# Patient Record
Sex: Female | Born: 1950 | Race: Black or African American | Hispanic: No | Smoking: Never smoker
Health system: Southern US, Community
[De-identification: ages and names within clinical notes are randomized; demographics above are authoritative.]

## PROBLEM LIST (undated history)

## (undated) DIAGNOSIS — M1990 Unspecified osteoarthritis, unspecified site: Secondary | ICD-10-CM

## (undated) DIAGNOSIS — I4891 Unspecified atrial fibrillation: Secondary | ICD-10-CM

## (undated) DIAGNOSIS — I509 Heart failure, unspecified: Secondary | ICD-10-CM

## (undated) DIAGNOSIS — E785 Hyperlipidemia, unspecified: Secondary | ICD-10-CM

## (undated) DIAGNOSIS — I10 Essential (primary) hypertension: Secondary | ICD-10-CM

## (undated) HISTORY — DX: Heart failure, unspecified: I50.9

## (undated) SURGERY — Surgical Case
Anesthesia: *Unknown

---

## 2006-08-07 ENCOUNTER — Ambulatory Visit: Payer: Self-pay | Admitting: Family Medicine

## 2007-07-27 ENCOUNTER — Emergency Department: Payer: Self-pay | Admitting: Emergency Medicine

## 2008-06-08 ENCOUNTER — Emergency Department: Payer: Self-pay | Admitting: Emergency Medicine

## 2008-08-18 ENCOUNTER — Emergency Department: Payer: Self-pay | Admitting: Emergency Medicine

## 2008-09-09 ENCOUNTER — Observation Stay: Payer: Self-pay | Admitting: Specialist

## 2008-09-19 ENCOUNTER — Ambulatory Visit: Payer: Self-pay

## 2009-03-11 ENCOUNTER — Emergency Department: Payer: Self-pay | Admitting: Emergency Medicine

## 2009-07-08 ENCOUNTER — Emergency Department: Payer: Self-pay | Admitting: Emergency Medicine

## 2010-02-28 ENCOUNTER — Emergency Department: Payer: Self-pay | Admitting: Emergency Medicine

## 2016-05-01 ENCOUNTER — Inpatient Hospital Stay
Admission: EM | Admit: 2016-05-01 | Discharge: 2016-05-07 | DRG: 280 | Disposition: A | Discharge status: Home / Self Care | Payer: Medicare HMO | Attending: Internal Medicine | Admitting: Internal Medicine

## 2016-05-01 ENCOUNTER — Inpatient Hospital Stay
Admission: EM | Admit: 2016-05-01 | Discharge: 2016-05-01 | Disposition: A | Discharge status: Home / Self Care | Payer: Medicare HMO | Source: Home / Self Care | Attending: Registered Nurse | Admitting: Registered Nurse

## 2016-05-01 ENCOUNTER — Emergency Department: Payer: Medicare HMO

## 2016-05-01 ENCOUNTER — Encounter: Payer: Self-pay | Admitting: Emergency Medicine

## 2016-05-01 ENCOUNTER — Other Ambulatory Visit: Payer: Self-pay

## 2016-05-01 DIAGNOSIS — R0603 Acute respiratory distress: Secondary | ICD-10-CM | POA: Diagnosis present

## 2016-05-01 DIAGNOSIS — Z8249 Family history of ischemic heart disease and other diseases of the circulatory system: Secondary | ICD-10-CM | POA: Diagnosis not present

## 2016-05-01 DIAGNOSIS — I4891 Unspecified atrial fibrillation: Secondary | ICD-10-CM | POA: Diagnosis present

## 2016-05-01 DIAGNOSIS — I110 Hypertensive heart disease with heart failure: Secondary | ICD-10-CM | POA: Diagnosis present

## 2016-05-01 DIAGNOSIS — Z79899 Other long term (current) drug therapy: Secondary | ICD-10-CM | POA: Diagnosis not present

## 2016-05-01 DIAGNOSIS — R000 Tachycardia, unspecified: Secondary | ICD-10-CM | POA: Diagnosis present

## 2016-05-01 DIAGNOSIS — I429 Cardiomyopathy, unspecified: Secondary | ICD-10-CM | POA: Diagnosis present

## 2016-05-01 DIAGNOSIS — Z9114 Patient's other noncompliance with medication regimen: Secondary | ICD-10-CM

## 2016-05-01 DIAGNOSIS — J9601 Acute respiratory failure with hypoxia: Secondary | ICD-10-CM | POA: Diagnosis present

## 2016-05-01 DIAGNOSIS — I447 Left bundle-branch block, unspecified: Secondary | ICD-10-CM | POA: Diagnosis present

## 2016-05-01 DIAGNOSIS — I161 Hypertensive emergency: Secondary | ICD-10-CM | POA: Diagnosis present

## 2016-05-01 DIAGNOSIS — E876 Hypokalemia: Secondary | ICD-10-CM | POA: Diagnosis present

## 2016-05-01 DIAGNOSIS — I214 Non-ST elevation (NSTEMI) myocardial infarction: Principal | ICD-10-CM | POA: Diagnosis present

## 2016-05-01 DIAGNOSIS — E119 Type 2 diabetes mellitus without complications: Secondary | ICD-10-CM | POA: Diagnosis present

## 2016-05-01 DIAGNOSIS — I5021 Acute systolic (congestive) heart failure: Secondary | ICD-10-CM | POA: Diagnosis present

## 2016-05-01 DIAGNOSIS — I2510 Atherosclerotic heart disease of native coronary artery without angina pectoris: Secondary | ICD-10-CM | POA: Diagnosis present

## 2016-05-01 DIAGNOSIS — J101 Influenza due to other identified influenza virus with other respiratory manifestations: Secondary | ICD-10-CM | POA: Diagnosis present

## 2016-05-01 DIAGNOSIS — Z7982 Long term (current) use of aspirin: Secondary | ICD-10-CM

## 2016-05-01 DIAGNOSIS — Z833 Family history of diabetes mellitus: Secondary | ICD-10-CM

## 2016-05-01 DIAGNOSIS — J9801 Acute bronchospasm: Secondary | ICD-10-CM | POA: Diagnosis present

## 2016-05-01 DIAGNOSIS — I509 Heart failure, unspecified: Secondary | ICD-10-CM

## 2016-05-01 HISTORY — DX: Essential (primary) hypertension: I10

## 2016-05-01 HISTORY — DX: Unspecified osteoarthritis, unspecified site: M19.90

## 2016-05-01 LAB — GLUCOSE, CAPILLARY
Glucose-Capillary: 147 mg/dL — ABNORMAL HIGH (ref 65–99)
Glucose-Capillary: 194 mg/dL — ABNORMAL HIGH (ref 65–99)
Glucose-Capillary: 105 mg/dL — ABNORMAL HIGH (ref 65–99)

## 2016-05-01 LAB — BASIC METABOLIC PANEL
Anion gap: 10 (ref 5–15)
BUN: 18 mg/dL (ref 6–20)
CALCIUM: 8.5 mg/dL — AB (ref 8.9–10.3)
CO2: 23 mmol/L (ref 22–32)
CREATININE: 0.94 mg/dL (ref 0.44–1.00)
Chloride: 106 mmol/L (ref 101–111)
GFR calc Af Amer: >60 mL/min (ref >60)
GLUCOSE: 193 mg/dL — AB (ref 65–99)
POTASSIUM: 3.2 mmol/L — AB (ref 3.5–5.1)
SODIUM: 139 mmol/L (ref 135–145)

## 2016-05-01 LAB — URINALYSIS, COMPLETE (UACMP) WITH MICROSCOPIC
BILIRUBIN URINE: NEGATIVE
GLUCOSE, UA: NEGATIVE mg/dL
Hgb urine dipstick: NEGATIVE
KETONES UR: NEGATIVE mg/dL
NITRITE: NEGATIVE
PH: 5 (ref 5.0–8.0)
Protein, ur: NEGATIVE mg/dL
Specific Gravity, Urine: 1.009 (ref 1.005–1.030)

## 2016-05-01 LAB — CBC WITH DIFFERENTIAL/PLATELET
BASOS PCT: 0 %
Basophils Absolute: 0.1 10*3/uL (ref 0–0.1)
EOS ABS: 0 10*3/uL (ref 0–0.7)
EOS PCT: 0 %
HCT: 39.1 % (ref 35.0–47.0)
Hemoglobin: 13.2 g/dL (ref 12.0–16.0)
Lymphocytes Relative: 2 %
Lymphs Abs: 0.3 10*3/uL — ABNORMAL LOW (ref 1.0–3.6)
MCH: 29 pg (ref 26.0–34.0)
MCHC: 33.8 g/dL (ref 32.0–36.0)
MCV: 85.6 fL (ref 80.0–100.0)
MONO ABS: 0.8 10*3/uL (ref 0.2–0.9)
Monocytes Relative: 5 %
Neutro Abs: 15.8 10*3/uL — ABNORMAL HIGH (ref 1.4–6.5)
Neutrophils Relative %: 93 %
PLATELETS: 238 10*3/uL (ref 150–440)
RBC: 4.57 MIL/uL (ref 3.80–5.20)
RDW: 14.5 % (ref 11.5–14.5)
WBC: 16.9 10*3/uL — ABNORMAL HIGH (ref 3.6–11.0)

## 2016-05-01 LAB — PROTIME-INR
INR: 1.13
Prothrombin Time: 14.6 seconds (ref 11.4–15.2)

## 2016-05-01 LAB — ECHOCARDIOGRAM COMPLETE
Height: 66 in
WEIGHTICAEL: 3456 [oz_av]

## 2016-05-01 LAB — HEPARIN LEVEL (UNFRACTIONATED)
HEPARIN UNFRACTIONATED: 0.54 [IU]/mL (ref 0.30–0.70)
Heparin Unfractionated: 0.63 IU/mL (ref 0.30–0.70)

## 2016-05-01 LAB — TROPONIN I
TROPONIN I: 0.19 ng/mL — AB (ref <0.03)
TROPONIN I: 3.51 ng/mL — AB (ref <0.03)
Troponin I: 3.52 ng/mL (ref <0.03)

## 2016-05-01 LAB — BRAIN NATRIURETIC PEPTIDE: B Natriuretic Peptide: 367 pg/mL — ABNORMAL HIGH (ref 0.0–100.0)

## 2016-05-01 LAB — APTT: APTT: 32 s (ref 24–36)

## 2016-05-01 LAB — MAGNESIUM: Magnesium: 1.6 mg/dL — ABNORMAL LOW (ref 1.7–2.4)

## 2016-05-01 MED ORDER — NITROGLYCERIN 2 % TD OINT
TOPICAL_OINTMENT | TRANSDERMAL | Status: AC
  Administered 2016-05-01: 0.5 [in_us] via TOPICAL
  Filled 2016-05-01: qty 1

## 2016-05-01 MED ORDER — NITROGLYCERIN 2 % TD OINT
0.5000 [in_us] | TOPICAL_OINTMENT | Freq: Once | TRANSDERMAL | Status: AC
Start: 2016-05-01 — End: 2016-05-01
  Administered 2016-05-01: 0.5 [in_us] via TOPICAL

## 2016-05-01 MED ORDER — SODIUM CHLORIDE 0.9 % IV SOLN: 250.0000 mL | INTRAVENOUS | Status: DC | PRN

## 2016-05-01 MED ORDER — ACETAMINOPHEN 650 MG RE SUPP: 650.0000 mg | Freq: Four times a day (QID) | RECTAL | Status: DC | PRN

## 2016-05-01 MED ORDER — DILTIAZEM HCL 25 MG/5ML IV SOLN: 10.0000 mg | Freq: Once | INTRAVENOUS | Status: DC

## 2016-05-01 MED ORDER — ONDANSETRON HCL 4 MG/2ML IJ SOLN: 4.0000 mg | Freq: Four times a day (QID) | INTRAMUSCULAR | Status: DC | PRN

## 2016-05-01 MED ORDER — FUROSEMIDE 10 MG/ML IJ SOLN
40.0000 mg | Freq: Once | INTRAMUSCULAR | Status: AC
  Administered 2016-05-01: 40 mg via INTRAVENOUS
  Filled 2016-05-01: qty 4

## 2016-05-01 MED ORDER — POTASSIUM CHLORIDE CRYS ER 20 MEQ PO TBCR
40.0000 meq | EXTENDED_RELEASE_TABLET | Freq: Once | ORAL | Status: AC
  Administered 2016-05-01: 40 meq via ORAL
  Filled 2016-05-01: qty 2

## 2016-05-01 MED ORDER — FUROSEMIDE 10 MG/ML IJ SOLN
40.0000 mg | Freq: Two times a day (BID) | INTRAMUSCULAR | Status: DC
  Administered 2016-05-01 – 2016-05-03 (×3): 40 mg via INTRAVENOUS
  Filled 2016-05-01 (×5): qty 4

## 2016-05-01 MED ORDER — HYDROCODONE-ACETAMINOPHEN 5-325 MG PO TABS: 1.0000 | ORAL_TABLET | ORAL | Status: DC | PRN

## 2016-05-01 MED ORDER — INSULIN ASPART 100 UNIT/ML ~~LOC~~ SOLN
0.0000 [IU] | Freq: Three times a day (TID) | SUBCUTANEOUS | Status: DC
Start: 2016-05-01 — End: 2016-05-07
  Administered 2016-05-01 – 2016-05-03 (×2): 2 [IU] via SUBCUTANEOUS
  Administered 2016-05-03: 3 [IU] via SUBCUTANEOUS
  Administered 2016-05-04 – 2016-05-05 (×3): 2 [IU] via SUBCUTANEOUS
  Administered 2016-05-05: 3 [IU] via SUBCUTANEOUS
  Filled 2016-05-01 (×3): qty 2
  Filled 2016-05-01: qty 3
  Filled 2016-05-01: qty 2
  Filled 2016-05-01 (×2): qty 3

## 2016-05-01 MED ORDER — BISACODYL 5 MG PO TBEC: 5.0000 mg | DELAYED_RELEASE_TABLET | Freq: Every day | ORAL | Status: DC | PRN

## 2016-05-01 MED ORDER — HEPARIN (PORCINE) IN NACL 100-0.45 UNIT/ML-% IJ SOLN
900.0000 [IU]/h | INTRAMUSCULAR | Status: DC
  Administered 2016-05-01 – 2016-05-02 (×2): 1150 [IU]/h via INTRAVENOUS
  Administered 2016-05-05 – 2016-05-06 (×2): 900 [IU]/h via INTRAVENOUS
  Filled 2016-05-01 (×6): qty 250

## 2016-05-01 MED ORDER — METOPROLOL SUCCINATE ER 25 MG PO TB24
25.0000 mg | ORAL_TABLET | Freq: Every day | ORAL | Status: DC
  Administered 2016-05-01 – 2016-05-02 (×2): 25 mg via ORAL
  Filled 2016-05-01 (×2): qty 1

## 2016-05-01 MED ORDER — INSULIN ASPART 100 UNIT/ML ~~LOC~~ SOLN
0.0000 [IU] | Freq: Every day | SUBCUTANEOUS | Status: DC
Start: 2016-05-01 — End: 2016-05-07

## 2016-05-01 MED ORDER — HEPARIN BOLUS VIA INFUSION
4000.0000 [IU] | Freq: Once | INTRAVENOUS | Status: AC
  Administered 2016-05-01: 4000 [IU] via INTRAVENOUS
  Filled 2016-05-01: qty 4000

## 2016-05-01 MED ORDER — SODIUM CHLORIDE 0.9% FLUSH: 3.0000 mL | INTRAVENOUS | Status: DC | PRN

## 2016-05-01 MED ORDER — SODIUM CHLORIDE 0.9% FLUSH
3.0000 mL | Freq: Two times a day (BID) | INTRAVENOUS | Status: DC
  Administered 2016-05-01 – 2016-05-07 (×8): 3 mL via INTRAVENOUS

## 2016-05-01 MED ORDER — ONDANSETRON HCL 4 MG PO TABS: 4.0000 mg | ORAL_TABLET | Freq: Four times a day (QID) | ORAL | Status: DC | PRN

## 2016-05-01 MED ORDER — ALBUTEROL SULFATE (2.5 MG/3ML) 0.083% IN NEBU: 2.5000 mg | INHALATION_SOLUTION | RESPIRATORY_TRACT | Status: DC | PRN

## 2016-05-01 MED ORDER — SODIUM CHLORIDE 0.9% FLUSH
3.0000 mL | Freq: Two times a day (BID) | INTRAVENOUS | Status: DC
  Administered 2016-05-01 – 2016-05-07 (×9): 3 mL via INTRAVENOUS

## 2016-05-01 MED ORDER — GUAIFENESIN-DM 100-10 MG/5ML PO SYRP
5.0000 mL | ORAL_SOLUTION | ORAL | Status: DC | PRN
  Administered 2016-05-01 – 2016-05-05 (×4): 5 mL via ORAL
  Filled 2016-05-01 (×4): qty 5

## 2016-05-01 MED ORDER — SENNOSIDES-DOCUSATE SODIUM 8.6-50 MG PO TABS: 1.0000 | ORAL_TABLET | Freq: Every evening | ORAL | Status: DC | PRN

## 2016-05-01 MED ORDER — KETOROLAC TROMETHAMINE 15 MG/ML IJ SOLN: 15.0000 mg | Freq: Four times a day (QID) | INTRAMUSCULAR | Status: DC | PRN

## 2016-05-01 MED ORDER — LISINOPRIL 10 MG PO TABS
10.0000 mg | ORAL_TABLET | Freq: Every day | ORAL | Status: DC
  Administered 2016-05-01 – 2016-05-06 (×6): 10 mg via ORAL
  Filled 2016-05-01 (×6): qty 1

## 2016-05-01 MED ORDER — HYDRALAZINE HCL 20 MG/ML IJ SOLN: 10.0000 mg | Freq: Four times a day (QID) | INTRAMUSCULAR | Status: DC | PRN

## 2016-05-01 MED ORDER — ACETAMINOPHEN 325 MG PO TABS
650.0000 mg | ORAL_TABLET | Freq: Four times a day (QID) | ORAL | Status: DC | PRN
Start: 2016-05-01 — End: 2016-05-06
  Administered 2016-05-02: 650 mg via ORAL

## 2016-05-01 MED ORDER — HYDRALAZINE HCL 50 MG PO TABS
50.0000 mg | ORAL_TABLET | Freq: Three times a day (TID) | ORAL | Status: DC
  Administered 2016-05-01 – 2016-05-03 (×7): 50 mg via ORAL
  Filled 2016-05-01 (×7): qty 1

## 2016-05-01 NOTE — ED Notes (Signed)
Pt leaving Emergency department at this time. Tech and RN with pt. Family following pt to room. Belongings with family.

## 2016-05-01 NOTE — Progress Notes (Deleted)
ANTICOAGULATION CONSULT NOTE - Initial Consult  Pharmacy Consult for heparin drip  Indication: atrial fibrillation  No Known Allergies  Patient Measurements: Height: 5\' 6"  (167.6 cm) Weight: 216 lb (98 kg) IBW/kg (Calculated) : 59.3  Height: 5 foot 6 inches per RN/patient Heparin Dosing Weight: 81 kg  Vital Signs: Temp: 98.7 F (37.1 C) (03/14 1100) Temp Source: Oral (03/14 1100) BP: 159/81 (03/14 1708) Pulse Rate: 98 (03/14 1708)  Labs:  Recent Labs  05/01/16 0616 05/01/16 0715 05/01/16 0930 05/01/16 1241 05/01/16 1621  HGB  --  13.2  --   --   --   HCT  --  39.1  --   --   --   PLT  --  238  --   --   --   APTT  --   --  32  --   --   LABPROT  --   --  14.6  --   --   INR  --   --  1.13  --   --   HEPARINUNFRC  --   --   --   --  0.54  CREATININE 0.94  --   --   --   --   TROPONINI 0.19*  --   --  3.51*  --     Estimated Creatinine Clearance: 70.5 mL/min (by C-G formula based on SCr of 0.94 mg/dL).   Medical History: Past Medical History:  Diagnosis Date  . Arthritis   . Hypertension    Medications:  Prescriptions Prior to Admission  Medication Sig Dispense Refill Last Dose  . aspirin EC 81 MG tablet Take 81 mg by mouth daily.   04/30/2016 at am  . olmesartan-hydrochlorothiazide (BENICAR HCT) 40-25 MG tablet Take 1 tablet by mouth daily.   Past Month at Unknown time   Scheduled:  . diltiazem  10 mg Intravenous Once  . furosemide  40 mg Intravenous BID  . hydrALAZINE  50 mg Oral Q8H  . insulin aspart  0-5 Units Subcutaneous QHS  . insulin aspart  0-9 Units Subcutaneous TID WC  . lisinopril  10 mg Oral Daily  . metoprolol succinate  25 mg Oral Daily  . sodium chloride flush  3 mL Intravenous Q12H  . sodium chloride flush  3 mL Intravenous Q12H   Infusions:  . heparin 1,150 Units/hr (05/01/16 1027)    Assessment: Pharmacy consulted to dose and monitor heparin drip in this 66 year old female admitted with new onset atrial fibrillation. Obtained  patient's height from RN/patient. Patient was not taking anticoagulation prior to admission.  Goal of Therapy:  Heparin level 0.3-0.7 units/ml Monitor platelets by anticoagulation protocol: Yes   Plan:  Give 4000 units bolus x 1 Start heparin infusion at 1150 units/hr Check anti-Xa level in 6 hours and daily while on heparin Continue to monitor H&H and platelets  3/14 1638 HL therapeutic x 1. Continue current rate. Will recheck HL in 6 hours.  Laural Benes, Pharm.D., BCPS Clinical Pharmacist 05/01/2016,5:09 PM

## 2016-05-01 NOTE — ED Triage Notes (Signed)
Patient comes in from home via [facility] for difficulty breathing. On Arrival EMS got 70% Ra, after 3 albuterol breathing treatments 80's%. Patient was placed on CPAP 95%. Patient also got Solumedrol 125mg  en route also. Patient also hypertensive 219/140's per EMS. Patient has hx of htn. Denies hx of lung problems, CHF.

## 2016-05-01 NOTE — Progress Notes (Signed)
ANTICOAGULATION CONSULT NOTE - Initial Consult  Pharmacy Consult for heparin drip  Indication: atrial fibrillation  No Known Allergies  Patient Measurements: Weight: 216 lb (98 kg)  Height: 5 foot 6 inches per RN/patient Heparin Dosing Weight: 81 kg  Vital Signs: Temp: 98.6 F (37 C) (03/14 0936) Temp Source: Oral (03/14 0936) BP: 155/88 (03/14 0936) Pulse Rate: 93 (03/14 0936)  Labs:  Recent Labs  05/01/16 0616 05/01/16 0715  HGB  --  13.2  HCT  --  39.1  PLT  --  238  CREATININE 0.94  --   TROPONINI 0.19*  --     CrCl cannot be calculated (Unknown ideal weight.).   Medical History: Past Medical History:  Diagnosis Date  . Arthritis   . Hypertension    Medications:  Prescriptions Prior to Admission  Medication Sig Dispense Refill Last Dose  . aspirin EC 81 MG tablet Take 81 mg by mouth daily.   04/30/2016 at am  . olmesartan-hydrochlorothiazide (BENICAR HCT) 40-25 MG tablet Take 1 tablet by mouth daily.   Past Month at Unknown time   Scheduled:  . diltiazem  10 mg Intravenous Once  . furosemide  40 mg Intravenous BID  . heparin  4,000 Units Intravenous Once  . insulin aspart  0-5 Units Subcutaneous QHS  . insulin aspart  0-9 Units Subcutaneous TID WC  . lisinopril  10 mg Oral Daily  . metoprolol succinate  25 mg Oral Daily  . potassium chloride  40 mEq Oral Once  . sodium chloride flush  3 mL Intravenous Q12H  . sodium chloride flush  3 mL Intravenous Q12H   Infusions:  . heparin      Assessment: Pharmacy consulted to dose and monitor heparin drip in this 66 year old female admitted with new onset atrial fibrillation. Obtained patient's height from RN/patient. Patient was not taking anticoagulation prior to admission.  Goal of Therapy:  Heparin level 0.3-0.7 units/ml Monitor platelets by anticoagulation protocol: Yes   Plan:  Give 4000 units bolus x 1 Start heparin infusion at 1150 units/hr Check anti-Xa level in 6 hours and daily while on  heparin Continue to monitor H&H and platelets  Lenis Noon, PharmD Clinical Pharmacist 05/01/2016,9:59 AM

## 2016-05-01 NOTE — Progress Notes (Signed)
Troponin went up to 3.51.  Dr. Humphrey Rolls notified.  No new orders.  Patient on heparin gtt and is chest pain free.

## 2016-05-01 NOTE — H&P (Signed)
[place] [facility] [place] NAME: Mackenzie [name]    MR#:  [id number]  DATE OF BIRTH:  Jul 01, 1950  DATE OF ADMISSION:  05/01/2016  PRIMARY CARE PHYSICIAN: No PCP Per Patient   REQUESTING/REFERRING PHYSICIAN: Gregor Hams, MD  CHIEF COMPLAINT:   Chief Complaint  Patient presents with  . Respiratory Distress   Dyspnea for 2 months, worsening today. HISTORY OF PRESENT ILLNESS:  Mackenzie [name]  is a 66 y.o. female with a known history of Hypertension not on hypertension medication. She said that she has had dyspnea on exertion for the past 2-3 months. Shortness breasts has been worsening today. She also complains of cough, wheezing, orthopnea and nocturnal dyspnea. She denies any chest pain or leg swelling. She was found hypoxia and put on BiPAP in the ED. Chest x-ray show pulmonary edema. She was treated with Lasix one dose in the ED, she is off BiPAP now, on oxygen by nasal cannula.  PAST MEDICAL HISTORY:   Past Medical History:  Diagnosis Date  . Arthritis   . Hypertension     PAST SURGICAL HISTORY:  History reviewed. No pertinent surgical history. no.  SOCIAL HISTORY:   Social History  Substance Use Topics  . Smoking status: Never Smoker  . Smokeless tobacco: Never Used  . Alcohol use No    FAMILY HISTORY:   Family History  Problem Relation Age of Onset  . Diabetes Mother   . Heart disease Mother   . Heart attack Father   . Diabetes Brother   . Breast cancer Maternal Aunt   . Breast cancer Maternal Grandmother     DRUG ALLERGIES:  No Known Allergies  REVIEW OF SYSTEMS:   Review of Systems  Constitutional: Positive for malaise/fatigue. Negative for chills, fever and weight loss.  HENT: Negative for congestion and sore throat.   Eyes: Negative for blurred vision and double vision.  Respiratory: Positive for cough, shortness of breath and wheezing. Negative for hemoptysis, sputum production and stridor.   Cardiovascular:  Positive for palpitations and orthopnea. Negative for chest pain and leg swelling.  Gastrointestinal: Negative for abdominal pain, blood in stool, constipation, diarrhea, melena, nausea and vomiting.  Genitourinary: Negative for dysuria, hematuria and urgency.  Musculoskeletal: Negative for joint pain.  Skin: Negative for itching and rash.  Neurological: Positive for weakness. Negative for dizziness, focal weakness and loss of consciousness.  Psychiatric/Behavioral: Negative for depression. The patient is not nervous/anxious.     MEDICATIONS AT HOME:   Prior to Admission medications   Medication Sig Start Date End Date Taking? Authorizing Provider  aspirin EC 81 MG tablet Take 81 mg by mouth daily.   Yes Historical Provider, MD  olmesartan-hydrochlorothiazide (BENICAR HCT) 40-25 MG tablet Take 1 tablet by mouth daily.   Yes Historical Provider, MD      VITAL SIGNS:  Blood pressure (!) 162/91, pulse 100, resp. rate (!) 29, weight 216 lb (98 kg), SpO2 95 %.  PHYSICAL EXAMINATION:  Physical Exam  Constitutional: She is oriented to person, place, and time and well-developed, well-nourished, and in no distress.  HENT:  Head: Normocephalic.  Mouth/Throat: Oropharynx is clear and moist.  Eyes: Conjunctivae and EOM are normal.  Neck: Neck supple. No JVD present. No tracheal deviation present.  Cardiovascular: Normal rate, regular rhythm and normal heart sounds.  Exam reveals no gallop.   No murmur heard. Pulmonary/Chest: Effort normal. No respiratory distress. She has no wheezes. She has rales.  Abdominal: Soft. Bowel  sounds are normal. She exhibits no distension. There is no tenderness.  Musculoskeletal: Normal range of motion. She exhibits edema.  Neurological: She is alert and oriented to person, place, and time. No cranial nerve deficit.  Skin: No rash noted. No erythema.  Psychiatric: Affect and judgment normal.   LABORATORY PANEL:   CBC  Recent Labs Lab 05/01/16 0715  WBC  16.9*  HGB 13.2  HCT 39.1  PLT 238   ------------------------------------------------------------------------------------------------------------------  Chemistries   Recent Labs Lab 05/01/16 0616  NA 139  K 3.2*  CL 106  CO2 23  GLUCOSE 193*  BUN 18  CREATININE 0.94  CALCIUM 8.5*   ------------------------------------------------------------------------------------------------------------------  Cardiac Enzymes  Recent Labs Lab 05/01/16 0616  TROPONINI 0.19*   ------------------------------------------------------------------------------------------------------------------  RADIOLOGY:  Dg Chest Port 1 View  Result Date: 05/01/2016 CLINICAL DATA:  Difficulty breathing, hypoxic. History of hypertension, CHF. EXAM: PORTABLE CHEST 1 VIEW COMPARISON:  Chest radiograph September 09, 2008 FINDINGS: Cardiac silhouette is mild to moderately enlarged, increased from prior examination. Pulmonary vascular congestion and interstitial prominence. Small RIGHT pleural effusion. No pneumothorax. Degenerative change of the thoracic spine. Marked spurring LEFT humeral head. IMPRESSION: Cardiomegaly and findings of interstitial edema with small pleural effusions. Electronically Signed   By: Elon Alas M.D.   On: 05/01/2016 06:43      IMPRESSION AND PLAN:   Acute respiratory failure with hypoxia due to acute CHF. The patient will be admitted to telemetry floor. Nebulizer when necessary. Start CHF protocol, Lasix 40 mg IV twice a day, echocardiogram, cardiology consult.  A. fib with RVR. New-onset. I will start heparin drip, start Lopressor, Cardizem IV 1 dose now, echocardiogram and cardiology consult.  Accelerated hypertension. Start Lopressor and lisinopril, IV hydralazine when necessary.  Elevated troponin, due to above. Start heparin drip, aspirin and Lipitor.  Hypokalemia. Give potassium supplement and check magnesium level.  Leukocytosis. Unclear etiology. Possible due to  reaction. Check urinalysis, Follow-up CBC.  History of diabetes. Check hemoglobin A1c and lipid panel, and start sliding scale.  All the records are reviewed and case discussed with ED provider. Management plans discussed with the patient, family and they are in agreement.  CODE STATUS: Full code  TOTAL TIME TAKING CARE OF THIS PATIENT: 58 minutes.    Demetrios Loll M.D on 05/01/2016 at 7:59 AM  Between 7am to 6pm - Pager - [phone]  After 6pm go to [email or web address] - [employer] [facility]  Office  [phone]  CC: Primary care physician; No PCP Per Patient   Note: This dictation was prepared with Dragon dictation along with smaller phrase technology. Any transcriptional errors that result from this process are unintentional.

## 2016-05-01 NOTE — Care Management Note (Signed)
Case Management Note  Patient Details  Name: LUCCIANA HEAD MRN: [id number] Date of Birth: [birth year]  Subjective/Objective:          Admitted from home with CHF and new onset of atrial fib.  Current 02 requirements acute. Referral to HF clinic.          Action/Plan:   Expected Discharge Date:                  Expected Discharge Plan:     In-House Referral:     Discharge planning Services     Post Acute Care Choice:    Choice offered to:     DME Arranged:    DME Agency:     HH Arranged:    HH Agency:     Status of Service:     If discussed at [employer] of Stay Meetings, dates discussed:    Additional Comments:  Katrina Stack, RN 05/01/2016, 11:38 AM

## 2016-05-01 NOTE — ED Notes (Signed)
Pt assisted to bedside commode. Family at bedside.

## 2016-05-01 NOTE — Progress Notes (Signed)
Mackenzie [name] is a 66 y.o. female  [id number]  Primary Cardiologist:Dr. Neoma Laming   Reason for Consultation: Elevated troponin  HPI: 66yo black female presented to ER with severe dyspnea. She reported she was having worsening dyspnea on exertion over the past 2-62months. She was placed on CPAP. She has a history of uncontrolled hypertension and was not taking her BP medication. Troponin was elevated to 0.19 in the ED and was admitted for observation and treatment of HTN.    Review of Systems: No chest pain. Shortness of breath his improved significantly. Remains on nasal cannula.   Past Medical History:  Diagnosis Date  . Arthritis   . Hypertension     Medications Prior to Admission  Medication Sig Dispense Refill  . aspirin EC 81 MG tablet Take 81 mg by mouth daily.    Marland Kitchen olmesartan-hydrochlorothiazide (BENICAR HCT) 40-25 MG tablet Take 1 tablet by mouth daily.       Marland Kitchen diltiazem  10 mg Intravenous Once  . furosemide  40 mg Intravenous BID  . insulin aspart  0-5 Units Subcutaneous QHS  . insulin aspart  0-9 Units Subcutaneous TID WC  . lisinopril  10 mg Oral Daily  . metoprolol succinate  25 mg Oral Daily  . potassium chloride  40 mEq Oral Once  . sodium chloride flush  3 mL Intravenous Q12H  . sodium chloride flush  3 mL Intravenous Q12H    Infusions:   No Known Allergies  Social History   Social History  . Marital status: Widowed    Spouse name: N/A  . Number of children: N/A  . Years of education: N/A   Occupational History  . Not on file.   Social History Main Topics  . Smoking status: Never Smoker  . Smokeless tobacco: Never Used  . Alcohol use No  . Drug use: No  . Sexual activity: Not on file   Other Topics Concern  . Not on file   Social History Narrative  . No narrative on file    Family History  Problem Relation Age of Onset  . Diabetes Mother   . Heart disease Mother   . Heart attack Father   . Diabetes Brother   . Breast cancer  Maternal Aunt   . Breast cancer Maternal Grandmother     PHYSICAL EXAM: Vitals:   05/01/16 0845 05/01/16 0936  BP: (!) 182/84 (!) 155/88  Pulse: 100 93  Resp: (!) 29   Temp:  98.6 F (37 C)    No intake or output data in the 24 hours ending 05/01/16 0937  General:  Well appearing. Mild shortness of breath on nasal canual.  HEENT: normal Neck: supple. no JVD. Carotids 2+ bilat; no bruits. No lymphadenopathy or thryomegaly appreciated. Cor: PMI nondisplaced. Regular rate & rhythm. No rubs, gallops or murmurs. Lungs: Congested bilaterally.  Abdomen: soft, nontender, nondistended. No hepatosplenomegaly. No bruits or masses. Good bowel sounds. Extremities: no cyanosis, clubbing, rash, edema Neuro: alert & oriented x 3, cranial nerves grossly intact. moves all 4 extremities w/o difficulty. Affect pleasant.  ECG: NSR 90bpm  Results for orders placed or performed during the hospital encounter of 05/01/16 (from the past 24 hour(s))  Basic metabolic panel     Status: Abnormal   Collection Time: 05/01/16  6:16 AM  Result Value Ref Range   Sodium 139 135 - 145 mmol/L   Potassium 3.2 (L) 3.5 - 5.1 mmol/L   Chloride 106 101 - 111 mmol/L  CO2 23 22 - 32 mmol/L   Glucose, Bld 193 (H) 65 - 99 mg/dL   BUN 18 6 - 20 mg/dL   Creatinine, Ser 0.94 0.44 - 1.00 mg/dL   Calcium 8.5 (L) 8.9 - 10.3 mg/dL   GFR calc non Af Amer >60 >60 mL/min   GFR calc Af Amer >60 >60 mL/min   Anion gap 10 5 - 15  Troponin I     Status: Abnormal   Collection Time: 05/01/16  6:16 AM  Result Value Ref Range   Troponin I 0.19 (HH) <0.03 ng/mL  CBC with Differential     Status: Abnormal   Collection Time: 05/01/16  7:15 AM  Result Value Ref Range   WBC 16.9 (H) 3.6 - 11.0 K/uL   RBC 4.57 3.80 - 5.20 MIL/uL   Hemoglobin 13.2 12.0 - 16.0 g/dL   HCT 39.1 35.0 - 47.0 %   MCV 85.6 80.0 - 100.0 fL   MCH 29.0 26.0 - 34.0 pg   MCHC 33.8 32.0 - 36.0 g/dL   RDW 14.5 11.5 - 14.5 %   Platelets 238 150 - 440 K/uL    Neutrophils Relative % 93 %   Neutro Abs 15.8 (H) 1.4 - 6.5 K/uL   Lymphocytes Relative 2 %   Lymphs Abs 0.3 (L) 1.0 - 3.6 K/uL   Monocytes Relative 5 %   Monocytes Absolute 0.8 0.2 - 0.9 K/uL   Eosinophils Relative 0 %   Eosinophils Absolute 0.0 0 - 0.7 K/uL   Basophils Relative 0 %   Basophils Absolute 0.1 0 - 0.1 K/uL  Brain natriuretic peptide     Status: Abnormal   Collection Time: 05/01/16  7:15 AM  Result Value Ref Range   B Natriuretic Peptide 367.0 (H) 0.0 - 100.0 pg/mL   Dg Chest Port 1 View  Result Date: 05/01/2016 CLINICAL DATA:  Difficulty breathing, hypoxic. History of hypertension, CHF. EXAM: PORTABLE CHEST 1 VIEW COMPARISON:  Chest radiograph September 09, 2008 FINDINGS: Cardiac silhouette is mild to moderately enlarged, increased from prior examination. Pulmonary vascular congestion and interstitial prominence. Small RIGHT pleural effusion. No pneumothorax. Degenerative change of the thoracic spine. Marked spurring LEFT humeral head. IMPRESSION: Cardiomegaly and findings of interstitial edema with small pleural effusions. Electronically Signed   By: Elon Alas M.D.   On: 05/01/2016 06:43     ASSESSMENT AND PLAN: New onset acute congestive heart failure likely due to chronic uncontrolled hypertension. BNP is 367.  Troponin elevation is likely due to demand ischemia but will continue to monitor troponin.  Echo to evaluate heart function.  Lasix as ordered.  Metoprolol as ordered.  Start PO hydralazine as ordered.  May allow patient to eat and drink. Will continue to monitor patients status.    Jake Bathe

## 2016-05-01 NOTE — ED Provider Notes (Signed)
[facility] Emergency Department Provider Note   First MD Initiated Contact with Patient 05/01/16 [phone]     (approximate)  I have reviewed the triage vital signs and the nursing notes.   HISTORY  Chief Complaint Respiratory Distress    HPI Mackenzie [name] is a 67 y.o. female with history of uncontrolled hypertension presents to the emergency department  via EMS with respiratory distress CPAP in place. Per EMS on arrival patient's oxygen saturation was 70s". EMS stated that the patient's initial blood pressure was 219/140. Patient states that difficulty breathing began tonight. Patient denies any chest pain. Patient denies any lower extremity pain or swelling. Patient admits to known history of hypertension and admits to being noncompliant with medication.   Past Medical History:  Diagnosis Date  . Hypertension     There are no active problems to display for this patient.  Past surgical history No pertinent past surgical history  Prior to Admission medications   Not on File    Allergies No known drug allergies No family history on file.  Social History Social History  Substance Use Topics  . Smoking status: Never Smoker  . Smokeless tobacco: Never Used  . Alcohol use No    Review of Systems Constitutional: No fever/chills Eyes: No visual changes. ENT: No sore throat. Cardiovascular: Denies chest pain. Respiratory: Positive for shortness of breath. Gastrointestinal: No abdominal pain.  No nausea, no vomiting.  No diarrhea.  No constipation. Genitourinary: Negative for dysuria. Musculoskeletal: Negative for back pain. Skin: Negative for rash. Neurological: Negative for headaches, focal weakness or numbness.  10-point ROS otherwise negative.  ____________________________________________   PHYSICAL EXAM:  VITAL SIGNS: ED Triage Vitals  Enc Vitals Group     BP --      Pulse Rate 05/01/16 0615 (!) 132     Resp 05/01/16 0626 (!) 39       Temp --      Temp src --      SpO2 05/01/16 0615 98 %     Weight 05/01/16 0616 216 lb (98 kg)     Height --      Head Circumference --      Peak Flow --      Pain Score --      Pain Loc --      Pain Edu? --      Excl. in Onset? --     Constitutional: Alert and oriented. Apparent respiratory distress Eyes: Conjunctivae are normal. PERRL. EOMI. Head: Atraumatic. Mouth/Throat: Mucous membranes are moist.  Oropharynx non-erythematous. Neck: No stridor.   Cardiovascular: Tachycardia, regular rhythm. Good peripheral circulation. Grossly normal heart sounds. Respiratory: Tachypnea, positive accessory rest or muscle use, speaking in 3 word phrases. Bibasilar rales Gastrointestinal: Soft and nontender. No distention.  Musculoskeletal: No lower extremity tenderness nor edema. No gross deformities of extremities. Neurologic:  Normal speech and language. No gross focal neurologic deficits are appreciated.  Skin:  Skin is warm, dry and intact. No rash noted. Psychiatric: Anxious affect..   ____________________________________________   LABS (all labs ordered are listed, but only abnormal results are displayed)  Labs Reviewed  CBC WITH DIFFERENTIAL/PLATELET  BASIC METABOLIC PANEL  TROPONIN I   ____________________________________________  EKG  ED ECG REPORT I, [place] BR[place], the attending physician, personally viewed and interpreted this ECG.   Date: 05/01/2016  EKG Time: 6:26 AM  Rate: 120  Rhythm: Sinus tachycardia with left bundle branch block and LVH Intervals: Normal  ST&T Change: None  ____________________________________________  RADIOLOGY I, Gregor Hams, personally viewed and evaluated these images (plain radiographs) as part of my medical decision making, as well as reviewing the written report by the radiologist.  Dg Chest Port 1 View  Result Date: 05/01/2016 CLINICAL DATA:  Difficulty breathing, hypoxic. History of hypertension, CHF. EXAM: PORTABLE  CHEST 1 VIEW COMPARISON:  Chest radiograph September 09, 2008 FINDINGS: Cardiac silhouette is mild to moderately enlarged, increased from prior examination. Pulmonary vascular congestion and interstitial prominence. Small RIGHT pleural effusion. No pneumothorax. Degenerative change of the thoracic spine. Marked spurring LEFT humeral head. IMPRESSION: Cardiomegaly and findings of interstitial edema with small pleural effusions. Electronically Signed   By: Elon Alas M.D.   On: 05/01/2016 06:43     Procedures   Critical Care performed: CRITICAL CARE Performed by: Gregor Hams   Total critical care time: 40 minutes  Critical care time was exclusive of separately billable procedures and treating other patients.  Critical care was necessary to treat or prevent imminent or life-threatening deterioration.  Critical care was time spent personally by me on the following activities: development of treatment plan with patient and/or surrogate as well as nursing, discussions with consultants, evaluation of patient's response to treatment, examination of patient, obtaining history from patient or surrogate, ordering and performing treatments and interventions, ordering and review of laboratory studies, ordering and review of radiographic studies, pulse oximetry and re-evaluation of patient's condition.  ____________________________________________   INITIAL IMPRESSION / ASSESSMENT AND PLAN / ED COURSE  Pertinent labs & imaging results that were available during my care of the patient were reviewed by me and considered in my medical decision making (see chart for details).  Given history of physical exam BiPAP applied to the patient on arrival to the emergency department, Lasix 40 mg IV given, nitroglycerin ointment half inch applied.  Patient's blood pressure improved at this time systolic blood pressure 867. Respiratory rate, F for markedly improved. As such BiPAP will be removed at this time   Patient discussed with Dr. Marcille Blanco for hospital admission         ____________________________________________  FINAL CLINICAL IMPRESSION(S) / ED DIAGNOSES  Final diagnoses:  Hypertensive emergency  Respiratory distress  Left bundle branch block     MEDICATIONS GIVEN DURING THIS VISIT:  Medications  nitroGLYCERIN (NITROGLYN) 2 % ointment 0.5 inch (0.5 inches Topical Given 05/01/16 0610)  furosemide (LASIX) injection 40 mg (40 mg Intravenous Given 05/01/16 0641)     NEW OUTPATIENT MEDICATIONS STARTED DURING THIS VISIT:  New Prescriptions   No medications on file    Modified Medications   No medications on file    Discontinued Medications   No medications on file     Note:  This document was prepared using Dragon voice recognition software and may include unintentional dictation errors.    Gregor Hams, MD 05/01/16 [phone]

## 2016-05-01 NOTE — Progress Notes (Signed)
*  PRELIMINARY RESULTS* Echocardiogram 2D Echocardiogram has been performed.  Sherrie Sport 05/01/2016, 1:26 PM

## 2016-05-01 NOTE — Progress Notes (Signed)
ANTICOAGULATION CONSULT NOTE - Initial Consult  Pharmacy Consult for heparin drip  Indication: atrial fibrillation  No Known Allergies  Patient Measurements: Height: 5\' 6"  (167.6 cm) Weight: 216 lb (98 kg) IBW/kg (Calculated) : 59.3  Height: 5 foot 6 inches per RN/patient Heparin Dosing Weight: 81 kg  Vital Signs: Temp: 98.7 F (37.1 C) (03/14 1100) Temp Source: Oral (03/14 1100) BP: 159/85 (03/14 1100) Pulse Rate: 116 (03/14 1100)  Labs:  Recent Labs  05/01/16 0616 05/01/16 0715 05/01/16 0930 05/01/16 1241 05/01/16 1621  HGB  --  13.2  --   --   --   HCT  --  39.1  --   --   --   PLT  --  238  --   --   --   APTT  --   --  32  --   --   LABPROT  --   --  14.6  --   --   INR  --   --  1.13  --   --   HEPARINUNFRC  --   --   --   --  0.54  CREATININE 0.94  --   --   --   --   TROPONINI 0.19*  --   --  3.51*  --     Estimated Creatinine Clearance: 70.5 mL/min (by C-G formula based on SCr of 0.94 mg/dL).   Medical History: Past Medical History:  Diagnosis Date  . Arthritis   . Hypertension    Medications:  Prescriptions Prior to Admission  Medication Sig Dispense Refill Last Dose  . aspirin EC 81 MG tablet Take 81 mg by mouth daily.   04/30/2016 at am  . olmesartan-hydrochlorothiazide (BENICAR HCT) 40-25 MG tablet Take 1 tablet by mouth daily.   Past Month at Unknown time   Scheduled:  . diltiazem  10 mg Intravenous Once  . furosemide  40 mg Intravenous BID  . hydrALAZINE  50 mg Oral Q8H  . insulin aspart  0-5 Units Subcutaneous QHS  . insulin aspart  0-9 Units Subcutaneous TID WC  . lisinopril  10 mg Oral Daily  . metoprolol succinate  25 mg Oral Daily  . sodium chloride flush  3 mL Intravenous Q12H  . sodium chloride flush  3 mL Intravenous Q12H   Infusions:  . heparin 1,150 Units/hr (05/01/16 1027)    Assessment: Pharmacy consulted to dose and monitor heparin drip in this 66 year old female admitted with new onset atrial fibrillation. Obtained  patient's height from RN/patient. Patient was not taking anticoagulation prior to admission.  Goal of Therapy:  Heparin level 0.3-0.7 units/ml Monitor platelets by anticoagulation protocol: Yes   Plan:  0314 1621 HL 0.54- First level is therapeutic. Will continue heparin infusion at 1150units/hr and recheck heparin levels in 6 hours. If therapeutic will transition to daily HL and CBC monitoring.   Pernell Dupre, PharmD Clinical Pharmacist 05/01/2016,5:07 PM

## 2016-05-01 NOTE — Progress Notes (Signed)
Patient alert and oriented, vss, no complaints of pain. Heparin gtt at 11.5.  Family at bedside.  Resting comfortably.  Handoff given.

## 2016-05-01 NOTE — Progress Notes (Signed)
ANTICOAGULATION CONSULT NOTE - Initial Consult  Pharmacy Consult for heparin drip  Indication: atrial fibrillation  No Known Allergies  Patient Measurements: Height: 5\' 6"  (167.6 cm) Weight: 216 lb (98 kg) IBW/kg (Calculated) : 59.3  Height: 5 foot 6 inches per RN/patient Heparin Dosing Weight: 81 kg  Vital Signs: Temp: 98.2 F (36.8 C) (03/14 2031) Temp Source: Oral (03/14 2031) BP: 162/90 (03/14 2031) Pulse Rate: 97 (03/14 2031)  Labs:  Recent Labs  05/01/16 0616 05/01/16 0715 05/01/16 0930 05/01/16 1241 05/01/16 1621 05/01/16 2203  HGB  --  13.2  --   --   --   --   HCT  --  39.1  --   --   --   --   PLT  --  238  --   --   --   --   APTT  --   --  32  --   --   --   LABPROT  --   --  14.6  --   --   --   INR  --   --  1.13  --   --   --   HEPARINUNFRC  --   --   --   --  0.54 0.63  CREATININE 0.94  --   --   --   --   --   TROPONINI 0.19*  --   --  3.51* 3.52*  --     Estimated Creatinine Clearance: 70.5 mL/min (by C-G formula based on SCr of 0.94 mg/dL).   Medical History: Past Medical History:  Diagnosis Date  . Arthritis   . Hypertension    Medications:  Prescriptions Prior to Admission  Medication Sig Dispense Refill Last Dose  . aspirin EC 81 MG tablet Take 81 mg by mouth daily.   04/30/2016 at am  . olmesartan-hydrochlorothiazide (BENICAR HCT) 40-25 MG tablet Take 1 tablet by mouth daily.   Past Month at Unknown time   Scheduled:  . diltiazem  10 mg Intravenous Once  . furosemide  40 mg Intravenous BID  . hydrALAZINE  50 mg Oral Q8H  . insulin aspart  0-5 Units Subcutaneous QHS  . insulin aspart  0-9 Units Subcutaneous TID WC  . lisinopril  10 mg Oral Daily  . metoprolol succinate  25 mg Oral Daily  . sodium chloride flush  3 mL Intravenous Q12H  . sodium chloride flush  3 mL Intravenous Q12H   Infusions:  . heparin 1,150 Units/hr (05/01/16 1027)    Assessment: Pharmacy consulted to dose and monitor heparin drip in this 66 year old  female admitted with new onset atrial fibrillation. Obtained patient's height from RN/patient. Patient was not taking anticoagulation prior to admission.  Goal of Therapy:  Heparin level 0.3-0.7 units/ml Monitor platelets by anticoagulation protocol: Yes   Plan:  Continue current rate and recheck HL in 6 hours.   Ulice Dash D, Pharm.D., BCPS Clinical Pharmacist 05/01/2016,10:46 PM

## 2016-05-02 ENCOUNTER — Encounter
Admission: EM | Disposition: A | Discharge status: Home / Self Care | Payer: Self-pay | Source: Home / Self Care | Attending: Internal Medicine

## 2016-05-02 LAB — BASIC METABOLIC PANEL
ANION GAP: 9 (ref 5–15)
Anion gap: 12 (ref 5–15)
BUN: 25 mg/dL — ABNORMAL HIGH (ref 6–20)
BUN: 24 mg/dL — ABNORMAL HIGH (ref 6–20)
CALCIUM: 8.7 mg/dL — AB (ref 8.9–10.3)
CO2: 28 mmol/L (ref 22–32)
CO2: 26 mmol/L (ref 22–32)
CREATININE: 0.76 mg/dL (ref 0.44–1.00)
Calcium: 8.5 mg/dL — ABNORMAL LOW (ref 8.9–10.3)
Chloride: 102 mmol/L (ref 101–111)
Chloride: 101 mmol/L (ref 101–111)
Creatinine, Ser: 0.64 mg/dL (ref 0.44–1.00)
GFR calc non Af Amer: >60 mL/min (ref >60)
Glucose, Bld: 110 mg/dL — ABNORMAL HIGH (ref 65–99)
Glucose, Bld: 102 mg/dL — ABNORMAL HIGH (ref 65–99)
Potassium: 3.7 mmol/L (ref 3.5–5.1)
Potassium: 3.7 mmol/L (ref 3.5–5.1)
SODIUM: 139 mmol/L (ref 135–145)
Sodium: 139 mmol/L (ref 135–145)

## 2016-05-02 LAB — CBC
HCT: 39.6 % (ref 35.0–47.0)
HCT: 43 % (ref 35.0–47.0)
HEMOGLOBIN: 13.4 g/dL (ref 12.0–16.0)
Hemoglobin: 14.2 g/dL (ref 12.0–16.0)
MCH: 29 pg (ref 26.0–34.0)
MCH: 28.9 pg (ref 26.0–34.0)
MCHC: 33.8 g/dL (ref 32.0–36.0)
MCHC: 33.1 g/dL (ref 32.0–36.0)
MCV: 85.7 fL (ref 80.0–100.0)
MCV: 87.2 fL (ref 80.0–100.0)
Platelets: 225 10*3/uL (ref 150–440)
Platelets: 205 10*3/uL (ref 150–440)
RBC: 4.62 MIL/uL (ref 3.80–5.20)
RBC: 4.93 MIL/uL (ref 3.80–5.20)
RDW: 15 % — ABNORMAL HIGH (ref 11.5–14.5)
RDW: 15.3 % — AB (ref 11.5–14.5)
WBC: 10.8 10*3/uL (ref 3.6–11.0)
WBC: 10.1 10*3/uL (ref 3.6–11.0)

## 2016-05-02 LAB — INFLUENZA PANEL BY PCR (TYPE A & B)
INFLAPCR: NEGATIVE
INFLBPCR: POSITIVE — AB

## 2016-05-02 LAB — LIPID PANEL
CHOLESTEROL: 178 mg/dL (ref 0–200)
HDL: 44 mg/dL (ref >40)
LDL Cholesterol: 120 mg/dL — ABNORMAL HIGH (ref 0–99)
Total CHOL/HDL Ratio: 4 RATIO
Triglycerides: 69 mg/dL (ref <150)
VLDL: 14 mg/dL (ref 0–40)

## 2016-05-02 LAB — GLUCOSE, CAPILLARY
GLUCOSE-CAPILLARY: 112 mg/dL — AB (ref 65–99)
GLUCOSE-CAPILLARY: 108 mg/dL — AB (ref 65–99)
Glucose-Capillary: 116 mg/dL — ABNORMAL HIGH (ref 65–99)

## 2016-05-02 LAB — HEMOGLOBIN A1C
Hgb A1c MFr Bld: 5.9 % — ABNORMAL HIGH (ref 4.8–5.6)
Mean Plasma Glucose: 123 mg/dL

## 2016-05-02 LAB — PROTIME-INR
INR: 1.2
PROTHROMBIN TIME: 15.3 s — AB (ref 11.4–15.2)

## 2016-05-02 LAB — HEPARIN LEVEL (UNFRACTIONATED)
HEPARIN UNFRACTIONATED: 0.62 [IU]/mL (ref 0.30–0.70)
Heparin Unfractionated: 0.68 IU/mL (ref 0.30–0.70)

## 2016-05-02 SURGERY — LEFT HEART CATH AND CORONARY ANGIOGRAPHY
Anesthesia: Moderate Sedation | Laterality: Right

## 2016-05-02 MED ORDER — MAGNESIUM SULFATE 2 GM/50ML IV SOLN
2.0000 g | Freq: Once | INTRAVENOUS | Status: AC
  Administered 2016-05-02: 2 g via INTRAVENOUS
  Filled 2016-05-02: qty 50

## 2016-05-02 MED ORDER — SODIUM CHLORIDE 0.9 % WEIGHT BASED INFUSION: 3.0000 mL/kg/h | INTRAVENOUS | Status: DC

## 2016-05-02 MED ORDER — ASPIRIN 81 MG PO CHEW: 81.0000 mg | CHEWABLE_TABLET | ORAL | Status: DC

## 2016-05-02 MED ORDER — ATORVASTATIN CALCIUM 20 MG PO TABS
40.0000 mg | ORAL_TABLET | Freq: Every day | ORAL | Status: DC
  Administered 2016-05-03 – 2016-05-06 (×4): 40 mg via ORAL
  Filled 2016-05-02 (×6): qty 2

## 2016-05-02 MED ORDER — METOPROLOL TARTRATE 25 MG PO TABS
25.0000 mg | ORAL_TABLET | Freq: Two times a day (BID) | ORAL | Status: DC
  Administered 2016-05-02 – 2016-05-06 (×9): 25 mg via ORAL
  Filled 2016-05-02 (×9): qty 1

## 2016-05-02 MED ORDER — SODIUM CHLORIDE 0.9 % IV SOLN: 250.0000 mL | INTRAVENOUS | Status: DC | PRN

## 2016-05-02 MED ORDER — OSELTAMIVIR PHOSPHATE 75 MG PO CAPS
75.0000 mg | ORAL_CAPSULE | Freq: Two times a day (BID) | ORAL | Status: AC
  Administered 2016-05-02 – 2016-05-07 (×10): 75 mg via ORAL
  Filled 2016-05-02 (×11): qty 1

## 2016-05-02 MED ORDER — AZITHROMYCIN 250 MG PO TABS: 250.0000 mg | ORAL_TABLET | Freq: Every day | ORAL | Status: DC

## 2016-05-02 MED ORDER — IPRATROPIUM-ALBUTEROL 0.5-2.5 (3) MG/3ML IN SOLN
3.0000 mL | Freq: Four times a day (QID) | RESPIRATORY_TRACT | Status: DC
  Administered 2016-05-02 – 2016-05-06 (×13): 3 mL via RESPIRATORY_TRACT
  Filled 2016-05-02 (×13): qty 3

## 2016-05-02 MED ORDER — SODIUM CHLORIDE 0.9% FLUSH: 3.0000 mL | INTRAVENOUS | Status: DC | PRN

## 2016-05-02 MED ORDER — ASPIRIN EC 81 MG PO TBEC
81.0000 mg | DELAYED_RELEASE_TABLET | Freq: Every day | ORAL | Status: DC
  Administered 2016-05-03 – 2016-05-07 (×5): 81 mg via ORAL
  Filled 2016-05-02 (×6): qty 1

## 2016-05-02 MED ORDER — SODIUM CHLORIDE 0.9% FLUSH: 3.0000 mL | Freq: Two times a day (BID) | INTRAVENOUS | Status: DC

## 2016-05-02 MED ORDER — SODIUM CHLORIDE 0.9 % WEIGHT BASED INFUSION: 1.0000 mL/kg/h | INTRAVENOUS | Status: DC

## 2016-05-02 MED ORDER — AZITHROMYCIN 250 MG PO TABS
500.0000 mg | ORAL_TABLET | Freq: Every day | ORAL | Status: AC
  Administered 2016-05-02: 500 mg via ORAL
  Filled 2016-05-02: qty 2

## 2016-05-02 MED ORDER — BUDESONIDE 0.5 MG/2ML IN SUSP
0.5000 mg | Freq: Two times a day (BID) | RESPIRATORY_TRACT | Status: DC
  Administered 2016-05-02 – 2016-05-07 (×9): 0.5 mg via RESPIRATORY_TRACT
  Filled 2016-05-02 (×9): qty 2

## 2016-05-02 MED ORDER — CEFTRIAXONE SODIUM-DEXTROSE 1-3.74 GM-% IV SOLR
1.0000 g | INTRAVENOUS | Status: DC
  Administered 2016-05-02: 1 g via INTRAVENOUS
  Filled 2016-05-02: qty 50

## 2016-05-02 NOTE — Discharge Instructions (Signed)
Heart Failure Clinic appointment on May 13, 2016 at 9:20am with Darylene Price, [employer]. Please call [phone] to reschedule.

## 2016-05-02 NOTE — Progress Notes (Signed)
SUBJECTIVE: Patient is short of breath but no chest pain   Vitals:   05/01/16 2031 05/02/16 0221 05/02/16 0452 05/02/16 0646  BP: (!) 162/90 (!) 156/95 136/74   Pulse: 97 (!) 119 (!) 103   Resp: 18 20 20    Temp: 98.2 F (36.8 C) 97.6 F (36.4 C) 97.7 F (36.5 C)   TempSrc: Oral     SpO2: 97% 99% 98%   Weight:    208 lb 1.6 oz (94.4 kg)  Height:        Intake/Output Summary (Last 24 hours) at 05/02/16 0906 Last data filed at 05/02/16 0215  Gross per 24 hour  Intake           421.71 ml  Output                0 ml  Net           421.71 ml    LABS: Basic Metabolic Panel:  Recent Labs  05/01/16 0616 05/01/16 0930 05/02/16 0432  NA 139  --  139  K 3.2*  --  3.7  CL 106  --  102  CO2 23  --  28  GLUCOSE 193*  --  110*  BUN 18  --  25*  CREATININE 0.94  --  0.64  CALCIUM 8.5*  --  8.5*  MG  --  1.6*  --    Liver Function Tests: No results for input(s): AST, ALT, ALKPHOS, BILITOT, PROT, ALBUMIN in the last 72 hours. No results for input(s): LIPASE, AMYLASE in the last 72 hours. CBC:  Recent Labs  05/01/16 0715 05/02/16 0432  WBC 16.9* 10.8  NEUTROABS 15.8*  --   HGB 13.2 13.4  HCT 39.1 39.6  MCV 85.6 85.7  PLT 238 225   Cardiac Enzymes:  Recent Labs  05/01/16 0616 05/01/16 1241 05/01/16 1621  TROPONINI 0.19* 3.51* 3.52*   BNP: Invalid input(s): POCBNP D-Dimer: No results for input(s): DDIMER in the last 72 hours. Hemoglobin A1C:  Recent Labs  05/01/16 0930  HGBA1C 5.9*   Fasting Lipid Panel:  Recent Labs  05/02/16 0432  CHOL 178  HDL 44  LDLCALC 120*  TRIG 69  CHOLHDL 4.0   Thyroid Function Tests: No results for input(s): TSH, T4TOTAL, T3FREE, THYROIDAB in the last 72 hours.  Invalid input(s): FREET3 Anemia Panel: No results for input(s): VITAMINB12, FOLATE, FERRITIN, TIBC, IRON, RETICCTPCT in the last 72 hours.   PHYSICAL EXAM General: Well developed, well nourished, in no acute distress HEENT:  Normocephalic and  atramatic Neck:  No JVD.  Lungs: Clear bilaterally to auscultation and percussion. Heart: HRRR . Normal S1 and S2 without gallops or murmurs.  Abdomen: Bowel sounds are positive, abdomen soft and non-tender  Msk:  Back normal, normal gait. Normal strength and tone for age. Extremities: No clubbing, cyanosis or edema.   Neuro: Alert and oriented X 3. Psych:  Good affect, responds appropriately  TELEMETRY:Sinus rhythm  ASSESSMENT AND PLAN: Non-STEMI with congestive heart failure. Discussed risks and benefits and patient is still thinking about it and wants to talk to her daughter before scheduling cardiac catheterization.  Active Problems:   Acute respiratory failure with hypoxia (HCC)   Acute CHF (congestive heart failure) (HCC)    Mackenzie Hickam A, MD, [facility] 05/02/2016 9:06 AM

## 2016-05-02 NOTE — Care Management (Signed)
CM made attempt to speak with patient today.  She is very dyspneic even with oxygen and very flushed.  Elevated temp resulted in cancellation of cardiac cath.  She does not have a pcp.  Would like to be followed at the [facility] .  Scheduled appointment for March 29 at 2:00pm.  Should arrive by 1:30p- have ID, Insurance and medications.  Faxed demographics and consults [phone]

## 2016-05-02 NOTE — Progress Notes (Signed)
Procedure cancelled secondary to temp of 102.8  Dr. Humphrey Rolls notified and [facility] telemetry notified that the patient procedure  is cancelled  Heparin restarted at previous rate per Dr. Humphrey Rolls....only turned off for about 10 mins.

## 2016-05-02 NOTE — Progress Notes (Signed)
Cath cancelled due to high fever ( 102.8) MD paged to make aware

## 2016-05-02 NOTE — Progress Notes (Addendum)
Patient ID: Mackenzie [name], female   DOB: 10-12-1950, 66 y.o.   MRN: [id number]  [employer] PROGRESS NOTE  COPPER KIRTLEY QTM:[id number] DOB: 04-Oct-1950 DOA: 05/01/2016 PCP: No PCP Per Patient  HPI/Subjective: Patient seen this morning. She was agreeable to do cardiac catheterization. When she was down there she had a fever of 102 and procedure was canceled. He was admitted with an acute myocardial infarction.  Objective: Vitals:   05/02/16 1253 05/02/16 1358  BP: (!) 167/70   Pulse: (!) 112   Resp: 18   Temp: 99.7 F (37.6 C) 99.5 F (37.5 C)    Filed Weights   05/01/16 0616 05/02/16 0646  Weight: 98 kg (216 lb) 94.4 kg (208 lb 1.6 oz)    ROS: Review of Systems  Constitutional: Negative for chills and fever.  Eyes: Negative for blurred vision.  Respiratory: Positive for cough and shortness of breath.   Cardiovascular: Negative for chest pain.  Gastrointestinal: Negative for abdominal pain, constipation, diarrhea, nausea and vomiting.  Genitourinary: Negative for dysuria.  Musculoskeletal: Negative for joint pain.  Neurological: Negative for dizziness and headaches.   Exam: Physical Exam  Constitutional: She is oriented to person, place, and time.  HENT:  Nose: No mucosal edema.  Mouth/Throat: No oropharyngeal exudate or posterior oropharyngeal edema.  Eyes: Conjunctivae, EOM and lids are normal. Pupils are equal, round, and reactive to light.  Neck: No JVD present. Carotid bruit is not present. No edema present. No thyroid mass and no thyromegaly present.  Cardiovascular: S1 normal and S2 normal.  Exam reveals no gallop.   No murmur heard. Pulses:      Dorsalis pedis pulses are 2+ on the right side, and 2+ on the left side.  Respiratory: No respiratory distress. She has decreased breath sounds in the right middle field, the right lower field, the left middle field and the left lower field. She has wheezes in the right middle field, the right lower field, the left  middle field and the left lower field. She has no rhonchi. She has no rales.  GI: Soft. Bowel sounds are normal. There is no tenderness.  Musculoskeletal:       Right ankle: She exhibits swelling.       Left ankle: She exhibits swelling.  Lymphadenopathy:    She has no cervical adenopathy.  Neurological: She is alert and oriented to person, place, and time. No cranial nerve deficit.  Skin: Skin is warm. No rash noted. Nails show no clubbing.  Psychiatric: She has a normal mood and affect.      Data Reviewed: Basic Metabolic Panel:  Recent Labs Lab 05/01/16 0616 05/01/16 0930 05/02/16 0432 05/02/16 1019  NA 139  --  139 139  K 3.2*  --  3.7 3.7  CL 106  --  102 101  CO2 23  --  28 26  GLUCOSE 193*  --  110* 102*  BUN 18  --  25* 24*  CREATININE 0.94  --  0.64 0.76  CALCIUM 8.5*  --  8.5* 8.7*  MG  --  1.6*  --   --    Liver Function Tests: No results for input(s): AST, ALT, ALKPHOS, BILITOT, PROT, ALBUMIN in the last 168 hours. No results for input(s): LIPASE, AMYLASE in the last 168 hours. No results for input(s): AMMONIA in the last 168 hours. CBC:  Recent Labs Lab 05/01/16 0715 05/02/16 0432 05/02/16 1019  WBC 16.9* 10.8 10.1  NEUTROABS 15.8*  --   --  HGB 13.2 13.4 14.2  HCT 39.1 39.6 43.0  MCV 85.6 85.7 87.2  PLT 238 225 205   Cardiac Enzymes:  Recent Labs Lab 05/01/16 0616 05/01/16 1241 05/01/16 1621  TROPONINI 0.19* 3.51* 3.52*   BNP (last 3 results)  Recent Labs  05/01/16 0715  BNP 367.0*    ProBNP (last 3 results) No results for input(s): PROBNP in the last 8760 hours.  CBG:  Recent Labs Lab 05/01/16 1249 05/01/16 1638 05/01/16 2119 05/02/16 0754  GLUCAP 147* 194* 105* 112*    No results found for this or any previous visit (from the past 240 hour(s)).   Studies: Dg Chest Port 1 View  Result Date: 05/01/2016 CLINICAL DATA:  Difficulty breathing, hypoxic. History of hypertension, CHF. EXAM: PORTABLE CHEST 1 VIEW  COMPARISON:  Chest radiograph September 09, 2008 FINDINGS: Cardiac silhouette is mild to moderately enlarged, increased from prior examination. Pulmonary vascular congestion and interstitial prominence. Small RIGHT pleural effusion. No pneumothorax. Degenerative change of the thoracic spine. Marked spurring LEFT humeral head. IMPRESSION: Cardiomegaly and findings of interstitial edema with small pleural effusions. Electronically Signed   By: Elon Alas M.D.   On: 05/01/2016 06:43    Scheduled Meds: . diltiazem  10 mg Intravenous Once  . furosemide  40 mg Intravenous BID  . hydrALAZINE  50 mg Oral Q8H  . insulin aspart  0-5 Units Subcutaneous QHS  . insulin aspart  0-9 Units Subcutaneous TID WC  . lisinopril  10 mg Oral Daily  . metoprolol succinate  25 mg Oral Daily  . oseltamivir  75 mg Oral BID  . sodium chloride flush  3 mL Intravenous Q12H  . sodium chloride flush  3 mL Intravenous Q12H   Continuous Infusions: . heparin 1,150 Units/hr (05/02/16 0524)    Assessment/Plan:  1. Acute respiratory failure with hypoxia. Patient required BiPAP initially on presentation to the ER. Now down to 2 L of oxygen. 2. Acute systolic congestive heart failure. Continue Lasix 40 mg IV twice a day.*Lisinopril and increase metoprolol dose. 3. Acute myocardial infarction. Start aspirin. Heparin drip. Metoprolol. Start Lipitor.  Cardiac catheter was canceled today secondary to fever of 102. 4. Influenza B positive. Start Tamiflu. 5. Atrial fibrillation with rapid ventricular response. Currently sinus tachycardia.  Code Status:     Code Status Orders        Start     Ordered   05/01/16 0932  Full code  Continuous     05/01/16 0931    Code Status History    Date Active Date Inactive Code Status Order ID Comments User Context   This patient has a current code status but no historical code status.      Disposition Plan: To be  determined  Consultants:  Cardiology  Antibiotics:  Tamiflu  Time spent: 35 minutes  [place], [place]

## 2016-05-02 NOTE — Progress Notes (Signed)
ANTICOAGULATION CONSULT NOTE - Initial Consult  Pharmacy Consult for heparin drip  Indication: atrial fibrillation  No Known Allergies  Patient Measurements: Height: 5\' 6"  (167.6 cm) Weight: 216 lb (98 kg) IBW/kg (Calculated) : 59.3  Height: 5 foot 6 inches per RN/patient Heparin Dosing Weight: 81 kg  Vital Signs: Temp: 98.2 F (36.8 C) (03/14 2031) Temp Source: Oral (03/14 2031) BP: 162/90 (03/14 2031) Pulse Rate: 97 (03/14 2031)  Labs:  Recent Labs (last 2 labs)    Recent Labs  05/01/16 0616 05/01/16 0715 05/01/16 0930 05/01/16 1241 05/01/16 1621 05/01/16 2203  HGB  --  13.2  --   --   --   --   HCT  --  39.1  --   --   --   --   PLT  --  238  --   --   --   --   APTT  --   --  32  --   --   --   LABPROT  --   --  14.6  --   --   --   INR  --   --  1.13  --   --   --   HEPARINUNFRC  --   --   --   --  0.54 0.63  CREATININE 0.94  --   --   --   --   --   TROPONINI 0.19*  --   --  3.51* 3.52*  --       Estimated Creatinine Clearance: 70.5 mL/min (by C-G formula based on SCr of 0.94 mg/dL).   Medical History:     Past Medical History:  Diagnosis Date  . Arthritis   . Hypertension    Medications:         Prescriptions Prior to Admission  Medication Sig Dispense Refill Last Dose  . aspirin EC 81 MG tablet Take 81 mg by mouth daily.   04/30/2016 at am  . olmesartan-hydrochlorothiazide (BENICAR HCT) 40-25 MG tablet Take 1 tablet by mouth daily.   Past Month at Unknown time   Scheduled:  . diltiazem  10 mg Intravenous Once  . furosemide  40 mg Intravenous BID  . hydrALAZINE  50 mg Oral Q8H  . insulin aspart  0-5 Units Subcutaneous QHS  . insulin aspart  0-9 Units Subcutaneous TID WC  . lisinopril  10 mg Oral Daily  . metoprolol succinate  25 mg Oral Daily  . sodium chloride flush  3 mL Intravenous Q12H  . sodium chloride flush  3 mL Intravenous Q12H   Infusions:  . heparin 1,150 Units/hr (05/01/16 1027)     Assessment: Pharmacy consulted to dose and monitor heparin drip in this 66 year old female admitted with new onset atrial fibrillation. Obtained patient's height from RN/patient. Patient was not taking anticoagulation prior to admission.  Goal of Therapy:  Heparin level 0.3-0.7 units/ml Monitor platelets by anticoagulation protocol: Yes   Plan:  Continue current rate and recheck HL in 6 hours.   3/15 @ 0430 HL: 0.62 Will continue current rate and recheck HL @ 1030 3/15.  Thank you for this consult.  Tobie Lords, PharmD, BCPS Clinical Pharmacist 05/02/2016

## 2016-05-02 NOTE — Progress Notes (Signed)
Unable to do cath today as had 102 temperature while in holding area, and cancelled it as may have sepsis.

## 2016-05-02 NOTE — Progress Notes (Signed)
ANTICOAGULATION CONSULT NOTE - Initial Consult  Pharmacy Consult for heparin drip  Indication: atrial fibrillation  No Known Allergies  Patient Measurements: Height: 5\' 6"  (167.6 cm) Weight: 216 lb (98 kg) IBW/kg (Calculated) : 59.3  Height: 5 foot 6 inches per RN/patient Heparin Dosing Weight: 81 kg  Vital Signs: Temp: 98.2 F (36.8 C) (03/14 2031) Temp Source: Oral (03/14 2031) BP: 162/90 (03/14 2031) Pulse Rate: 97 (03/14 2031)  Labs:  Recent Labs (last 2 labs)    Recent Labs  05/01/16 0616 05/01/16 0715 05/01/16 0930 05/01/16 1241 05/01/16 1621 05/01/16 2203  HGB  --  13.2  --   --   --   --   HCT  --  39.1  --   --   --   --   PLT  --  238  --   --   --   --   APTT  --   --  32  --   --   --   LABPROT  --   --  14.6  --   --   --   INR  --   --  1.13  --   --   --   HEPARINUNFRC  --   --   --   --  0.54 0.63  CREATININE 0.94  --   --   --   --   --   TROPONINI 0.19*  --   --  3.51* 3.52*  --       Estimated Creatinine Clearance: 70.5 mL/min (by C-G formula based on SCr of 0.94 mg/dL).   Medical History:     Past Medical History:  Diagnosis Date  . Arthritis   . Hypertension    Medications:         Prescriptions Prior to Admission  Medication Sig Dispense Refill Last Dose  . aspirin EC 81 MG tablet Take 81 mg by mouth daily.   04/30/2016 at am  . olmesartan-hydrochlorothiazide (BENICAR HCT) 40-25 MG tablet Take 1 tablet by mouth daily.   Past Month at Unknown time   Scheduled:  . diltiazem  10 mg Intravenous Once  . furosemide  40 mg Intravenous BID  . hydrALAZINE  50 mg Oral Q8H  . insulin aspart  0-5 Units Subcutaneous QHS  . insulin aspart  0-9 Units Subcutaneous TID WC  . lisinopril  10 mg Oral Daily  . metoprolol succinate  25 mg Oral Daily  . sodium chloride flush  3 mL Intravenous Q12H  . sodium chloride flush  3 mL Intravenous Q12H   Infusions:  . heparin 1,150 Units/hr (05/01/16 1027)     Assessment: Pharmacy consulted to dose and monitor heparin drip in this 66 year old female admitted with new onset atrial fibrillation. Obtained patient's height from RN/patient. Patient was not taking anticoagulation prior to admission.  Goal of Therapy:  Heparin level 0.3-0.7 units/ml Monitor platelets by anticoagulation protocol: Yes   Plan:  HL = 0.68 (therapeutic). This is the 3rd therapeutic heparin level. Will continue heparin at current rate of 1150 units/hr and recheck HL and CBC tomorrow morning with AM labs.  Thank you for this consult.  Lenis Noon, PharmD, BCPS Clinical Pharmacist 05/02/2016

## 2016-05-02 NOTE — Progress Notes (Signed)
Pharmacy Antibiotic Note  Mackenzie [name] is a 66 y.o. female admitted on 05/01/2016 with fever. Pharmacy has been consulted for ceftriaxone dosing.  Plan: Ceftriaxone 1 g iv daily  Height: 5\' 6"  (167.6 cm) Weight: 208 lb 1.6 oz (94.4 kg) IBW/kg (Calculated) : 59.3  Temp (24hrs), Avg:99.1 F (37.3 C), Min:97.6 F (36.4 C), Max:102 F (38.9 C)   Recent Labs Lab 05/01/16 0616 05/01/16 0715 05/02/16 0432 05/02/16 1019  WBC  --  16.9* 10.8 10.1  CREATININE 0.94  --  0.64 0.76    Estimated Creatinine Clearance: 81.1 mL/min (by C-G formula based on SCr of 0.76 mg/dL).    No Known Allergies Thank you for allowing pharmacy to be a part of this patient's care.  Darylene Price [facility] 05/02/2016 3:40 PM

## 2016-05-03 LAB — GLUCOSE, CAPILLARY
GLUCOSE-CAPILLARY: 178 mg/dL — AB (ref 65–99)
GLUCOSE-CAPILLARY: 115 mg/dL — AB (ref 65–99)
GLUCOSE-CAPILLARY: 206 mg/dL — AB (ref 65–99)
GLUCOSE-CAPILLARY: 118 mg/dL — AB (ref 65–99)

## 2016-05-03 LAB — CBC
HCT: 36.6 % (ref 35.0–47.0)
HEMOGLOBIN: 12.1 g/dL (ref 12.0–16.0)
MCH: 28.4 pg (ref 26.0–34.0)
MCHC: 33 g/dL (ref 32.0–36.0)
MCV: 86.2 fL (ref 80.0–100.0)
Platelets: 170 10*3/uL (ref 150–440)
RBC: 4.24 MIL/uL (ref 3.80–5.20)
RDW: 15.2 % — AB (ref 11.5–14.5)
WBC: 6.7 10*3/uL (ref 3.6–11.0)

## 2016-05-03 LAB — HEPARIN LEVEL (UNFRACTIONATED)
Heparin Unfractionated: 0.44 IU/mL (ref 0.30–0.70)
Heparin Unfractionated: 0.62 IU/mL (ref 0.30–0.70)

## 2016-05-03 MED ORDER — FUROSEMIDE 40 MG PO TABS
40.0000 mg | ORAL_TABLET | Freq: Every day | ORAL | Status: DC
  Administered 2016-05-04: 40 mg via ORAL
  Filled 2016-05-03: qty 1

## 2016-05-03 MED ORDER — HYDRALAZINE HCL 25 MG PO TABS
25.0000 mg | ORAL_TABLET | Freq: Three times a day (TID) | ORAL | Status: DC
  Administered 2016-05-03 – 2016-05-04 (×3): 25 mg via ORAL
  Filled 2016-05-03 (×3): qty 1

## 2016-05-03 MED ORDER — DILTIAZEM HCL ER COATED BEADS 120 MG PO CP24
120.0000 mg | ORAL_CAPSULE | Freq: Every day | ORAL | Status: DC
  Administered 2016-05-03 – 2016-05-06 (×4): 120 mg via ORAL
  Filled 2016-05-03 (×4): qty 1

## 2016-05-03 MED ORDER — CLOPIDOGREL BISULFATE 75 MG PO TABS
300.0000 mg | ORAL_TABLET | Freq: Once | ORAL | Status: AC
  Administered 2016-05-03: 300 mg via ORAL
  Filled 2016-05-03: qty 4

## 2016-05-03 MED ORDER — METHYLPREDNISOLONE SODIUM SUCC 40 MG IJ SOLR
40.0000 mg | Freq: Every day | INTRAMUSCULAR | Status: DC
  Administered 2016-05-03 – 2016-05-05 (×3): 40 mg via INTRAVENOUS
  Filled 2016-05-03 (×3): qty 1

## 2016-05-03 MED ORDER — CLOPIDOGREL BISULFATE 75 MG PO TABS
75.0000 mg | ORAL_TABLET | Freq: Once | ORAL | Status: AC
  Administered 2016-05-04: 75 mg via ORAL
  Filled 2016-05-03: qty 1

## 2016-05-03 NOTE — Plan of Care (Signed)
Problem: Health Behavior/Discharge Planning: Goal: Ability to manage health-related needs will improve Outcome: Not Progressing Hx of non-compliance.

## 2016-05-03 NOTE — Progress Notes (Signed)
ANTICOAGULATION CONSULT NOTE - Initial Consult  Pharmacy Consult for heparin drip  Indication: atrial fibrillation  No Known Allergies  Patient Measurements: Height: 5\' 6"  (167.6 cm) Weight: 216 lb (98 kg) IBW/kg (Calculated) : 59.3  Height: 5 foot 6 inches per RN/patient Heparin Dosing Weight: 81 kg  Vital Signs: Temp: 98.2 F (36.8 C) (03/14 2031) Temp Source: Oral (03/14 2031) BP: 162/90 (03/14 2031) Pulse Rate: 97 (03/14 2031)  Labs:  Recent Labs (last 2 labs)    Recent Labs  05/01/16 0616 05/01/16 0715 05/01/16 0930 05/01/16 1241 05/01/16 1621 05/01/16 2203  HGB  --  13.2  --   --   --   --   HCT  --  39.1  --   --   --   --   PLT  --  238  --   --   --   --   APTT  --   --  32  --   --   --   LABPROT  --   --  14.6  --   --   --   INR  --   --  1.13  --   --   --   HEPARINUNFRC  --   --   --   --  0.54 0.63  CREATININE 0.94  --   --   --   --   --   TROPONINI 0.19*  --   --  3.51* 3.52*  --       Estimated Creatinine Clearance: 70.5 mL/min (by C-G formula based on SCr of 0.94 mg/dL).   Medical History:     Past Medical History:  Diagnosis Date  . Arthritis   . Hypertension    Medications:         Prescriptions Prior to Admission  Medication Sig Dispense Refill Last Dose  . aspirin EC 81 MG tablet Take 81 mg by mouth daily.   04/30/2016 at am  . olmesartan-hydrochlorothiazide (BENICAR HCT) 40-25 MG tablet Take 1 tablet by mouth daily.   Past Month at Unknown time   Scheduled:  . diltiazem  10 mg Intravenous Once  . furosemide  40 mg Intravenous BID  . hydrALAZINE  50 mg Oral Q8H  . insulin aspart  0-5 Units Subcutaneous QHS  . insulin aspart  0-9 Units Subcutaneous TID WC  . lisinopril  10 mg Oral Daily  . metoprolol succinate  25 mg Oral Daily  . sodium chloride flush  3 mL Intravenous Q12H  . sodium chloride flush  3 mL Intravenous Q12H   Infusions:  . heparin 1,150 Units/hr (05/01/16 1027)     Assessment: Pharmacy consulted to dose and monitor heparin drip in this 66 year old female admitted with new onset atrial fibrillation. Obtained patient's height from RN/patient. Patient was not taking anticoagulation prior to admission.  Goal of Therapy:  Heparin level 0.3-0.7 units/ml Monitor platelets by anticoagulation protocol: Yes   Plan:  HL = 0.62 (therapeutic). Will continue heparin at current rate of 1150 units/hr and recheck HL and CBC tomorrow morning with AM labs.  Thank you for this consult.  Lenis Noon, PharmD, BCPS Clinical Pharmacist 05/03/2016

## 2016-05-03 NOTE — Progress Notes (Addendum)
Patient ID: Mackenzie [name], female   DOB: 1950/12/14, 66 y.o.   MRN: [id number]  [employer] PROGRESS NOTE  Mackenzie [name] GGE:[id number] DOB: December 10, 1950 DOA: 05/01/2016 PCP: No PCP Per Patient  HPI/Subjective: Patient feeling better today. Still with shortness of breath cough and wheeze  Objective: Vitals:   05/02/16 1926 05/03/16 0503  BP: 132/69 139/89  Pulse: (!) 116 91  Resp: 16 16  Temp: 98.3 F (36.8 C) 98.3 F (36.8 C)    Filed Weights   05/01/16 0616 05/02/16 0646 05/03/16 0503  Weight: 98 kg (216 lb) 94.4 kg (208 lb 1.6 oz) 94.1 kg (207 lb 6.4 oz)    ROS: Review of Systems  Constitutional: Negative for chills and fever.  Eyes: Negative for blurred vision.  Respiratory: Positive for cough, shortness of breath and wheezing.   Cardiovascular: Negative for chest pain.  Gastrointestinal: Negative for abdominal pain, constipation, diarrhea, nausea and vomiting.  Genitourinary: Negative for dysuria.  Musculoskeletal: Negative for joint pain.  Neurological: Negative for dizziness and headaches.   Exam: Physical Exam  Constitutional: She is oriented to person, place, and time.  HENT:  Nose: No mucosal edema.  Mouth/Throat: No oropharyngeal exudate or posterior oropharyngeal edema.  Eyes: Conjunctivae, EOM and lids are normal. Pupils are equal, round, and reactive to light.  Neck: No JVD present. Carotid bruit is not present. No edema present. No thyroid mass and no thyromegaly present.  Cardiovascular: S1 normal and S2 normal.  An irregularly irregular rhythm present. Exam reveals no gallop.   No murmur heard. Pulses:      Dorsalis pedis pulses are 2+ on the right side, and 2+ on the left side.  Respiratory: No respiratory distress. She has decreased breath sounds in the right middle field, the right lower field, the left middle field and the left lower field. She has wheezes in the right middle field, the right lower field, the left middle field and the left lower  field. She has no rhonchi. She has no rales.  GI: Soft. Bowel sounds are normal. There is no tenderness.  Musculoskeletal:       Right ankle: She exhibits swelling.       Left ankle: She exhibits swelling.  Lymphadenopathy:    She has no cervical adenopathy.  Neurological: She is alert and oriented to person, place, and time. No cranial nerve deficit.  Skin: Skin is warm. No rash noted. Nails show no clubbing.  Psychiatric: She has a normal mood and affect.      Data Reviewed: Basic Metabolic Panel:  Recent Labs Lab 05/01/16 0616 05/01/16 0930 05/02/16 0432 05/02/16 1019  NA 139  --  139 139  K 3.2*  --  3.7 3.7  CL 106  --  102 101  CO2 23  --  28 26  GLUCOSE 193*  --  110* 102*  BUN 18  --  25* 24*  CREATININE 0.94  --  0.64 0.76  CALCIUM 8.5*  --  8.5* 8.7*  MG  --  1.6*  --   --    CBC:  Recent Labs Lab 05/01/16 0715 05/02/16 0432 05/02/16 1019 05/03/16 0515  WBC 16.9* 10.8 10.1 6.7  NEUTROABS 15.8*  --   --   --   HGB 13.2 13.4 14.2 12.1  HCT 39.1 39.6 43.0 36.6  MCV 85.6 85.7 87.2 86.2  PLT 238 225 205 170   Cardiac Enzymes:  Recent Labs Lab 05/01/16 0616 05/01/16 1241 05/01/16 1621  TROPONINI 0.19*  3.51* 3.52*   BNP (last 3 results)  Recent Labs  05/01/16 0715  BNP 367.0*     CBG:  Recent Labs Lab 05/02/16 0754 05/02/16 1653 05/02/16 2037 05/03/16 0857 05/03/16 1125  GLUCAP 112* 116* 108* 178* 115*     Scheduled Meds: . aspirin EC  81 mg Oral Daily  . atorvastatin  40 mg Oral q1800  . budesonide (PULMICORT) nebulizer solution  0.5 mg Nebulization BID  . [START ON 05/04/2016] clopidogrel  75 mg Oral Once  . [START ON 05/04/2016] furosemide  40 mg Oral Daily  . hydrALAZINE  50 mg Oral Q8H  . insulin aspart  0-5 Units Subcutaneous QHS  . insulin aspart  0-9 Units Subcutaneous TID WC  . ipratropium-albuterol  3 mL Nebulization Q6H  . lisinopril  10 mg Oral Daily  . methylPREDNISolone (SOLU-MEDROL) injection  40 mg Intravenous  Daily  . metoprolol tartrate  25 mg Oral BID  . oseltamivir  75 mg Oral BID  . sodium chloride flush  3 mL Intravenous Q12H  . sodium chloride flush  3 mL Intravenous Q12H   Continuous Infusions: . heparin 1,150 Units/hr (05/02/16 0524)    Assessment/Plan:  1. Acute respiratory failure with hypoxia. Patient required BiPAP initially on presentation to the ER. Now down to 2 L of oxygen. 2. Acute systolic congestive heart failure. Change Lasix to oral. Continue metoprolol. Lisinopril and hydralazine added by cardiology. 3. Acute myocardial infarction. Started aspirin. continueHeparin drip. Metoprolol. Started Lipitor. Cardiac catheterization potentially as outpatient versus Monday depending on clinical course. 4. Influenza B positive. Started Tamiflu. I added Solu-Medrol with wheeze today. 5. Atrial fibrillation with fast heart rate. I'm hesitant on increasing the metoprolol further secondary to the patient's wheeze. Add low-dose Cardizem CD.  Code Status:     Code Status Orders        Start     Ordered   05/01/16 0932  Full code  Continuous     05/01/16 0931    Code Status History    Date Active Date Inactive Code Status Order ID Comments User Context   This patient has a current code status but no historical code status.     Disposition Plan: To be determined depending on clinical course  Consultants:  Cardiology  Antibiotics:  Tamiflu  Time spent: 28 minutes. Case Discussed with cardiology. Case also discussed with sister at the bedside and daughter on the phone  Mackenzie [name]  [employer]

## 2016-05-03 NOTE — Care Management Important Message (Signed)
Important Message  Patient Details  Name: Mackenzie [name] MRN: [id number] Date of Birth: [birth year]   Medicare Important Message Given:  Yes    Katrina Stack, RN 05/03/2016, 2:58 PM

## 2016-05-03 NOTE — Progress Notes (Signed)
SUBJECTIVE: Patient denies any chest pain or shortness of breath   Vitals:   05/02/16 1926 05/02/16 1956 05/03/16 0503 05/03/16 0729  BP: 132/69  139/89   Pulse: (!) 116  91   Resp: 16  16   Temp: 98.3 F (36.8 C)  98.3 F (36.8 C)   TempSrc: Oral  Oral   SpO2: 97% 99% 98% 99%  Weight:   207 lb 6.4 oz (94.1 kg)   Height:        Intake/Output Summary (Last 24 hours) at 05/03/16 0836 Last data filed at 05/03/16 0503  Gross per 24 hour  Intake           298.23 ml  Output             1050 ml  Net          -751.77 ml    LABS: Basic Metabolic Panel:  Recent Labs  05/01/16 0930 05/02/16 0432 05/02/16 1019  NA  --  139 139  K  --  3.7 3.7  CL  --  102 101  CO2  --  28 26  GLUCOSE  --  110* 102*  BUN  --  25* 24*  CREATININE  --  0.64 0.76  CALCIUM  --  8.5* 8.7*  MG 1.6*  --   --    Liver Function Tests: No results for input(s): AST, ALT, ALKPHOS, BILITOT, PROT, ALBUMIN in the last 72 hours. No results for input(s): LIPASE, AMYLASE in the last 72 hours. CBC:  Recent Labs  05/01/16 0715  05/02/16 1019 05/03/16 0515  WBC 16.9*  < > 10.1 6.7  NEUTROABS 15.8*  --   --   --   HGB 13.2  < > 14.2 12.1  HCT 39.1  < > 43.0 36.6  MCV 85.6  < > 87.2 86.2  PLT 238  < > 205 170  < > = values in this interval not displayed. Cardiac Enzymes:  Recent Labs  05/01/16 0616 05/01/16 1241 05/01/16 1621  TROPONINI 0.19* 3.51* 3.52*   BNP: Invalid input(s): POCBNP D-Dimer: No results for input(s): DDIMER in the last 72 hours. Hemoglobin A1C:  Recent Labs  05/01/16 0930  HGBA1C 5.9*   Fasting Lipid Panel:  Recent Labs  05/02/16 0432  CHOL 178  HDL 44  LDLCALC 120*  TRIG 69  CHOLHDL 4.0   Thyroid Function Tests: No results for input(s): TSH, T4TOTAL, T3FREE, THYROIDAB in the last 72 hours.  Invalid input(s): FREET3 Anemia Panel: No results for input(s): VITAMINB12, FOLATE, FERRITIN, TIBC, IRON, RETICCTPCT in the last 72 hours.   PHYSICAL  EXAM General: Well developed, well nourished, in no acute distress HEENT:  Normocephalic and atramatic Neck:  No JVD.  Lungs: Clear bilaterally to auscultation and percussion. Heart: HRRR . Normal S1 and S2 without gallops or murmurs.  Abdomen: Bowel sounds are positive, abdomen soft and non-tender  Msk:  Back normal, normal gait. Normal strength and tone for age. Extremities: No clubbing, cyanosis or edema.   Neuro: Alert and oriented X 3. Psych:  Good affect, responds appropriately  TELEMETRY: Sinus rhythm  ASSESSMENT AND PLAN: Non-STEMI with history of CHF and now has flu. Cardiac catheterization was canceled yesterday as patient had 102.6 temperature. Patient is positive for flu and is afebrile right now but still cannot do cardiac catheterization. We will add Plavix 300 mg 1 and then start 75 mg once a day tomorrow. If it's okay with medical point of view patient can be  discharged with follow-up Monday at 2 PM.  Active Problems:   Acute respiratory failure with hypoxia (HCC)   Acute CHF (congestive heart failure) (HCC)    Neoma Laming A, MD, [facility] 05/03/2016 8:36 AM

## 2016-05-03 NOTE — Care Management (Signed)
Patient remains lethargic on nasal  cannula oxygen.  Spoke with primary nurse about the need to perform home oxygen assessment and discussed that weaning the oxygen would be appreciated but need the actual home oxygen assessment completed also.

## 2016-05-04 LAB — GLUCOSE, CAPILLARY
GLUCOSE-CAPILLARY: 96 mg/dL (ref 65–99)
Glucose-Capillary: 160 mg/dL — ABNORMAL HIGH (ref 65–99)
Glucose-Capillary: 185 mg/dL — ABNORMAL HIGH (ref 65–99)
Glucose-Capillary: 143 mg/dL — ABNORMAL HIGH (ref 65–99)

## 2016-05-04 LAB — HEPARIN LEVEL (UNFRACTIONATED)
Heparin Unfractionated: 0.83 IU/mL — ABNORMAL HIGH (ref 0.30–0.70)
Heparin Unfractionated: 0.76 IU/mL — ABNORMAL HIGH (ref 0.30–0.70)
Heparin Unfractionated: 0.63 IU/mL (ref 0.30–0.70)

## 2016-05-04 LAB — BASIC METABOLIC PANEL
Anion gap: 9 (ref 5–15)
BUN: 34 mg/dL — AB (ref 6–20)
CALCIUM: 8.1 mg/dL — AB (ref 8.9–10.3)
CO2: 30 mmol/L (ref 22–32)
CREATININE: 0.86 mg/dL (ref 0.44–1.00)
Chloride: 99 mmol/L — ABNORMAL LOW (ref 101–111)
GFR calc Af Amer: >60 mL/min (ref >60)
GLUCOSE: 104 mg/dL — AB (ref 65–99)
Potassium: 3.3 mmol/L — ABNORMAL LOW (ref 3.5–5.1)
Sodium: 138 mmol/L (ref 135–145)

## 2016-05-04 LAB — CBC
HCT: 36.8 % (ref 35.0–47.0)
Hemoglobin: 12.3 g/dL (ref 12.0–16.0)
MCH: 28.8 pg (ref 26.0–34.0)
MCHC: 33.4 g/dL (ref 32.0–36.0)
MCV: 86.1 fL (ref 80.0–100.0)
PLATELETS: 165 10*3/uL (ref 150–440)
RBC: 4.27 MIL/uL (ref 3.80–5.20)
RDW: 15.1 % — AB (ref 11.5–14.5)
WBC: 4.2 10*3/uL (ref 3.6–11.0)

## 2016-05-04 MED ORDER — POTASSIUM CHLORIDE CRYS ER 20 MEQ PO TBCR
40.0000 meq | EXTENDED_RELEASE_TABLET | Freq: Once | ORAL | Status: AC
  Administered 2016-05-04: 40 meq via ORAL
  Filled 2016-05-04: qty 2

## 2016-05-04 MED ORDER — HYDRALAZINE HCL 10 MG PO TABS
10.0000 mg | ORAL_TABLET | Freq: Three times a day (TID) | ORAL | Status: DC
  Administered 2016-05-04 – 2016-05-07 (×7): 10 mg via ORAL
  Filled 2016-05-04 (×8): qty 1

## 2016-05-04 MED ORDER — FUROSEMIDE 20 MG PO TABS
20.0000 mg | ORAL_TABLET | Freq: Every day | ORAL | Status: DC
Start: 2016-05-05 — End: 2016-05-07
  Administered 2016-05-05 – 2016-05-07 (×3): 20 mg via ORAL
  Filled 2016-05-04 (×3): qty 1

## 2016-05-04 NOTE — Progress Notes (Signed)
ANTICOAGULATION CONSULT NOTE - Follow Up Consult  Pharmacy Consult for Heparin drip Indication: atrial fibrillation  No Known Allergies  Patient Measurements: Height: 5\' 6"  (167.6 cm) Weight: 206 lb 14.4 oz (93.8 kg) IBW/kg (Calculated) : 59.3 Heparin Dosing Weight: 80 kg  Vital Signs: Temp: 98.5 F (36.9 C) (03/17 1920) Temp Source: Oral (03/17 1920) BP: 148/87 (03/17 1920) Pulse Rate: 75 (03/17 1920)  Labs:  Recent Labs  05/02/16 0432 05/02/16 1019 05/03/16 0515  05/04/16 0446 05/04/16 1111 05/04/16 1855  HGB 13.4 14.2 12.1  --  12.3  --   --   HCT 39.6 43.0 36.6  --  36.8  --   --   PLT 225 205 170  --  165  --   --   LABPROT  --  15.3*  --   --   --   --   --   INR  --  1.20  --   --   --   --   --   HEPARINUNFRC 0.62 0.68 0.44  < > 0.83* 0.76* 0.63  CREATININE 0.64 0.76  --   --  0.86  --   --   < > = values in this interval not displayed.  Estimated Creatinine Clearance: 75.3 mL/min (by C-G formula based on SCr of 0.86 mg/dL).  Assessment: Pharmacy consulted to dose and monitor heparin drip in this 66 year old female admitted with new onset atrial fibrillation. Obtained patient's height from RN/patient. Patient was not taking anticoagulation prior to admission.  Goal of Therapy: Heparin level 0.3-0.7 units/ml Monitor platelets by anticoagulation protocol: Yes  Plan: HL = 0.62(therapeutic). Will continue heparin at current rate of 1150 units/hr and recheck HL and CBC tomorrow morning with AM labs.  3/17 @ 0500 HL 0.83 supratherapeutic, will decrease heparin drip rate to 950 units/hour and will recheck HL @ 1100.  3/17 HL @ 1111= 0.76. Will decrease to drip to 900 units/hr. Will recheck HL in 6 hrs.  3/17 1855 HL therapeutic. Continue current rate. Will recheck HL in 6 hours.  Laural Benes, Pharm.D., BCPS Clinical Pharmacist 05/04/2016,8:10 PM

## 2016-05-04 NOTE — Progress Notes (Signed)
ANTICOAGULATION CONSULT NOTE - Initial Consult  Pharmacy Consult for heparin drip  Indication: atrial fibrillation  No Known Allergies  Patient Measurements: Height: 5\' 6"  (167.6 cm) Weight: 216 lb (98 kg) IBW/kg (Calculated) : 59.3 Height: 5 foot 6 inches per RN/patient Heparin Dosing Weight: 81 kg  Vital Signs: Temp: 98.2 F (36.8 C) (03/14 2031) Temp Source: Oral (03/14 2031) BP: 162/90 (03/14 2031) Pulse Rate: 97 (03/14 2031)  Labs:  Recent Labs (last 2 labs)    Recent Labs  05/01/16 0616 05/01/16 0715 05/01/16 0930 05/01/16 1241 05/01/16 1621 05/01/16 2203  HGB --  13.2 --  --  --  --   HCT --  39.1 --  --  --  --   PLT --  238 --  --  --  --   APTT --  --  32 --  --  --   LABPROT --  --  14.6 --  --  --   INR --  --  1.13 --  --  --   HEPARINUNFRC --  --  --  --  0.54 0.63  CREATININE 0.94 --  --  --  --  --   TROPONINI 0.19* --  --  3.51* 3.52* --       Estimated Creatinine Clearance: 70.5 mL/min (by C-G formula based on SCr of 0.94 mg/dL).   Medical History:     Past Medical History:  Diagnosis Date  . Arthritis   . Hypertension    Medications:         Prescriptions Prior to Admission  Medication Sig Dispense Refill Last Dose  . aspirin EC 81 MG tablet Take 81 mg by mouth daily.   04/30/2016 at am  . olmesartan-hydrochlorothiazide (BENICAR HCT) 40-25 MG tablet Take 1 tablet by mouth daily.   Past Month at Unknown time   Scheduled:  . diltiazem 10 mg Intravenous Once  . furosemide 40 mg Intravenous BID  . hydrALAZINE 50 mg Oral Q8H  . insulin aspart 0-5 Units Subcutaneous QHS  . insulin aspart 0-9 Units Subcutaneous TID WC  . lisinopril 10 mg Oral Daily  . metoprolol succinate 25 mg Oral Daily  . sodium chloride flush 3 mL Intravenous Q12H  . sodium chloride flush 3 mL Intravenous Q12H   Infusions:  . heparin 1,150 Units/hr (05/01/16 1027)     Assessment: Pharmacy consulted to dose and monitor heparin drip in this 66 year old female admitted with new onset atrial fibrillation. Obtained patient's height from RN/patient. Patient was not taking anticoagulation prior to admission.  Goal of Therapy: Heparin level 0.3-0.7 units/ml Monitor platelets by anticoagulation protocol: Yes  Plan: HL = 0.62 (therapeutic). Will continue heparin at current rate of 1150 units/hr and recheck HL and CBC tomorrow morning with AM labs.  3/17 @ 0500 HL 0.83 supratherapeutic, will decrease heparin drip rate to 950 units/hour and will recheck HL @ 1100.  Thank you for this consult.  Tobie Lords, PharmD, BCPS Clinical Pharmacist 05/04/2016

## 2016-05-04 NOTE — Progress Notes (Signed)
SUBJECTIVE: Pt reports feeling well, no chest pain and minimal SOB.    Vitals:   05/03/16 1511 05/03/16 1955 05/04/16 0450 05/04/16 0523  BP:  139/80 124/82   Pulse:  99 81   Resp:  16 16   Temp:  99.3 F (37.4 C) 98.7 F (37.1 C)   TempSrc:  Oral Oral   SpO2: 94% 95% 94% 93%  Weight:   206 lb 14.4 oz (93.8 kg)   Height:        Intake/Output Summary (Last 24 hours) at 05/04/16 0740 Last data filed at 05/04/16 0452  Gross per 24 hour  Intake                0 ml  Output             1900 ml  Net            -1900 ml    LABS: Basic Metabolic Panel:  Recent Labs  05/01/16 0930  05/02/16 1019 05/04/16 0446  NA  --   < > 139 138  K  --   < > 3.7 3.3*  CL  --   < > 101 99*  CO2  --   < > 26 30  GLUCOSE  --   < > 102* 104*  BUN  --   < > 24* 34*  CREATININE  --   < > 0.76 0.86  CALCIUM  --   < > 8.7* 8.1*  MG 1.6*  --   --   --   < > = values in this interval not displayed. Liver Function Tests: No results for input(s): AST, ALT, ALKPHOS, BILITOT, PROT, ALBUMIN in the last 72 hours. No results for input(s): LIPASE, AMYLASE in the last 72 hours. CBC:  Recent Labs  05/03/16 0515 05/04/16 0446  WBC 6.7 4.2  HGB 12.1 12.3  HCT 36.6 36.8  MCV 86.2 86.1  PLT 170 165   Cardiac Enzymes:  Recent Labs  05/01/16 1241 05/01/16 1621  TROPONINI 3.51* 3.52*   BNP: Invalid input(s): POCBNP D-Dimer: No results for input(s): DDIMER in the last 72 hours. Hemoglobin A1C:  Recent Labs  05/01/16 0930  HGBA1C 5.9*   Fasting Lipid Panel:  Recent Labs  05/02/16 0432  CHOL 178  HDL 44  LDLCALC 120*  TRIG 69  CHOLHDL 4.0   Thyroid Function Tests: No results for input(s): TSH, T4TOTAL, T3FREE, THYROIDAB in the last 72 hours.  Invalid input(s): FREET3 Anemia Panel: No results for input(s): VITAMINB12, FOLATE, FERRITIN, TIBC, IRON, RETICCTPCT in the last 72 hours.   PHYSICAL EXAM General: Sitting up in bed, well developed, well nourished, in no acute  distress HEENT:  Normocephalic and atramatic Neck:  No JVD.  Lungs: Significant wheezing bilaterally. Heart: HRRR . Normal S1 and S2 without gallops or murmurs.  Abdomen: Bowel sounds are positive, abdomen soft and non-tender  Extremities: No clubbing, cyanosis or edema.   Neuro: Alert and oriented X 3. Psych:  Good affect, responds appropriately  TELEMETRY: Atrial fibrillation 98bpm, bundle branch block.  ASSESSMENT AND PLAN: Status post acute respiratory failure and acute CHF and acute MI. Unable to perform cardiac cath due to flu diagnosis and 102F fever.  New Afib and unspecified BBB noted on telemetry this morning.  Cardizem started yesterday, rate is borderline. Blood pressure is stable. Continue all medications. Significant wheezing noted although pt reports feeling well on room air. Careful management of metoprolol advised. Active Problems:   Acute respiratory failure  with hypoxia ([place])   Acute CHF (congestive heart failure) ([place])    Jake Bathe, NP-C 05/04/2016 7:40 AM

## 2016-05-04 NOTE — Progress Notes (Signed)
ANTICOAGULATION CONSULT NOTE - Follow Up Consult  Pharmacy Consult for Heparin drip Indication: atrial fibrillation  No Known Allergies  Patient Measurements: Height: 5\' 6"  (167.6 cm) Weight: 206 lb 14.4 oz (93.8 kg) IBW/kg (Calculated) : 59.3 Heparin Dosing Weight: 80 kg  Vital Signs: Temp: 98 F (36.7 C) (03/17 1122) Temp Source: Oral (03/17 1122) BP: 127/63 (03/17 1122) Pulse Rate: 68 (03/17 1122)  Labs:  Recent Labs  05/01/16 1241 05/01/16 1621  05/02/16 0432 05/02/16 1019 05/03/16 0515 05/03/16 1058 05/04/16 0446 05/04/16 1111  HGB  --   --   < > 13.4 14.2 12.1  --  12.3  --   HCT  --   --   < > 39.6 43.0 36.6  --  36.8  --   PLT  --   --   < > 225 205 170  --  165  --   LABPROT  --   --   --   --  15.3*  --   --   --   --   INR  --   --   --   --  1.20  --   --   --   --   HEPARINUNFRC  --  0.54  < > 0.62 0.68 0.44 0.62 0.83* 0.76*  CREATININE  --   --   --  0.64 0.76  --   --  0.86  --   TROPONINI 3.51* 3.52*  --   --   --   --   --   --   --   < > = values in this interval not displayed.  Estimated Creatinine Clearance: 75.3 mL/min (by C-G formula based on SCr of 0.86 mg/dL).  Assessment: Pharmacy consulted to dose and monitor heparin drip in this 66 year old female admitted with new onset atrial fibrillation. Obtained patient's height from RN/patient. Patient was not taking anticoagulation prior to admission.  Goal of Therapy: Heparin level 0.3-0.7 units/ml Monitor platelets by anticoagulation protocol: Yes  Plan: HL = 0.62(therapeutic). Will continue heparin at current rate of 1150 units/hr and recheck HL and CBC tomorrow morning with AM labs.  3/17 @ 0500 HL 0.83 supratherapeutic, will decrease heparin drip rate to 950 units/hour and will recheck HL @ 1100.  3/17 HL @ 1111= 0.76. Will decrease to drip to 900 units/hr. Will recheck HL in 6 hrs.   Arbor Cohen A 05/04/2016,12:14 PM

## 2016-05-04 NOTE — Progress Notes (Signed)
Ambulated on room air. SATS stayed at 92-93%.

## 2016-05-04 NOTE — Progress Notes (Addendum)
Patient ID: Mackenzie [name], female   DOB: [birth year], 66 y.o.   MRN: [id number]  [facility] PROGRESS NOTE  Mackenzie [name] MBP:[id number] DOB: 12/01/1950 DOA: 05/01/2016 PCP: No PCP Per Patient  HPI/Subjective: Patient feeling better today. Patient breathing better. Still hearing some wheeze. Still with some cough. Family wants her to speak with the dietitian because she eats a lot of ham.  Objective: Vitals:   05/04/16 0450 05/04/16 1122  BP: 124/82 127/63  Pulse: 81 68  Resp: 16 18  Temp: 98.7 F (37.1 C) 98 F (36.7 C)    Filed Weights   05/02/16 0646 05/03/16 0503 05/04/16 0450  Weight: 94.4 kg (208 lb 1.6 oz) 94.1 kg (207 lb 6.4 oz) 93.8 kg (206 lb 14.4 oz)    ROS: Review of Systems  Constitutional: Negative for chills and fever.  Eyes: Negative for blurred vision.  Respiratory: Positive for cough, shortness of breath and wheezing.   Cardiovascular: Negative for chest pain.  Gastrointestinal: Negative for abdominal pain, constipation, diarrhea, nausea and vomiting.  Genitourinary: Negative for dysuria.  Musculoskeletal: Negative for joint pain.  Neurological: Negative for dizziness and headaches.   Exam: Physical Exam  Constitutional: She is oriented to person, place, and time.  HENT:  Nose: No mucosal edema.  Mouth/Throat: No oropharyngeal exudate or posterior oropharyngeal edema.  Eyes: Conjunctivae, EOM and lids are normal. Pupils are equal, round, and reactive to light.  Neck: No JVD present. Carotid bruit is not present. No edema present. No thyroid mass and no thyromegaly present.  Cardiovascular: S1 normal and S2 normal.  An irregularly irregular rhythm present. Exam reveals no gallop.   No murmur heard. Pulses:      Dorsalis pedis pulses are 2+ on the right side, and 2+ on the left side.  Respiratory: No respiratory distress. She has no decreased breath sounds. She has wheezes in the right middle field. She has no rhonchi. She has no rales.  GI: Soft.  Bowel sounds are normal. There is no tenderness.  Musculoskeletal:       Right ankle: She exhibits swelling.       Left ankle: She exhibits swelling.  Lymphadenopathy:    She has no cervical adenopathy.  Neurological: She is alert and oriented to person, place, and time. No cranial nerve deficit.  Skin: Skin is warm. No rash noted. Nails show no clubbing.  Psychiatric: She has a normal mood and affect.      Data Reviewed: Basic Metabolic Panel:  Recent Labs Lab 05/01/16 0616 05/01/16 0930 05/02/16 0432 05/02/16 1019 05/04/16 0446  NA 139  --  139 139 138  K 3.2*  --  3.7 3.7 3.3*  CL 106  --  102 101 99*  CO2 23  --  28 26 30   GLUCOSE 193*  --  110* 102* 104*  BUN 18  --  25* 24* 34*  CREATININE 0.94  --  0.64 0.76 0.86  CALCIUM 8.5*  --  8.5* 8.7* 8.1*  MG  --  1.6*  --   --   --    CBC:  Recent Labs Lab 05/01/16 0715 05/02/16 0432 05/02/16 1019 05/03/16 0515 05/04/16 0446  WBC 16.9* 10.8 10.1 6.7 4.2  NEUTROABS 15.8*  --   --   --   --   HGB 13.2 13.4 14.2 12.1 12.3  HCT 39.1 39.6 43.0 36.6 36.8  MCV 85.6 85.7 87.2 86.2 86.1  PLT 238 225 205 170 165   Cardiac Enzymes:  Recent Labs Lab 05/01/16 0616 05/01/16 1241 05/01/16 1621  TROPONINI 0.19* 3.51* 3.52*   BNP (last 3 results)  Recent Labs  05/01/16 0715  BNP 367.0*     CBG:  Recent Labs Lab 05/03/16 1125 05/03/16 1640 05/03/16 2101 05/04/16 0753 05/04/16 1125  GLUCAP 115* 206* 118* 96 160*     Scheduled Meds: . aspirin EC  81 mg Oral Daily  . atorvastatin  40 mg Oral q1800  . budesonide (PULMICORT) nebulizer solution  0.5 mg Nebulization BID  . diltiazem  120 mg Oral Daily  . furosemide  40 mg Oral Daily  . hydrALAZINE  25 mg Oral Q8H  . insulin aspart  0-5 Units Subcutaneous QHS  . insulin aspart  0-9 Units Subcutaneous TID WC  . ipratropium-albuterol  3 mL Nebulization Q6H  . lisinopril  10 mg Oral Daily  . methylPREDNISolone (SOLU-MEDROL) injection  40 mg Intravenous  Daily  . metoprolol tartrate  25 mg Oral BID  . oseltamivir  75 mg Oral BID  . sodium chloride flush  3 mL Intravenous Q12H  . sodium chloride flush  3 mL Intravenous Q12H   Continuous Infusions: . heparin 900 Units/hr (05/04/16 1224)    Assessment/Plan:  1. Acute respiratory failure with hypoxia. Patient required BiPAP initially on presentation to the ER. Now breathing comfortably on room air.  2. Acute systolic congestive heart failure. Changed Lasix to oral. Continue metoprolol, lisinopril and hydralazine. 3. Acute myocardial infarction. Started aspirin and plavix. Continue Heparin drip. Metoprolol and Lipitor. Cardiac catheterization potentially likely on Monday 4. Influenza B positive. Started Tamiflu. I added Solu-Medrol with wheeze today. 5. Atrial fibrillation with rapid ventricular response. I added Cardizem CD yesterday. Continue low-dose metoprolol. I'd rather have her get the cardiac catheterization here on Monday and then potentially start blood thinner after that. 6. Essential hypertension. Blood pressure much improved from admission. Family would like her to speak with the dietitian  Code Status:     Code Status Orders        Start     Ordered   05/01/16 0932  Full code  Continuous     05/01/16 0931    Code Status History    Date Active Date Inactive Code Status Order ID Comments User Context   This patient has a current code status but no historical code status.     Disposition Plan: Likely will need cardiac catheterization here in the hospital on Monday. Depending on results potentially home Monday afternoon versus Tuesday.  Consultants:  Cardiology  Antibiotics:  Tamiflu  Time spent: 26 Minutes. Permission to speak in front of family at the bedside  Mackenzie [name], [employer]

## 2016-05-05 LAB — BASIC METABOLIC PANEL
Anion gap: 5 (ref 5–15)
BUN: 33 mg/dL — AB (ref 6–20)
CHLORIDE: 102 mmol/L (ref 101–111)
CO2: 29 mmol/L (ref 22–32)
CREATININE: 0.82 mg/dL (ref 0.44–1.00)
Calcium: 8 mg/dL — ABNORMAL LOW (ref 8.9–10.3)
GFR calc Af Amer: >60 mL/min (ref >60)
GFR calc non Af Amer: >60 mL/min (ref >60)
GLUCOSE: 133 mg/dL — AB (ref 65–99)
Potassium: 4 mmol/L (ref 3.5–5.1)
Sodium: 136 mmol/L (ref 135–145)

## 2016-05-05 LAB — CBC
HCT: 37.3 % (ref 35.0–47.0)
Hemoglobin: 12.4 g/dL (ref 12.0–16.0)
MCH: 28.7 pg (ref 26.0–34.0)
MCHC: 33.1 g/dL (ref 32.0–36.0)
MCV: 86.6 fL (ref 80.0–100.0)
Platelets: 180 10*3/uL (ref 150–440)
RBC: 4.31 MIL/uL (ref 3.80–5.20)
RDW: 14.9 % — ABNORMAL HIGH (ref 11.5–14.5)
WBC: 4.4 10*3/uL (ref 3.6–11.0)

## 2016-05-05 LAB — GLUCOSE, CAPILLARY
GLUCOSE-CAPILLARY: 161 mg/dL — AB (ref 65–99)
Glucose-Capillary: 95 mg/dL (ref 65–99)
Glucose-Capillary: 202 mg/dL — ABNORMAL HIGH (ref 65–99)
Glucose-Capillary: 158 mg/dL — ABNORMAL HIGH (ref 65–99)

## 2016-05-05 LAB — MAGNESIUM: Magnesium: 2.1 mg/dL (ref 1.7–2.4)

## 2016-05-05 LAB — HEPARIN LEVEL (UNFRACTIONATED): Heparin Unfractionated: 0.49 IU/mL (ref 0.30–0.70)

## 2016-05-05 NOTE — Progress Notes (Signed)
ANTICOAGULATION CONSULT NOTE - Follow Up Consult  Pharmacy Consult for Heparin drip Indication: atrial fibrillation  No Known Allergies  Patient Measurements: Height: 5\' 6"  (167.6 cm) Weight: 206 lb 14.4 oz (93.8 kg) IBW/kg (Calculated) : 59.3 Heparin Dosing Weight: 80 kg  Vital Signs: Temp: 98.5 F (36.9 C) (03/17 1920) Temp Source: Oral (03/17 1920) BP: 148/87 (03/17 1920) Pulse Rate: 75 (03/17 1920)  Labs:  Recent Labs (last 2 labs)    Recent Labs  05/02/16 0432 05/02/16 1019 05/03/16 0515  05/04/16 0446 05/04/16 1111 05/04/16 1855  HGB 13.4 14.2 12.1  --  12.3  --   --   HCT 39.6 43.0 36.6  --  36.8  --   --   PLT 225 205 170  --  165  --   --   LABPROT  --  15.3*  --   --   --   --   --   INR  --  1.20  --   --   --   --   --   HEPARINUNFRC 0.62 0.68 0.44  < > 0.83* 0.76* 0.63  CREATININE 0.64 0.76  --   --  0.86  --   --   < > = values in this interval not displayed.    Estimated Creatinine Clearance: 75.3 mL/min (by C-G formula based on SCr of 0.86 mg/dL).  Assessment: Pharmacy consulted to dose and monitor heparin drip in this 66 year old female admitted with new onset atrial fibrillation. Obtained patient's height from RN/patient. Patient was not taking anticoagulation prior to admission.  Goal of Therapy: Heparin level 0.3-0.7 units/ml Monitor platelets by anticoagulation protocol: Yes  Plan: HL = 0.62(therapeutic). Will continue heparin at current rate of 1150 units/hr and recheck HL and CBC tomorrow morning with AM labs.  3/17 @ 0500 HL 0.83 supratherapeutic, will decrease heparin drip rate to 950 units/hour and will recheck HL @ 1100.  3/17 HL @ 1111= 0.76. Will decrease to drip to 900 units/hr. Will recheck HL in 6 hrs.  3/17 1855 HL therapeutic. Continue current rate. Will recheck HL in 6 hours.  3/18 0050 HL: 0.49 therapeutic. Will continue current rate and check next level 3/19 @ 0500 w/ am labs.  Thank you for this  consult.  Tobie Lords, PharmD, BCPS Clinical Pharmacist 05/05/2016

## 2016-05-05 NOTE — Progress Notes (Addendum)
Patient ID: Mackenzie [name], female   DOB: [birth year], 66 y.o.   MRN: [id number]  [facility] PROGRESS NOTE  Mackenzie [name] DOB: 1950/09/08 DOA: 05/01/2016 PCP: No PCP Per Patient  HPI/Subjective: Patient feeling better today. Patient breathing better. Still hearing some wheeze. Still with some cough. Family wants her to speak with the dietitian because she eats a lot of ham.  Objective: Vitals:   05/05/16 0421 05/05/16 1115  BP: 138/74 127/78  Pulse: 77 71  Resp: 18 (!) 21  Temp: 98.2 F (36.8 C) 98 F (36.7 C)    Filed Weights   05/03/16 0503 05/04/16 0450 05/05/16 0421  Weight: 94.1 kg (207 lb 6.4 oz) 93.8 kg (206 lb 14.4 oz) 93.8 kg (206 lb 12.8 oz)    ROS: Review of Systems  Constitutional: Negative for chills and fever.  Eyes: Negative for blurred vision.  Respiratory: Positive for cough, shortness of breath and wheezing.   Cardiovascular: Negative for chest pain.  Gastrointestinal: Negative for abdominal pain, constipation, diarrhea, nausea and vomiting.  Genitourinary: Negative for dysuria.  Musculoskeletal: Negative for joint pain.  Neurological: Negative for dizziness and headaches.   Exam: Physical Exam  Constitutional: She is oriented to person, place, and time.  HENT:  Nose: No mucosal edema.  Mouth/Throat: No oropharyngeal exudate or posterior oropharyngeal edema.  Eyes: Conjunctivae, EOM and lids are normal. Pupils are equal, round, and reactive to light.  Neck: No JVD present. Carotid bruit is not present. No edema present. No thyroid mass and no thyromegaly present.  Cardiovascular: S1 normal and S2 normal.  An irregularly irregular rhythm present. Exam reveals no gallop.   No murmur heard. Pulses:      Dorsalis pedis pulses are 2+ on the right side, and 2+ on the left side.  Respiratory: No respiratory distress. She has no decreased breath sounds. She has no wheezes. She has no rhonchi. She has no rales.  GI: Soft. Bowel sounds are  normal. There is no tenderness.  Musculoskeletal:       Right ankle: She exhibits swelling.       Left ankle: She exhibits swelling.  Lymphadenopathy:    She has no cervical adenopathy.  Neurological: She is alert and oriented to person, place, and time. No cranial nerve deficit.  Skin: Skin is warm. No rash noted. Nails show no clubbing.  Psychiatric: She has a normal mood and affect.      Data Reviewed: Basic Metabolic Panel:  Recent Labs Lab 05/01/16 0616 05/01/16 0930 05/02/16 0432 05/02/16 1019 05/04/16 0446 05/05/16 0050  NA 139  --  139 139 138 136  K 3.2*  --  3.7 3.7 3.3* 4.0  CL 106  --  102 101 99* 102  CO2 23  --  28 26 30 29   GLUCOSE 193*  --  110* 102* 104* 133*  BUN 18  --  25* 24* 34* 33*  CREATININE 0.94  --  0.64 0.76 0.86 0.82  CALCIUM 8.5*  --  8.5* 8.7* 8.1* 8.0*  MG  --  1.6*  --   --   --  2.1   CBC:  Recent Labs Lab 05/01/16 0715 05/02/16 0432 05/02/16 1019 05/03/16 0515 05/04/16 0446 05/05/16 0050  WBC 16.9* 10.8 10.1 6.7 4.2 4.4  NEUTROABS 15.8*  --   --   --   --   --   HGB 13.2 13.4 14.2 12.1 12.3 12.4  HCT 39.1 39.6 43.0 36.6 36.8 37.3  MCV 85.6  85.7 87.2 86.2 86.1 86.6  PLT 238 225 205 170 165 180   Cardiac Enzymes:  Recent Labs Lab 05/01/16 0616 05/01/16 1241 05/01/16 1621  TROPONINI 0.19* 3.51* 3.52*   BNP (last 3 results)  Recent Labs  05/01/16 0715  BNP 367.0*     CBG:  Recent Labs Lab 05/04/16 1125 05/04/16 1638 05/04/16 2050 05/05/16 0735 05/05/16 1116  GLUCAP 160* 185* 143* 95 161*     Scheduled Meds: . aspirin EC  81 mg Oral Daily  . atorvastatin  40 mg Oral q1800  . budesonide (PULMICORT) nebulizer solution  0.5 mg Nebulization BID  . diltiazem  120 mg Oral Daily  . furosemide  20 mg Oral Daily  . hydrALAZINE  10 mg Oral Q8H  . insulin aspart  0-5 Units Subcutaneous QHS  . insulin aspart  0-9 Units Subcutaneous TID WC  . ipratropium-albuterol  3 mL Nebulization Q6H  . lisinopril  10 mg  Oral Daily  . methylPREDNISolone (SOLU-MEDROL) injection  40 mg Intravenous Daily  . metoprolol tartrate  25 mg Oral BID  . oseltamivir  75 mg Oral BID  . sodium chloride flush  3 mL Intravenous Q12H  . sodium chloride flush  3 mL Intravenous Q12H   Continuous Infusions: . heparin 900 Units/hr (05/05/16 0427)    Assessment/Plan:  1. Acute respiratory failure with hypoxia. Patient required BiPAP initially on presentation to the ER. Now breathing comfortably on room air.  2. Acute systolic congestive heart failure. Changed Lasix to oral. Continue metoprolol, lisinopril and hydralazine. 3. Acute myocardial infarction. Started aspirin, plavix, Heparin drip, Metoprolol and Lipitor. Cardiac catheterization on Monday.  4. Influenza B positive. Started Tamiflu. Will discontinue Solu-Medrol, since the lungs are clear 5. Atrial fibrillation with rapid ventricular response. Patient rate controlled today with metoprolol andCardizem CD. I'd rather have her get the cardiac catheterization here on Monday and then potentially start blood thinner after that. 6. Essential hypertension. Blood pressure much improved from admission.   Code Status:     Code Status Orders        Start     Ordered   05/01/16 0932  Full code  Continuous     05/01/16 0931    Code Status History    Date Active Date Inactive Code Status Order ID Comments User Context   This patient has a current code status but no historical code status.     Disposition Plan: Likely will need cardiac catheterization here in the hospital on Monday. Depending on results potentially home Monday afternoon versus Tuesday.  Consultants:  Cardiology  Antibiotics:  Tamiflu  Time spent: 24 Minutes.   Loletha Grayer  [employer]

## 2016-05-05 NOTE — Progress Notes (Signed)
SUBJECTIVE: Pt reports feeling well. Recovering well from flu.    Vitals:   05/04/16 1122 05/04/16 1920 05/04/16 2000 05/05/16 0421  BP: 127/63 (!) 148/87  138/74  Pulse: 68 75  77  Resp: 18 16  18   Temp: 98 F (36.7 C) 98.5 F (36.9 C)  98.2 F (36.8 C)  TempSrc: Oral Oral  Oral  SpO2: 94% 96% 96% 93%  Weight:    206 lb 12.8 oz (93.8 kg)  Height:        Intake/Output Summary (Last 24 hours) at 05/05/16 0719 Last data filed at 05/05/16 0536  Gross per 24 hour  Intake                0 ml  Output                0 ml  Net                0 ml    LABS: Basic Metabolic Panel:  Recent Labs  05/04/16 0446 05/05/16 0050  NA 138 136  K 3.3* 4.0  CL 99* 102  CO2 30 29  GLUCOSE 104* 133*  BUN 34* 33*  CREATININE 0.86 0.82  CALCIUM 8.1* 8.0*  MG  --  2.1   Liver Function Tests: No results for input(s): AST, ALT, ALKPHOS, BILITOT, PROT, ALBUMIN in the last 72 hours. No results for input(s): LIPASE, AMYLASE in the last 72 hours. CBC:  Recent Labs  05/04/16 0446 05/05/16 0050  WBC 4.2 4.4  HGB 12.3 12.4  HCT 36.8 37.3  MCV 86.1 86.6  PLT 165 180   Cardiac Enzymes: No results for input(s): CKTOTAL, CKMB, CKMBINDEX, TROPONINI in the last 72 hours. BNP: Invalid input(s): POCBNP D-Dimer: No results for input(s): DDIMER in the last 72 hours. Hemoglobin A1C: No results for input(s): HGBA1C in the last 72 hours. Fasting Lipid Panel: No results for input(s): CHOL, HDL, LDLCALC, TRIG, CHOLHDL, LDLDIRECT in the last 72 hours. Thyroid Function Tests: No results for input(s): TSH, T4TOTAL, T3FREE, THYROIDAB in the last 72 hours.  Invalid input(s): FREET3 Anemia Panel: No results for input(s): VITAMINB12, FOLATE, FERRITIN, TIBC, IRON, RETICCTPCT in the last 72 hours.   PHYSICAL EXAM General: Well developed, well nourished, in no acute distress HEENT:  Normocephalic and atramatic Neck:  No JVD.  Lungs: Clear bilaterally to auscultation and percussion. Heart: HRRR .  Normal S1 and S2 without gallops or murmurs.  Abdomen: Bowel sounds are positive, abdomen soft and non-tender  Msk:  Back normal, normal gait. Normal strength and tone for age. Extremities: No clubbing, cyanosis or edema.   Neuro: Alert and oriented X 3. Psych:  Good affect, responds appropriately  TELEMETRY: NSR, 90 bpm BBB  ASSESSMENT AND PLAN: Pt is recovering well from the flu, no fever at this time and mild wheezing but stable on room air. Cardiac cath possibly Monday.  Blood pressure remains well controlled on current medication. Continue metoprolol, aspirin, lasix, lisinopril, and hydralazine.   Active Problems:   Acute respiratory failure with hypoxia (HCC)   Acute CHF (congestive heart failure) ([place])    Mackenzie Bathe, NP-C 05/05/2016 7:19 AM

## 2016-05-05 NOTE — Progress Notes (Signed)
SATURATION QUALIFICATIONS: (This note is used to comply with regulatory documentation for home oxygen)  Patient Saturations on Room Air at Rest = 95%  Patient Saturations on Room Air while Ambulating = 92%  Patient Saturations on  Liters of oxygen while Ambulating = %  Please briefly explain why patient needs home oxygen: does not qualify

## 2016-05-06 ENCOUNTER — Encounter
Admission: EM | Disposition: A | Discharge status: Home / Self Care | Payer: Self-pay | Source: Home / Self Care | Attending: Internal Medicine

## 2016-05-06 HISTORY — PX: LEFT HEART CATH AND CORONARY ANGIOGRAPHY: CATH118249

## 2016-05-06 LAB — BASIC METABOLIC PANEL
ANION GAP: 9 (ref 5–15)
ANION GAP: 8 (ref 5–15)
BUN: 37 mg/dL — ABNORMAL HIGH (ref 6–20)
BUN: 35 mg/dL — ABNORMAL HIGH (ref 6–20)
CALCIUM: 8.3 mg/dL — AB (ref 8.9–10.3)
CALCIUM: 8.4 mg/dL — AB (ref 8.9–10.3)
CO2: 26 mmol/L (ref 22–32)
CO2: 26 mmol/L (ref 22–32)
CREATININE: 0.76 mg/dL (ref 0.44–1.00)
Chloride: 101 mmol/L (ref 101–111)
Chloride: 103 mmol/L (ref 101–111)
Creatinine, Ser: 0.88 mg/dL (ref 0.44–1.00)
GFR calc non Af Amer: >60 mL/min (ref >60)
Glucose, Bld: 115 mg/dL — ABNORMAL HIGH (ref 65–99)
Glucose, Bld: 93 mg/dL (ref 65–99)
Potassium: 4.6 mmol/L (ref 3.5–5.1)
Potassium: 4.2 mmol/L (ref 3.5–5.1)
SODIUM: 137 mmol/L (ref 135–145)
Sodium: 136 mmol/L (ref 135–145)

## 2016-05-06 LAB — PROTIME-INR
INR: 1.07
PROTHROMBIN TIME: 13.9 s (ref 11.4–15.2)

## 2016-05-06 LAB — CBC
HCT: 38.3 % (ref 35.0–47.0)
Hemoglobin: 12.9 g/dL (ref 12.0–16.0)
MCH: 28.8 pg (ref 26.0–34.0)
MCHC: 33.7 g/dL (ref 32.0–36.0)
MCV: 85.6 fL (ref 80.0–100.0)
PLATELETS: 197 10*3/uL (ref 150–440)
RBC: 4.47 MIL/uL (ref 3.80–5.20)
RDW: 15.1 % — AB (ref 11.5–14.5)
WBC: 7.2 10*3/uL (ref 3.6–11.0)

## 2016-05-06 LAB — HEPARIN LEVEL (UNFRACTIONATED): HEPARIN UNFRACTIONATED: 0.41 [IU]/mL (ref 0.30–0.70)

## 2016-05-06 LAB — GLUCOSE, CAPILLARY
GLUCOSE-CAPILLARY: 96 mg/dL (ref 65–99)
GLUCOSE-CAPILLARY: 129 mg/dL — AB (ref 65–99)
Glucose-Capillary: 98 mg/dL (ref 65–99)
Glucose-Capillary: 105 mg/dL — ABNORMAL HIGH (ref 65–99)

## 2016-05-06 SURGERY — LEFT HEART CATH AND CORONARY ANGIOGRAPHY
Anesthesia: Moderate Sedation | Laterality: Right

## 2016-05-06 MED ORDER — ASPIRIN 81 MG PO CHEW: 81.0000 mg | CHEWABLE_TABLET | ORAL | Status: AC

## 2016-05-06 MED ORDER — APIXABAN 5 MG PO TABS
5.0000 mg | ORAL_TABLET | Freq: Two times a day (BID) | ORAL | Status: DC
  Administered 2016-05-06 – 2016-05-07 (×2): 5 mg via ORAL
  Filled 2016-05-06 (×3): qty 1

## 2016-05-06 MED ORDER — FENTANYL CITRATE (PF) 100 MCG/2ML IJ SOLN
INTRAMUSCULAR | Status: AC
  Filled 2016-05-06: qty 2

## 2016-05-06 MED ORDER — LIDOCAINE HCL (PF) 1 % IJ SOLN
INTRAMUSCULAR | Status: DC | PRN
  Administered 2016-05-06: 15 mL via SUBCUTANEOUS

## 2016-05-06 MED ORDER — HEPARIN (PORCINE) IN NACL 2-0.9 UNIT/ML-% IJ SOLN
INTRAMUSCULAR | Status: AC
  Filled 2016-05-06: qty 1000

## 2016-05-06 MED ORDER — ONDANSETRON HCL 4 MG/2ML IJ SOLN: 4.0000 mg | Freq: Four times a day (QID) | INTRAMUSCULAR | Status: DC | PRN

## 2016-05-06 MED ORDER — FENTANYL CITRATE (PF) 100 MCG/2ML IJ SOLN
INTRAMUSCULAR | Status: DC | PRN
  Administered 2016-05-06: 50 ug via INTRAVENOUS

## 2016-05-06 MED ORDER — IPRATROPIUM-ALBUTEROL 0.5-2.5 (3) MG/3ML IN SOLN
3.0000 mL | Freq: Two times a day (BID) | RESPIRATORY_TRACT | Status: DC
  Administered 2016-05-06 – 2016-05-07 (×2): 3 mL via RESPIRATORY_TRACT
  Filled 2016-05-06 (×2): qty 3

## 2016-05-06 MED ORDER — SODIUM CHLORIDE 0.9 % IV SOLN: 250.0000 mL | INTRAVENOUS | Status: DC | PRN

## 2016-05-06 MED ORDER — SODIUM CHLORIDE 0.9% FLUSH
3.0000 mL | Freq: Two times a day (BID) | INTRAVENOUS | Status: DC
  Administered 2016-05-07: 3 mL via INTRAVENOUS

## 2016-05-06 MED ORDER — SODIUM CHLORIDE 0.9 % WEIGHT BASED INFUSION
1.0000 mL/kg/h | INTRAVENOUS | Status: AC
  Administered 2016-05-06: 1 mL/kg/h via INTRAVENOUS

## 2016-05-06 MED ORDER — MIDAZOLAM HCL 2 MG/2ML IJ SOLN
INTRAMUSCULAR | Status: DC | PRN
  Administered 2016-05-06: 1 mg via INTRAVENOUS

## 2016-05-06 MED ORDER — SODIUM CHLORIDE 0.9% FLUSH: 3.0000 mL | INTRAVENOUS | Status: DC | PRN

## 2016-05-06 MED ORDER — LIDOCAINE HCL (PF) 1 % IJ SOLN
INTRAMUSCULAR | Status: AC
  Filled 2016-05-06: qty 30

## 2016-05-06 MED ORDER — SODIUM CHLORIDE 0.9 % WEIGHT BASED INFUSION
3.0000 mL/kg/h | INTRAVENOUS | Status: DC
  Administered 2016-05-06: 3 mL/kg/h via INTRAVENOUS

## 2016-05-06 MED ORDER — ACETAMINOPHEN 325 MG PO TABS: 650.0000 mg | ORAL_TABLET | ORAL | Status: DC | PRN

## 2016-05-06 MED ORDER — IOPAMIDOL (ISOVUE-300) INJECTION 61%
INTRAVENOUS | Status: DC | PRN
  Administered 2016-05-06: 100 mL via INTRA_ARTERIAL

## 2016-05-06 MED ORDER — MIDAZOLAM HCL 2 MG/2ML IJ SOLN
INTRAMUSCULAR | Status: AC
  Filled 2016-05-06: qty 2

## 2016-05-06 MED ORDER — SODIUM CHLORIDE 0.9 % WEIGHT BASED INFUSION: 1.0000 mL/kg/h | INTRAVENOUS | Status: DC

## 2016-05-06 SURGICAL SUPPLY — 9 items
CATH INFINITI 5FR ANG PIGTAIL (CATHETERS) ×3 IMPLANT
CATH INFINITI 5FR JL4 (CATHETERS) ×3 IMPLANT
CATH INFINITI JR4 5F (CATHETERS) ×3 IMPLANT
DEVICE CLOSURE MYNXGRIP 5F (Vascular Products) ×3 IMPLANT
KIT MANI 3VAL PERCEP (MISCELLANEOUS) ×3 IMPLANT
NEEDLE PERC 18GX7CM (NEEDLE) ×3 IMPLANT
PACK CARDIAC CATH (CUSTOM PROCEDURE TRAY) ×3 IMPLANT
SHEATH PINNACLE 5F 10CM (SHEATH) ×3 IMPLANT
WIRE EMERALD 3MM-J .035X150CM (WIRE) ×3 IMPLANT

## 2016-05-06 NOTE — Care Management (Signed)
Informed that patient will be discharged home on Eliquis.  Patient  had informed this CM earlier in her stay that she does have pharmacy coverage with her medicare.  Left Eliquis coupon for 30 day free in patient's room.  Informed during progression that patient is no longer requiring oxygen

## 2016-05-06 NOTE — Progress Notes (Signed)
SUBJECTIVE: Patient denies any chest pain but had atrial fibrillation over the weekend   Vitals:   05/05/16 2047 05/06/16 0223 05/06/16 0440 05/06/16 0734  BP: (!) 158/88  (!) 147/73 (!) 151/94  Pulse: 76  73 77  Resp: 16  18 17   Temp: 97.3 F (36.3 C)  98.3 F (36.8 C) 97.8 F (36.6 C)  TempSrc: Oral  Oral Oral  SpO2: 98% 98% 98% 96%  Weight:   206 lb 12.8 oz (93.8 kg)   Height:        Intake/Output Summary (Last 24 hours) at 05/06/16 0839 Last data filed at 05/06/16 0739  Gross per 24 hour  Intake           745.16 ml  Output              700 ml  Net            45.16 ml    LABS: Basic Metabolic Panel:  Recent Labs  05/05/16 0050 05/06/16 0432  NA 136 136  K 4.0 4.6  CL 102 101  CO2 29 26  GLUCOSE 133* 115*  BUN 33* 37*  CREATININE 0.82 0.88  CALCIUM 8.0* 8.3*  MG 2.1  --    Liver Function Tests: No results for input(s): AST, ALT, ALKPHOS, BILITOT, PROT, ALBUMIN in the last 72 hours. No results for input(s): LIPASE, AMYLASE in the last 72 hours. CBC:  Recent Labs  05/04/16 0446 05/05/16 0050  WBC 4.2 4.4  HGB 12.3 12.4  HCT 36.8 37.3  MCV 86.1 86.6  PLT 165 180   Cardiac Enzymes: No results for input(s): CKTOTAL, CKMB, CKMBINDEX, TROPONINI in the last 72 hours. BNP: Invalid input(s): POCBNP D-Dimer: No results for input(s): DDIMER in the last 72 hours. Hemoglobin A1C: No results for input(s): HGBA1C in the last 72 hours. Fasting Lipid Panel: No results for input(s): CHOL, HDL, LDLCALC, TRIG, CHOLHDL, LDLDIRECT in the last 72 hours. Thyroid Function Tests: No results for input(s): TSH, T4TOTAL, T3FREE, THYROIDAB in the last 72 hours.  Invalid input(s): FREET3 Anemia Panel: No results for input(s): VITAMINB12, FOLATE, FERRITIN, TIBC, IRON, RETICCTPCT in the last 72 hours.   PHYSICAL EXAM General: Well developed, well nourished, in no acute distress HEENT:  Normocephalic and atramatic Neck:  No JVD.  Lungs: Clear bilaterally to auscultation  and percussion. Heart: HRRR . Normal S1 and S2 without gallops or murmurs.  Abdomen: Bowel sounds are positive, abdomen soft and non-tender  Msk:  Back normal, normal gait. Normal strength and tone for age. Extremities: No clubbing, cyanosis or edema.   Neuro: Alert and oriented X 3. Psych:  Good affect, responds appropriately  TELEMETRY:Sinus rhythm  ASSESSMENT AND PLAN: Congestive heart failure/atrial fibrillation and elevated troponin up to 3.75 which led to ruling in for non-STEMI. Patient developed slow and cardiac catheterization was postponed on Thursday and Friday and will do it today. Patient was explained risks and benefits and patient has agreed to the procedure.  Active Problems:   Acute respiratory failure with hypoxia (HCC)   Acute CHF (congestive heart failure) (HCC)    Neoma Laming A, MD, [facility] 05/06/2016 8:39 AM

## 2016-05-06 NOTE — Progress Notes (Signed)
Pt returned from cath lab, dressing to rt groin dry and intact.  Pulses equal bilaterally. No distress on ra.  IVF Infusing well rt hand.

## 2016-05-06 NOTE — Plan of Care (Signed)
Problem: Food- and Nutrition-Related Knowledge Deficit (NB-1.1) Goal: Nutrition education Formal process to instruct or train a patient/client in a skill or to impart knowledge to help patients/clients voluntarily manage or modify food choices and eating behavior to maintain or improve health.  Outcome: Completed/Met Date Met: 05/06/16 Nutrition Education Note  RD consulted for nutrition education regarding new onset CHF.  RD provided "Low Sodium Nutrition Therapy" handout from the Academy of [employer]. Reviewed patient's dietary recall. Provided examples on ways to decrease sodium intake in diet. Discouraged intake of processed foods and use of salt shaker. Encouraged fresh fruits and vegetables as well as whole grain sources of carbohydrates to maximize fiber intake.   RD discussed why it is important for patient to adhere to diet recommendations, and emphasized the role of fluids, foods to avoid, and importance of weighing self daily. Teach back method used.   RD consulted for nutrition education regarding diabetes.   Lab Results  Component Value Date   HGBA1C 5.9 (H) 05/01/2016    RD provided "Carbohydrate Counting for People with Diabetes" handout from the Academy of Nutrition and Dietetics. Discussed different food groups and their effects on blood sugar, emphasizing carbohydrate-containing foods. Provided list of carbohydrates and recommended serving sizes of common foods.  Discussed importance of controlled and consistent carbohydrate intake throughout the day. Provided examples of ways to balance meals/snacks and encouraged intake of high-fiber, whole grain complex carbohydrates. Teach back method used.  Expect fair compliance.  Body mass index is 33.38 kg/m. Pt meets criteria for obese class I based on current BMI.  Current diet order is NPO, patient is consuming approximately 100% of meals at this time. Labs and medications reviewed. No further nutrition  interventions warranted at this time. RD contact information provided. If additional nutrition issues arise, please re-consult RD.   Satira Anis. Napolean Sia, MS, RD LDN Inpatient Clinical Dietitian Pager [phone]

## 2016-05-06 NOTE — Progress Notes (Signed)
ANTICOAGULATION CONSULT NOTE - Follow Up Consult  Pharmacy Consult for Heparin drip Indication: atrial fibrillation  No Known Allergies  Patient Measurements: Height: 5\' 6"  (167.6 cm) Weight: 206 lb 14.4 oz (93.8 kg) IBW/kg (Calculated) : 59.3 Heparin Dosing Weight: 80 kg  Vital Signs: Temp: 98.5 F (36.9 C) (03/17 1920) Temp Source: Oral (03/17 1920) BP: 148/87 (03/17 1920) Pulse Rate: 75 (03/17 1920)  Labs:  Recent Labs (last 2 labs)    Recent Labs  05/02/16 0432 05/02/16 1019 05/03/16 0515  05/04/16 0446 05/04/16 1111 05/04/16 1855  HGB 13.4 14.2 12.1 --  12.3 --  --   HCT 39.6 43.0 36.6 --  36.8 --  --   PLT 225 205 170 --  165 --  --   LABPROT --  15.3* --  --  --  --  --   INR --  1.20 --  --  --  --  --   HEPARINUNFRC 0.62 0.68 0.44 <> 0.83* 0.76* 0.63  CREATININE 0.64 0.76 --  --  0.86 --  --   < > = values in this interval not displayed.    Estimated Creatinine Clearance: 75.3 mL/min (by C-G formula based on SCr of 0.86 mg/dL).  Assessment: Pharmacy consulted to dose and monitor heparin drip in this 66 year old female admitted with new onset atrial fibrillation. Obtained patient's height from RN/patient. Patient was not taking anticoagulation prior to admission.  Goal of Therapy: Heparin level 0.3-0.7 units/ml Monitor platelets by anticoagulation protocol: Yes  Plan: HL = 0.62(therapeutic). Will continue heparin at current rate of 1150 units/hr and recheck HL and CBC tomorrow morning with AM labs.  3/17 @ 0500 HL 0.83 supratherapeutic, will decrease heparin drip rate to 950 units/hour and will recheck HL @ 1100.  3/17 HL @ 1111= 0.76. Will decrease to drip to 900 units/hr. Will recheck HL in 6 hrs.  3/17 1855 HL therapeutic. Continue current rate. Will recheck HL in 6 hours.  3/18 0050 HL: 0.49 therapeutic. Will continue current rate and check next level 3/19 @ 0500 w/ am labs.  3/19 0432 HL: 0.41  therapeutic. Will continue current rate and will recheck next HL w/ am labs.  Thank you for this consult.  Tobie Lords, PharmD, BCPS Clinical Pharmacist 05/06/2016

## 2016-05-06 NOTE — Progress Notes (Signed)
To cath lab via bed.

## 2016-05-06 NOTE — Care Management Important Message (Signed)
Important Message  Patient Details  Name: Mackenzie [name] MRN: [id number] Date of Birth: 1950/08/02   Medicare Important Message Given:  Yes    Katrina Stack, RN 05/06/2016, 8:07 AM

## 2016-05-06 NOTE — Progress Notes (Signed)
Patient ID: Mackenzie [name], female   DOB: Nov 25, 1950, 66 y.o.   MRN: [id number]  [facility] PROGRESS NOTE  Mackenzie [name] [id number]:[id number] DOB: March 17, 1950 DOA: 05/01/2016 PCP: No PCP Per Patient  HPI/Subjective: Patient states she wheezes when she is lying flat.  Breathing better, no complaints of chest pain.  Objective: Vitals:   05/06/16 0440 05/06/16 0734  BP: (!) 147/73 (!) 151/94  Pulse: 73 77  Resp: 18 17  Temp: 98.3 F (36.8 C) 97.8 F (36.6 C)    Filed Weights   05/04/16 0450 05/05/16 0421 05/06/16 0440  Weight: 93.8 kg (206 lb 14.4 oz) 93.8 kg (206 lb 12.8 oz) 93.8 kg (206 lb 12.8 oz)    ROS: Review of Systems  Constitutional: Negative for chills and fever.  Eyes: Negative for blurred vision.  Respiratory: Positive for wheezing. Negative for cough and shortness of breath.   Cardiovascular: Negative for chest pain.  Gastrointestinal: Negative for abdominal pain, constipation, diarrhea, nausea and vomiting.  Genitourinary: Negative for dysuria.  Musculoskeletal: Negative for joint pain.  Neurological: Negative for dizziness and headaches.   Exam: Physical Exam  Constitutional: She is oriented to person, place, and time.  HENT:  Nose: No mucosal edema.  Mouth/Throat: No oropharyngeal exudate or posterior oropharyngeal edema.  Eyes: Conjunctivae, EOM and lids are normal. Pupils are equal, round, and reactive to light.  Neck: No JVD present. Carotid bruit is not present. No edema present. No thyroid mass and no thyromegaly present.  Cardiovascular: S1 normal and S2 normal.  An irregularly irregular rhythm present. Exam reveals no gallop.   No murmur heard. Pulses:      Dorsalis pedis pulses are 2+ on the right side, and 2+ on the left side.  Respiratory: No respiratory distress. She has no decreased breath sounds. She has no wheezes. She has no rhonchi. She has no rales.  GI: Soft. Bowel sounds are normal. There is no tenderness.  Musculoskeletal:       Right  ankle: She exhibits swelling.       Left ankle: She exhibits swelling.  Lymphadenopathy:    She has no cervical adenopathy.  Neurological: She is alert and oriented to person, place, and time. No cranial nerve deficit.  Skin: Skin is warm. No rash noted. Nails show no clubbing.  Psychiatric: She has a normal mood and affect.      Data Reviewed: Basic Metabolic Panel:  Recent Labs Lab 05/01/16 0930 05/02/16 0432 05/02/16 1019 05/04/16 0446 05/05/16 0050 05/06/16 0432  NA  --  139 139 138 136 136  K  --  3.7 3.7 3.3* 4.0 4.6  CL  --  102 101 99* 102 101  CO2  --  28 26 30 29 26   GLUCOSE  --  110* 102* 104* 133* 115*  BUN  --  25* 24* 34* 33* 37*  CREATININE  --  0.64 0.76 0.86 0.82 0.88  CALCIUM  --  8.5* 8.7* 8.1* 8.0* 8.3*  MG 1.6*  --   --   --  2.1  --    CBC:  Recent Labs Lab 05/01/16 0715 05/02/16 0432 05/02/16 1019 05/03/16 0515 05/04/16 0446 05/05/16 0050  WBC 16.9* 10.8 10.1 6.7 4.2 4.4  NEUTROABS 15.8*  --   --   --   --   --   HGB 13.2 13.4 14.2 12.1 12.3 12.4  HCT 39.1 39.6 43.0 36.6 36.8 37.3  MCV 85.6 85.7 87.2 86.2 86.1 86.6  PLT 238 225 205  170 165 180   Cardiac Enzymes:  Recent Labs Lab 05/01/16 0616 05/01/16 1241 05/01/16 1621  TROPONINI 0.19* 3.51* 3.52*   BNP (last 3 results)  Recent Labs  05/01/16 0715  BNP 367.0*     CBG:  Recent Labs Lab 05/04/16 2050 05/05/16 0735 05/05/16 1116 05/05/16 1651 05/05/16 2103  GLUCAP 143* 95 161* 202* 158*     Scheduled Meds: . aspirin EC  81 mg Oral Daily  . atorvastatin  40 mg Oral q1800  . budesonide (PULMICORT) nebulizer solution  0.5 mg Nebulization BID  . diltiazem  120 mg Oral Daily  . furosemide  20 mg Oral Daily  . hydrALAZINE  10 mg Oral Q8H  . insulin aspart  0-5 Units Subcutaneous QHS  . insulin aspart  0-9 Units Subcutaneous TID WC  . ipratropium-albuterol  3 mL Nebulization Q6H  . lisinopril  10 mg Oral Daily  . metoprolol tartrate  25 mg Oral BID  .  oseltamivir  75 mg Oral BID  . sodium chloride flush  3 mL Intravenous Q12H  . sodium chloride flush  3 mL Intravenous Q12H   Continuous Infusions: . heparin 900 Units/hr (05/06/16 0740)    Assessment/Plan:  1. Acute respiratory failure with hypoxia. Patient required BiPAP initially on presentation to the ER. Resolved, now on room air. 2. Acute systolic congestive heart failure. Continue metoprolol, lisinopril, hydralazine and oral lasix. 3. Acute myocardial infarction. Continue aspirin, plavix, Heparin drip, Metoprolol and Lipitor. Paging cardiology to put on cardiac cath schedule for today 4. Influenza B positive. On Tamiflu.  Stopped steroids since the lungs are clear 5. Atrial fibrillation with rapid ventricular response. Patient rate controlled today with metoprolol and Cardizem CD.  6. Essential hypertension. Blood pressure is better.  Code Status:     Code Status Orders        Start     Ordered   05/01/16 0932  Full code  Continuous     05/01/16 0931    Code Status History    Date Active Date Inactive Code Status Order ID Comments User Context   This patient has a current code status but no historical code status.     Disposition Plan: Depending on results of cardiac catheterization  Consultants:  Cardiology  Antibiotics:  Tamiflu  Time spent: 22 Minutes, now  [place], [place]

## 2016-05-06 NOTE — Plan of Care (Signed)
Problem: Safety: Goal: Ability to remain free from injury will improve Outcome: Progressing Fall precautions in place, skid socks  Problem: Pain Managment: Goal: General experience of comfort will improve Outcome: Progressing Prn medications  Problem: Physical Regulation: Goal: Will remain free from infection Outcome: Not Progressing + flu, on Tamiflu  Problem: Tissue Perfusion: Goal: Risk factors for ineffective tissue perfusion will decrease Outcome: Progressing Heparin gtt

## 2016-05-07 ENCOUNTER — Encounter: Payer: Self-pay | Admitting: Cardiovascular Disease

## 2016-05-07 LAB — BASIC METABOLIC PANEL
Anion gap: 6 (ref 5–15)
BUN: 28 mg/dL — ABNORMAL HIGH (ref 6–20)
CALCIUM: 8 mg/dL — AB (ref 8.9–10.3)
CO2: 28 mmol/L (ref 22–32)
CREATININE: 0.73 mg/dL (ref 0.44–1.00)
Chloride: 103 mmol/L (ref 101–111)
GFR calc non Af Amer: >60 mL/min (ref >60)
Glucose, Bld: 90 mg/dL (ref 65–99)
Potassium: 4.2 mmol/L (ref 3.5–5.1)
SODIUM: 137 mmol/L (ref 135–145)

## 2016-05-07 LAB — GLUCOSE, CAPILLARY
Glucose-Capillary: 87 mg/dL (ref 65–99)
Glucose-Capillary: 107 mg/dL — ABNORMAL HIGH (ref 65–99)

## 2016-05-07 MED ORDER — LISINOPRIL 10 MG PO TABS: 10.0000 mg | ORAL_TABLET | Freq: Every day | ORAL | 0 refills | Status: DC

## 2016-05-07 MED ORDER — METOPROLOL TARTRATE 25 MG PO TABS: 25.0000 mg | ORAL_TABLET | Freq: Two times a day (BID) | ORAL | 0 refills | Status: DC

## 2016-05-07 MED ORDER — FUROSEMIDE 20 MG PO TABS: 20.0000 mg | ORAL_TABLET | Freq: Every day | ORAL | 0 refills | Status: DC

## 2016-05-07 MED ORDER — HYDRALAZINE HCL 10 MG PO TABS: 10.0000 mg | ORAL_TABLET | Freq: Two times a day (BID) | ORAL | 0 refills | Status: DC

## 2016-05-07 MED ORDER — SPIRONOLACTONE 25 MG PO TABS: 25.0000 mg | ORAL_TABLET | Freq: Every day | ORAL | 0 refills | Status: DC

## 2016-05-07 MED ORDER — CARVEDILOL 25 MG PO TABS
25.0000 mg | ORAL_TABLET | Freq: Two times a day (BID) | ORAL | Status: DC
  Administered 2016-05-07: 25 mg via ORAL
  Filled 2016-05-07: qty 1

## 2016-05-07 MED ORDER — DILTIAZEM HCL ER COATED BEADS 120 MG PO CP24: 120.0000 mg | ORAL_CAPSULE | Freq: Every day | ORAL | 0 refills | Status: DC

## 2016-05-07 MED ORDER — SACUBITRIL-VALSARTAN 24-26 MG PO TABS
1.0000 | ORAL_TABLET | Freq: Two times a day (BID) | ORAL | Status: DC
  Administered 2016-05-07: 1 via ORAL
  Filled 2016-05-07: qty 1

## 2016-05-07 MED ORDER — ATORVASTATIN CALCIUM 40 MG PO TABS: 40.0000 mg | ORAL_TABLET | Freq: Every day | ORAL | 0 refills | Status: DC

## 2016-05-07 MED ORDER — APIXABAN 5 MG PO TABS: 5.0000 mg | ORAL_TABLET | Freq: Two times a day (BID) | ORAL | 0 refills | Status: DC

## 2016-05-07 MED ORDER — SPIRONOLACTONE 25 MG PO TABS
25.0000 mg | ORAL_TABLET | Freq: Every day | ORAL | Status: DC
  Administered 2016-05-07: 25 mg via ORAL
  Filled 2016-05-07: qty 1

## 2016-05-07 MED ORDER — ALBUTEROL SULFATE HFA 108 (90 BASE) MCG/ACT IN AERS: 2.0000 | INHALATION_SPRAY | Freq: Four times a day (QID) | RESPIRATORY_TRACT | 0 refills | Status: DC | PRN

## 2016-05-07 NOTE — Progress Notes (Signed)
SUBJECTIVE: Patient is feeling much better no chest pain or shortness of breath   Vitals:   05/06/16 1648 05/06/16 1934 05/06/16 1957 05/07/16 0537  BP: (!) 141/90 (!) 141/94  (!) 142/78  Pulse: 74 (!) 31  63  Resp: 18 16  18   Temp: 97.6 F (36.4 C) 98.3 F (36.8 C)  97.8 F (36.6 C)  TempSrc: Oral Oral  Oral  SpO2: 93% 97% 96% 97%  Weight:    206 lb 6.4 oz (93.6 kg)  Height:        Intake/Output Summary (Last 24 hours) at 05/07/16 0846 Last data filed at 05/07/16 0143  Gross per 24 hour  Intake            380.7 ml  Output             1150 ml  Net           -769.3 ml    LABS: Basic Metabolic Panel:  Recent Labs  05/05/16 0050  05/06/16 1017 05/07/16 0545  NA 136  < > 137 137  K 4.0  < > 4.2 4.2  CL 102  < > 103 103  CO2 29  < > 26 28  GLUCOSE 133*  < > 93 90  BUN 33*  < > 35* 28*  CREATININE 0.82  < > 0.76 0.73  CALCIUM 8.0*  < > 8.4* 8.0*  MG 2.1  --   --   --   < > = values in this interval not displayed. Liver Function Tests: No results for input(s): AST, ALT, ALKPHOS, BILITOT, PROT, ALBUMIN in the last 72 hours. No results for input(s): LIPASE, AMYLASE in the last 72 hours. CBC:  Recent Labs  05/05/16 0050 05/06/16 1017  WBC 4.4 7.2  HGB 12.4 12.9  HCT 37.3 38.3  MCV 86.6 85.6  PLT 180 197   Cardiac Enzymes: No results for input(s): CKTOTAL, CKMB, CKMBINDEX, TROPONINI in the last 72 hours. BNP: Invalid input(s): POCBNP D-Dimer: No results for input(s): DDIMER in the last 72 hours. Hemoglobin A1C: No results for input(s): HGBA1C in the last 72 hours. Fasting Lipid Panel: No results for input(s): CHOL, HDL, LDLCALC, TRIG, CHOLHDL, LDLDIRECT in the last 72 hours. Thyroid Function Tests: No results for input(s): TSH, T4TOTAL, T3FREE, THYROIDAB in the last 72 hours.  Invalid input(s): FREET3 Anemia Panel: No results for input(s): VITAMINB12, FOLATE, FERRITIN, TIBC, IRON, RETICCTPCT in the last 72 hours.   PHYSICAL EXAM General: Well  developed, well nourished, in no acute distress HEENT:  Normocephalic and atramatic Neck:  No JVD.  Lungs: Clear bilaterally to auscultation and percussion. Heart: HRRR . Normal S1 and S2 without gallops or murmurs.  Abdomen: Bowel sounds are positive, abdomen soft and non-tender  Msk:  Back normal, normal gait. Normal strength and tone for age. Extremities: No clubbing, cyanosis or edema.   Neuro: Alert and oriented X 3. Psych:  Good affect, responds appropriately  TELEMETRY: Sinus rhythm  ASSESSMENT AND PLAN: Cardiac catheterization revealed no obstructive disease but left ventricle ejection fraction was 40%. We will DC Plavix and continue atelectasis and aspirin and will add enterasto. We will follow-up in the office after discharge today on Thursday at 10 AM.  Active Problems:   Acute respiratory failure with hypoxia (HCC)   Acute CHF (congestive heart failure) ([place])    [name], MD, [facility] 05/07/2016 8:46 AM

## 2016-05-07 NOTE — Plan of Care (Signed)
Problem: Health Behavior/Discharge Planning: Goal: Ability to manage health-related needs will improve Outcome: Completed/Met Date Met: 05/07/16 Discharge education completed

## 2016-05-07 NOTE — Care Management (Signed)
It is documented by cardiology that patient to discharge on Entresto and patient is to go to cardiology office at 10 this morning.  There is no script for Entresto and is not in the list of scripts that was escribed to [facility].  Provided patient with 30 day coupon in the event she does require this medication.

## 2016-05-07 NOTE — Discharge Summary (Signed)
[place] [facility] [place] NAME: Mackenzie [name]    MR#:  [id number]  DATE OF BIRTH:  1951/01/25  DATE OF ADMISSION:  05/01/2016 ADMITTING PHYSICIAN: Demetrios Loll, MD  DATE OF DISCHARGE: 05/07/2016  3:25 PM  PRIMARY CARE PHYSICIAN: [facility]   ADMISSION DIAGNOSIS:  Left bundle branch block [I44.7] Respiratory distress [R06.00] Hypertensive emergency [I16.1] Acute CHF (congestive heart failure) ([place]) [I50.9]  DISCHARGE DIAGNOSIS:  Active Problems:   Acute respiratory failure with hypoxia (HCC)   Acute CHF (congestive heart failure) ([place])   SECONDARY DIAGNOSIS:   Past Medical History:  Diagnosis Date  . Arthritis   . Hypertension     HOSPITAL COURSE:   1.  Acute respiratory failure with hypoxia. The patient initially required BiPAP on presentation to the ER. This has resolved with diuresis and treatment of the flu. This is now resolved and she is breathing comfortably on room air. 2. Influenza B positive with wheeze and bronchospasm. The patient was started on Tamiflu and finish the course here in the hospital. I had to give IV Solu-Medrol and nebulizer treatments. Upon discharge her lungs are clear. 3. Acute systolic congestive heart failure with cardiomyopathy. The patient was started on metoprolol, lisinopril and diuresed with IV Lasix. The patient was switched over to low-dose oral Lasix upon discharge. Aldactone was added low dose. Since the patient received ACE inhibitor here in the hospital, we are unable to give entresto at this time secondary to needing a 3 day washout of ACE inhibitor prior to starting this medication. I would rather have the patient go home on the ACE inhibitor at this point. 4. Acute myocardial infarction. The patient was placed on aspirin and Plavix and heparin drip and a prolonged and Lipitor. Since the patient's cardiac catheterization showed nonobstructive coronary artery disease, she would be  treated medically. We stopped the Plavix and heparin drip. Upon discharge she'll be on aspirin and Eliquis for anticoagulation 5. Essential hypertension. Blood pressure was accelerated hypertension on presentation now better with dietary discussions and blood pressure medications 6. Atrial fibrillation with rapid ventricular response. The patient is on low-dose metoprolol and Cardizem CD was added. Patient is now rate controlled. Can consider stopping the Cardizem CD as outpatient and titrating up the metoprolol as long as her lungs do not have bronchospasm.  I believe a lot of her issues were secondary to the influenza and bronchospasm that she had during the hospital course. Careful with diuresis as outpatient. Recommend checking a BMP as outpatient.  DISCHARGE CONDITIONS:   Satisfactory  CONSULTS OBTAINED:  Treatment Team:  [name], MD  DRUG ALLERGIES:  No Known Allergies  DISCHARGE MEDICATIONS:   Discharge Medication List as of 05/07/2016 12:16 PM    START taking these medications   Details  albuterol (PROVENTIL HFA;VENTOLIN HFA) 108 (90 Base) MCG/ACT inhaler Inhale 2 puffs into the lungs every 6 (six) hours as needed for wheezing or shortness of breath., Starting Tue 05/07/2016, Print    apixaban (ELIQUIS) 5 MG TABS tablet Take 1 tablet (5 mg total) by mouth 2 (two) times daily., Starting Tue 05/07/2016, Print    atorvastatin (LIPITOR) 40 MG tablet Take 1 tablet (40 mg total) by mouth daily at 6 PM., Starting Tue 05/07/2016, Print    diltiazem (CARDIZEM CD) 120 MG 24 hr capsule Take 1 capsule (120 mg total) by mouth daily., Starting Tue 05/07/2016, Print    lisinopril (PRINIVIL,ZESTRIL) 10 MG tablet Take 1 tablet (10  mg total) by mouth daily., Starting Tue 05/07/2016, Print    metoprolol tartrate (LOPRESSOR) 25 MG tablet Take 1 tablet (25 mg total) by mouth 2 (two) times daily., Starting Tue 05/07/2016, Print    spironolactone (ALDACTONE) 25 MG tablet Take 1 tablet (25 mg  total) by mouth daily., Starting Wed 05/08/2016, Print      CONTINUE these medications which have CHANGED   Details  furosemide (LASIX) 20 MG tablet Take 1 tablet (20 mg total) by mouth daily., Starting Tue 05/07/2016, Print      CONTINUE these medications which have NOT CHANGED   Details  aspirin EC 81 MG tablet Take 81 mg by mouth daily., Historical Med      STOP taking these medications     olmesartan-hydrochlorothiazide (BENICAR HCT) 40-25 MG tablet      hydrALAZINE (APRESOLINE) 10 MG tablet          DISCHARGE INSTRUCTIONS:   Follow-up with Dr. Ceasar Mons one week Follow-up PMD in 2 weeks  If you experience worsening of your admission symptoms, develop shortness of breath, life threatening emergency, suicidal or homicidal thoughts you must seek medical attention immediately by calling 911 or calling your MD immediately  if symptoms less severe.  You Must read complete instructions/literature along with all the possible adverse reactions/side effects for all the Medicines you take and that have been prescribed to you. Take any new Medicines after you have completely understood and accept all the possible adverse reactions/side effects.   Please note  You were cared for by a hospitalist during your hospital stay. If you have any questions about your discharge medications or the care you received while you were in the hospital after you are discharged, you can call the unit and asked to speak with the hospitalist on call if the hospitalist that took care of you is not available. Once you are discharged, your primary care physician will handle any further medical issues. Please note that NO REFILLS for any discharge medications will be authorized once you are discharged, as it is imperative that you return to your primary care physician (or establish a relationship with a primary care physician if you do not have one) for your aftercare needs so that they can reassess your need for  medications and monitor your lab values.    Today   CHIEF COMPLAINT:   Chief Complaint  Patient presents with  . Respiratory Distress    HISTORY OF PRESENT ILLNESS:  Mackenzie [name]  is a 66 y.o. female presented with respiratory distress   VITAL SIGNS:  Blood pressure 125/66, pulse 63, temperature 97.9 F (36.6 C), temperature source Oral, resp. rate 18, height 5\' 6"  (1.676 m), weight 93.6 kg (206 lb 6.4 oz), SpO2 90 %.   PHYSICAL EXAMINATION:  GENERAL:  66 y.o.-year-old patient lying in the bed with no acute distress.  EYES: Pupils equal, round, reactive to light and accommodation. No scleral icterus. Extraocular muscles intact.  HEENT: Head atraumatic, normocephalic. Oropharynx and nasopharynx clear.  NECK:  Supple, no jugular venous distention. No thyroid enlargement, no tenderness.  LUNGS: Normal breath sounds bilaterally, no wheezing, rales,rhonchi or crepitation. No use of accessory muscles of respiration.  CARDIOVASCULAR: S1, S2 Irregularly irregular. 2/6 systolic murmurs. No rubs, or gallops.  ABDOMEN: Soft, non-tender, non-distended. Bowel sounds present. No organomegaly or mass.  EXTREMITIES: 2+ edema. No cyanosis, or clubbing.  NEUROLOGIC: Cranial nerves II through XII are intact. Muscle strength 5/5 in all extremities. Sensation intact. Gait not checked.  PSYCHIATRIC: The patient is alert and oriented x 3.  SKIN: No obvious rash, lesion, or ulcer.   DATA REVIEW:   CBC  Recent Labs Lab 05/06/16 1017  WBC 7.2  HGB 12.9  HCT 38.3  PLT 197    Chemistries   Recent Labs Lab 05/05/16 0050  05/07/16 0545  NA 136  < > 137  K 4.0  < > 4.2  CL 102  < > 103  CO2 29  < > 28  GLUCOSE 133*  < > 90  BUN 33*  < > 28*  CREATININE 0.82  < > 0.73  CALCIUM 8.0*  < > 8.0*  MG 2.1  --   --   < > = values in this interval not displayed.  Cardiac Enzymes  Recent Labs Lab 05/01/16 1621  TROPONINI 3.52*   Management plans discussed with the patient, And she is  in agreement. I spoke with the patient's daughter yesterday evening about the plan.  CODE STATUS:     Code Status Orders        Start     Ordered   05/01/16 0932  Full code  Continuous     05/01/16 0931    Code Status History    Date Active Date Inactive Code Status Order ID Comments User Context   This patient has a current code status but no historical code status.      TOTAL TIME TAKING CARE OF THIS PATIENT: 35 minutes.    Loletha Grayer M.D on 05/07/2016 at 4:44 PM  Between 7am to 6pm - Pager - [phone]  After 6pm go to [email or web address] - Proofreader  [facility] Office  [phone]  CC: Primary care physician; [facility] Cardiology Dr. Ceasar Mons

## 2016-05-13 ENCOUNTER — Encounter: Payer: Self-pay | Admitting: Family

## 2016-05-13 ENCOUNTER — Ambulatory Visit: Payer: Medicare HMO | Attending: Family | Admitting: Family

## 2016-05-13 DIAGNOSIS — Z7901 Long term (current) use of anticoagulants: Secondary | ICD-10-CM | POA: Insufficient documentation

## 2016-05-13 DIAGNOSIS — Z79899 Other long term (current) drug therapy: Secondary | ICD-10-CM | POA: Diagnosis not present

## 2016-05-13 DIAGNOSIS — I10 Essential (primary) hypertension: Secondary | ICD-10-CM

## 2016-05-13 DIAGNOSIS — R0683 Snoring: Secondary | ICD-10-CM | POA: Insufficient documentation

## 2016-05-13 DIAGNOSIS — I5022 Chronic systolic (congestive) heart failure: Secondary | ICD-10-CM | POA: Diagnosis not present

## 2016-05-13 DIAGNOSIS — I110 Hypertensive heart disease with heart failure: Secondary | ICD-10-CM | POA: Insufficient documentation

## 2016-05-13 DIAGNOSIS — M1990 Unspecified osteoarthritis, unspecified site: Secondary | ICD-10-CM | POA: Insufficient documentation

## 2016-05-13 DIAGNOSIS — Z7982 Long term (current) use of aspirin: Secondary | ICD-10-CM | POA: Insufficient documentation

## 2016-05-13 MED ORDER — SACUBITRIL-VALSARTAN 24-26 MG PO TABS: 1.0000 | ORAL_TABLET | Freq: Two times a day (BID) | ORAL | 3 refills | Status: DC

## 2016-05-13 NOTE — Progress Notes (Signed)
Patient ID: Mackenzie [name], female    DOB: 08-Feb-1951, 66 y.o.   MRN: [id number]  HPI  Ms Flott is a 66 y/o female with a history of HTN, arthritis, left BBB and chronic heart failure.   Last echo was done 05/01/16 and showed an EF of 35-40% along with mild MR. 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   Admitted 05/01/16 with acute respiratory failure with hypoxia and influenza. Initially needed bipap. Given IV steroids and nebulizer treatments along with IV diuretics. Cardiology consult was obtained. Medications were adjusted. Catheterization showed nonobstructive CAD so medications were to be optimized.   She presents today for her initial visit with fatigue and shortness of breath with moderate exertion. Improves quickly upon rest. Denies any swelling in her legs/abdomen. Family members present say that she does snore at night. Already weighing daily and says that she's lost about 10 pounds since discharge.   Past Medical History:  Diagnosis Date  . Arthritis   . Hypertension    Past Surgical History:  Procedure Laterality Date  . LEFT HEART CATH AND CORONARY ANGIOGRAPHY Right 05/06/2016   Procedure: Left Heart Cath and Coronary Angiography;  Surgeon: [name], MD;  Location: [place] [facility];  Service: Cardiovascular;  Laterality: Right;   Family History  Problem Relation Age of Onset  . Diabetes Mother   . Heart disease Mother   . Heart attack Father   . Diabetes Brother   . Breast cancer Maternal Aunt   . Breast cancer Maternal Grandmother    Social History  Substance Use Topics  . Smoking status: Never Smoker  . Smokeless tobacco: Never Used  . Alcohol use No   No Known Allergies   Prior to Admission medications   Medication Sig Start Date End Date Taking? Authorizing Provider  albuterol (PROVENTIL HFA;VENTOLIN HFA) 108 (90 Base) MCG/ACT inhaler Inhale 2 puffs into the lungs every 6 (six)  hours as needed for wheezing or shortness of breath. 05/07/16  Yes Loletha Grayer, MD  apixaban (ELIQUIS) 5 MG TABS tablet Take 1 tablet (5 mg total) by mouth 2 (two) times daily. 05/07/16  Yes Loletha Grayer, MD  aspirin EC 81 MG tablet Take 81 mg by mouth daily.   Yes Historical Provider, MD  atorvastatin (LIPITOR) 40 MG tablet Take 1 tablet (40 mg total) by mouth daily at 6 PM. 05/07/16  Yes Loletha Grayer, MD  diltiazem (CARDIZEM CD) 120 MG 24 hr capsule Take 1 capsule (120 mg total) by mouth daily. 05/07/16  Yes Loletha Grayer, MD  furosemide (LASIX) 20 MG tablet Take 1 tablet (20 mg total) by mouth daily. 05/07/16  Yes Richard Leslye Peer, MD  lisinopril (PRINIVIL,ZESTRIL) 10 MG tablet Take 1 tablet (10 mg total) by mouth daily. 05/07/16  Yes Loletha Grayer, MD  metoprolol tartrate (LOPRESSOR) 25 MG tablet Take 1 tablet (25 mg total) by mouth 2 (two) times daily. 05/07/16  Yes Loletha Grayer, MD  spironolactone (ALDACTONE) 25 MG tablet Take 1 tablet (25 mg total) by mouth daily. 05/08/16  Yes Loletha Grayer, MD    Review of Systems  Constitutional: Positive for fatigue. Negative for appetite change.  HENT: Negative for congestion, postnasal drip and sore throat.   Eyes: Negative.   Respiratory: Positive for cough (dry) and shortness of breath.   Cardiovascular: Negative for chest pain, palpitations and leg swelling.  Gastrointestinal: Negative for abdominal distention and abdominal pain.  Endocrine: Negative.  Genitourinary: Negative.   Musculoskeletal: Negative for back pain and neck pain.  Skin: Negative.   Allergic/Immunologic: Negative.   Neurological: Positive for light-headedness. Negative for dizziness.  Hematological: Negative for adenopathy. Does not bruise/bleed easily.  Psychiatric/Behavioral: Negative for sleep disturbance (sleeping on 3 pillows).   Vitals:   05/13/16 0946  BP: (!) 161/83  Pulse: 70  Resp: 18  SpO2: 99%  Weight: 198 lb 8 oz (90 kg)  Height: 5\' 6"   (1.676 m)   Wt Readings from Last 3 Encounters:  05/13/16 198 lb 8 oz (90 kg)  05/07/16 206 lb 6.4 oz (93.6 kg)   Lab Results  Component Value Date   CREATININE 0.73 05/07/2016   CREATININE 0.76 05/06/2016   CREATININE 0.88 05/06/2016    Physical Exam  Constitutional: She is oriented to person, place, and time. She appears well-developed and well-nourished.  HENT:  Head: Normocephalic and atraumatic.  Eyes: Conjunctivae are normal. Pupils are equal, round, and reactive to light.  Neck: Normal range of motion. Neck supple. No JVD present.  Cardiovascular: Normal rate and regular rhythm.   Pulmonary/Chest: Effort normal. She has no wheezes. She has no rales.  Abdominal: Soft. She exhibits no distension. There is no tenderness.  Musculoskeletal: She exhibits no edema or tenderness.  Neurological: She is alert and oriented to person, place, and time.  Skin: Skin is warm and dry.  Psychiatric: She has a normal mood and affect. Her behavior is normal. Thought content normal.  Nursing note and vitals reviewed.   Assessment & Plan:  1: Chronic heart failure with reduced ejection fraction- - NYHA class II - euvolemic - already weighing daily. Instructed to call for an overnight weight gain of >2 pounds or a weekly weight gain of >5 pounds - not adding salt but occasionally adds salt to her cooking. Discussed the importance of not using any salt and to use Mrs. Dash for seasoning. Dietary information regarding a 2000mg  sodium diet was given to her along with a low sodium cookbook.  - discussed keeping fluid intake between 40-50 ounces daily -advised to not take anymore lisinopril. Take 1 dose of entresto tomorrow (Tuesday) evening and then begin it twice daily on Wed 05/15/16. Recent potassium and renal function were normal - will check a BMP at next visit - sees cardiologist Humphrey Rolls) tomorrow 05/14/16  2: HTN- - BP 142/80 on recheck - has an appointment with [facility] on 05/16/16  3: Snoring- - will make a referral to [facility] for a sleep study  Medication list was reviewed.  Return in 1 month or sooner for any questions/problems before then.

## 2016-05-13 NOTE — Patient Instructions (Addendum)
Continue weighing daily and call for an overnight weight gain of > 2 pounds or a weekly weight gain of >5 pounds.  Do not take anymore lisinopril. Take one dose of entresto tomorrow (Tuesday) night and on Wed, begin taking it twice daily.

## 2016-05-14 ENCOUNTER — Encounter: Payer: Self-pay | Admitting: Family

## 2016-05-14 DIAGNOSIS — R0683 Snoring: Secondary | ICD-10-CM | POA: Insufficient documentation

## 2016-05-14 DIAGNOSIS — I5022 Chronic systolic (congestive) heart failure: Secondary | ICD-10-CM | POA: Insufficient documentation

## 2016-05-14 DIAGNOSIS — I10 Essential (primary) hypertension: Secondary | ICD-10-CM | POA: Insufficient documentation

## 2016-06-11 ENCOUNTER — Encounter: Payer: Self-pay | Admitting: Family

## 2016-06-11 ENCOUNTER — Other Ambulatory Visit
Admission: RE | Admit: 2016-06-11 | Discharge: 2016-06-11 | Disposition: A | Discharge status: Home / Self Care | Payer: Medicare HMO | Source: Ambulatory Visit | Attending: Family | Admitting: Family

## 2016-06-11 ENCOUNTER — Ambulatory Visit: Payer: Medicare HMO | Attending: Family | Admitting: Family

## 2016-06-11 VITALS — BP 147/60 | HR 69 | Resp 20 | Ht 66.0 in | Wt 198.2 lb

## 2016-06-11 DIAGNOSIS — Z833 Family history of diabetes mellitus: Secondary | ICD-10-CM | POA: Insufficient documentation

## 2016-06-11 DIAGNOSIS — I5022 Chronic systolic (congestive) heart failure: Secondary | ICD-10-CM | POA: Diagnosis present

## 2016-06-11 DIAGNOSIS — Z8249 Family history of ischemic heart disease and other diseases of the circulatory system: Secondary | ICD-10-CM | POA: Insufficient documentation

## 2016-06-11 DIAGNOSIS — I110 Hypertensive heart disease with heart failure: Secondary | ICD-10-CM | POA: Diagnosis not present

## 2016-06-11 DIAGNOSIS — Z7982 Long term (current) use of aspirin: Secondary | ICD-10-CM | POA: Insufficient documentation

## 2016-06-11 DIAGNOSIS — Z79899 Other long term (current) drug therapy: Secondary | ICD-10-CM | POA: Insufficient documentation

## 2016-06-11 DIAGNOSIS — R0683 Snoring: Secondary | ICD-10-CM

## 2016-06-11 DIAGNOSIS — Z7901 Long term (current) use of anticoagulants: Secondary | ICD-10-CM | POA: Insufficient documentation

## 2016-06-11 DIAGNOSIS — M1990 Unspecified osteoarthritis, unspecified site: Secondary | ICD-10-CM | POA: Insufficient documentation

## 2016-06-11 DIAGNOSIS — I10 Essential (primary) hypertension: Secondary | ICD-10-CM | POA: Diagnosis present

## 2016-06-11 LAB — BASIC METABOLIC PANEL
ANION GAP: 10 (ref 5–15)
BUN: 22 mg/dL — AB (ref 6–20)
CALCIUM: 9.4 mg/dL (ref 8.9–10.3)
CO2: 28 mmol/L (ref 22–32)
Chloride: 104 mmol/L (ref 101–111)
Creatinine, Ser: 0.99 mg/dL (ref 0.44–1.00)
GFR calc Af Amer: >60 mL/min (ref >60)
GFR, EST NON AFRICAN AMERICAN: 59 mL/min — AB (ref >60)
Glucose, Bld: 90 mg/dL (ref 65–99)
POTASSIUM: 4.3 mmol/L (ref 3.5–5.1)
SODIUM: 142 mmol/L (ref 135–145)

## 2016-06-11 MED ORDER — APIXABAN 5 MG PO TABS: 5.0000 mg | ORAL_TABLET | Freq: Two times a day (BID) | ORAL | 5 refills | Status: DC

## 2016-06-11 MED ORDER — SPIRONOLACTONE 25 MG PO TABS: 25.0000 mg | ORAL_TABLET | Freq: Every day | ORAL | 5 refills | Status: DC

## 2016-06-11 MED ORDER — CARVEDILOL 12.5 MG PO TABS: 12.5000 mg | ORAL_TABLET | Freq: Two times a day (BID) | ORAL | 5 refills | Status: DC

## 2016-06-11 MED ORDER — ATORVASTATIN CALCIUM 40 MG PO TABS: 40.0000 mg | ORAL_TABLET | Freq: Every day | ORAL | 5 refills | Status: AC

## 2016-06-11 MED ORDER — ALBUTEROL SULFATE HFA 108 (90 BASE) MCG/ACT IN AERS: 2.0000 | INHALATION_SPRAY | Freq: Every day | RESPIRATORY_TRACT | 5 refills | Status: AC | PRN

## 2016-06-11 MED ORDER — FUROSEMIDE 20 MG PO TABS: 20.0000 mg | ORAL_TABLET | Freq: Every day | ORAL | 5 refills | Status: DC

## 2016-06-11 NOTE — Progress Notes (Signed)
Patient ID: Mackenzie [name], female    DOB: 02-21-1950, 66 y.o.   MRN: [id number]  HPI  Mackenzie [name] is a 66 y/o female with a history of HTN, arthritis, left BBB and chronic heart failure.   Last echo was done 05/01/16 and showed an EF of 35-40% along with mild MR. 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   Admitted 05/01/16 with acute respiratory failure with hypoxia and influenza. Initially needed bipap. Given IV steroids and nebulizer treatments along with IV diuretics. Cardiology consult was obtained. Medications were adjusted. Catheterization showed nonobstructive CAD so medications were to be optimized.   She presents today for her follow-up visit with a chief complaint of mild shortness of breath upon moderate exertion such as walking long distances. She describes this as chronic in nature and occurring for the last several months. Is improved quickly upon rest. Has associated symptoms of cough and light-headedness.   Past Medical History:  Diagnosis Date  . Arthritis   . CHF (congestive heart failure) ([place])   . Hypertension    Past Surgical History:  Procedure Laterality Date  . LEFT HEART CATH AND CORONARY ANGIOGRAPHY Right 05/06/2016   Procedure: Left Heart Cath and Coronary Angiography;  Surgeon: [name], MD;  Location: [facility];  Service: Cardiovascular;  Laterality: Right;   Family History  Problem Relation Age of Onset  . Diabetes Mother   . Heart disease Mother   . Heart attack Father   . Diabetes Brother   . Breast cancer Maternal Aunt   . Breast cancer Maternal Grandmother    Social History  Substance Use Topics  . Smoking status: Never Smoker  . Smokeless tobacco: Never Used  . Alcohol use No   No Known Allergies  Prior to Admission medications   Medication Sig Start Date End Date Taking? Authorizing Provider  albuterol (PROVENTIL HFA;VENTOLIN HFA) 108 (90 Base) MCG/ACT  inhaler Inhale 2 puffs into the lungs every 6 (six) hours as needed for wheezing or shortness of breath. 05/07/16  Yes Loletha Grayer, MD  apixaban (ELIQUIS) 5 MG TABS tablet Take 1 tablet (5 mg total) by mouth 2 (two) times daily. 05/07/16  Yes Loletha Grayer, MD  aspirin EC 81 MG tablet Take 81 mg by mouth daily.   Yes Historical Provider, MD  atorvastatin (LIPITOR) 40 MG tablet Take 1 tablet (40 mg total) by mouth daily at 6 PM. 05/07/16  Yes Loletha Grayer, MD  carvedilol (COREG) 12.5 MG tablet Take 12.5 mg by mouth 2 (two) times daily with a meal.   Yes Historical Provider, MD  furosemide (LASIX) 20 MG tablet Take 1 tablet (20 mg total) by mouth daily. 05/07/16  Yes Loletha Grayer, MD  metoprolol tartrate (LOPRESSOR) 25 MG tablet Take 1 tablet (25 mg total) by mouth 2 (two) times daily. Patient taking differently: Take 25 mg by mouth daily.  05/07/16  Yes Richard Leslye Peer, MD  sacubitril-valsartan (ENTRESTO) 24-26 MG Take 1 tablet by mouth 2 (two) times daily. 05/13/16  Yes Alisa Graff, FNP  spironolactone (ALDACTONE) 25 MG tablet Take 1 tablet (25 mg total) by mouth daily. 05/08/16  Yes Loletha Grayer, MD    Review of Systems  Constitutional: Negative for appetite change and fatigue.  HENT: Negative for congestion, postnasal drip and sore throat.   Eyes: Negative.   Respiratory: Positive for cough (dry) and shortness of breath (when walking long distances). Negative for chest tightness.  Cardiovascular: Negative for chest pain, palpitations and leg swelling.  Gastrointestinal: Negative for abdominal distention and abdominal pain.  Endocrine: Negative.   Genitourinary: Negative.   Musculoskeletal: Negative for back pain and neck pain.  Skin: Negative.   Allergic/Immunologic: Negative.   Neurological: Positive for light-headedness. Negative for dizziness.  Hematological: Negative for adenopathy. Does not bruise/bleed easily.  Psychiatric/Behavioral: Negative for dysphoric mood and  sleep disturbance (sleeping on 3 pillows). The patient is not nervous/anxious.    Vitals:   06/11/16 0922  BP: (!) 147/60  Pulse: 69  Resp: 20  SpO2: 99%  Weight: 198 lb 4 oz (89.9 kg)  Height: 5\' 6"  (1.676 m)   Wt Readings from Last 3 Encounters:  06/11/16 198 lb 4 oz (89.9 kg)  05/13/16 198 lb 8 oz (90 kg)  05/07/16 206 lb 6.4 oz (93.6 kg)   Lab Results  Component Value Date   CREATININE 0.73 05/07/2016   CREATININE 0.76 05/06/2016   CREATININE 0.88 05/06/2016    Physical Exam  Constitutional: She is oriented to person, place, and time. She appears well-developed and well-nourished.  HENT:  Head: Normocephalic and atraumatic.  Neck: Normal range of motion. Neck supple. No JVD present.  Cardiovascular: Normal rate and regular rhythm.   Pulmonary/Chest: Effort normal. She has no wheezes. She has no rales.  Abdominal: Soft. She exhibits no distension. There is no tenderness.  Musculoskeletal: She exhibits no edema or tenderness.  Neurological: She is alert and oriented to person, place, and time.  Skin: Skin is warm and dry.  Psychiatric: She has a normal mood and affect. Her behavior is normal. Thought content normal.  Nursing note and vitals reviewed.   Assessment & Plan:  1: Chronic heart failure with reduced ejection fraction- - NYHA class II - euvolemic - continues to weigh daily. Instructed to call for an overnight weight gain of >2 pounds or a weekly weight gain of >5 pounds - not adding salt but occasionally adds salt to her cooking. Discussed the importance of not using any salt and to use Mackenzie [name] for seasoning.  - discussed keeping fluid intake between 40-50 ounces daily - tolerating entresto without known side effect - BMP drawn today - saw cardiologist Mackenzie [name]) 05/14/16 and returns to him 06/20/16 - has been taking both carvedilol and metoprolol. Advised patient to stop the metoprolol and continue carvedilol 12.5mg  twice daily  2: HTN- - BP looks good  today - saw Mackenzie [name] on 05/16/16  3: Snoring- - sleep study scheduled on 06/25/16  Medication bottles were reviewed.   Return in 2 months or sooner for any questions/problems before then.

## 2016-06-11 NOTE — Patient Instructions (Signed)
Continue weighing daily and call for an overnight weight gain of > 2 pounds or a weekly weight gain of >5 pounds.  Stop metoprolol since you are taking carvedilol.

## 2016-08-14 ENCOUNTER — Ambulatory Visit: Payer: Medicare HMO | Admitting: Family

## 2016-08-14 ENCOUNTER — Telehealth: Payer: Self-pay | Admitting: Family

## 2016-08-14 NOTE — Telephone Encounter (Signed)
Patient did not show for her [facility] appointment on 08/14/16. Will attempt to reschedule.

## 2016-08-29 ENCOUNTER — Emergency Department
Admission: EM | Admit: 2016-08-29 | Discharge: 2016-08-29 | Disposition: A | Discharge status: Home / Self Care | Payer: Medicare HMO | Attending: Student in an Organized Health Care Education/Training Program | Admitting: Student in an Organized Health Care Education/Training Program

## 2016-08-29 ENCOUNTER — Emergency Department: Payer: Medicare HMO

## 2016-08-29 ENCOUNTER — Encounter: Payer: Self-pay | Admitting: Emergency Medicine

## 2016-08-29 DIAGNOSIS — Z7982 Long term (current) use of aspirin: Secondary | ICD-10-CM | POA: Insufficient documentation

## 2016-08-29 DIAGNOSIS — M1990 Unspecified osteoarthritis, unspecified site: Secondary | ICD-10-CM | POA: Diagnosis not present

## 2016-08-29 DIAGNOSIS — M25552 Pain in left hip: Secondary | ICD-10-CM

## 2016-08-29 DIAGNOSIS — I509 Heart failure, unspecified: Secondary | ICD-10-CM | POA: Diagnosis not present

## 2016-08-29 DIAGNOSIS — I5022 Chronic systolic (congestive) heart failure: Secondary | ICD-10-CM | POA: Insufficient documentation

## 2016-08-29 DIAGNOSIS — Z7901 Long term (current) use of anticoagulants: Secondary | ICD-10-CM | POA: Insufficient documentation

## 2016-08-29 DIAGNOSIS — M79605 Pain in left leg: Secondary | ICD-10-CM

## 2016-08-29 DIAGNOSIS — I110 Hypertensive heart disease with heart failure: Secondary | ICD-10-CM | POA: Insufficient documentation

## 2016-08-29 DIAGNOSIS — I10 Essential (primary) hypertension: Secondary | ICD-10-CM | POA: Diagnosis not present

## 2016-08-29 DIAGNOSIS — Z7951 Long term (current) use of inhaled steroids: Secondary | ICD-10-CM | POA: Diagnosis not present

## 2016-08-29 LAB — CBC
HCT: 38.8 % (ref 35.0–47.0)
Hemoglobin: 13.3 g/dL (ref 12.0–16.0)
MCH: 29.7 pg (ref 26.0–34.0)
MCHC: 34.4 g/dL (ref 32.0–36.0)
MCV: 86.5 fL (ref 80.0–100.0)
Platelets: 214 10*3/uL (ref 150–440)
RBC: 4.49 MIL/uL (ref 3.80–5.20)
RDW: 15.6 % — AB (ref 11.5–14.5)
WBC: 6.9 10*3/uL (ref 3.6–11.0)

## 2016-08-29 LAB — BASIC METABOLIC PANEL
Anion gap: 9 (ref 5–15)
BUN: 22 mg/dL — AB (ref 6–20)
CALCIUM: 9.2 mg/dL (ref 8.9–10.3)
CO2: 25 mmol/L (ref 22–32)
CREATININE: 0.86 mg/dL (ref 0.44–1.00)
Chloride: 105 mmol/L (ref 101–111)
GFR calc Af Amer: >60 mL/min (ref >60)
Glucose, Bld: 122 mg/dL — ABNORMAL HIGH (ref 65–99)
POTASSIUM: 3.7 mmol/L (ref 3.5–5.1)
SODIUM: 139 mmol/L (ref 135–145)

## 2016-08-29 MED ORDER — DICLOFENAC SODIUM 1 % TD GEL: 2.0000 g | Freq: Three times a day (TID) | TRANSDERMAL | 0 refills | Status: DC | PRN

## 2016-08-29 MED ORDER — GABAPENTIN 100 MG PO CAPS: 100.0000 mg | ORAL_CAPSULE | Freq: Two times a day (BID) | ORAL | 0 refills | Status: DC

## 2016-08-29 NOTE — ED Triage Notes (Addendum)
Pt reports left leg pain that starts at the top of her leg and radiates down on the top of her leg for approximately three weeks. Pt describes the pain as burning. No obvious swelling noted. Pedal pulses very faint to palpation and difficult to locate. Denies injury. Pedal pulses audible with handheld doppler.

## 2016-08-29 NOTE — ED Notes (Signed)
Pt alert and oriented X4, active, cooperative, pt in NAD. RR even and unlabored, color WNL.  Pt informed to return if any life threatening symptoms occur.   

## 2016-08-29 NOTE — ED Provider Notes (Signed)
[facility] Emergency Department Provider Note    First MD Initiated Contact with Patient 08/29/16 1414     (approximate)  I have reviewed the triage vital signs and the nursing notes.   HISTORY  Chief Complaint Leg Pain    HPI Mackenzie [name] is a 66 y.o. female presents to the ER today from home with chief complaint of 4 weeks of cramping and burning sensation over the left anterior aspect of her thigh starting from her left hip. Patient states that she did have a fall 4 months ago. No fevers. No numbness or tingling. Patient able to ambulate. She is on eliquis. Does have a history of heart failure and hypertension. Has not noted any worsening swelling. No rashes. No dysuria. No change in bowel movements.   Past Medical History:  Diagnosis Date  . Arthritis   . CHF (congestive heart failure) ([place])   . Hypertension    Family History  Problem Relation Age of Onset  . Diabetes Mother   . Heart disease Mother   . Heart attack Father   . Diabetes Brother   . Breast cancer Maternal Aunt   . Breast cancer Maternal Grandmother    Past Surgical History:  Procedure Laterality Date  . LEFT HEART CATH AND CORONARY ANGIOGRAPHY Right 05/06/2016   Procedure: Left Heart Cath and Coronary Angiography;  Surgeon: [name], MD;  Location: [facility];  Service: Cardiovascular;  Laterality: Right;   Patient Active Problem List   Diagnosis Date Noted  . Chronic systolic heart failure ([address]) 05/14/2016  . HTN (hypertension) 05/14/2016  . Snoring 05/14/2016      Prior to Admission medications   Medication Sig Start Date End Date Taking? Authorizing Provider  albuterol (PROVENTIL HFA;VENTOLIN HFA) 108 (90 Base) MCG/ACT inhaler Inhale 2 puffs into the lungs daily as needed for wheezing or shortness of breath. 06/11/16   Alisa Graff, FNP  apixaban (ELIQUIS) 5 MG TABS tablet Take 1 tablet (5 mg total) by mouth 2 (two) times daily. 06/11/16   Alisa Graff, FNP  aspirin EC 81 MG tablet Take 81 mg by mouth daily.    [provider]  atorvastatin (LIPITOR) 40 MG tablet Take 1 tablet (40 mg total) by mouth daily at 6 PM. 06/11/16   Alisa Graff, FNP  carvedilol (COREG) 12.5 MG tablet Take 1 tablet (12.5 mg total) by mouth 2 (two) times daily with a meal. 06/11/16   Darylene Price A, FNP  diclofenac sodium (VOLTAREN) 1 % GEL Apply 2 g topically 3 (three) times daily as needed (pain). 08/29/16   Merlyn Lot, MD  furosemide (LASIX) 20 MG tablet Take 1 tablet (20 mg total) by mouth daily. 06/11/16   Alisa Graff, FNP  gabapentin (NEURONTIN) 100 MG capsule Take 1 capsule (100 mg total) by mouth 2 (two) times daily. 08/29/16 09/10/16  Merlyn Lot, MD  sacubitril-valsartan (ENTRESTO) 24-26 MG Take 1 tablet by mouth 2 (two) times daily. 05/13/16   Alisa Graff, FNP  spironolactone (ALDACTONE) 25 MG tablet Take 1 tablet (25 mg total) by mouth daily. 06/11/16   Alisa Graff, FNP    Allergies Patient has no known allergies.    Social History Social History  Substance Use Topics  . Smoking status: Never Smoker  . Smokeless tobacco: Never Used  . Alcohol use No    Review of Systems Patient denies headaches, rhinorrhea, blurry vision, numbness, shortness of breath, chest pain, edema,  cough, abdominal pain, nausea, vomiting, diarrhea, dysuria, fevers, rashes or hallucinations unless otherwise stated above in HPI. ____________________________________________   PHYSICAL EXAM:  VITAL SIGNS: Vitals:   08/29/16 1131  BP: (!) 177/96  Pulse: 65  Resp: 18  Temp: 98.6 F (37 C)    Constitutional: Alert and oriented. Well appearing and in no acute distress. Eyes: Conjunctivae are normal.  Head: Atraumatic. Nose: No congestion/rhinnorhea. Mouth/Throat: Mucous membranes are moist.   Neck: No stridor. Painless ROM.  Cardiovascular: Normal rate, regular rhythm. Grossly normal heart sounds.  Good peripheral  circulation. Respiratory: Normal respiratory effort.  No retractions. Lungs CTAB. Gastrointestinal: Soft and nontender. No distention. No abdominal bruits. No CVA tenderness. Musculoskeletal: tenderness reproducible with palpation of left ASIS.  - straight leg raise.  DP and PT pulses are palpable in BLE.  Brisk cap refill. No lower extremity tenderness nor edema.  No joint effusions. Neurologic:  Normal speech and language. No gross focal neurologic deficits are appreciated. No facial droop.  LE DTRs are 2+ and equal bilaterally.  Down going babinski Skin:  Skin is warm, dry and intact. No rash noted. Psychiatric: Mood and affect are normal. Speech and behavior are normal.  ____________________________________________   LABS (all labs ordered are listed, but only abnormal results are displayed)  Results for orders placed or performed during the hospital encounter of 08/29/16 (from the past 24 hour(s))  CBC     Status: Abnormal   Collection Time: 08/29/16 11:46 AM  Result Value Ref Range   WBC 6.9 3.6 - 11.0 K/uL   RBC 4.49 3.80 - 5.20 MIL/uL   Hemoglobin 13.3 12.0 - 16.0 g/dL   HCT 38.8 35.0 - 47.0 %   MCV 86.5 80.0 - 100.0 fL   MCH 29.7 26.0 - 34.0 pg   MCHC 34.4 32.0 - 36.0 g/dL   RDW 15.6 (H) 11.5 - 14.5 %   Platelets 214 150 - 440 K/uL  Basic metabolic panel     Status: Abnormal   Collection Time: 08/29/16 11:46 AM  Result Value Ref Range   Sodium 139 135 - 145 mmol/L   Potassium 3.7 3.5 - 5.1 mmol/L   Chloride 105 101 - 111 mmol/L   CO2 25 22 - 32 mmol/L   Glucose, Bld 122 (H) 65 - 99 mg/dL   BUN 22 (H) 6 - 20 mg/dL   Creatinine, Ser 0.86 0.44 - 1.00 mg/dL   Calcium 9.2 8.9 - 10.3 mg/dL   GFR calc non Af Amer >60 >60 mL/min   GFR calc Af Amer >60 >60 mL/min   Anion gap 9 5 - 15   ____________________________________________  ____________________________________________  RADIOLOGY  I personally reviewed all radiographic images ordered to evaluate for the above  acute complaints and reviewed radiology reports and findings.  These findings were personally discussed with the patient.  Please see medical record for radiology report.  ____________________________________________   PROCEDURES  Procedure(s) performed:  Procedures    Critical Care performed: no ____________________________________________   INITIAL IMPRESSION / ASSESSMENT AND PLAN / ED COURSE  Pertinent labs & imaging results that were available during my care of the patient were reviewed by me and considered in my medical decision making (see chart for details).  DDX: meralgia paresthetic, radiculopathy, arthritis, claudication  Balinda P Battenfield is a 66 y.o. who presents to the ED with pain to left leg as described above. Patient well-appearing and in no acute distress. Patient able to ambulate into the room. Patient with physical exam findings  suggesting some component of nerve entrapment syndrome. Her abdominal exam is soft and benign.  Is not clinically consistent with acute limb ischemia. No evidence of DVT on ultrasound. X-ray will be ordered to evaluate for any underlying arthritis or degenerative disc disease.  Clinical Course as of Aug 30 1518  Thu Aug 29, 2016  1519 X-rays with evidence of arthritis and degenerative disc disease. No evidence of fracture. Patient able to ambulate with a steady gait. No evidence of critical nerve impingement. Will discharge patient with anti-inflammatories and a brief course of Neurontin to see if that improves her symptoms. She has follow-up with her PCP. Discussed signs and symptoms for which she should return to the ER.  Have discussed with the patient and available family all diagnostics and treatments performed thus far and all questions were answered to the best of my ability. The patient demonstrates understanding and agreement with plan.   [PR]    Clinical Course User Index [PR] Merlyn Lot, MD      ____________________________________________   FINAL CLINICAL IMPRESSION(S) / ED DIAGNOSES  Final diagnoses:  Left hip pain  Leg pain, anterior, left      NEW MEDICATIONS STARTED DURING THIS VISIT:  New Prescriptions   DICLOFENAC SODIUM (VOLTAREN) 1 % GEL    Apply 2 g topically 3 (three) times daily as needed (pain).   GABAPENTIN (NEURONTIN) 100 MG CAPSULE    Take 1 capsule (100 mg total) by mouth 2 (two) times daily.     Note:  This document was prepared using Dragon voice recognition software and may include unintentional dictation errors.    Merlyn Lot, MD 08/29/16 1520

## 2018-01-10 ENCOUNTER — Emergency Department
Admission: EM | Admit: 2018-01-10 | Discharge: 2018-01-10 | Disposition: A | Discharge status: Home / Self Care | Payer: Medicare HMO | Attending: Emergency Medicine | Admitting: Emergency Medicine

## 2018-01-10 ENCOUNTER — Other Ambulatory Visit: Payer: Self-pay

## 2018-01-10 ENCOUNTER — Emergency Department: Payer: Medicare HMO

## 2018-01-10 ENCOUNTER — Encounter: Payer: Self-pay | Admitting: Emergency Medicine

## 2018-01-10 DIAGNOSIS — Z7901 Long term (current) use of anticoagulants: Secondary | ICD-10-CM | POA: Insufficient documentation

## 2018-01-10 DIAGNOSIS — I5022 Chronic systolic (congestive) heart failure: Secondary | ICD-10-CM | POA: Insufficient documentation

## 2018-01-10 DIAGNOSIS — I110 Hypertensive heart disease with heart failure: Secondary | ICD-10-CM | POA: Insufficient documentation

## 2018-01-10 DIAGNOSIS — N950 Postmenopausal bleeding: Secondary | ICD-10-CM | POA: Diagnosis not present

## 2018-01-10 DIAGNOSIS — Z79899 Other long term (current) drug therapy: Secondary | ICD-10-CM | POA: Insufficient documentation

## 2018-01-10 DIAGNOSIS — Z7982 Long term (current) use of aspirin: Secondary | ICD-10-CM | POA: Diagnosis not present

## 2018-01-10 DIAGNOSIS — R935 Abnormal findings on diagnostic imaging of other abdominal regions, including retroperitoneum: Secondary | ICD-10-CM

## 2018-01-10 DIAGNOSIS — N939 Abnormal uterine and vaginal bleeding, unspecified: Secondary | ICD-10-CM | POA: Diagnosis present

## 2018-01-10 LAB — CBC WITH DIFFERENTIAL/PLATELET
ABS IMMATURE GRANULOCYTES: 0.03 10*3/uL (ref 0.00–0.07)
BASOS ABS: 0.1 10*3/uL (ref 0.0–0.1)
Basophils Relative: 1 %
Eosinophils Absolute: 0.1 10*3/uL (ref 0.0–0.5)
Eosinophils Relative: 1 %
HEMATOCRIT: 40 % (ref 36.0–46.0)
HEMOGLOBIN: 13.1 g/dL (ref 12.0–15.0)
IMMATURE GRANULOCYTES: 0 %
Lymphocytes Relative: 30 %
Lymphs Abs: 2.7 10*3/uL (ref 0.7–4.0)
MCH: 30.7 pg (ref 26.0–34.0)
MCHC: 32.8 g/dL (ref 30.0–36.0)
MCV: 93.7 fL (ref 80.0–100.0)
Monocytes Absolute: 0.7 10*3/uL (ref 0.1–1.0)
Monocytes Relative: 8 %
NEUTROS ABS: 5.6 10*3/uL (ref 1.7–7.7)
NEUTROS PCT: 60 %
NRBC: 0 % (ref 0.0–0.2)
Platelets: 228 10*3/uL (ref 150–400)
RBC: 4.27 MIL/uL (ref 3.87–5.11)
RDW: 13.4 % (ref 11.5–15.5)
WBC: 9.2 10*3/uL (ref 4.0–10.5)

## 2018-01-10 LAB — COMPREHENSIVE METABOLIC PANEL
ALBUMIN: 3.8 g/dL (ref 3.5–5.0)
ALK PHOS: 84 U/L (ref 38–126)
ALT: 40 U/L (ref 0–44)
AST: 21 U/L (ref 15–41)
Anion gap: 9 (ref 5–15)
BILIRUBIN TOTAL: 0.7 mg/dL (ref 0.3–1.2)
BUN: 19 mg/dL (ref 8–23)
CO2: 24 mmol/L (ref 22–32)
CREATININE: 0.71 mg/dL (ref 0.44–1.00)
Calcium: 8.6 mg/dL — ABNORMAL LOW (ref 8.9–10.3)
Chloride: 108 mmol/L (ref 98–111)
GFR calc Af Amer: >60 mL/min (ref >60)
GLUCOSE: 107 mg/dL — AB (ref 70–99)
POTASSIUM: 4.1 mmol/L (ref 3.5–5.1)
Sodium: 141 mmol/L (ref 135–145)
TOTAL PROTEIN: 7.6 g/dL (ref 6.5–8.1)

## 2018-01-10 LAB — WET PREP, GENITAL
Clue Cells Wet Prep HPF POC: NONE SEEN
Sperm: NONE SEEN
TRICH WET PREP: NONE SEEN
YEAST WET PREP: NONE SEEN

## 2018-01-10 LAB — URINALYSIS, COMPLETE (UACMP) WITH MICROSCOPIC
BACTERIA UA: NONE SEEN
BILIRUBIN URINE: NEGATIVE
Glucose, UA: NEGATIVE mg/dL
Ketones, ur: NEGATIVE mg/dL
Leukocytes, UA: NEGATIVE
NITRITE: NEGATIVE
PROTEIN: NEGATIVE mg/dL
RBC / HPF: >50 RBC/hpf — ABNORMAL HIGH (ref 0–5)
SPECIFIC GRAVITY, URINE: 1.014 (ref 1.005–1.030)
pH: 6 (ref 5.0–8.0)

## 2018-01-10 LAB — PROTIME-INR
INR: 1.22
PROTHROMBIN TIME: 15.3 s — AB (ref 11.4–15.2)

## 2018-01-10 LAB — APTT: aPTT: 40 seconds — ABNORMAL HIGH (ref 24–36)

## 2018-01-10 NOTE — ED Triage Notes (Signed)
Pt to ed with c/o vaginal bleeding since Wednesday.  Pt denies abd pain.

## 2018-01-10 NOTE — ED Notes (Signed)
Patient transported to Ultrasound 

## 2018-01-10 NOTE — ED Provider Notes (Signed)
[facility] Emergency Department Provider Note  ____________________________________________  Time seen: Approximately 8:24 PM  I have reviewed the triage vital signs and the nursing notes.   HISTORY  Chief Complaint Vaginal Bleeding   HPI Mackenzie [name] is a 67 y.o. female with a history of hypertension, CAD on Eliquis, CHF, arthritis who presents for evaluation of vaginal bleeding.  Patient reports 3 days of constant vaginal spotting.  She denies severe bleeding or passing large clots.  She has been postmenopausal for almost 2 decades.  She denies abdominal pain, vaginal trauma, melena, hematochezia, hematuria or dysuria.  No discharge.  No dizziness, chest pain or shortness of breath. Patient is sexually active, no prior STD.   Past Medical History:  Diagnosis Date  . Arthritis   . CHF (congestive heart failure) ([place])   . Hypertension     Patient Active Problem List   Diagnosis Date Noted  . Chronic systolic heart failure ([place]) 05/14/2016  . HTN (hypertension) 05/14/2016  . Snoring 05/14/2016    Past Surgical History:  Procedure Laterality Date  . LEFT HEART CATH AND CORONARY ANGIOGRAPHY Right 05/06/2016   Procedure: Left Heart Cath and Coronary Angiography;  Surgeon: [name], MD;  Location: [place] [facility];  Service: Cardiovascular;  Laterality: Right;    Prior to Admission medications   Medication Sig Start Date End Date Taking? Authorizing Provider  albuterol (PROVENTIL HFA;VENTOLIN HFA) 108 (90 Base) MCG/ACT inhaler Inhale 2 puffs into the lungs daily as needed for wheezing or shortness of breath. 06/11/16   Alisa Graff, FNP  apixaban (ELIQUIS) 5 MG TABS tablet Take 1 tablet (5 mg total) by mouth 2 (two) times daily. 06/11/16   Alisa Graff, FNP  aspirin EC 81 MG tablet Take 81 mg by mouth daily.    [provider]  atorvastatin (LIPITOR) 40 MG tablet Take 1 tablet (40 mg total) by mouth daily at 6 PM. 06/11/16    Alisa Graff, FNP  carvedilol (COREG) 12.5 MG tablet Take 1 tablet (12.5 mg total) by mouth 2 (two) times daily with a meal. 06/11/16   Darylene Price A, FNP  diclofenac sodium (VOLTAREN) 1 % GEL Apply 2 g topically 3 (three) times daily as needed (pain). 08/29/16   Merlyn Lot, MD  furosemide (LASIX) 20 MG tablet Take 1 tablet (20 mg total) by mouth daily. 06/11/16   Alisa Graff, FNP  gabapentin (NEURONTIN) 100 MG capsule Take 1 capsule (100 mg total) by mouth 2 (two) times daily. 08/29/16 09/10/16  Merlyn Lot, MD  sacubitril-valsartan (ENTRESTO) 24-26 MG Take 1 tablet by mouth 2 (two) times daily. 05/13/16   Alisa Graff, FNP  spironolactone (ALDACTONE) 25 MG tablet Take 1 tablet (25 mg total) by mouth daily. 06/11/16   Alisa Graff, FNP    Allergies Patient has no known allergies.  Family History  Problem Relation Age of Onset  . Diabetes Mother   . Heart disease Mother   . Heart attack Father   . Diabetes Brother   . Breast cancer Maternal Aunt   . Breast cancer Maternal Grandmother     Social History Social History   Tobacco Use  . Smoking status: Never Smoker  . Smokeless tobacco: Never Used  Substance Use Topics  . Alcohol use: No  . Drug use: No    Review of Systems  Constitutional: Negative for fever. Eyes: Negative for visual changes. ENT: Negative for sore throat. Neck: No neck pain  Cardiovascular: Negative for chest pain. Respiratory: Negative for shortness of breath. Gastrointestinal: Negative for abdominal pain, vomiting or diarrhea. Genitourinary: Negative for dysuria. + vaginal bleeding Musculoskeletal: Negative for back pain. Skin: Negative for rash. Neurological: Negative for headaches, weakness or numbness. Psych: No SI or HI  ____________________________________________   PHYSICAL EXAM:  VITAL SIGNS: ED Triage Vitals  Enc Vitals Group     BP 01/10/18 1759 (!) 220/85     Pulse Rate 01/10/18 1759 65     Resp 01/10/18 1759  18     Temp 01/10/18 1759 98.8 F (37.1 C)     Temp Source 01/10/18 1759 Oral     SpO2 01/10/18 1759 97 %     Weight 01/10/18 1800 197 lb 15.6 oz (89.8 kg)     Height --      Head Circumference --      Peak Flow --      Pain Score 01/10/18 1800 0     Pain Loc --      Pain Edu? --      Excl. in [place]? --     Constitutional: Alert and oriented. Well appearing and in no apparent distress. HEENT:      Head: Normocephalic and atraumatic.         Eyes: Conjunctivae are normal. Sclera is non-icteric.       Mouth/Throat: Mucous membranes are moist.       Neck: Supple with no signs of meningismus. Cardiovascular: Regular rate and rhythm. No murmurs, gallops, or rubs. 2+ symmetrical distal pulses are present in all extremities. No JVD. Respiratory: Normal respiratory effort. Lungs are clear to auscultation bilaterally. No wheezes, crackles, or rhonchi.  Gastrointestinal: Soft, non tender, and non distended with positive bowel sounds. No rebound or guarding. Pelvic exam: Normal external genitalia, no rashes or lesions. Normal cervical mucus. Os closed with small amount seen. No cervical motion tenderness.  No uterine or adnexal tenderness.   Musculoskeletal: Nontender with normal range of motion in all extremities. No edema, cyanosis, or erythema of extremities. Neurologic: Normal speech and language. Face is symmetric. Moving all extremities. No gross focal neurologic deficits are appreciated. Skin: Skin is warm, dry and intact. No rash noted. Psychiatric: Mood and affect are normal. Speech and behavior are normal.  ____________________________________________   LABS (all labs ordered are listed, but only abnormal results are displayed)  Labs Reviewed  WET PREP, GENITAL - Abnormal; Notable for the following components:      Result Value   WBC, Wet Prep HPF POC FEW (*)    All other components within normal limits  COMPREHENSIVE METABOLIC PANEL - Abnormal; Notable for the following  components:   Glucose, Bld 107 (*)    Calcium 8.6 (*)    All other components within normal limits  PROTIME-INR - Abnormal; Notable for the following components:   Prothrombin Time 15.3 (*)    All other components within normal limits  URINALYSIS, COMPLETE (UACMP) WITH MICROSCOPIC - Abnormal; Notable for the following components:   Color, Urine STRAW (*)    APPearance CLEAR (*)    Hgb urine dipstick LARGE (*)    RBC / HPF >50 (*)    All other components within normal limits  APTT - Abnormal; Notable for the following components:   aPTT 40 (*)    All other components within normal limits  CHLAMYDIA/NGC RT PCR ([facility] ONLY)  CBC WITH DIFFERENTIAL/PLATELET   ____________________________________________  EKG  ED ECG REPORT I, Rudene Re, the attending physician,  personally viewed and interpreted this ECG.  Normal sinus rhythm with first-degree AV block, rate of 71, left bundle branch block, left axis deviation, no ST elevations or depressions.  Unchanged from prior. ____________________________________________  RADIOLOGY  I have personally reviewed the images performed during this visit and I agree with the Radiologist's read.   Interpretation by Radiologist:  No results found.    ____________________________________________   PROCEDURES  Procedure(s) performed: None Procedures Critical Care performed:  None ____________________________________________   INITIAL IMPRESSION / ASSESSMENT AND PLAN / ED COURSE  67 y.o. female with a history of hypertension, CAD on Eliquis, CHF, arthritis who presents for evaluation of vaginal bleeding. HD stable. On eliquis for non obstructive CAD. hgb stable. Pelvic exam confirms uterine bleeding, no trauma. Ddx STD, GYN malignancy. TVUS pending. Wet prep, Gc/ chlamydia pending.     _________________________ 10:53 PM on 01/10/2018 -----------------------------------------  Pelvic swabs pending.  Ultrasound showing  abnormal and thickened endometrium.  Patient will be referred to [facility] for endometrial biopsy to rule out endometrial cancer.  Discussed this recommendation and close follow-up with patient.  Discussed return precautions for any signs of acute blood loss anemia or increased vaginal bleeding.  In the meantime recommended she continues on the Eliquis.   As part of my medical decision making, I reviewed the following data within the [facility] notes reviewed and incorporated, Labs reviewed , EKG interpreted , Old EKG reviewed, Old chart reviewed, Radiograph reviewed , Notes from prior ED visits and [place] Controlled Substance Database   Pertinent labs & imaging results that were available during my care of the patient were reviewed by me and considered in my medical decision making (see chart for details).    ____________________________________________   FINAL CLINICAL IMPRESSION(S) / ED DIAGNOSES  Final diagnoses:  Postmenopausal bleeding  Abnormal ultrasound of endometrium      NEW MEDICATIONS STARTED DURING THIS VISIT:  ED Discharge Orders    None       Note:  This document was prepared using Dragon voice recognition software and may include unintentional dictation errors.    Rudene Re, MD 01/11/18 [phone]

## 2018-01-10 NOTE — Discharge Instructions (Signed)
As I explained to you, you need to see OB/GYN for a biopsy of your uterus to rule out cancer.  Make sure to call [facility] as soon as possible for a visit within the next week.  Return to the emergency room if you have severe bleeding or passing large clots, if you feel dizzy or develop abdominal pain.

## 2018-01-11 LAB — CHLAMYDIA/NGC RT PCR (ARMC ONLY)
CHLAMYDIA TR: NOT DETECTED
N GONORRHOEAE: NOT DETECTED

## 2018-01-14 ENCOUNTER — Other Ambulatory Visit (HOSPITAL_COMMUNITY)
Admission: RE | Admit: 2018-01-14 | Discharge: 2018-01-14 | Disposition: A | Discharge status: Home / Self Care | Payer: Medicare HMO | Source: Ambulatory Visit | Attending: Obstetrics and Gynecology | Admitting: Obstetrics and Gynecology

## 2018-01-14 ENCOUNTER — Encounter: Payer: Self-pay | Admitting: Obstetrics and Gynecology

## 2018-01-14 ENCOUNTER — Ambulatory Visit (INDEPENDENT_AMBULATORY_CARE_PROVIDER_SITE_OTHER): Payer: Medicare HMO | Admitting: Obstetrics and Gynecology

## 2018-01-14 VITALS — BP 174/100 | HR 66 | Ht 66.0 in | Wt 200.0 lb

## 2018-01-14 DIAGNOSIS — Z124 Encounter for screening for malignant neoplasm of cervix: Secondary | ICD-10-CM

## 2018-01-14 DIAGNOSIS — Z113 Encounter for screening for infections with a predominantly sexual mode of transmission: Secondary | ICD-10-CM | POA: Diagnosis not present

## 2018-01-14 DIAGNOSIS — R9389 Abnormal findings on diagnostic imaging of other specified body structures: Secondary | ICD-10-CM | POA: Diagnosis not present

## 2018-01-14 DIAGNOSIS — N950 Postmenopausal bleeding: Secondary | ICD-10-CM | POA: Insufficient documentation

## 2018-01-14 NOTE — Progress Notes (Signed)
Gynecology H&P  Chief Complaint:  Chief Complaint  Patient presents with  . post menapausal bleeding    ER follow up    History of Present Illness: Patient is a 67 y.o. No obstetric history on file. presents evaluation of postmenopausal bleeding. The patient states she has had a single episode(s) of bleeding in the past 1 week(s).  The most recent episode occurred  1  week(s) ago.  The bleeding has been limited to spotting . She describes the blood as Bright red in appearance.  She has had no additional complaints. She deniesbloating, cramping, early satiety and abdominal pain. Reports inciting event intercourse after extended period of abstinence.  She does not have a history of abnormal pap smears.  The patient is currently sexually active and has noted postcoital bleeding There are no other aggravating factors reported. There are no alleviating factors reported. The patient's past medical history is contributory for Eliquis use.   She has had prior work up for postmenopausal bleeding.  ER ultrasound showing endometrial stipe heteronomous and thickened at 75mm.  No family history of uterine cancer.  GC/CT and wet mount negative in ER.   Review of Systems: 10 point review of systems negative unless otherwise noted in HPI  Past Medical History:  Past Medical History:  Diagnosis Date  . Arthritis   . CHF (congestive heart failure) ([place])   . Hypertension     Past Surgical History:  Past Surgical History:  Procedure Laterality Date  . LEFT HEART CATH AND CORONARY ANGIOGRAPHY Right 05/06/2016   Procedure: Left Heart Cath and Coronary Angiography;  Surgeon: [name], MD;  Location: [place] [facility];  Service: Cardiovascular;  Laterality: Right;    Family History:  Family History  Problem Relation Age of Onset  . Diabetes Mother   . Heart disease Mother   . Heart attack Father   . Diabetes Brother   . Breast cancer Maternal Aunt   . Breast cancer Maternal Grandmother      Social History:  Social History   Socioeconomic History  . Marital status: Widowed    Spouse name: Not on file  . Number of children: Not on file  . Years of education: Not on file  . Highest education level: Not on file  Occupational History  . Not on file  Social Needs  . Financial resource strain: Not on file  . Food insecurity:    Worry: Not on file    Inability: Not on file  . Transportation needs:    Medical: Not on file    Non-medical: Not on file  Tobacco Use  . Smoking status: Never Smoker  . Smokeless tobacco: Never Used  Substance and Sexual Activity  . Alcohol use: No  . Drug use: No  . Sexual activity: Not on file  Lifestyle  . Physical activity:    Days per week: Not on file    Minutes per session: Not on file  . Stress: Not on file  Relationships  . Social connections:    Talks on phone: Not on file    Gets together: Not on file    Attends religious service: Not on file    Active member of club or organization: Not on file    Attends meetings of clubs or organizations: Not on file    Relationship status: Not on file  . Intimate partner violence:    Fear of current or ex partner: Not on file    Emotionally abused:  Not on file    Physically abused: Not on file    Forced sexual activity: Not on file  Other Topics Concern  . Not on file  Social History Narrative  . Not on file    Allergies:  No Known Allergies  Medications: Prior to Admission medications   Medication Sig Start Date End Date Taking? Authorizing Provider  albuterol (PROVENTIL HFA;VENTOLIN HFA) 108 (90 Base) MCG/ACT inhaler Inhale 2 puffs into the lungs daily as needed for wheezing or shortness of breath. 06/11/16  Yes Alisa Graff, FNP  apixaban (ELIQUIS) 5 MG TABS tablet Take 1 tablet (5 mg total) by mouth 2 (two) times daily. 06/11/16  Yes Darylene Price A, FNP  aspirin EC 81 MG tablet Take 81 mg by mouth daily.   Yes [provider]  atorvastatin (LIPITOR) 40 MG  tablet Take 1 tablet (40 mg total) by mouth daily at 6 PM. 06/11/16  Yes Alisa Graff, FNP  carvedilol (COREG) 12.5 MG tablet Take 1 tablet (12.5 mg total) by mouth 2 (two) times daily with a meal. 06/11/16  Yes Darylene Price A, FNP  furosemide (LASIX) 20 MG tablet Take 1 tablet (20 mg total) by mouth daily. 06/11/16  Yes Hackney, Tina A, FNP  sacubitril-valsartan (ENTRESTO) 24-26 MG Take 1 tablet by mouth 2 (two) times daily. 05/13/16  Yes Darylene Price A, FNP  spironolactone (ALDACTONE) 25 MG tablet Take 1 tablet (25 mg total) by mouth daily. 06/11/16  Yes Darylene Price A, FNP  diclofenac sodium (VOLTAREN) 1 % GEL Apply 2 g topically 3 (three) times daily as needed (pain). Patient not taking: Reported on 01/14/2018 08/29/16   Merlyn Lot, MD  gabapentin (NEURONTIN) 100 MG capsule Take 1 capsule (100 mg total) by mouth 2 (two) times daily. 08/29/16 09/10/16  Merlyn Lot, MD    Physical Exam Vitals: Blood pressure (!) 174/100, pulse 66, height 5\' 6"  (1.676 m), weight 200 lb (90.7 kg).  General: NAD HEENT: normocephalic, anicteric Neck: Thyroid non-enlarged, no nodules Pulmonary: No increased work of breathing Abdomen: NABS, soft, non-tender, non-distended.  Umbilicus without lesions.  No hepatomegaly, splenomegaly or masses palpable. No evidence of hernia  Genitourinary:  External: Normal external female genitalia.  Normal urethral meatus, normal Bartholin's and Skene's glands.    Vagina: Normal vaginal mucosa with age appropriate atrophic changes, no evidence of prolapse.    Cervix: Grossly normal in appearance, no bleeding  Uterus: Non-enlarged, mobile, normal contour.  No CMT  Adnexa: ovaries non-enlarged, no adnexal masses  Rectal: deferred Extremities: no edema, erythema, or tenderness Neurologic: Grossly intact Psychiatric: mood appropriate, affect full   ENDOMETRIAL BIOPSY     The indications for endometrial biopsy were reviewed.   Risks of the biopsy including cramping,  bleeding, infection, uterine perforation, inadequate specimen and need for additional procedures  were discussed. The patient states she understands and agrees to undergo procedure today. Consent was signed. Time out was performed. Urine HCG was negative. A Graves speculum was placed and the cervix was brought into view.  The cervix was prepped with Betadine. A single-toothed tenaculum was  placed on the anterior lip of the cervix for traction. A 3 mm pipelle was introduced through the cervix into the endometrial cavity without difficulty to a depth of 7 cm, and a small amount of tissue was obtained in three passes as first pass was mostly blood, the resulting specime sent to pathology. The instruments were removed from the patient's vagina. Minimal bleeding from the cervix was noted.  The patient tolerated the procedure well. Routine post-procedure instructions were given to the patient.  She will be contacted by phone one results become available.     Assessment: 67 y.o. No obstetric history on file. presenting for evaluation of postmenopausal bleeding  Plan: Problem List Items Addressed This Visit    None    Visit Diagnoses    Postmenopausal bleeding    -  Primary   Relevant Orders   Surgical pathology   Thickened endometrium       Relevant Orders   Surgical pathology   Screening for malignant neoplasm of cervix       Relevant Orders   Cytology - PAP   Routine screening for STI (sexually transmitted infection)       Relevant Orders   Cytology - PAP      1) We discussed that menopause is a clinical diagnosis made after 12 months of amenorrhea.  The average age of menopause in the  [place] population is 35 but there may be significant variation.  Any bleeding that happens after a 12 month period of amenorrhea warrants further work.  Possible etiologies of postmenopausal bleeding were discussed with the patient today.  These may range from benign etiologies such as urethral prolapse and  atrophy, to indeterminate lesions such as submucosal fibroids or polyps which would require resection to accurately evaluate. The role of unopposed estrogen in the development of  dndometrial hyperplasia or carcinoma is discussed.  The risk of endometrial hyperplasia is linearly correlated with increasing BMI given the production of estrone by adipose tissue.  Work up will be include transvaginal ultrasound to assess the thickness of the endometrial lining as well as to assess for focal uterine lesions.  Negative ultrasound evaluation, defined as the absence of focal lesions and endometrial stripe of <31mm, effectively rules out carcinoma and confirms atrophy as the most likely etiology.  Should focal lesions be present these generally require hysteroscopic resection.  Should lining be greater >58mm endometrial biopsy is warranted to rule out hyperplasia or frank endometrial cancer.  Continued episodes of bleeding despite negative ultrasound also warrant endometrial sampling.  As the cervical pathology may also be implicated in postmenopausal bleeding prior cervical cytology was reviewed and repeated if required per ASCCP guidelines.  - pap obtained along with cultures  2) Prior pelvic ultrasound reviewed. EMB discussed and performed as well. Pros and cons of these modalities of testing discussed.  - Will contact with results of endometrial biopsy  3) No follow-ups on file.    Malachy Mood, MD, [facility], [place]

## 2018-01-19 ENCOUNTER — Telehealth: Payer: Self-pay

## 2018-01-19 LAB — CYTOLOGY - PAP
CHLAMYDIA, DNA PROBE: NEGATIVE
Diagnosis: NEGATIVE
Neisseria Gonorrhea: NEGATIVE
TRICH (WINDOWPATH): NEGATIVE

## 2018-01-19 NOTE — Telephone Encounter (Signed)
April calling in reference to patient. Requesting a return call. Cb#[phone]

## 2018-01-19 NOTE — Telephone Encounter (Signed)
Spoke w/April. She states her mom was here for a visit & she was calling for results. She wants to be sure her mom doesn't have cancer.

## 2018-02-03 ENCOUNTER — Other Ambulatory Visit: Payer: Self-pay | Admitting: Registered Nurse

## 2021-01-02 ENCOUNTER — Emergency Department
Admission: EM | Admit: 2021-01-02 | Discharge: 2021-01-02 | Disposition: A | Discharge status: Home / Self Care | Payer: Medicare Other | Attending: Emergency Medicine | Admitting: Emergency Medicine

## 2021-01-02 ENCOUNTER — Other Ambulatory Visit: Payer: Self-pay

## 2021-01-02 DIAGNOSIS — Z79899 Other long term (current) drug therapy: Secondary | ICD-10-CM | POA: Insufficient documentation

## 2021-01-02 DIAGNOSIS — I110 Hypertensive heart disease with heart failure: Secondary | ICD-10-CM | POA: Insufficient documentation

## 2021-01-02 DIAGNOSIS — Z7901 Long term (current) use of anticoagulants: Secondary | ICD-10-CM | POA: Diagnosis not present

## 2021-01-02 DIAGNOSIS — I5022 Chronic systolic (congestive) heart failure: Secondary | ICD-10-CM | POA: Insufficient documentation

## 2021-01-02 DIAGNOSIS — Z7982 Long term (current) use of aspirin: Secondary | ICD-10-CM | POA: Insufficient documentation

## 2021-01-02 DIAGNOSIS — R251 Tremor, unspecified: Secondary | ICD-10-CM | POA: Insufficient documentation

## 2021-01-02 DIAGNOSIS — R55 Syncope and collapse: Secondary | ICD-10-CM | POA: Insufficient documentation

## 2021-01-02 LAB — CBC WITH DIFFERENTIAL/PLATELET
Abs Immature Granulocytes: 0 10*3/uL (ref 0.00–0.07)
Basophils Absolute: 0 10*3/uL (ref 0.0–0.1)
Basophils Relative: 1 %
Eosinophils Absolute: 0 10*3/uL (ref 0.0–0.5)
Eosinophils Relative: 0 %
HCT: 30.3 % — ABNORMAL LOW (ref 36.0–46.0)
Hemoglobin: 9.8 g/dL — ABNORMAL LOW (ref 12.0–15.0)
Immature Granulocytes: 0 %
Lymphocytes Relative: 28 %
Lymphs Abs: 0.6 10*3/uL — ABNORMAL LOW (ref 0.7–4.0)
MCH: 28.2 pg (ref 26.0–34.0)
MCHC: 32.3 g/dL (ref 30.0–36.0)
MCV: 87.3 fL (ref 80.0–100.0)
Monocytes Absolute: 0.1 10*3/uL (ref 0.1–1.0)
Monocytes Relative: 4 %
Neutro Abs: 1.6 10*3/uL — ABNORMAL LOW (ref 1.7–7.7)
Neutrophils Relative %: 67 %
Platelets: 148 10*3/uL — ABNORMAL LOW (ref 150–400)
RBC: 3.47 MIL/uL — ABNORMAL LOW (ref 3.87–5.11)
RDW: 14 % (ref 11.5–15.5)
WBC: 2.3 10*3/uL — ABNORMAL LOW (ref 4.0–10.5)
nRBC: 0 % (ref 0.0–0.2)

## 2021-01-02 LAB — COMPREHENSIVE METABOLIC PANEL
ALT: 12 U/L (ref 0–44)
AST: 21 U/L (ref 15–41)
Albumin: 3.2 g/dL — ABNORMAL LOW (ref 3.5–5.0)
Alkaline Phosphatase: 60 U/L (ref 38–126)
Anion gap: 10 (ref 5–15)
BUN: 21 mg/dL (ref 8–23)
CO2: 21 mmol/L — ABNORMAL LOW (ref 22–32)
Calcium: 8.4 mg/dL — ABNORMAL LOW (ref 8.9–10.3)
Chloride: 104 mmol/L (ref 98–111)
Creatinine, Ser: 0.77 mg/dL (ref 0.44–1.00)
GFR, Estimated: >60 mL/min (ref >60)
Glucose, Bld: 123 mg/dL — ABNORMAL HIGH (ref 70–99)
Potassium: 3.8 mmol/L (ref 3.5–5.1)
Sodium: 135 mmol/L (ref 135–145)
Total Bilirubin: 0.6 mg/dL (ref 0.3–1.2)
Total Protein: 7.3 g/dL (ref 6.5–8.1)

## 2021-01-02 LAB — URINALYSIS, ROUTINE W REFLEX MICROSCOPIC
Bilirubin Urine: NEGATIVE
Glucose, UA: NEGATIVE mg/dL
Hgb urine dipstick: NEGATIVE
Ketones, ur: 5 mg/dL — AB
Nitrite: NEGATIVE
Protein, ur: 30 mg/dL — AB
Specific Gravity, Urine: 1.019 (ref 1.005–1.030)
pH: 5 (ref 5.0–8.0)

## 2021-01-02 LAB — CBC
HCT: 29.3 % — ABNORMAL LOW (ref 36.0–46.0)
Hemoglobin: 9.6 g/dL — ABNORMAL LOW (ref 12.0–15.0)
MCH: 28.7 pg (ref 26.0–34.0)
MCHC: 32.8 g/dL (ref 30.0–36.0)
MCV: 87.5 fL (ref 80.0–100.0)
Platelets: 138 10*3/uL — ABNORMAL LOW (ref 150–400)
RBC: 3.35 MIL/uL — ABNORMAL LOW (ref 3.87–5.11)
RDW: 13.9 % (ref 11.5–15.5)
WBC: 3.4 10*3/uL — ABNORMAL LOW (ref 4.0–10.5)
nRBC: 0 % (ref 0.0–0.2)

## 2021-01-02 LAB — BASIC METABOLIC PANEL
Anion gap: 8 (ref 5–15)
BUN: 20 mg/dL (ref 8–23)
CO2: 21 mmol/L — ABNORMAL LOW (ref 22–32)
Calcium: 8.1 mg/dL — ABNORMAL LOW (ref 8.9–10.3)
Chloride: 106 mmol/L (ref 98–111)
Creatinine, Ser: 0.99 mg/dL (ref 0.44–1.00)
GFR, Estimated: >60 mL/min (ref >60)
Glucose, Bld: 142 mg/dL — ABNORMAL HIGH (ref 70–99)
Potassium: 3.6 mmol/L (ref 3.5–5.1)
Sodium: 135 mmol/L (ref 135–145)

## 2021-01-02 NOTE — ED Triage Notes (Signed)
Pt to ED [facility] from home for possible syncope today. Ems states pt took tylenol today and not supposed to take tylenol with another med. Family reports had syncopal episode while sitting in chair. EMS reports pt was alert and oriented enroute. Pt in NAD, alert and oriented. Denies pain, dizziness

## 2021-01-02 NOTE — ED Provider Notes (Signed)
Emergency Medicine Provider Triage Evaluation Note  Mackenzie [name] , a 70 y.o. female  was evaluated in triage. Arrives via EMS with no medical complaints. Family believes she had a brief syncopal episode this morning. She was sitting in the chair and slumped over. EMS notified she was back to baseline while they were en route.  Review of Systems  Positive: No complaints Negative:   Physical Exam  BP (!) 104/53   Pulse 67   Temp 97.7 F (36.5 C) (Oral)   Resp 18   Ht 5\' 6"  (1.676 m)   Wt 90.7 kg   SpO2 96%   BMI 32.28 kg/m  Gen:   Awake, no distress   Resp:  Normal effort  MSK:   Moves extremities without difficulty  Other:    Medical Decision Making  Medically screening exam initiated at 12:29 PM.  Appropriate orders placed.  Mackenzie [name] was informed that the remainder of the evaluation will be completed by another provider, this initial triage assessment does not replace that evaluation, and the importance of remaining in the ED until their evaluation is complete.   Victorino Dike, FNP 01/02/21 1232    Lucrezia Starch, MD 01/02/21 [phone]

## 2021-01-02 NOTE — ED Provider Notes (Signed)
[facility] Emergency Department Provider Note ____________________________________________   Event Date/Time   First MD Initiated Contact with Patient 01/02/21 2117     (approximate)  I have reviewed the triage vital signs and the nursing notes.  HISTORY  Chief Complaint Loss of Consciousness   HPI Mackenzie [name] is a 70 y.o. femalewho presents to the ED for evaluation of possible syncopal episode.  Chart review indicates obese patient with history of HTN and CHF.  Patient presents to the ED, accompanied by her daughter, for evaluation of possible syncope and shaking.  History obtained by patient and daughter at the bedside.  Patient reports that she has been doing fine without recent illnesses.  She reports being seated this morning when she got up to walk down the hall, and reports feeling presyncopal lightheaded dizziness upon standing such that she stumbled a little bit in the hallway so she went to go sit down but reports it being "too late."  She reports a brief period of unconsciousness.  Daughter reports that patient was witnessed slumping over to the side and had some gentle diffuse shaking to her 4 extremities.  No tongue biting or urinary incontinence.  No postictal period.  Since then, she has been at her baseline.  Has ambulated in the ED without symptoms.  Denies any symptoms now.  She unfortunately waited about 9.5 hours in our waiting room due to nursing staffing issues and bed holds prior to my evaluation, and she reports no events in that timeframe.  She reports feeling fine and is requesting discharge.  Past Medical History:  Diagnosis Date   Arthritis    CHF (congestive heart failure) ([place])    Hypertension     Patient Active Problem List   Diagnosis Date Noted   Chronic systolic heart failure ([place]) 05/14/2016   HTN (hypertension) 05/14/2016   Snoring 05/14/2016    Past Surgical History:  Procedure Laterality Date   LEFT HEART CATH  AND CORONARY ANGIOGRAPHY Right 05/06/2016   Procedure: Left Heart Cath and Coronary Angiography;  Surgeon: [name], MD;  Location: [place] [facility];  Service: Cardiovascular;  Laterality: Right;    Prior to Admission medications   Medication Sig Start Date End Date Taking? Authorizing Provider  albuterol (PROVENTIL HFA;VENTOLIN HFA) 108 (90 Base) MCG/ACT inhaler Inhale 2 puffs into the lungs daily as needed for wheezing or shortness of breath. 06/11/16   Alisa Graff, FNP  apixaban (ELIQUIS) 5 MG TABS tablet Take 1 tablet (5 mg total) by mouth 2 (two) times daily. 06/11/16   Alisa Graff, FNP  aspirin EC 81 MG tablet Take 81 mg by mouth daily.    [provider]  atorvastatin (LIPITOR) 40 MG tablet Take 1 tablet (40 mg total) by mouth daily at 6 PM. 06/11/16   Alisa Graff, FNP  carvedilol (COREG) 12.5 MG tablet Take 1 tablet (12.5 mg total) by mouth 2 (two) times daily with a meal. 06/11/16   Darylene Price A, FNP  diclofenac sodium (VOLTAREN) 1 % GEL Apply 2 g topically 3 (three) times daily as needed (pain). Patient not taking: Reported on 01/14/2018 08/29/16   Merlyn Lot, MD  furosemide (LASIX) 20 MG tablet Take 1 tablet (20 mg total) by mouth daily. 06/11/16   Alisa Graff, FNP  gabapentin (NEURONTIN) 100 MG capsule Take 1 capsule (100 mg total) by mouth 2 (two) times daily. 08/29/16 09/10/16  Merlyn Lot, MD  sacubitril-valsartan (ENTRESTO) 24-26 MG Take  1 tablet by mouth 2 (two) times daily. 05/13/16   Alisa Graff, FNP  spironolactone (ALDACTONE) 25 MG tablet Take 1 tablet (25 mg total) by mouth daily. 06/11/16   Alisa Graff, FNP    Allergies Patient has no known allergies.  Family History  Problem Relation Age of Onset   Diabetes Mother    Heart disease Mother    Heart attack Father    Diabetes Brother    Breast cancer Maternal Aunt    Breast cancer Maternal Grandmother     Social History Social History   Tobacco Use   Smoking  status: Never   Smokeless tobacco: Never  Substance Use Topics   Alcohol use: No   Drug use: No    Review of Systems  Constitutional: No fever/chills Eyes: No visual changes. ENT: No sore throat. Cardiovascular: Denies chest pain. Respiratory: Denies shortness of breath. Gastrointestinal: No abdominal pain.  No nausea, no vomiting.  No diarrhea.  No constipation. Genitourinary: Negative for dysuria. Musculoskeletal: Negative for back pain. Skin: Negative for rash. Neurological: Negative for headaches, focal weakness or numbness. Positive for syncopal episode  ____________________________________________   PHYSICAL EXAM:  VITAL SIGNS: Vitals:   01/02/21 1944 01/02/21 2200  BP: 130/65 134/79  Pulse: 78 80  Resp: 18 18  Temp:    SpO2: 98% 100%     Constitutional: Alert and oriented. Well appearing and in no acute distress. Eyes: Conjunctivae are normal. PERRL. EOMI. Head: Atraumatic. Nose: No congestion/rhinnorhea. Mouth/Throat: Mucous membranes are moist.  Oropharynx non-erythematous. Neck: No stridor. No cervical spine tenderness to palpation. Cardiovascular: Normal rate, regular rhythm. Grossly normal heart sounds.  Good peripheral circulation. Respiratory: Normal respiratory effort.  No retractions. Lungs CTAB. Gastrointestinal: Soft , nondistended, nontender to palpation. No CVA tenderness. Musculoskeletal: No lower extremity tenderness nor edema.  No joint effusions. No signs of acute trauma. Neurologic:  Normal speech and language. No gross focal neurologic deficits are appreciated. No gait instability noted. Skin:  Skin is warm, dry and intact. No rash noted. Psychiatric: Mood and affect are normal. Speech and behavior are normal.  ____________________________________________   LABS (all labs ordered are listed, but only abnormal results are displayed)  Labs Reviewed  BASIC METABOLIC PANEL - Abnormal; Notable for the following components:      Result  Value   CO2 21 (*)    Glucose, Bld 142 (*)    Calcium 8.1 (*)    All other components within normal limits  CBC - Abnormal; Notable for the following components:   WBC 3.4 (*)    RBC 3.35 (*)    Hemoglobin 9.6 (*)    HCT 29.3 (*)    Platelets 138 (*)    All other components within normal limits  URINALYSIS, ROUTINE W REFLEX MICROSCOPIC - Abnormal; Notable for the following components:   Color, Urine YELLOW (*)    APPearance HAZY (*)    Ketones, ur 5 (*)    Protein, ur 30 (*)    Leukocytes,Ua TRACE (*)    Bacteria, UA RARE (*)    All other components within normal limits  CBC WITH DIFFERENTIAL/PLATELET - Abnormal; Notable for the following components:   WBC 2.3 (*)    RBC 3.47 (*)    Hemoglobin 9.8 (*)    HCT 30.3 (*)    Platelets 148 (*)    Neutro Abs 1.6 (*)    Lymphs Abs 0.6 (*)    All other components within normal limits  COMPREHENSIVE METABOLIC PANEL  ____________________________________________  12 Lead EKG  Sinus rhythm with a rate of 69 bpm.  Leftward axis.  Left bundle.  1 PVC.  No evidence of acute ischemia per S[place] criteria.  Similar to EKG from 2019 ____________________________________________  RADIOLOGY  ED MD interpretation:    Official radiology report(s): No results found.  ____________________________________________   PROCEDURES and INTERVENTIONS  Procedure(s) performed (including Critical Care):  .1-3 Lead EKG Interpretation Performed by: Vladimir Crofts, MD Authorized by: Vladimir Crofts, MD     Interpretation: normal     ECG rate:  80   ECG rate assessment: normal     Rhythm: sinus rhythm     Ectopy: none     Conduction: normal    Medications - No data to display  ____________________________________________   MDM / ED COURSE   70 year old female presents to the ED after an episode of syncope, I suspect a vasovagal episode.  She looks well here in the ED and is asymptomatic.  Reassuring neurologic examination without  evidence of deficits or vascular deficits.  No signs of trauma or injuries.  Less likely seizure.  Less likely intracranial hemorrhage or other CNS pathology.  Normal vitals.  Blood work with normocytic anemia, but no comparison for many years.  She has no bleeding symptoms.  Due to her waiting nearly 10 hours in the waiting room, I repeat these tests which are stable without evidence of dropping hemoglobin.  UA with small ketonuria, suggestive of dehydration which could certainly precipitate a vasovagal episode.  She is tolerating p.o. fluids ,.  After this we will ambulate and anticipate outpatient management.  Clinical Course as of 01/02/21 2232  Tue Jan 02, 2021  2210 Provided the patient a cup of water and we discussed p.o. challenge, urinalysis and ambulatory trial prior to expected outpatient management. [DS]    Clinical Course User Index [DS] Vladimir Crofts, MD    ____________________________________________   FINAL CLINICAL IMPRESSION(S) / ED DIAGNOSES  Final diagnoses:  Vasovagal episode     ED Discharge Orders     None        Kemet Nijjar Tamala Julian   Note:  This document was prepared using Dragon voice recognition software and may include unintentional dictation errors.    Vladimir Crofts, MD 01/02/21 2233

## 2021-01-15 ENCOUNTER — Other Ambulatory Visit: Payer: Self-pay

## 2021-01-15 ENCOUNTER — Encounter (HOSPITAL_COMMUNITY): Payer: Self-pay

## 2021-01-15 ENCOUNTER — Inpatient Hospital Stay (HOSPITAL_COMMUNITY)
Admission: EM | Admit: 2021-01-15 | Discharge: 2021-01-20 | DRG: 377 | Disposition: A | Discharge status: Home / Self Care | Payer: Medicare Other | Attending: Internal Medicine | Admitting: Internal Medicine

## 2021-01-15 ENCOUNTER — Emergency Department (HOSPITAL_COMMUNITY): Payer: Medicare Other

## 2021-01-15 DIAGNOSIS — Z833 Family history of diabetes mellitus: Secondary | ICD-10-CM

## 2021-01-15 DIAGNOSIS — E1169 Type 2 diabetes mellitus with other specified complication: Secondary | ICD-10-CM | POA: Diagnosis not present

## 2021-01-15 DIAGNOSIS — K3189 Other diseases of stomach and duodenum: Secondary | ICD-10-CM | POA: Diagnosis present

## 2021-01-15 DIAGNOSIS — I5022 Chronic systolic (congestive) heart failure: Secondary | ICD-10-CM | POA: Diagnosis not present

## 2021-01-15 DIAGNOSIS — I4891 Unspecified atrial fibrillation: Secondary | ICD-10-CM | POA: Diagnosis present

## 2021-01-15 DIAGNOSIS — I10 Essential (primary) hypertension: Secondary | ICD-10-CM | POA: Diagnosis not present

## 2021-01-15 DIAGNOSIS — J101 Influenza due to other identified influenza virus with other respiratory manifestations: Secondary | ICD-10-CM | POA: Diagnosis not present

## 2021-01-15 DIAGNOSIS — E669 Obesity, unspecified: Secondary | ICD-10-CM | POA: Diagnosis not present

## 2021-01-15 DIAGNOSIS — M1990 Unspecified osteoarthritis, unspecified site: Secondary | ICD-10-CM | POA: Diagnosis present

## 2021-01-15 DIAGNOSIS — I482 Chronic atrial fibrillation, unspecified: Secondary | ICD-10-CM | POA: Diagnosis present

## 2021-01-15 DIAGNOSIS — K921 Melena: Secondary | ICD-10-CM | POA: Diagnosis present

## 2021-01-15 DIAGNOSIS — E119 Type 2 diabetes mellitus without complications: Secondary | ICD-10-CM | POA: Diagnosis present

## 2021-01-15 DIAGNOSIS — E785 Hyperlipidemia, unspecified: Secondary | ICD-10-CM | POA: Diagnosis present

## 2021-01-15 DIAGNOSIS — K644 Residual hemorrhoidal skin tags: Secondary | ICD-10-CM | POA: Diagnosis present

## 2021-01-15 DIAGNOSIS — Z8249 Family history of ischemic heart disease and other diseases of the circulatory system: Secondary | ICD-10-CM | POA: Diagnosis not present

## 2021-01-15 DIAGNOSIS — E538 Deficiency of other specified B group vitamins: Secondary | ICD-10-CM | POA: Diagnosis present

## 2021-01-15 DIAGNOSIS — J1100 Influenza due to unidentified influenza virus with unspecified type of pneumonia: Secondary | ICD-10-CM | POA: Diagnosis present

## 2021-01-15 DIAGNOSIS — D62 Acute posthemorrhagic anemia: Secondary | ICD-10-CM | POA: Diagnosis present

## 2021-01-15 DIAGNOSIS — Z20822 Contact with and (suspected) exposure to covid-19: Secondary | ICD-10-CM | POA: Diagnosis present

## 2021-01-15 DIAGNOSIS — D509 Iron deficiency anemia, unspecified: Secondary | ICD-10-CM | POA: Diagnosis not present

## 2021-01-15 DIAGNOSIS — Z79899 Other long term (current) drug therapy: Secondary | ICD-10-CM

## 2021-01-15 DIAGNOSIS — I110 Hypertensive heart disease with heart failure: Secondary | ICD-10-CM | POA: Diagnosis present

## 2021-01-15 DIAGNOSIS — J1000 Influenza due to other identified influenza virus with unspecified type of pneumonia: Secondary | ICD-10-CM | POA: Diagnosis present

## 2021-01-15 DIAGNOSIS — I5042 Chronic combined systolic (congestive) and diastolic (congestive) heart failure: Secondary | ICD-10-CM | POA: Diagnosis not present

## 2021-01-15 DIAGNOSIS — Z7901 Long term (current) use of anticoagulants: Secondary | ICD-10-CM | POA: Diagnosis not present

## 2021-01-15 DIAGNOSIS — R059 Cough, unspecified: Secondary | ICD-10-CM | POA: Diagnosis present

## 2021-01-15 DIAGNOSIS — I959 Hypotension, unspecified: Secondary | ICD-10-CM | POA: Diagnosis present

## 2021-01-15 DIAGNOSIS — R195 Other fecal abnormalities: Secondary | ICD-10-CM | POA: Diagnosis not present

## 2021-01-15 DIAGNOSIS — E861 Hypovolemia: Secondary | ICD-10-CM | POA: Diagnosis present

## 2021-01-15 DIAGNOSIS — R55 Syncope and collapse: Secondary | ICD-10-CM | POA: Diagnosis present

## 2021-01-15 DIAGNOSIS — Z6832 Body mass index (BMI) 32.0-32.9, adult: Secondary | ICD-10-CM

## 2021-01-15 DIAGNOSIS — K449 Diaphragmatic hernia without obstruction or gangrene: Secondary | ICD-10-CM | POA: Diagnosis present

## 2021-01-15 DIAGNOSIS — D649 Anemia, unspecified: Secondary | ICD-10-CM | POA: Diagnosis not present

## 2021-01-15 HISTORY — DX: Unspecified atrial fibrillation: I48.91

## 2021-01-15 HISTORY — DX: Hyperlipidemia, unspecified: E78.5

## 2021-01-15 LAB — RETICULOCYTES
Immature Retic Fract: 19.3 % — ABNORMAL HIGH (ref 2.3–15.9)
RBC.: 2.43 MIL/uL — ABNORMAL LOW (ref 3.87–5.11)
Retic Count, Absolute: 42.5 10*3/uL (ref 19.0–186.0)
Retic Ct Pct: 1.8 % (ref 0.4–3.1)

## 2021-01-15 LAB — CBC WITH DIFFERENTIAL/PLATELET
Abs Immature Granulocytes: 0.01 10*3/uL (ref 0.00–0.07)
Basophils Absolute: 0 10*3/uL (ref 0.0–0.1)
Basophils Relative: 1 %
Eosinophils Absolute: 0 10*3/uL (ref 0.0–0.5)
Eosinophils Relative: 0 %
HCT: 21.7 % — ABNORMAL LOW (ref 36.0–46.0)
Hemoglobin: 7 g/dL — ABNORMAL LOW (ref 12.0–15.0)
Immature Granulocytes: 0 %
Lymphocytes Relative: 26 %
Lymphs Abs: 0.7 10*3/uL (ref 0.7–4.0)
MCH: 28.2 pg (ref 26.0–34.0)
MCHC: 32.3 g/dL (ref 30.0–36.0)
MCV: 87.5 fL (ref 80.0–100.0)
Monocytes Absolute: 0.1 10*3/uL (ref 0.1–1.0)
Monocytes Relative: 4 %
Neutro Abs: 1.8 10*3/uL (ref 1.7–7.7)
Neutrophils Relative %: 69 %
Platelets: 243 10*3/uL (ref 150–400)
RBC: 2.48 MIL/uL — ABNORMAL LOW (ref 3.87–5.11)
RDW: 14.2 % (ref 11.5–15.5)
WBC: 2.7 10*3/uL — ABNORMAL LOW (ref 4.0–10.5)
nRBC: 0 % (ref 0.0–0.2)

## 2021-01-15 LAB — HEMOCCULT GUIAC POC 1CARD (OFFICE): Fecal Occult Blood, POC: POSITIVE — AB

## 2021-01-15 LAB — COMPREHENSIVE METABOLIC PANEL
ALT: 10 U/L (ref 0–44)
AST: 17 U/L (ref 15–41)
Albumin: 2.4 g/dL — ABNORMAL LOW (ref 3.5–5.0)
Alkaline Phosphatase: 37 U/L — ABNORMAL LOW (ref 38–126)
Anion gap: 9 (ref 5–15)
BUN: 18 mg/dL (ref 8–23)
CO2: 20 mmol/L — ABNORMAL LOW (ref 22–32)
Calcium: 7.3 mg/dL — ABNORMAL LOW (ref 8.9–10.3)
Chloride: 102 mmol/L (ref 98–111)
Creatinine, Ser: 1.05 mg/dL — ABNORMAL HIGH (ref 0.44–1.00)
GFR, Estimated: 57 mL/min — ABNORMAL LOW (ref >60)
Glucose, Bld: 126 mg/dL — ABNORMAL HIGH (ref 70–99)
Potassium: 3.4 mmol/L — ABNORMAL LOW (ref 3.5–5.1)
Sodium: 131 mmol/L — ABNORMAL LOW (ref 135–145)
Total Bilirubin: 0.9 mg/dL (ref 0.3–1.2)
Total Protein: 6.3 g/dL — ABNORMAL LOW (ref 6.5–8.1)

## 2021-01-15 LAB — MAGNESIUM: Magnesium: 1.6 mg/dL — ABNORMAL LOW (ref 1.7–2.4)

## 2021-01-15 LAB — IRON AND TIBC
Iron: 12 ug/dL — ABNORMAL LOW (ref 28–170)
Saturation Ratios: 9 % — ABNORMAL LOW (ref 10.4–31.8)
TIBC: 128 ug/dL — ABNORMAL LOW (ref 250–450)
UIBC: 116 ug/dL

## 2021-01-15 LAB — RESP PANEL BY RT-PCR (FLU A&B, COVID) ARPGX2
Influenza A by PCR: POSITIVE — AB
Influenza B by PCR: NEGATIVE
SARS Coronavirus 2 by RT PCR: NEGATIVE

## 2021-01-15 LAB — FERRITIN: Ferritin: 376 ng/mL — ABNORMAL HIGH (ref 11–307)

## 2021-01-15 LAB — FOLATE: Folate: 10.3 ng/mL (ref >5.9)

## 2021-01-15 LAB — BRAIN NATRIURETIC PEPTIDE: B Natriuretic Peptide: 187 pg/mL — ABNORMAL HIGH (ref 0.0–100.0)

## 2021-01-15 LAB — PROCALCITONIN: Procalcitonin: <0.1 ng/mL

## 2021-01-15 LAB — VITAMIN B12: Vitamin B-12: 176 pg/mL — ABNORMAL LOW (ref 180–914)

## 2021-01-15 LAB — ABO/RH: ABO/RH(D): O POS

## 2021-01-15 LAB — PREPARE RBC (CROSSMATCH)

## 2021-01-15 MED ORDER — POLYETHYLENE GLYCOL 3350 17 G PO PACK: 17.0000 g | PACK | Freq: Every day | ORAL | Status: DC | PRN

## 2021-01-15 MED ORDER — HYDRALAZINE HCL 25 MG PO TABS
100.0000 mg | ORAL_TABLET | Freq: Three times a day (TID) | ORAL | Status: DC
  Administered 2021-01-16 – 2021-01-17 (×2): 100 mg via ORAL
  Filled 2021-01-15 (×3): qty 4

## 2021-01-15 MED ORDER — ACETAMINOPHEN 325 MG PO TABS
650.0000 mg | ORAL_TABLET | Freq: Four times a day (QID) | ORAL | Status: DC | PRN
  Administered 2021-01-16 – 2021-01-18 (×3): 650 mg via ORAL
  Filled 2021-01-15 (×3): qty 2

## 2021-01-15 MED ORDER — AMLODIPINE BESYLATE 5 MG PO TABS
10.0000 mg | ORAL_TABLET | Freq: Every day | ORAL | Status: DC
  Administered 2021-01-17: 10 mg via ORAL
  Filled 2021-01-15 (×2): qty 2

## 2021-01-15 MED ORDER — SACUBITRIL-VALSARTAN 24-26 MG PO TABS
1.0000 | ORAL_TABLET | Freq: Two times a day (BID) | ORAL | Status: DC
  Administered 2021-01-15 – 2021-01-20 (×7): 1 via ORAL
  Filled 2021-01-15 (×8): qty 1

## 2021-01-15 MED ORDER — PANTOPRAZOLE SODIUM 40 MG IV SOLR
40.0000 mg | INTRAVENOUS | Status: DC
  Administered 2021-01-15: 22:00:00 40 mg via INTRAVENOUS
  Filled 2021-01-15 (×3): qty 40

## 2021-01-15 MED ORDER — POTASSIUM CHLORIDE CRYS ER 20 MEQ PO TBCR
40.0000 meq | EXTENDED_RELEASE_TABLET | Freq: Once | ORAL | Status: AC
  Administered 2021-01-15: 22:00:00 40 meq via ORAL
  Filled 2021-01-15: qty 2

## 2021-01-15 MED ORDER — OSELTAMIVIR PHOSPHATE 30 MG PO CAPS
30.0000 mg | ORAL_CAPSULE | Freq: Two times a day (BID) | ORAL | Status: DC
  Administered 2021-01-15 – 2021-01-20 (×9): 30 mg via ORAL
  Filled 2021-01-15 (×11): qty 1

## 2021-01-15 MED ORDER — FUROSEMIDE 10 MG/ML IJ SOLN
20.0000 mg | Freq: Once | INTRAMUSCULAR | Status: AC
  Administered 2021-01-15: 22:00:00 20 mg via INTRAVENOUS
  Filled 2021-01-15: qty 2

## 2021-01-15 MED ORDER — SODIUM CHLORIDE 0.9 % IV SOLN
10.0000 mL/h | Freq: Once | INTRAVENOUS | Status: AC
  Administered 2021-01-15: 19:00:00 10 mL/h via INTRAVENOUS

## 2021-01-15 MED ORDER — CARVEDILOL 12.5 MG PO TABS
12.5000 mg | ORAL_TABLET | Freq: Two times a day (BID) | ORAL | Status: DC
  Administered 2021-01-15 – 2021-01-19 (×6): 12.5 mg via ORAL
  Filled 2021-01-15 (×8): qty 1

## 2021-01-15 MED ORDER — ONDANSETRON HCL 4 MG PO TABS: 4.0000 mg | ORAL_TABLET | Freq: Four times a day (QID) | ORAL | Status: DC | PRN

## 2021-01-15 MED ORDER — ACETAMINOPHEN 650 MG RE SUPP: 650.0000 mg | Freq: Four times a day (QID) | RECTAL | Status: DC | PRN

## 2021-01-15 MED ORDER — MAGNESIUM SULFATE 2 GM/50ML IV SOLN
2.0000 g | Freq: Once | INTRAVENOUS | Status: AC
  Administered 2021-01-15: 22:00:00 2 g via INTRAVENOUS
  Filled 2021-01-15: qty 50

## 2021-01-15 MED ORDER — ATORVASTATIN CALCIUM 40 MG PO TABS
40.0000 mg | ORAL_TABLET | Freq: Every day | ORAL | Status: DC
  Administered 2021-01-16 – 2021-01-19 (×4): 40 mg via ORAL
  Filled 2021-01-15 (×4): qty 1

## 2021-01-15 MED ORDER — SODIUM CHLORIDE 0.9 % IV BOLUS
500.0000 mL | Freq: Once | INTRAVENOUS | Status: AC
  Administered 2021-01-15: 15:00:00 500 mL via INTRAVENOUS

## 2021-01-15 MED ORDER — GUAIFENESIN-DM 100-10 MG/5ML PO SYRP
5.0000 mL | ORAL_SOLUTION | ORAL | Status: DC | PRN
  Administered 2021-01-15 – 2021-01-20 (×9): 5 mL via ORAL
  Filled 2021-01-15 (×10): qty 5

## 2021-01-15 MED ORDER — ONDANSETRON HCL 4 MG/2ML IJ SOLN: 4.0000 mg | Freq: Four times a day (QID) | INTRAMUSCULAR | Status: DC | PRN

## 2021-01-15 MED ORDER — SPIRONOLACTONE 25 MG PO TABS
25.0000 mg | ORAL_TABLET | Freq: Every day | ORAL | Status: DC
  Administered 2021-01-16 – 2021-01-17 (×2): 25 mg via ORAL
  Filled 2021-01-15 (×2): qty 1

## 2021-01-15 MED ORDER — VITAMIN B-12 100 MCG PO TABS
500.0000 ug | ORAL_TABLET | Freq: Every day | ORAL | Status: DC
Start: 2021-01-15 — End: 2021-01-17
  Administered 2021-01-15 – 2021-01-17 (×3): 500 ug via ORAL
  Filled 2021-01-15 (×3): qty 5

## 2021-01-15 MED ORDER — FUROSEMIDE 20 MG PO TABS
20.0000 mg | ORAL_TABLET | Freq: Every day | ORAL | Status: DC
  Administered 2021-01-16 – 2021-01-20 (×4): 20 mg via ORAL
  Filled 2021-01-15 (×5): qty 1

## 2021-01-15 NOTE — ED Provider Notes (Signed)
[facility] EMERGENCY DEPARTMENT Provider Note   CSN: [id number] Arrival date & time: 01/15/21  1338     History Chief Complaint  Patient presents with   Near Syncope   Abnormal Lab    Mackenzie [name] is a 70 y.o. female.  HPI 70 year old female presents with syncope and anemia.  History is mostly from daughter at bedside.  She was getting blood drawn at the doctor's office as she was getting evaluated for cough and congestion and passed out.  They were having a hard time finding a vein and stuck her multiple times.  She notes progressive dizziness for a couple minutes and then passed out.  Patient denies any current or preceding headache, chest pain, shortness of breath.  She endorses congestion and cough and subjective fevers and cold sensation for about a week.  She denies any obvious source of bleeding but was told her hemoglobin came back at 6.9 so she was told to come to the hospital.  She passed out about 11 days ago went to [place] but no obvious cause was found and she was discharged.  At that point she had been walking and felt lightheaded and passed out.  Daughter is also concerned she is dehydrated from poor p.o. intake.  Past Medical History:  Diagnosis Date   Arthritis    CHF (congestive heart failure) ([place])    Hypertension     Patient Active Problem List   Diagnosis Date Noted   Acute anemia 01/15/2021   Syncope 01/15/2021   Influenza A 01/15/2021   Atrial fibrillation ([place]) 01/15/2021   Pneumonia due to influenza 58/52/7782   Chronic systolic heart failure ([place]) 05/14/2016   HTN (hypertension) 05/14/2016   Snoring 05/14/2016    Past Surgical History:  Procedure Laterality Date   LEFT HEART CATH AND CORONARY ANGIOGRAPHY Right 05/06/2016   Procedure: Left Heart Cath and Coronary Angiography;  Surgeon: [name], MD;  Location: [place] [facility];  Service: Cardiovascular;  Laterality: Right;     OB History   No obstetric history on file.      Family History  Problem Relation Age of Onset   Diabetes Mother    Heart disease Mother    Heart attack Father    Diabetes Brother    Breast cancer Maternal Aunt    Breast cancer Maternal Grandmother     Social History   Tobacco Use   Smoking status: Never   Smokeless tobacco: Never  Vaping Use   Vaping Use: Never used  Substance Use Topics   Alcohol use: No   Drug use: No    Home Medications Prior to Admission medications   Medication Sig Start Date End Date Taking? Authorizing Provider  albuterol (PROVENTIL HFA;VENTOLIN HFA) 108 (90 Base) MCG/ACT inhaler Inhale 2 puffs into the lungs daily as needed for wheezing or shortness of breath. 06/11/16  Yes Hackney, Otila Kluver A, FNP  amLODipine (NORVASC) 10 MG tablet Take 10 mg by mouth daily. 09/20/20  Yes [provider]  apixaban (ELIQUIS) 5 MG TABS tablet Take 1 tablet (5 mg total) by mouth 2 (two) times daily. 06/11/16  Yes Darylene Price A, FNP  aspirin EC 81 MG tablet Take 81 mg by mouth daily.   Yes [provider]  atorvastatin (LIPITOR) 40 MG tablet Take 1 tablet (40 mg total) by mouth daily at 6 PM. 06/11/16  Yes Darylene Price A, FNP  carvedilol (COREG) 12.5 MG tablet Take 1 tablet (12.5 mg total) by  mouth 2 (two) times daily with a meal. 06/11/16  Yes Hackney, Tina A, FNP  hydrALAZINE (APRESOLINE) 100 MG tablet Take 100 mg by mouth 3 (three) times daily. 08/10/20  Yes [provider]  sacubitril-valsartan (ENTRESTO) 24-26 MG Take 1 tablet by mouth 2 (two) times daily. 05/13/16  Yes Darylene Price A, FNP  spironolactone (ALDACTONE) 25 MG tablet Take 1 tablet (25 mg total) by mouth daily. 06/11/16  Yes Darylene Price A, FNP  atorvastatin (LIPITOR) 40 MG tablet 1 tablet Patient not taking: Reported on 01/15/2021    [provider]  diclofenac sodium (VOLTAREN) 1 % GEL Apply 2 g topically 3 (three) times daily as needed (pain). Patient not taking: Reported on 01/14/2018 08/29/16   Merlyn Lot, MD   furosemide (LASIX) 20 MG tablet Take 1 tablet (20 mg total) by mouth daily. Patient not taking: Reported on 01/15/2021 06/11/16   Alisa Graff, FNP  gabapentin (NEURONTIN) 100 MG capsule Take 1 capsule (100 mg total) by mouth 2 (two) times daily. Patient not taking: Reported on 01/15/2021 08/29/16 09/10/16  Merlyn Lot, MD  metFORMIN (GLUCOPHAGE) 500 MG tablet Take by mouth. Patient not taking: Reported on 01/15/2021    [provider]  metoprolol tartrate (LOPRESSOR) 25 MG tablet Take by mouth. Patient not taking: Reported on 01/15/2021 07/07/14   [provider]    Allergies    Patient has no known allergies.  Review of Systems   Review of Systems  Constitutional:  Positive for fever.  HENT:  Negative for nosebleeds.   Respiratory:  Negative for shortness of breath.   Cardiovascular:  Negative for chest pain.  Gastrointestinal:  Negative for abdominal pain and blood in stool.  Neurological:  Positive for syncope and light-headedness. Negative for headaches.  All other systems reviewed and are negative.  Physical Exam Updated Vital Signs BP 106/85   Pulse 65   Temp 98.3 F (36.8 C) (Oral)   Resp 18   Ht 5\' 6"  (1.676 m)   Wt 90.7 kg   SpO2 95%   BMI 32.28 kg/m   Physical Exam Vitals and nursing note reviewed. Exam conducted with a chaperone present.  Constitutional:      Appearance: She is well-developed.  HENT:     Head: Normocephalic and atraumatic.     Right Ear: External ear normal.     Left Ear: External ear normal.     Nose: Nose normal.  Eyes:     General:        Right eye: No discharge.        Left eye: No discharge.     Extraocular Movements: Extraocular movements intact.     Pupils: Pupils are equal, round, and reactive to light.  Cardiovascular:     Rate and Rhythm: Normal rate and regular rhythm.     Heart sounds: Murmur heard.  Pulmonary:     Effort: Pulmonary effort is normal.     Breath sounds: Normal breath sounds. No  wheezing, rhonchi or rales.  Abdominal:     General: There is no distension.     Palpations: Abdomen is soft.     Tenderness: There is no abdominal tenderness.  Genitourinary:    Rectum: Guaiac result positive (brown stool that was guaiac positive). No external hemorrhoid or internal hemorrhoid.  Skin:    General: Skin is warm and dry.  Neurological:     Mental Status: She is alert.     Comments: CN 3-12 grossly intact. 5/5 strength in  all 4 extremities. Grossly normal sensation. Normal finger to nose.   Psychiatric:        Mood and Affect: Mood is not anxious.    ED Results / Procedures / Treatments   Labs (all labs ordered are listed, but only abnormal results are displayed) Labs Reviewed  RESP PANEL BY RT-PCR (FLU A&B, COVID) ARPGX2 - Abnormal; Notable for the following components:      Result Value   Influenza A by PCR POSITIVE (*)    All other components within normal limits  CBC WITH DIFFERENTIAL/PLATELET - Abnormal; Notable for the following components:   WBC 2.7 (*)    RBC 2.48 (*)    Hemoglobin 7.0 (*)    HCT 21.7 (*)    All other components within normal limits  COMPREHENSIVE METABOLIC PANEL - Abnormal; Notable for the following components:   Sodium 131 (*)    Potassium 3.4 (*)    CO2 20 (*)    Glucose, Bld 126 (*)    Creatinine, Ser 1.05 (*)    Calcium 7.3 (*)    Total Protein 6.3 (*)    Albumin 2.4 (*)    Alkaline Phosphatase 37 (*)    GFR, Estimated 57 (*)    All other components within normal limits  VITAMIN B12 - Abnormal; Notable for the following components:   Vitamin B-12 176 (*)    All other components within normal limits  IRON AND TIBC - Abnormal; Notable for the following components:   Iron 12 (*)    TIBC 128 (*)    Saturation Ratios 9 (*)    All other components within normal limits  FERRITIN - Abnormal; Notable for the following components:   Ferritin 376 (*)    All other components within normal limits  RETICULOCYTES - Abnormal; Notable  for the following components:   RBC. 2.43 (*)    Immature Retic Fract 19.3 (*)    All other components within normal limits  BRAIN NATRIURETIC PEPTIDE - Abnormal; Notable for the following components:   B Natriuretic Peptide 187.0 (*)    All other components within normal limits  MAGNESIUM - Abnormal; Notable for the following components:   Magnesium 1.6 (*)    All other components within normal limits  HEMOGLOBIN AND HEMATOCRIT, BLOOD - Abnormal; Notable for the following components:   Hemoglobin 7.7 (*)    HCT 23.3 (*)    All other components within normal limits  CBC - Abnormal; Notable for the following components:   WBC 1.9 (*)    RBC 2.70 (*)    Hemoglobin 7.7 (*)    HCT 23.4 (*)    All other components within normal limits  POC OCCULT BLOOD, ED - Abnormal  FOLATE  PROCALCITONIN  HIV ANTIBODY (ROUTINE TESTING W REFLEX)  HEMOGLOBIN A1C  TYPE AND SCREEN  ABO/RH  PREPARE RBC (CROSSMATCH)    EKG EKG Interpretation  Date/Time:  Monday January 15 2021 13:52:30 EST Ventricular Rate:  82 PR Interval:    QRS Duration: 144 QT Interval:  420 QTC Calculation: 491 R Axis:   -61 Text Interpretation: Atrial fibrillation Left bundle branch block Baseline wander in lead(s) II III aVF Confirmed by Sherwood Gambler [phone]) on 01/15/2021 2:59:29 PM  Radiology DG Chest Port 1 View  Result Date: 01/15/2021 CLINICAL DATA:  Near syncopal episode in flu like symptoms. Hemoglobin of 6.9. History of CHF with cough for 1 week. EXAM: PORTABLE CHEST 1 VIEW COMPARISON:  March of 2018. FINDINGS: EKG leads project  over the chest. Cardiac enlargement and fullness of the RIGHT and LEFT hilum is a stable finding based on comparison with previous imaging. There is no lobar consolidative process. No sign of pleural effusion on frontal radiograph or pneumothorax. Subtle opacities scattered throughout the upper and mid chest bilaterally suggested on the current study. On limited assessment there is no  acute skeletal process. IMPRESSION: Cardiomegaly with central pulmonary vascular engorgement. Subtle opacities scattered throughout the upper and mid chest bilaterally, findings could represent asymmetric edema versus is atypical infection, consider follow-up PA and lateral chest radiograph when the patient is able to ensure resolution. Electronically Signed   By: Zetta Bills M.D.   On: 01/15/2021 15:35    Procedures .Critical Care Performed by: Sherwood Gambler, MD Authorized by: Sherwood Gambler, MD   Critical care provider statement:    Critical care time (minutes):  30   Critical care time was exclusive of:  Separately billable procedures and treating other patients   Critical care was necessary to treat or prevent imminent or life-threatening deterioration of the following conditions:  Circulatory failure   Critical care was time spent personally by me on the following activities:  Development of treatment plan with patient or surrogate, discussions with consultants, evaluation of patient's response to treatment, examination of patient, ordering and review of laboratory studies, ordering and review of radiographic studies, ordering and performing treatments and interventions, pulse oximetry, re-evaluation of patient's condition and review of old charts   Medications Ordered in ED Medications  ondansetron (ZOFRAN) tablet 4 mg (has no administration in time range)    Or  ondansetron (ZOFRAN) injection 4 mg (has no administration in time range)  acetaminophen (TYLENOL) tablet 650 mg (has no administration in time range)    Or  acetaminophen (TYLENOL) suppository 650 mg (has no administration in time range)  polyethylene glycol (MIRALAX / GLYCOLAX) packet 17 g (has no administration in time range)  oseltamivir (TAMIFLU) capsule 30 mg (30 mg Oral Given 01/15/21 2100)  amLODipine (NORVASC) tablet 10 mg (has no administration in time range)  atorvastatin (LIPITOR) tablet 40 mg (has no  administration in time range)  carvedilol (COREG) tablet 12.5 mg (12.5 mg Oral Given 01/15/21 1857)  hydrALAZINE (APRESOLINE) tablet 100 mg (has no administration in time range)  sacubitril-valsartan (ENTRESTO) 24-26 mg per tablet (1 tablet Oral Given 01/15/21 2100)  spironolactone (ALDACTONE) tablet 25 mg (has no administration in time range)  furosemide (LASIX) tablet 20 mg (has no administration in time range)  pantoprazole (PROTONIX) injection 40 mg (40 mg Intravenous Given 01/15/21 2158)  vitamin B-12 (CYANOCOBALAMIN) tablet 500 mcg (500 mcg Oral Given 01/15/21 1857)  guaiFENesin-dextromethorphan (ROBITUSSIN DM) 100-10 MG/5ML syrup 5 mL (5 mLs Oral Given 01/15/21 2100)  sodium chloride 0.9 % bolus 500 mL (0 mLs Intravenous Stopped 01/15/21 1623)  0.9 %  sodium chloride infusion (10 mL/hr Intravenous New Bag/Given 01/15/21 1849)  potassium chloride SA (KLOR-CON) CR tablet 40 mEq (40 mEq Oral Given 01/15/21 2158)  magnesium sulfate IVPB 2 g 50 mL (2 g Intravenous New Bag/Given 01/15/21 2206)  furosemide (LASIX) injection 20 mg (20 mg Intravenous Given 01/15/21 2158)    ED Course  I have reviewed the triage vital signs and the nursing notes.  Pertinent labs & imaging results that were available during my care of the patient were reviewed by me and considered in my medical decision making (see chart for details).    MDM Rules/Calculators/A&P  Patient's syncope sounds like vasovagal. However she is acute on chronically anemic. Given she's right at 7.0 will give unit of blood. She is also influenza positive, though has been symptomatic about 1 week. I suspect the chest xray findings are related to flu. Will admit for further workup. Occult blood test is positive.  Final Clinical Impression(s) / ED Diagnoses Final diagnoses:  Symptomatic anemia    Rx / DC Orders ED Discharge Orders          Ordered    POCT occult blood stool       Comments: This order was  created through External Result Entry    01/15/21 1656             Sherwood Gambler, MD 01/16/21 [phone]

## 2021-01-15 NOTE — ED Notes (Signed)
Hemoccult POSITIVE 

## 2021-01-15 NOTE — H&P (Addendum)
History and Physical    Mackenzie [name] PJK:[id number] DOB: 02/09/1951 DOA: 01/15/2021  PCP: [facility] Dept Personal   Patient coming from: Home  I have personally briefly reviewed patient's old medical records in [place]  Chief Complaint: Syncope  HPI: Mackenzie [name] is a 70 y.o. female with medical history significant for systolic CHF, hypertension.  Patient was worsening from outpatient providers office-[facility] reports that patient passed out while attempting to draw blood, after multiple fingersticks.  Patient reports she felt dizzy and this may be related to the multiple fingersticks.  She reports she has had a few occasions of dizziness during blood draws.  She denies difficulty breathing chest pains. She reports cough, congestion with dizziness and fevers over the past 4 days.  She denies black stools, no bloody stools, she reports 1 episode of vomiting today at her providers office which was without blood or coffee-grounds.  Denies NSAID use.  She is on Eliquis and lasix and reports compliance.  Patient reports about 2 weeks ago on the 15th, she was at home, walking around in the kitchen when she felt dizzy, she was trying to get to her bedroom, and her daughter said she passed out in the bedroom on the bed.  She did not hit her head.  She was evaluated  [place] ED, patient had waited 9 hours to be seen, without any subsequent events, blood work was [employer] patient requested to be discharged.  Event was thought to be vasovagal in etiology.  Blood work today reviewed hemoglobin of 6.9, patient was the ED by her provider.  ED Course: T-max 99.2.  Heart rate 80s.  Respiratory rate 15-30.  Blood pressure systolic 80-998.  O2 sats greater than 98% on room air.  Hemoglobin 7.  WBC 2.7.  Chest x-ray showed cardiomegaly with central pulmonary vascular engorgement.  Also subtle opacities throughout the upper and mid chest bilaterally  could represent asymmetric edema versus atypical infection. -1 u PRBC ordered for transfusion.  Hospitalist to admit.  Review of Systems: As per HPI all other systems reviewed and negative.  Past Medical History:  Diagnosis Date   Arthritis    CHF (congestive heart failure) ([place])    Hypertension     Past Surgical History:  Procedure Laterality Date   LEFT HEART CATH AND CORONARY ANGIOGRAPHY Right 05/06/2016   Procedure: Left Heart Cath and Coronary Angiography;  Surgeon: [name], MD;  Location: [place] [facility];  Service: Cardiovascular;  Laterality: Right;     reports that she has never smoked. She has never used smokeless tobacco. She reports that she does not drink alcohol and does not use drugs.  No Known Allergies  Family History  Problem Relation Age of Onset   Diabetes Mother    Heart disease Mother    Heart attack Father    Diabetes Brother    Breast cancer Maternal Aunt    Breast cancer Maternal Grandmother     Prior to Admission medications   Medication Sig Start Date End Date Taking? Authorizing Provider  albuterol (PROVENTIL HFA;VENTOLIN HFA) 108 (90 Base) MCG/ACT inhaler Inhale 2 puffs into the lungs daily as needed for wheezing or shortness of breath. 06/11/16   Alisa Graff, FNP  apixaban (ELIQUIS) 5 MG TABS tablet Take 1 tablet (5 mg total) by mouth 2 (two) times daily. 06/11/16   Alisa Graff, FNP  aspirin EC 81 MG tablet Take 81 mg by mouth daily.  [provider]  atorvastatin (LIPITOR) 40 MG tablet Take 1 tablet (40 mg total) by mouth daily at 6 PM. 06/11/16   Alisa Graff, FNP  carvedilol (COREG) 12.5 MG tablet Take 1 tablet (12.5 mg total) by mouth 2 (two) times daily with a meal. 06/11/16   Darylene Price A, FNP  diclofenac sodium (VOLTAREN) 1 % GEL Apply 2 g topically 3 (three) times daily as needed (pain). Patient not taking: Reported on 01/14/2018 08/29/16   Merlyn Lot, MD  furosemide (LASIX) 20 MG tablet Take 1 tablet  (20 mg total) by mouth daily. 06/11/16   Alisa Graff, FNP  gabapentin (NEURONTIN) 100 MG capsule Take 1 capsule (100 mg total) by mouth 2 (two) times daily. 08/29/16 09/10/16  Merlyn Lot, MD  sacubitril-valsartan (ENTRESTO) 24-26 MG Take 1 tablet by mouth 2 (two) times daily. 05/13/16   Alisa Graff, FNP  spironolactone (ALDACTONE) 25 MG tablet Take 1 tablet (25 mg total) by mouth daily. 06/11/16   Alisa Graff, FNP    Physical Exam: Vitals:   01/15/21 1530 01/15/21 1600 01/15/21 1615 01/15/21 1630  BP: 99/80 (!) 129/59  (!) 123/54  Pulse: 80  81 84  Resp: (!) 23 19 (!) 28 (!) 29  Temp:      TempSrc:      SpO2: 98%  98% 99%  Weight:      Height:        Constitutional: NAD, calm, comfortable Vitals:   01/15/21 1530 01/15/21 1600 01/15/21 1615 01/15/21 1630  BP: 99/80 (!) 129/59  (!) 123/54  Pulse: 80  81 84  Resp: (!) 23 19 (!) 28 (!) 29  Temp:      TempSrc:      SpO2: 98%  98% 99%  Weight:      Height:       Eyes:  Pupils equal, lids and conjunctivae normal ENMT: Mucous membranes are moist.  Neck: normal, supple, no masses, no thyromegaly Respiratory: clear to auscultation bilaterally, no wheezing, no crackles. Normal respiratory effort. No accessory muscle use.  Cardiovascular: Regular rate and rhythm, 3/6 systolic murmurs /no rubs / gallops. No extremity edema. 2+ pedal pulses.   Abdomen: no tenderness, no masses palpated. No hepatosplenomegaly. Bowel sounds positive.  Musculoskeletal: no clubbing / cyanosis. No joint deformity upper and lower extremities.  Skin: no rashes, lesions, ulcers. No induration Neurologic: No apparent cranial nerve abnormality, moving extremities spontaneously. Psychiatric: Normal judgment and insight. Alert and oriented x 3. Normal mood.   Labs on Admission: I have personally reviewed following labs and imaging studies  CBC: Recent Labs  Lab 01/15/21 1458  WBC 2.7*  NEUTROABS 1.8  HGB 7.0*  HCT 21.7*  MCV 87.5  PLT 127    Basic Metabolic Panel: Recent Labs  Lab 01/15/21 1458  NA 131*  K 3.4*  CL 102  CO2 20*  GLUCOSE 126*  BUN 18  CREATININE 1.05*  CALCIUM 7.3*   GFR: Estimated Creatinine Clearance: 56.6 mL/min (A) (by C-G formula based on SCr of 1.05 mg/dL (H)). Liver Function Tests: Recent Labs  Lab 01/15/21 1458  AST 17  ALT 10  ALKPHOS 37*  BILITOT 0.9  PROT 6.3*  ALBUMIN 2.4*   Anemia Panel: Recent Labs    01/15/21 1458  VITAMINB12 176*  FOLATE 10.3  FERRITIN 376*  TIBC 128*  IRON 12*  RETICCTPCT 1.8   Urine analysis:    Component Value Date/Time   COLORURINE YELLOW (A) 01/02/2021 2219   APPEARANCEUR  HAZY (A) 01/02/2021 2219   LABSPEC 1.019 01/02/2021 2219   PHURINE 5.0 01/02/2021 2219   GLUCOSEU NEGATIVE 01/02/2021 2219   HGBUR NEGATIVE 01/02/2021 2219   BILIRUBINUR NEGATIVE 01/02/2021 2219   KETONESUR 5 (A) 01/02/2021 2219   PROTEINUR 30 (A) 01/02/2021 2219   NITRITE NEGATIVE 01/02/2021 2219   LEUKOCYTESUR TRACE (A) 01/02/2021 2219    Radiological Exams on Admission: DG Chest Port 1 View  Result Date: 01/15/2021 CLINICAL DATA:  Near syncopal episode in flu like symptoms. Hemoglobin of 6.9. History of CHF with cough for 1 week. EXAM: PORTABLE CHEST 1 VIEW COMPARISON:  March of 2018. FINDINGS: EKG leads project over the chest. Cardiac enlargement and fullness of the RIGHT and LEFT hilum is a stable finding based on comparison with previous imaging. There is no lobar consolidative process. No sign of pleural effusion on frontal radiograph or pneumothorax. Subtle opacities scattered throughout the upper and mid chest bilaterally suggested on the current study. On limited assessment there is no acute skeletal process. IMPRESSION: Cardiomegaly with central pulmonary vascular engorgement. Subtle opacities scattered throughout the upper and mid chest bilaterally, findings could represent asymmetric edema versus is atypical infection, consider follow-up PA and lateral chest  radiograph when the patient is able to ensure resolution. Electronically Signed   By: Zetta Bills M.D.   On: 01/15/2021 15:35    EKG: Independently reviewed.  Rhythm is irregular, P waves are present in some leads II, III, aVF and V3.  Rate 82.  QTc 491.  Assessment/Plan Principal Problem:   Acute anemia Active Problems:   Syncope   Pneumonia due to influenza   Influenza A   Chronic systolic heart failure (HCC)   HTN (hypertension)   Atrial fibrillation (HCC)   Acute anemia- hgb 7, was down to ~9  13 days ago.  Last check prior to that was 3 years ago, hemoglobin was 13.  On chronic anticoagulation with Eliquis.  Denies melena hematochezia or hematemesis.  Denies NSAID use. -IV Protonix 40 daily -Transfuse 1 unit PRBC - IV lasix 20mg  x 1 with transfusion -Check CBC - GI consult -hold Eliquis -N.p.o. midnight -Anemia panel obtained prior to transfusion in ED suggest anemia of chronic disease with elevated ferritin greater than 76, reduced TIBC 128, reduced serum iron of 12, and reduce iron saturation of 9, and mild B12 deficiency at 176.   Influenza A infection with Pneumonia-reports symptoms-cough, congestion of 4 days.  O2 sats 98% on room air.  Chest x-ray suggest atypical infection versus asymmetric edema involving upper and mid lungs.   -Start Tamiflu -Obtain procalcitonin -500 mill bolus given in ED.  Syncope -today's event likely multifactorial in the setting of acute anemia and influenza infection, and multiple blood draw attempts.  Negative orthostatic vitals. -Obtain echo, considering history of systolic CHF may warrant cardiology input, as this is patient's second syncopal episode in the past 2 weeks.  Leukopenia-2.7. -Trend for now  Prolonged QTC-491.  Potassium 3.4.  Magnesium 1.6. - replete lytes  Chronic systolic heart failure-appears euvolemic.  Chest x-ray suggesting pulmonary vascular engorgement.  Peripherally no evidence of volume overload.  And BNP is  stable at 187 compared to prior.  Last echo 2018 EF 35 to 40% with grade 1 diastolic dysfunction. - IV lasix 20mg  x 1, then resume home Lasix 20 mg daily - patient reports compliance to me, but MAR states otherwise. -Resume Entresto, carvedilol, spironolactone, hydralazine  Atrial fibrillation on chronic anticoagulation-rates 80s.  On Eliquis reports compliance. - Resume  carvedilol -Hold Eliquis  HTN - stable -Resume carvedilol, spironolactone, Lasix, hydralazine  ?? DM-random glucose 126.  -  Fasting CBGs - Hgba1c  DVT prophylaxis: SCDS Code Status: Full code Family Communication: None at bedside Disposition Plan:  ~2 days Consults called:  None Admission status: inpt, tele I certify that at the point of admission it is my clinical judgment that the patient will require inpatient hospital care spanning beyond 2 midnights from the point of admission due to high intensity of service, high risk for further deterioration and high frequency of surveillance required.   Bethena Roys MD [facility]  01/15/2021, 6:19 PM

## 2021-01-15 NOTE — ED Triage Notes (Signed)
Sent by [facility] after near syncopal and flu like symptoms.  Did blood work and hgb 6.9.  Hx of CHF

## 2021-01-15 NOTE — ED Notes (Signed)
2 unsuccessful IV attempts.

## 2021-01-16 ENCOUNTER — Encounter (HOSPITAL_COMMUNITY): Payer: Self-pay | Admitting: Internal Medicine

## 2021-01-16 ENCOUNTER — Inpatient Hospital Stay (HOSPITAL_COMMUNITY): Payer: Medicare Other

## 2021-01-16 DIAGNOSIS — R195 Other fecal abnormalities: Secondary | ICD-10-CM

## 2021-01-16 DIAGNOSIS — R55 Syncope and collapse: Secondary | ICD-10-CM | POA: Diagnosis not present

## 2021-01-16 DIAGNOSIS — I5022 Chronic systolic (congestive) heart failure: Secondary | ICD-10-CM

## 2021-01-16 DIAGNOSIS — I10 Essential (primary) hypertension: Secondary | ICD-10-CM

## 2021-01-16 DIAGNOSIS — J1100 Influenza due to unidentified influenza virus with unspecified type of pneumonia: Secondary | ICD-10-CM

## 2021-01-16 DIAGNOSIS — J101 Influenza due to other identified influenza virus with other respiratory manifestations: Secondary | ICD-10-CM | POA: Diagnosis not present

## 2021-01-16 DIAGNOSIS — D649 Anemia, unspecified: Secondary | ICD-10-CM

## 2021-01-16 LAB — COMPREHENSIVE METABOLIC PANEL
ALT: 10 U/L (ref 0–44)
AST: 17 U/L (ref 15–41)
Albumin: 2.2 g/dL — ABNORMAL LOW (ref 3.5–5.0)
Alkaline Phosphatase: 36 U/L — ABNORMAL LOW (ref 38–126)
Anion gap: 5 (ref 5–15)
BUN: 18 mg/dL (ref 8–23)
CO2: 21 mmol/L — ABNORMAL LOW (ref 22–32)
Calcium: 7.4 mg/dL — ABNORMAL LOW (ref 8.9–10.3)
Chloride: 105 mmol/L (ref 98–111)
Creatinine, Ser: 1.13 mg/dL — ABNORMAL HIGH (ref 0.44–1.00)
GFR, Estimated: 52 mL/min — ABNORMAL LOW (ref >60)
Glucose, Bld: 163 mg/dL — ABNORMAL HIGH (ref 70–99)
Potassium: 3.9 mmol/L (ref 3.5–5.1)
Sodium: 131 mmol/L — ABNORMAL LOW (ref 135–145)
Total Bilirubin: 0.7 mg/dL (ref 0.3–1.2)
Total Protein: 5.9 g/dL — ABNORMAL LOW (ref 6.5–8.1)

## 2021-01-16 LAB — GLUCOSE, CAPILLARY
Glucose-Capillary: 91 mg/dL (ref 70–99)
Glucose-Capillary: 124 mg/dL — ABNORMAL HIGH (ref 70–99)
Glucose-Capillary: 94 mg/dL (ref 70–99)

## 2021-01-16 LAB — ECHOCARDIOGRAM COMPLETE
AR max vel: 1.58 cm2
AV Area VTI: 1.57 cm2
AV Area mean vel: 1.59 cm2
AV Mean grad: 7 mmHg
AV Peak grad: 12.8 mmHg
Ao pk vel: 1.79 m/s
Area-P 1/2: 2.81 cm2
Calc EF: 63.8 %
Height: 66 in
MV VTI: 1.97 cm2
S' Lateral: 3.2 cm
Single Plane A2C EF: 71.6 %
Single Plane A4C EF: 54.9 %
Weight: 3200 oz

## 2021-01-16 LAB — CBC
HCT: 23.4 % — ABNORMAL LOW (ref 36.0–46.0)
HCT: 23.9 % — ABNORMAL LOW (ref 36.0–46.0)
Hemoglobin: 7.6 g/dL — ABNORMAL LOW (ref 12.0–15.0)
Hemoglobin: 7.7 g/dL — ABNORMAL LOW (ref 12.0–15.0)
MCH: 27.6 pg (ref 26.0–34.0)
MCH: 28.5 pg (ref 26.0–34.0)
MCHC: 31.8 g/dL (ref 30.0–36.0)
MCHC: 32.9 g/dL (ref 30.0–36.0)
MCV: 86.7 fL (ref 80.0–100.0)
MCV: 86.9 fL (ref 80.0–100.0)
Platelets: 213 10*3/uL (ref 150–400)
Platelets: 219 10*3/uL (ref 150–400)
RBC: 2.7 MIL/uL — ABNORMAL LOW (ref 3.87–5.11)
RBC: 2.75 MIL/uL — ABNORMAL LOW (ref 3.87–5.11)
RDW: 14.1 % (ref 11.5–15.5)
RDW: 14.2 % (ref 11.5–15.5)
WBC: 1.9 10*3/uL — ABNORMAL LOW (ref 4.0–10.5)
WBC: 2.5 10*3/uL — ABNORMAL LOW (ref 4.0–10.5)
nRBC: 0 % (ref 0.0–0.2)
nRBC: 0 % (ref 0.0–0.2)

## 2021-01-16 LAB — HEMOGLOBIN A1C
Hgb A1c MFr Bld: 5.8 % — ABNORMAL HIGH (ref 4.8–5.6)
Mean Plasma Glucose: 120 mg/dL

## 2021-01-16 LAB — HIV ANTIBODY (ROUTINE TESTING W REFLEX): HIV Screen 4th Generation wRfx: NONREACTIVE

## 2021-01-16 LAB — HEMOGLOBIN AND HEMATOCRIT, BLOOD
HCT: 23.3 % — ABNORMAL LOW (ref 36.0–46.0)
Hemoglobin: 7.7 g/dL — ABNORMAL LOW (ref 12.0–15.0)

## 2021-01-16 LAB — PHOSPHORUS: Phosphorus: 2 mg/dL — ABNORMAL LOW (ref 2.5–4.6)

## 2021-01-16 LAB — PREPARE RBC (CROSSMATCH)

## 2021-01-16 MED ORDER — INSULIN ASPART 100 UNIT/ML IJ SOLN
0.0000 [IU] | Freq: Three times a day (TID) | INTRAMUSCULAR | Status: DC
Start: 2021-01-16 — End: 2021-01-20
  Administered 2021-01-16: 1 [IU] via SUBCUTANEOUS

## 2021-01-16 MED ORDER — CYANOCOBALAMIN 1000 MCG/ML IJ SOLN
INTRAMUSCULAR | Status: AC
  Filled 2021-01-16: qty 1

## 2021-01-16 MED ORDER — PANTOPRAZOLE SODIUM 40 MG IV SOLR
40.0000 mg | Freq: Two times a day (BID) | INTRAVENOUS | Status: DC
  Administered 2021-01-16 – 2021-01-20 (×7): 40 mg via INTRAVENOUS
  Filled 2021-01-16 (×6): qty 40

## 2021-01-16 MED ORDER — MAGNESIUM SULFATE 4 GM/100ML IV SOLN
4.0000 g | Freq: Once | INTRAVENOUS | Status: AC
  Administered 2021-01-16: 4 g via INTRAVENOUS

## 2021-01-16 MED ORDER — SODIUM CHLORIDE 0.9% IV SOLUTION: Freq: Once | INTRAVENOUS | Status: AC

## 2021-01-16 MED ORDER — MAGNESIUM SULFATE 4 GM/100ML IV SOLN
INTRAVENOUS | Status: AC
  Filled 2021-01-16: qty 100

## 2021-01-16 MED ORDER — CYANOCOBALAMIN 1000 MCG/ML IJ SOLN
1000.0000 ug | Freq: Every day | INTRAMUSCULAR | Status: DC
  Administered 2021-01-16 – 2021-01-17 (×2): 1000 ug via INTRAMUSCULAR
  Filled 2021-01-16: qty 1

## 2021-01-16 NOTE — Progress Notes (Signed)
*  PRELIMINARY RESULTS* Echocardiogram 2D Echocardiogram has been performed.  Mackenzie [name] 01/16/2021, 1:58 PM

## 2021-01-16 NOTE — Consult Note (Addendum)
@LOGO @   Referring Provider: [facility] hospitalist Primary Care Physician:  [facility] Personal Primary Gastroenterologist:  Dr. Jenetta Downer (previously unassigned)  Date of Admission: 01/15/2021 Date of Consultation: 01/16/2021  Reason for Consultation: Anemia, iron deficiency, on Eliquis.  HPI:  Mackenzie [name] is a 70 y.o. year old female with history of HTN, HLD, chronic heart failure, atrial fibrillation on Eliquis, who presented to the emergency room from [facility] due to low hemoglobin.  She was being evaluated for cough, congestion, and fever for about 1 week, and passed out while having a blood draw, blood work revealed hemoglobin of 6.9.  She also reported history of syncope about 2 weeks prior s/p evaluation at [facility] ED, suspected to be vasovagal in etiology at that time.   ED course: T-max 99.2.  Heart rate 80s.  Respiratory rate 15-30.  Blood pressure systolic 40-981.  O2 sats greater than 98% on room air.  Hemoglobin 7.  WBC 2.7.  Chest x-ray showed cardiomegaly with central pulmonary vascular engorgement.  Also subtle opacities throughout the upper and mid chest bilaterally could represent asymmetric edema versus atypical infection.   She was ordered 1 unit PRBCs and hospitalist planned to admit.   Respiratory panel later returned positive for influenza A. She was started on Tamiflu.   Today:  Denies SOB. Mild cough. No CP. Feeling somewhat improved since yesterday. Had been sick for about 1 week and 2 days per daughter prior to seeing PCP yesterday.  Patient is a very difficult historian.  She reports she did have a black stool yesterday x 1. May have seen some last week.  Later reports, she may have been having intermittent black stools for a few weeks.  Can't be sure. No iron or pepto bismol. No bright red blood in the stool. No vaginal bleeding or blood in the urine. Denies abdominal pain, constipation, diarrhea, GERD, dysphagia.     Daughter reports she has lost about 50 lbs over the last 2-3 years. Thinks she has lost weight over the last 6 months, can't quantify. Lack of appetite since being sick over the last 1-2 weeks. Prior to this, she was eating well.   Denies dizziness/lightheadedness. Reports prior to syncopal episodes she would see stars. Daughter reports she had some shaking when she passed out. No prior issues with syncope until recently.   [facility].  Doesn't think she had polyps. No prior EGD.   Eliquis BID: Last dose morning on 11/28.  NSAIDs: None aside from aspirin.  Alcohol: None Tobacco: None  April Slotnick (Daughter)at bedside. Asks that we update her with procedure plans if she is not here when the final decision is made: [id number]   Past Medical History:  Diagnosis Date   Arthritis    CHF (congestive heart failure) ([place])    Hypertension     Past Surgical History:  Procedure Laterality Date   LEFT HEART CATH AND CORONARY ANGIOGRAPHY Right 05/06/2016   Procedure: Left Heart Cath and Coronary Angiography;  Surgeon: [name], MD;  Location: [place] [facility];  Service: Cardiovascular;  Laterality: Right;    Prior to Admission medications   Medication Sig Start Date End Date Taking? Authorizing Provider  albuterol (PROVENTIL HFA;VENTOLIN HFA) 108 (90 Base) MCG/ACT inhaler Inhale 2 puffs into the lungs daily as needed for wheezing or shortness of breath. 06/11/16  Yes Darylene Price A, FNP  amLODipine (NORVASC) 10 MG tablet Take 10  mg by mouth daily. 09/20/20  Yes [provider]  apixaban (ELIQUIS) 5 MG TABS tablet Take 1 tablet (5 mg total) by mouth 2 (two) times daily. 06/11/16  Yes Darylene Price A, FNP  aspirin EC 81 MG tablet Take 81 mg by mouth daily.   Yes [provider]  atorvastatin (LIPITOR) 40 MG tablet Take 1 tablet (40 mg total) by mouth daily at 6 PM. 06/11/16  Yes Alisa Graff, FNP   carvedilol (COREG) 12.5 MG tablet Take 1 tablet (12.5 mg total) by mouth 2 (two) times daily with a meal. 06/11/16  Yes Darylene Price A, FNP  hydrALAZINE (APRESOLINE) 100 MG tablet Take 100 mg by mouth 3 (three) times daily. 08/10/20  Yes [provider]  sacubitril-valsartan (ENTRESTO) 24-26 MG Take 1 tablet by mouth 2 (two) times daily. 05/13/16  Yes Darylene Price A, FNP  spironolactone (ALDACTONE) 25 MG tablet Take 1 tablet (25 mg total) by mouth daily. 06/11/16  Yes Darylene Price A, FNP  atorvastatin (LIPITOR) 40 MG tablet 1 tablet Patient not taking: Reported on 01/15/2021    [provider]  diclofenac sodium (VOLTAREN) 1 % GEL Apply 2 g topically 3 (three) times daily as needed (pain). Patient not taking: Reported on 01/14/2018 08/29/16   Merlyn Lot, MD  furosemide (LASIX) 20 MG tablet Take 1 tablet (20 mg total) by mouth daily. Patient not taking: Reported on 01/15/2021 06/11/16   Alisa Graff, FNP  gabapentin (NEURONTIN) 100 MG capsule Take 1 capsule (100 mg total) by mouth 2 (two) times daily. Patient not taking: Reported on 01/15/2021 08/29/16 09/10/16  Merlyn Lot, MD  metFORMIN (GLUCOPHAGE) 500 MG tablet Take by mouth. Patient not taking: Reported on 01/15/2021    [provider]  metoprolol tartrate (LOPRESSOR) 25 MG tablet Take by mouth. Patient not taking: Reported on 01/15/2021 07/07/14   [provider]    Current Facility-Administered Medications  Medication Dose Route Frequency Provider Last Rate Last Admin   acetaminophen (TYLENOL) tablet 650 mg  650 mg Oral Q6H PRN Emokpae, Ejiroghene E, MD   650 mg at 01/16/21 0844   Or   acetaminophen (TYLENOL) suppository 650 mg  650 mg Rectal Q6H PRN Emokpae, Ejiroghene E, MD       amLODipine (NORVASC) tablet 10 mg  10 mg Oral Daily Emokpae, Ejiroghene E, MD       atorvastatin (LIPITOR) tablet 40 mg  40 mg Oral q1800 Emokpae, Ejiroghene E, MD       carvedilol (COREG) tablet 12.5 mg  12.5  mg Oral BID WC Emokpae, Ejiroghene E, MD   12.5 mg at 01/15/21 1857   furosemide (LASIX) tablet 20 mg  20 mg Oral Daily Emokpae, Ejiroghene E, MD   20 mg at 01/16/21 0844   guaiFENesin-dextromethorphan (ROBITUSSIN DM) 100-10 MG/5ML syrup 5 mL  5 mL Oral Q4H PRN Emokpae, Ejiroghene E, MD   5 mL at 01/16/21 0844   hydrALAZINE (APRESOLINE) tablet 100 mg  100 mg Oral TID Emokpae, Ejiroghene E, MD       magnesium sulfate IVPB 4 g 100 mL  4 g Intravenous Once Kathie Dike, MD       ondansetron (ZOFRAN) tablet 4 mg  4 mg Oral Q6H PRN Emokpae, Ejiroghene E, MD       Or   ondansetron (ZOFRAN) injection 4 mg  4 mg Intravenous Q6H PRN Emokpae, Ejiroghene E, MD       oseltamivir (TAMIFLU) capsule 30 mg  30 mg Oral BID  Emokpae, Ejiroghene E, MD   30 mg at 01/15/21 2100   pantoprazole (PROTONIX) injection 40 mg  40 mg Intravenous Q24H Emokpae, Ejiroghene E, MD   40 mg at 01/15/21 2158   polyethylene glycol (MIRALAX / GLYCOLAX) packet 17 g  17 g Oral Daily PRN Emokpae, Ejiroghene E, MD       sacubitril-valsartan (ENTRESTO) 24-26 mg per tablet  1 tablet Oral BID Emokpae, Ejiroghene E, MD   1 tablet at 01/16/21 0843   spironolactone (ALDACTONE) tablet 25 mg  25 mg Oral Daily Emokpae, Ejiroghene E, MD   25 mg at 01/16/21 0845   vitamin B-12 (CYANOCOBALAMIN) tablet 500 mcg  500 mcg Oral Daily Emokpae, Ejiroghene E, MD   500 mcg at 01/16/21 0842    Allergies as of 01/15/2021   (No Known Allergies)    Family History  Problem Relation Age of Onset   Diabetes Mother    Heart disease Mother    Heart attack Father    Diabetes Brother    Breast cancer Maternal Aunt    Breast cancer Maternal Grandmother     Social History   Socioeconomic History   Marital status: Widowed    Spouse name: Not on file   Number of children: Not on file   Years of education: Not on file   Highest education level: Not on file  Occupational History   Not on file  Tobacco Use   Smoking status: Never   Smokeless tobacco:  Never  Vaping Use   Vaping Use: Never used  Substance and Sexual Activity   Alcohol use: No   Drug use: No   Sexual activity: Not on file  Other Topics Concern   Not on file  Social History Narrative   Not on file   Social Determinants of Health   Financial Resource Strain: Not on file  Food Insecurity: Not on file  Transportation Needs: Not on file  Physical Activity: Not on file  Stress: Not on file  Social Connections: Not on file  Intimate Partner Violence: Not on file    Review of Systems: Gen: Admits to fatigue, no fever or chills today.  CV: Denies chest pain, heart palpitations. Resp: Denies shortness of breath. Admits to mild cough.  GI: See HPI GU : Denies urinary burning, urinary frequency, urinary incontinence.  MS: Denies joint pain Derm: Denies rash Psych: Denies depression, anxiety Heme: See HPI  Physical Exam: Vital signs in last 24 hours: Temp:  [98.2 F (36.8 C)-99.5 F (37.5 C)] 99.5 F (37.5 C) (11/29 0807) Pulse Rate:  [43-98] 80 (11/29 0807) Resp:  [15-30] 18 (11/29 0807) BP: (99-158)/(42-91) 103/42 (11/29 0807) SpO2:  [95 %-100 %] 98 % (11/29 0807) Weight:  [90.7 kg] 90.7 kg (11/28 1345) Last BM Date: 01/15/21 General:   Alert,  Well-developed, well-nourished, pleasant and cooperative in NAD Head:  Normocephalic and atraumatic. Eyes:  Sclera clear, no icterus.   Conjunctiva pink. Ears:  Normal auditory acuity. Lungs: Wheezes appreciated in right lower lung field. No crackles, or rhonchi. No acute distress. Heart:  Regular rate and rhythm; no murmurs, clicks, rubs,  or gallops. Abdomen:  Soft, nontender and nondistended. No masses, hepatosplenomegaly or hernias noted. Normal bowel sounds, without guarding, and without rebound.   Rectal:  Deferred until time of colonoscopy.   Msk:  Symmetrical without gross deformities. Normal posture. Extremities:  Without edema. Neurologic:  Alert and  oriented x4;  grossly normal neurologically. Skin:   Intact without significant lesions or rashes.  Psych:  Normal mood and affect.  Intake/Output from previous day: 11/28 0701 - 11/29 0700 In: 8127 [P.O.:480; Blood:442; IV Piggyback:500] Out: -  Intake/Output this shift: No intake/output data recorded.  Lab Results: Recent Labs    01/15/21 1458 01/15/21 2354 01/15/21 2356  WBC 2.7*  --  1.9*  HGB 7.0* 7.7* 7.7*  HCT 21.7* 23.3* 23.4*  PLT 243  --  213   BMET Recent Labs    01/15/21 1458  NA 131*  K 3.4*  CL 102  CO2 20*  GLUCOSE 126*  BUN 18  CREATININE 1.05*  CALCIUM 7.3*   LFT Recent Labs    01/15/21 1458  PROT 6.3*  ALBUMIN 2.4*  AST 17  ALT 10  ALKPHOS 37*  BILITOT 0.9   Studies/Results: DG Chest Port 1 View  Result Date: 01/15/2021 CLINICAL DATA:  Near syncopal episode in flu like symptoms. Hemoglobin of 6.9. History of CHF with cough for 1 week. EXAM: PORTABLE CHEST 1 VIEW COMPARISON:  March of 2018. FINDINGS: EKG leads project over the chest. Cardiac enlargement and fullness of the RIGHT and LEFT hilum is a stable finding based on comparison with previous imaging. There is no lobar consolidative process. No sign of pleural effusion on frontal radiograph or pneumothorax. Subtle opacities scattered throughout the upper and mid chest bilaterally suggested on the current study. On limited assessment there is no acute skeletal process. IMPRESSION: Cardiomegaly with central pulmonary vascular engorgement. Subtle opacities scattered throughout the upper and mid chest bilaterally, findings could represent asymmetric edema versus is atypical infection, consider follow-up PA and lateral chest radiograph when the patient is able to ensure resolution. Electronically Signed   By: Zetta Bills M.D.   On: 01/15/2021 15:35    Impression: 70 y.o. year old female with history of HTN, HLD, chronic heart failure, atrial fibrillation on Eliquis, who presented to the emergency room from [facility] due to low  hemoglobin. She was being evaluated for cough, congestion, and fever for about 1 week, and passed out while having a blood draw, blood work revealed hemoglobin of 6.9.  She also reported history of syncope about 2 weeks prior s/p evaluation at [facility] ED, suspected to be vasovagal in etiology at that time. Now admitted with symptomatic anemia and influenza A with pneumonia.   Symptomatic anemia with iron and B 12 deficiency: Hemoglobin 7.0 on admission, down from 9.8, 2 weeks ago, prior to this, hemoglobin 13.1 in 2019.  Ferritin elevated at 376, likely secondary to acute illness.  Iron 12 (L), saturation 9% (L). B12 176. Stool heme positive. BUN wnl. She admits to few week history of intermittent dark/black stools with last occurrence yesterday. Denies brbpr. Also reports about 50 lb weight loss over the last 2-3 years. Decreased appetite over the last 2 weeks, but prior to this, she was eating well. No other significant GI symptoms. Denies NSAIDs aside from 81 mg aspirin. She is compliant with her Eliquis, last dose morning of 11/28. Per patient, last colonoscopy a couple years ago at [facility] without polyps. No prior EGD.    Differentials include PUD, gastritis, duodenitis, AMVs, polyps, and can't rule out malignancy. With new onset IDA, would recommend EGD and colonoscopy. She did have a regular diet this morning, so we will likely proceed with EGD tomorrow. Consider colonoscopy Thursday pending EGD findings. Will discuss with Dr. Jenetta Downer.    Influenza/Pneumonia:  Stable on RA. Currently on Tamiflu. Management per hospitalist.   Syncope: Discussed with  Dr. Roderic Palau. Suspected to be secondary to hypovoluminemia  in the setting of decreased po intake and anemia. Planning on ECHO and fluid/PRBC resuscitation.   Hypomagnesemia/hyponatremia/hypokalemia:  Hypokalemia improved. Received magnesium today. Na stable at 131 today. Management per hospitalist.  Plan: Consider EGD with Dr.  Laural Golden tomorrow and colonoscopy Thursday pending EGD findings. Will discuss with Dr. Jenetta Downer.  OK to continue regular diet today. NPO at midnight.   Continue to hold Eliquis. Last dose morning of 11/28.  IV Protonix 40 mg BID.  Continue vitamin B 12 daily per hospitalist. Monitor H/H and transfuse as needed.  Monitor for overt GI bleeding.  Management of electrolyte abnormalities per hospitalist.   LOS: 1 day    01/16/2021, 9:42 AM   Aliene Altes, PA-C [facility]

## 2021-01-16 NOTE — Progress Notes (Signed)
PROGRESS NOTE    Mackenzie [name]  EHM:[id number] DOB: 06/12/1950 DOA: 01/15/2021 PCP: [facility]    Brief Narrative:  70 year old female with a history of atrial fibrillation on anticoagulation, systolic CHF, diabetes, presented to the emergency room after having a syncopal episodes.  She reports having dark tarry stools recently.  FOBT was found to be positive.  CBC showed hemoglobin of 7.0, down from over 9 approximately 2 weeks ago.  She was admitted for further evaluation including PRBC transfusion and GI consultation.   Assessment & Plan:   Principal Problem:   Symptomatic anemia Active Problems:   Chronic systolic heart failure (HCC)   HTN (hypertension)   Syncope   Influenza A   Atrial fibrillation (HCC)   Pneumonia due to influenza   Dark stools   Syncope -At this point, I suspect that her syncope would be related to anemia and hypovolemia -Continue to monitor on telemetry for any arrhythmias -Update echocardiogram since last study was done in 2018  Acute blood loss anemia -Baseline hemoglobin is unclear, but hemoglobin was 9.8 approximately 2 weeks ago -Admission hemoglobin noted to be 7.0 -She received 1 unit PRBC with improvement of hemoglobin to 7.6 -With borderline low blood pressures and cardiac history, will transfuse another unit of PRBC -She also has B12 deficiency with a serum B12 of 176 -We will start on daily B12 replacement while in the hospital -Folate level 10.3  GI bleeding -Reports having recent dark stools -FOBT positive -Started on PPI -GI consult, consider endoscopy  Influenza A -Currently she is not hypoxic -Chest x-ray indicated atypical infection involving upper and mid lungs -Currently on Tamiflu  Leukopenia -Suspect this is secondary to underlying viral process -Continue to follow  Chronic systolic congestive heart failure -Currently appears to be euvolemic -She is continued on Entresto,  carvedilol, Lasix, spironolactone and hydralazine -Hold for soft blood pressures -Followed by cardiology, Dr. Humphrey Rolls at [facility]  Chronic atrial fibrillation -Heart rate is currently stable on Coreg -Chronically on Eliquis which was held on admission due to concerns for GI bleeding  Diabetes -Chronically on metformin, held on admission -A1c in process -Continue on sliding scale insulin    DVT prophylaxis: SCDs Start: 01/15/21 1658  Code Status: full code Family Communication: discussed with daughter at the bedside Disposition Plan: Status is: Inpatient  Remains inpatient appropriate because: Continued work-up for GI bleeding    Consultants:  Gastroenterology  Procedures:  Echo  Antimicrobials:  Tamiflu 11/28 >   Subjective: Patient is no longer having any dizziness.  Currently not having any abdominal pain.  Does report noticing dark/tarry stools at home.  Last bowel movement was reportedly yesterday.  Objective: Vitals:   01/16/21 0016 01/16/21 0617 01/16/21 0807 01/16/21 1004  BP: 106/85 119/61 (!) 103/42 109/63  Pulse: 65 74 80 75  Resp: 18 18 18    Temp: 98.3 F (36.8 C) 98.8 F (37.1 C) 99.5 F (37.5 C)   TempSrc: Oral Oral Axillary   SpO2: 95% 97% 98% 100%  Weight:      Height:        Intake/Output Summary (Last 24 hours) at 01/16/2021 1250 Last data filed at 01/16/2021 0900 Gross per 24 hour  Intake 1902 ml  Output --  Net 1902 ml   Filed Weights   01/15/21 1345  Weight: 90.7 kg    Examination:  General exam: Appears calm and comfortable  Respiratory system: Clear to auscultation. Respiratory effort normal. Cardiovascular system: S1 & S2  heard, RRR. No JVD, murmurs, rubs, gallops or clicks. No pedal edema. Gastrointestinal system: Abdomen is nondistended, soft and nontender. No organomegaly or masses felt. Normal bowel sounds heard. Central nervous system: Alert and oriented. No focal neurological deficits. Extremities: Symmetric 5  x 5 power. Skin: No rashes, lesions or ulcers Psychiatry: Judgement and insight appear normal. Mood & affect appropriate.     Data Reviewed: I have personally reviewed following labs and imaging studies  CBC: Recent Labs  Lab 01/15/21 1458 01/15/21 2354 01/15/21 2356 01/16/21 0946  WBC 2.7*  --  1.9* 2.5*  NEUTROABS 1.8  --   --   --   HGB 7.0* 7.7* 7.7* 7.6*  HCT 21.7* 23.3* 23.4* 23.9*  MCV 87.5  --  86.7 86.9  PLT 243  --  213 093   Basic Metabolic Panel: Recent Labs  Lab 01/15/21 1458 01/16/21 0946  NA 131* 131*  K 3.4* 3.9  CL 102 105  CO2 20* 21*  GLUCOSE 126* 163*  BUN 18 18  CREATININE 1.05* 1.13*  CALCIUM 7.3* 7.4*  MG 1.6*  --   PHOS  --  2.0*   GFR: Estimated Creatinine Clearance: 52.6 mL/min (A) (by C-G formula based on SCr of 1.13 mg/dL (H)). Liver Function Tests: Recent Labs  Lab 01/15/21 1458 01/16/21 0946  AST 17 17  ALT 10 10  ALKPHOS 37* 36*  BILITOT 0.9 0.7  PROT 6.3* 5.9*  ALBUMIN 2.4* 2.2*   No results for input(s): LIPASE, AMYLASE in the last 168 hours. No results for input(s): AMMONIA in the last 168 hours. Coagulation Profile: No results for input(s): INR, PROTIME in the last 168 hours. Cardiac Enzymes: No results for input(s): CKTOTAL, CKMB, CKMBINDEX, TROPONINI in the last 168 hours. BNP (last 3 results) No results for input(s): PROBNP in the last 8760 hours. HbA1C: No results for input(s): HGBA1C in the last 72 hours. CBG: Recent Labs  Lab 01/16/21 0716  GLUCAP 91   Lipid Profile: No results for input(s): CHOL, HDL, LDLCALC, TRIG, CHOLHDL, LDLDIRECT in the last 72 hours. Thyroid Function Tests: No results for input(s): TSH, T4TOTAL, FREET4, T3FREE, THYROIDAB in the last 72 hours. Anemia Panel: Recent Labs    01/15/21 1458  VITAMINB12 176*  FOLATE 10.3  FERRITIN 376*  TIBC 128*  IRON 12*  RETICCTPCT 1.8   Sepsis Labs: Recent Labs  Lab 01/15/21 1458  PROCALCITON <0.10    Recent Results (from the past  240 hour(s))  Resp Panel by RT-PCR (Flu A&B, Covid) Nasopharyngeal Swab     Status: Abnormal   Collection Time: 01/15/21  3:26 PM   Specimen: Nasopharyngeal Swab; Nasopharyngeal(NP) swabs in vial transport medium  Result Value Ref Range Status   SARS Coronavirus 2 by RT PCR NEGATIVE NEGATIVE Final    Comment: (NOTE) SARS-CoV-2 target nucleic acids are NOT DETECTED.  The SARS-CoV-2 RNA is generally detectable in upper respiratory specimens during the acute phase of infection. The lowest concentration of SARS-CoV-2 viral copies this assay can detect is 138 copies/mL. A negative result does not preclude SARS-Cov-2 infection and should not be used as the sole basis for treatment or other patient management decisions. A negative result may occur with  improper specimen collection/handling, submission of specimen other than nasopharyngeal swab, presence of viral mutation(s) within the areas targeted by this assay, and inadequate number of viral copies(<138 copies/mL). A negative result must be combined with clinical observations, patient history, and epidemiological information. The expected result is Negative.  Fact Sheet for  Patients:  EntrepreneurPulse.com.au  Fact Sheet for Healthcare Providers:  IncredibleEmployment.be  This test is no t yet approved or cleared by the [place] FDA and  has been authorized for detection and/or diagnosis of SARS-CoV-2 by FDA under an Emergency Use Authorization (EUA). This EUA will remain  in effect (meaning this test can be used) for the duration of the COVID-19 declaration under Section 564(b)(1) of the Act, 21 U.S.C.section 360bbb-3(b)(1), unless the authorization is terminated  or revoked sooner.       Influenza A by PCR POSITIVE (A) NEGATIVE Final   Influenza B by PCR NEGATIVE NEGATIVE Final    Comment: (NOTE) The Xpert Xpress SARS-CoV-2/FLU/RSV plus assay is intended as an aid in the diagnosis of  influenza from Nasopharyngeal swab specimens and should not be used as a sole basis for treatment. Nasal washings and aspirates are unacceptable for Xpert Xpress SARS-CoV-2/FLU/RSV testing.  Fact Sheet for Patients: EntrepreneurPulse.com.au  Fact Sheet for Healthcare Providers: IncredibleEmployment.be  This test is not yet approved or cleared by the [place] FDA and has been authorized for detection and/or diagnosis of SARS-CoV-2 by FDA under an Emergency Use Authorization (EUA). This EUA will remain in effect (meaning this test can be used) for the duration of the COVID-19 declaration under Section 564(b)(1) of the Act, 21 U.S.C. section 360bbb-3(b)(1), unless the authorization is terminated or revoked.  Performed at [facility], [address], [place], [place] [address]          Radiology Studies: [facility] Chest [facility] 1 View  Result Date: 01/15/2021 CLINICAL DATA:  Near syncopal episode in flu like symptoms. Hemoglobin of 6.9. History of CHF with cough for 1 week. EXAM: PORTABLE CHEST 1 VIEW COMPARISON:  March of 2018. FINDINGS: EKG leads project over the chest. Cardiac enlargement and fullness of the RIGHT and LEFT hilum is a stable finding based on comparison with previous imaging. There is no lobar consolidative process. No sign of pleural effusion on frontal radiograph or pneumothorax. Subtle opacities scattered throughout the upper and mid chest bilaterally suggested on the current study. On limited assessment there is no acute skeletal process. IMPRESSION: Cardiomegaly with central pulmonary vascular engorgement. Subtle opacities scattered throughout the upper and mid chest bilaterally, findings could represent asymmetric edema versus is atypical infection, consider follow-up PA and lateral chest radiograph when the patient is able to ensure resolution. Electronically Signed   By: Zetta Bills M.D.   On: 01/15/2021 15:35         Scheduled Meds:  sodium chloride   Intravenous Once   amLODipine  10 mg Oral Daily   atorvastatin  40 mg Oral q1800   carvedilol  12.5 mg Oral BID WC   cyanocobalamin  1,000 mcg Intramuscular Q1400   furosemide  20 mg Oral Daily   hydrALAZINE  100 mg Oral TID   oseltamivir  30 mg Oral BID   pantoprazole (PROTONIX) IV  40 mg Intravenous Q12H   sacubitril-valsartan  1 tablet Oral BID   spironolactone  25 mg Oral Daily   vitamin B-12  500 mcg Oral Daily   Continuous Infusions:  magnesium sulfate bolus IVPB 4 g (01/16/21 1059)     LOS: 1 day    Time spent: 61mins    Kathie Dike, MD [facility]   If 7PM-7AM, please contact night-coverage [email or web address]  01/16/2021, 12:50 PM

## 2021-01-16 NOTE — Progress Notes (Signed)
   01/16/21 1004  Vitals  BP 109/63  MAP (mmHg) 76  BP Method Automatic  Pulse Rate 75  Pulse Rate Source Monitor  MEWS COLOR  MEWS Score Color Green  Orthostatic Lying   BP- Lying 115/70  Pulse- Lying 73  Orthostatic Sitting  BP- Sitting 111/63  Pulse- Sitting 75  Orthostatic Standing at 0 minutes  BP- Standing at 0 minutes 102/57  Pulse- Standing at 0 minutes 95  Orthostatic Standing at 3 minutes  BP- Standing at 3 minutes 102/59  Pulse- Standing at 3 minutes 73  Oxygen Therapy  SpO2 100 %  MEWS Score  MEWS Temp 0  MEWS Systolic 0  MEWS Pulse 0  MEWS RR 0  MEWS LOC 0  MEWS Score 0

## 2021-01-17 ENCOUNTER — Encounter (HOSPITAL_COMMUNITY): Payer: Self-pay | Admitting: Internal Medicine

## 2021-01-17 ENCOUNTER — Inpatient Hospital Stay (HOSPITAL_COMMUNITY): Payer: Medicare Other | Admitting: Anesthesiology

## 2021-01-17 ENCOUNTER — Encounter (HOSPITAL_COMMUNITY)
Admission: EM | Disposition: A | Discharge status: Home / Self Care | Payer: Self-pay | Source: Home / Self Care | Attending: Internal Medicine

## 2021-01-17 DIAGNOSIS — I5042 Chronic combined systolic (congestive) and diastolic (congestive) heart failure: Secondary | ICD-10-CM

## 2021-01-17 DIAGNOSIS — K921 Melena: Secondary | ICD-10-CM

## 2021-01-17 DIAGNOSIS — E669 Obesity, unspecified: Secondary | ICD-10-CM

## 2021-01-17 DIAGNOSIS — D509 Iron deficiency anemia, unspecified: Secondary | ICD-10-CM

## 2021-01-17 DIAGNOSIS — K449 Diaphragmatic hernia without obstruction or gangrene: Secondary | ICD-10-CM

## 2021-01-17 DIAGNOSIS — E785 Hyperlipidemia, unspecified: Secondary | ICD-10-CM

## 2021-01-17 DIAGNOSIS — K3189 Other diseases of stomach and duodenum: Secondary | ICD-10-CM

## 2021-01-17 DIAGNOSIS — E1169 Type 2 diabetes mellitus with other specified complication: Secondary | ICD-10-CM

## 2021-01-17 HISTORY — PX: ESOPHAGOGASTRODUODENOSCOPY (EGD) WITH PROPOFOL: SHX5813

## 2021-01-17 LAB — BASIC METABOLIC PANEL
Anion gap: 6 (ref 5–15)
BUN: 13 mg/dL (ref 8–23)
CO2: 23 mmol/L (ref 22–32)
Calcium: 7.6 mg/dL — ABNORMAL LOW (ref 8.9–10.3)
Chloride: 105 mmol/L (ref 98–111)
Creatinine, Ser: 1.02 mg/dL — ABNORMAL HIGH (ref 0.44–1.00)
GFR, Estimated: 59 mL/min — ABNORMAL LOW (ref >60)
Glucose, Bld: 98 mg/dL (ref 70–99)
Potassium: 4.2 mmol/L (ref 3.5–5.1)
Sodium: 134 mmol/L — ABNORMAL LOW (ref 135–145)

## 2021-01-17 LAB — TYPE AND SCREEN
ABO/RH(D): O POS
Antibody Screen: NEGATIVE
Unit division: 0
Unit division: 0

## 2021-01-17 LAB — GLUCOSE, CAPILLARY
Glucose-Capillary: 93 mg/dL (ref 70–99)
Glucose-Capillary: 96 mg/dL (ref 70–99)
Glucose-Capillary: 86 mg/dL (ref 70–99)
Glucose-Capillary: 87 mg/dL (ref 70–99)
Glucose-Capillary: 103 mg/dL — ABNORMAL HIGH (ref 70–99)

## 2021-01-17 LAB — CBC
HCT: 27.4 % — ABNORMAL LOW (ref 36.0–46.0)
Hemoglobin: 9.2 g/dL — ABNORMAL LOW (ref 12.0–15.0)
MCH: 29 pg (ref 26.0–34.0)
MCHC: 33.6 g/dL (ref 30.0–36.0)
MCV: 86.4 fL (ref 80.0–100.0)
Platelets: 259 10*3/uL (ref 150–400)
RBC: 3.17 MIL/uL — ABNORMAL LOW (ref 3.87–5.11)
RDW: 14 % (ref 11.5–15.5)
WBC: 2.8 10*3/uL — ABNORMAL LOW (ref 4.0–10.5)
nRBC: 0 % (ref 0.0–0.2)

## 2021-01-17 LAB — BPAM RBC
Blood Product Expiration Date: 202301022359
Blood Product Expiration Date: 202301022359
ISSUE DATE / TIME: 202211281746
ISSUE DATE / TIME: 202211291410
Unit Type and Rh: 5100
Unit Type and Rh: 5100

## 2021-01-17 LAB — MAGNESIUM: Magnesium: 2.4 mg/dL (ref 1.7–2.4)

## 2021-01-17 SURGERY — ESOPHAGOGASTRODUODENOSCOPY (EGD) WITH PROPOFOL
Anesthesia: General

## 2021-01-17 MED ORDER — CYANOCOBALAMIN 1000 MCG/ML IJ SOLN
1000.0000 ug | Freq: Every day | INTRAMUSCULAR | Status: DC
  Administered 2021-01-18 – 2021-01-20 (×2): 1000 ug via INTRAMUSCULAR
  Filled 2021-01-17 (×3): qty 1

## 2021-01-17 MED ORDER — HYDRALAZINE HCL 25 MG PO TABS
75.0000 mg | ORAL_TABLET | Freq: Three times a day (TID) | ORAL | Status: DC
  Administered 2021-01-18 – 2021-01-19 (×3): 75 mg via ORAL
  Filled 2021-01-17 (×5): qty 3

## 2021-01-17 MED ORDER — SPIRONOLACTONE 25 MG PO TABS
12.5000 mg | ORAL_TABLET | Freq: Every day | ORAL | Status: DC
  Administered 2021-01-18 – 2021-01-20 (×2): 12.5 mg via ORAL
  Filled 2021-01-17 (×2): qty 1
  Filled 2021-01-17: qty 0.5
  Filled 2021-01-17: qty 1
  Filled 2021-01-17 (×2): qty 0.5
  Filled 2021-01-17: qty 1

## 2021-01-17 MED ORDER — LACTATED RINGERS IV SOLN: INTRAVENOUS | Status: DC

## 2021-01-17 MED ORDER — PROPOFOL 10 MG/ML IV BOLUS
INTRAVENOUS | Status: DC | PRN
  Administered 2021-01-17: 200 mg via INTRAVENOUS

## 2021-01-17 MED ORDER — AMLODIPINE BESYLATE 5 MG PO TABS
5.0000 mg | ORAL_TABLET | Freq: Every day | ORAL | Status: DC
  Administered 2021-01-18: 5 mg via ORAL
  Filled 2021-01-17 (×3): qty 1

## 2021-01-17 NOTE — Progress Notes (Signed)
PROGRESS NOTE    Mackenzie [name]  UXN:[id number] DOB: 07/24/1950 DOA: 01/15/2021 PCP: [facility], [facility] Personal    Brief Narrative:  70 year old female with a history of atrial fibrillation on anticoagulation, systolic CHF, diabetes, presented to the emergency room after having a syncopal episodes.  She reports having dark tarry stools recently.  FOBT was found to be positive.  CBC showed hemoglobin of 7.0, down from over 9 approximately 2 weeks ago.  She was admitted for further evaluation including PRBC transfusion and GI consultation.   Assessment & Plan:   Principal Problem:   Symptomatic anemia Active Problems:   Chronic systolic heart failure (HCC)   HTN (hypertension)   Syncope   Influenza A   Atrial fibrillation (HCC)   Pneumonia due to influenza   Dark stools   Syncope -At this point, I suspect that her syncope would be related to anemia and hypovolemia -Continue to monitor on telemetry for any arrhythmias -Updated echocardiogram since last study was done in 2018; demonstrating preserved ejection fraction 60 to 65%, no wall motion abnormalities, moderate asymmetric left ventricular hypertrophy of the basal septal segment and grade 2 diastolic dysfunction.  No significant valvular abnormalities  Acute blood loss anemia -Baseline hemoglobin is unclear, but hemoglobin was 9.8 approximately 2 weeks ago -Admission hemoglobin noted to be 7.0 -She received 2 units of PRBC with improvement of hemoglobin to 9.2 -Given cardiac history transfusion threshold is less than 7.5. -Patient also found to be B12 deficient; will initiate repletion.  GI bleeding -Reports having recent dark stools -FOBT positive -Continue PPI -GI consultation appreciated; endoscopic evaluation unrevealing of bleeding source.  Per recommendations diet will be advanced and anticipated colonoscopy intervention planned for 01-19-21. Bowel prep tomorrow.  Influenza A pneumonia -Currently she  is not hypoxic -Reported no shortness of breath and just expressing mild URI symptoms. -Chest x-ray indicated atypical infection involving upper and mid lungs -Continue treatment with Tamiflu and supportive care.  Leukopenia -Suspect this is secondary to underlying viral process -Continue to follow WBCs trend.  Chronic systolic and congestive heart failure -Currently appears to be euvolemic -Repeat echo demonstrating normalization of her ejection fraction at this time.  Grade 2 diastolic dysfunction appreciated. -Follow daily weights and strict I's and O's. -She is continued on Entresto, carvedilol, Lasix, spironolactone and hydralazine -Will hold for soft blood pressures -Followed by cardiology, Dr. Humphrey Rolls at [facility]  Chronic atrial fibrillation -Heart rate is currently stable on Coreg -Chronically on Eliquis which was held on admission due to concerns for acute GI bleeding. -Follow-up GI stable recommendation for when is safe to resume anticoagulation.  Type 2 diabetes mellitus diabetes -Chronically on metformin -A1c 5.8 (demonstrating a stable condition and reaching prediabetes level at this time) -Continue on sliding scale insulin while inpatient. -Resume the use of metformin at discharge.  Class I obesity -Body mass index is 32.28 kg/m. -Low calorie diet, portion control and increase physical activity discussed with patient.  Essential hypertension -Blood pressure soft in the setting of hypovolemia and acute GI bleed -Antihypertensive agent has been adjusted -Follow vital signs   DVT prophylaxis: SCDs Start: 01/15/21 1658  Code Status: full code Family Communication: discussed with daughter at the bedside Disposition Plan: Status is: Inpatient  Remains inpatient appropriate because: Continued work-up for GI bleeding    Consultants:  Gastroenterology  Procedures:  Echo  Antimicrobials:  Tamiflu 11/28 > planning to treat for 5  days.   Subjective: patient denies dizziness, lightheadedness, chest pain, shortness of  breath or any other complaints.  No operative bleeding.   Objective: Vitals:   01/17/21 1554 01/17/21 1601 01/17/21 1615 01/17/21 1638  BP:  (!) 93/57 120/63 122/63  Pulse:  82 78 79  Resp:  (!) 27 16 18   Temp: 100.3 F (37.9 C)  99.8 F (37.7 C) 99.6 F (37.6 C)  TempSrc:    Oral  SpO2: 97% 97% 98% 98%  Weight:      Height:        Intake/Output Summary (Last 24 hours) at 01/17/2021 1822 Last data filed at 01/17/2021 1550 Gross per 24 hour  Intake 417.8 ml  Output --  Net 417.8 ml   Filed Weights   01/15/21 1345  Weight: 90.7 kg    Examination: General exam: Alert, awake, oriented x 3; reporting no shortness of breath, no chest pain, no nausea, no vomiting, no further bleeding episodes.  Patient with good saturation on room air. Respiratory system: No using accessory muscles; normal respiratory effort.  Positive scattered rhonchi. Cardiovascular system:RRR. No rubs or gallops; no JVD. Gastrointestinal system: Abdomen is obese, nondistended, soft and nontender. No organomegaly or masses felt. Normal bowel sounds heard. Central nervous system: Alert and oriented. No focal neurological deficits. Extremities: No cyanosis, edema Or clubbing. Skin: No rashes, lesions or ulcers. Psychiatry: Judgement and insight appear normal. Mood & affect appropriate.   Data Reviewed: I have personally reviewed following labs and imaging studies  CBC: Recent Labs  Lab 01/15/21 1458 01/15/21 2354 01/15/21 2356 01/16/21 0946 01/17/21 0449  WBC 2.7*  --  1.9* 2.5* 2.8*  NEUTROABS 1.8  --   --   --   --   HGB 7.0* 7.7* 7.7* 7.6* 9.2*  HCT 21.7* 23.3* 23.4* 23.9* 27.4*  MCV 87.5  --  86.7 86.9 86.4  PLT 243  --  213 219 387   Basic Metabolic Panel: Recent Labs  Lab 01/15/21 1458 01/16/21 0946 01/17/21 0449  NA 131* 131* 134*  K 3.4* 3.9 4.2  CL 102 105 105  CO2 20* 21* 23  GLUCOSE 126*  163* 98  BUN 18 18 13   CREATININE 1.05* 1.13* 1.02*  CALCIUM 7.3* 7.4* 7.6*  MG 1.6*  --  2.4  PHOS  --  2.0*  --    GFR: Estimated Creatinine Clearance: 58.3 mL/min (A) (by C-G formula based on SCr of 1.02 mg/dL (H)).  Liver Function Tests: Recent Labs  Lab 01/15/21 1458 01/16/21 0946  AST 17 17  ALT 10 10  ALKPHOS 37* 36*  BILITOT 0.9 0.7  PROT 6.3* 5.9*  ALBUMIN 2.4* 2.2*   HbA1C: Recent Labs    01/15/21 2354  HGBA1C 5.8*   CBG: Recent Labs  Lab 01/16/21 2105 01/17/21 0724 01/17/21 1111 01/17/21 1534 01/17/21 1601  GLUCAP 94 93 96 87 86   Anemia Panel: Recent Labs    01/15/21 1458  VITAMINB12 176*  FOLATE 10.3  FERRITIN 376*  TIBC 128*  IRON 12*  RETICCTPCT 1.8   Sepsis Labs: Recent Labs  Lab 01/15/21 1458  PROCALCITON <0.10    Recent Results (from the past 240 hour(s))  Resp Panel by RT-PCR (Flu A&B, Covid) Nasopharyngeal Swab     Status: Abnormal   Collection Time: 01/15/21  3:26 PM   Specimen: Nasopharyngeal Swab; Nasopharyngeal(NP) swabs in vial transport medium  Result Value Ref Range Status   SARS Coronavirus 2 by RT PCR NEGATIVE NEGATIVE Final    Comment: (NOTE) SARS-CoV-2 target nucleic acids are NOT DETECTED.  The SARS-CoV-2  RNA is generally detectable in upper respiratory specimens during the acute phase of infection. The lowest concentration of SARS-CoV-2 viral copies this assay can detect is 138 copies/mL. A negative result does not preclude SARS-Cov-2 infection and should not be used as the sole basis for treatment or other patient management decisions. A negative result may occur with  improper specimen collection/handling, submission of specimen other than nasopharyngeal swab, presence of viral mutation(s) within the areas targeted by this assay, and inadequate number of viral copies(<138 copies/mL). A negative result must be combined with clinical observations, patient history, and epidemiological information. The expected  result is Negative.  Fact Sheet for Patients:  EntrepreneurPulse.com.au  Fact Sheet for Healthcare Providers:  IncredibleEmployment.be  This test is no t yet approved or cleared by the [place] FDA and  has been authorized for detection and/or diagnosis of SARS-CoV-2 by FDA under an Emergency Use Authorization (EUA). This EUA will remain  in effect (meaning this test can be used) for the duration of the COVID-19 declaration under Section 564(b)(1) of the Act, 21 U.S.C.section 360bbb-3(b)(1), unless the authorization is terminated  or revoked sooner.       Influenza A by PCR POSITIVE (A) NEGATIVE Final   Influenza B by PCR NEGATIVE NEGATIVE Final    Comment: (NOTE) The Xpert Xpress SARS-CoV-2/FLU/RSV plus assay is intended as an aid in the diagnosis of influenza from Nasopharyngeal swab specimens and should not be used as a sole basis for treatment. Nasal washings and aspirates are unacceptable for Xpert Xpress SARS-CoV-2/FLU/RSV testing.  Fact Sheet for Patients: EntrepreneurPulse.com.au  Fact Sheet for Healthcare Providers: IncredibleEmployment.be  This test is not yet approved or cleared by the [place] FDA and has been authorized for detection and/or diagnosis of SARS-CoV-2 by FDA under an Emergency Use Authorization (EUA). This EUA will remain in effect (meaning this test can be used) for the duration of the COVID-19 declaration under Section 564(b)(1) of the Act, 21 U.S.C. section 360bbb-3(b)(1), unless the authorization is terminated or revoked.  Performed at [facility], [address] [place], [place] [address]      Radiology Studies: ECHOCARDIOGRAM COMPLETE  Result Date: 01/16/2021    ECHOCARDIOGRAM REPORT   Patient Name:   Mackenzie [name] Date of Exam: 01/16/2021 Medical Rec #:  [id number]      Height:       66.0 in Accession #:    [id number]     Weight:       200.0 lb Date of  Birth:  [birth year]       BSA:          2.000 m Patient Age:    62 years       BP:           119/61 mmHg Patient Gender: F              HR:           74 bpm. Exam Location:  [facility] Procedure: 2D Echo, Cardiac Doppler and Color Doppler Indications:    Syncope  History:        Patient has prior history of Echocardiogram examinations, most                 recent 05/01/2016. CHF, Arrythmias:Atrial Fibrillation,                 Signs/Symptoms:Syncope; Risk Factors:Hypertension.  Sonographer:    Wenda Low Referring Phys: [place]  1. Left ventricular ejection fraction,  by estimation, is 60 to 65%. The left ventricle has normal function. The left ventricle has no regional wall motion abnormalities. There is moderate asymmetric left ventricular hypertrophy of the basal-septal segment. Left ventricular diastolic parameters are consistent with Grade II diastolic dysfunction (pseudonormalization).  2. Right ventricular systolic function is normal. The right ventricular size is normal. There is mildly elevated pulmonary artery systolic pressure. The estimated right ventricular systolic pressure is 81.2 mmHg.  3. Left atrial size was moderately dilated.  4. The mitral valve is abnormal. Trivial mitral valve regurgitation.  5. The aortic valve is tricuspid. Aortic valve regurgitation is mild. Aortic valve sclerosis/calcification is present, without any evidence of aortic stenosis. There is mild calcification of the aortic valve leaflets most notably on the [place].  6. The inferior vena cava is normal in size with greater than 50% respiratory variability, suggesting right atrial pressure of 3 mmHg. Comparison(s): No prior Echocardiogram. FINDINGS  Left Ventricle: Left ventricular ejection fraction, by estimation, is 60 to 65%. The left ventricle has normal function. The left ventricle has no regional wall motion abnormalities. The left ventricular internal cavity size was normal in size. There is   moderate asymmetric left ventricular hypertrophy of the basal-septal segment. Left ventricular diastolic parameters are consistent with Grade II diastolic dysfunction (pseudonormalization). Right Ventricle: The right ventricular size is normal. No increase in right ventricular wall thickness. Right ventricular systolic function is normal. There is mildly elevated pulmonary artery systolic pressure. The tricuspid regurgitant velocity is 3.09  m/s, and with an assumed right atrial pressure of 3 mmHg, the estimated right ventricular systolic pressure is 75.1 mmHg. Left Atrium: Left atrial size was moderately dilated. Right Atrium: Right atrial size was normal in size. Pericardium: There is no evidence of pericardial effusion. Mitral Valve: The mitral valve is abnormal. There is mild thickening of the mitral valve leaflet(s). There is mild calcification of the mitral valve leaflet(s). Mild to moderate mitral annular calcification. Trivial mitral valve regurgitation. MV peak gradient, 4.0 mmHg. The mean mitral valve gradient is 2.0 mmHg. Tricuspid Valve: The tricuspid valve is normal in structure. Tricuspid valve regurgitation is mild. Aortic Valve: There is calcifcation present most notably on the [place]. The aortic valve is tricuspid. Aortic valve regurgitation is mild. Aortic valve sclerosis/calcification is present, without any evidence of aortic stenosis. Aortic valve mean gradient measures 7.0 mmHg. Aortic valve peak gradient measures 12.8 mmHg. Aortic valve area, by VTI measures 1.57 cm. Pulmonic Valve: The pulmonic valve was normal in structure. Pulmonic valve regurgitation is mild. Aorta: The aortic root is normal in size and structure. Venous: The inferior vena cava is normal in size with greater than 50% respiratory variability, suggesting right atrial pressure of 3 mmHg. IAS/Shunts: No atrial level shunt detected by color flow Doppler.  LEFT VENTRICLE PLAX 2D LVIDd:         5.40 cm     Diastology LVIDs:          3.20 cm     LV e' medial:    5.29 cm/s LV PW:         0.90 cm     LV E/e' medial:  19.8 LV IVS:        1.20 cm     LV e' lateral:   6.73 cm/s LVOT diam:     1.90 cm     LV E/e' lateral: 15.6 LV SV:         64 LV SV Index:   32 LVOT Area:  2.84 cm  LV Volumes (MOD) LV vol d, MOD A2C: 88.3 ml LV vol d, MOD A4C: 60.5 ml LV vol s, MOD A2C: 25.1 ml LV vol s, MOD A4C: 27.3 ml LV SV MOD A2C:     63.2 ml LV SV MOD A4C:     60.5 ml LV SV MOD BP:      47.8 ml RIGHT VENTRICLE RV Basal diam:  3.75 cm RV Mid diam:    3.80 cm RV S prime:     15.20 cm/s TAPSE (M-mode): 2.6 cm LEFT ATRIUM             Index        RIGHT ATRIUM           Index LA diam:        4.40 cm 2.20 cm/m   RA Area:     16.80 cm LA Vol (A2C):   89.5 ml 44.75 ml/m  RA Volume:   44.80 ml  22.40 ml/m LA Vol (A4C):   76.1 ml 38.05 ml/m LA Biplane Vol: 81.1 ml 40.55 ml/m  AORTIC VALVE                     PULMONIC VALVE AV Area (Vmax):    1.58 cm      PV Vmax:       1.20 m/s AV Area (Vmean):   1.59 cm      PV Peak grad:  5.8 mmHg AV Area (VTI):     1.57 cm AV Vmax:           179.00 cm/s AV Vmean:          120.000 cm/s AV VTI:            0.409 m AV Peak Grad:      12.8 mmHg AV Mean Grad:      7.0 mmHg LVOT Vmax:         100.00 cm/s LVOT Vmean:        67.200 cm/s LVOT VTI:          0.227 m LVOT/AV VTI ratio: 0.56  AORTA Ao Root diam: 3.40 cm MITRAL VALVE                TRICUSPID VALVE MV Area (PHT): 2.81 cm     TR Peak grad:   38.2 mmHg MV Area VTI:   1.97 cm     TR Vmax:        309.00 cm/s MV Peak grad:  4.0 mmHg MV Mean grad:  2.0 mmHg     SHUNTS MV Vmax:       1.00 m/s     Systemic VTI:  0.23 m MV Vmean:      60.2 cm/s    Systemic Diam: 1.90 cm MV Decel Time: 270 msec MV E velocity: 105.00 cm/s MV A velocity: 66.70 cm/s MV E/A ratio:  1.57 Gwyndolyn Kaufman MD Electronically signed by Gwyndolyn Kaufman MD Signature Date/Time: 01/16/2021/6:13:09 PM    Final      Scheduled Meds:  amLODipine  10 mg Oral Daily   atorvastatin  40 mg Oral q1800    carvedilol  12.5 mg Oral BID WC   cyanocobalamin  1,000 mcg Intramuscular Q1400   furosemide  20 mg Oral Daily   hydrALAZINE  100 mg Oral TID   insulin aspart  0-9 Units Subcutaneous TID WC   oseltamivir  30 mg Oral BID   pantoprazole (PROTONIX) IV  40 mg Intravenous  Q12H   sacubitril-valsartan  1 tablet Oral BID   spironolactone  25 mg Oral Daily   vitamin B-12  500 mcg Oral Daily   Continuous Infusions:     LOS: 2 days    Time spent: 30 mins    Barton Dubois, MD [facility]   If 7PM-7AM, please contact night-coverage [email or web address]  01/17/2021, 6:22 PM

## 2021-01-17 NOTE — Progress Notes (Signed)
Brief EGD note.  Scant amount of blood in hypopharynx most likely due to suction injury. Normal mucosa of the esophagus. 2 cm sliding hiatal hernia. Edema to prepyloric mucosal folds but no evidence of erosions or ulcers. Pyloric channel is patent. Normal bulbar and post bulbar mucosa.  No bleeding source identified on EGD.

## 2021-01-17 NOTE — Anesthesia Postprocedure Evaluation (Signed)
Anesthesia Post Note  Patient: Mackenzie [name]  Procedure(s) Performed: ESOPHAGOGASTRODUODENOSCOPY (EGD) WITH PROPOFOL  Patient location during evaluation: PACU Anesthesia Type: General Level of consciousness: awake and alert Pain management: pain level controlled Vital Signs Assessment: post-procedure vital signs reviewed and stable Respiratory status: spontaneous breathing, nonlabored ventilation, respiratory function stable and patient connected to nasal cannula oxygen Cardiovascular status: blood pressure returned to baseline and stable Postop Assessment: no apparent nausea or vomiting Anesthetic complications: no   No notable events documented.   Last Vitals:  Vitals:   01/17/21 1344 01/17/21 1345  BP:    Pulse: 75 76  Resp: (!) 25 (!) 23  Temp:    SpO2: 93% 94%    Last Pain:  Vitals:   01/17/21 1334  TempSrc:   PainSc: 0-No pain                 Louann Sjogren

## 2021-01-17 NOTE — Anesthesia Preprocedure Evaluation (Signed)
Anesthesia Evaluation  Patient identified by MRN, date of birth, ID band Patient awake    Reviewed: Allergy & Precautions, H&P , NPO status , Patient's Chart, lab work & pertinent test results, reviewed documented beta blocker date and time   Airway Mallampati: II  TM Distance: >3 FB Neck ROM: full    Dental no notable dental hx.    Pulmonary neg pulmonary ROS,    Pulmonary exam normal breath sounds clear to auscultation       Cardiovascular Exercise Tolerance: Good hypertension, negative cardio ROS   Rhythm:regular Rate:Normal     Neuro/Psych negative neurological ROS  negative psych ROS   GI/Hepatic negative GI ROS, Neg liver ROS,   Endo/Other  negative endocrine ROS  Renal/GU negative Renal ROS  negative genitourinary   Musculoskeletal   Abdominal   Peds  Hematology  (+) Blood dyscrasia, anemia ,   Anesthesia Other Findings   Reproductive/Obstetrics negative OB ROS                             Anesthesia Physical Anesthesia Plan  ASA: 3 and emergent  Anesthesia Plan: General   Post-op Pain Management:    Induction:   PONV Risk Score and Plan: Propofol infusion  Airway Management Planned:   Additional Equipment:   Intra-op Plan:   Post-operative Plan:   Informed Consent: I have reviewed the patients History and Physical, chart, labs and discussed the procedure including the risks, benefits and alternatives for the proposed anesthesia with the patient or authorized representative who has indicated his/her understanding and acceptance.     Dental Advisory Given  Plan Discussed with: CRNA  Anesthesia Plan Comments:         Anesthesia Quick Evaluation

## 2021-01-17 NOTE — Progress Notes (Signed)
Subjective:  Patient has no complaints.  She denies nausea vomiting chest pain or shortness of breath.  She also had abdominal abdominal pain.  She states she is hungry.  Current Medications:  Current Facility-Administered Medications:    [MAR Hold] acetaminophen (TYLENOL) tablet 650 mg, 650 mg, Oral, Q6H PRN, 650 mg at 01/17/21 0414 **OR** [MAR Hold] acetaminophen (TYLENOL) suppository 650 mg, 650 mg, Rectal, Q6H PRN, Emokpae, Ejiroghene E, MD   [MAR Hold] amLODipine (NORVASC) tablet 10 mg, 10 mg, Oral, Daily, Emokpae, Ejiroghene E, MD, 10 mg at 01/17/21 0833   [MAR Hold] atorvastatin (LIPITOR) tablet 40 mg, 40 mg, Oral, q1800, Emokpae, Ejiroghene E, MD, 40 mg at 01/16/21 1733   [MAR Hold] carvedilol (COREG) tablet 12.5 mg, 12.5 mg, Oral, BID WC, Emokpae, Ejiroghene E, MD, 12.5 mg at 01/17/21 0834   [MAR Hold] cyanocobalamin ((VITAMIN B-12)) injection 1,000 mcg, 1,000 mcg, Intramuscular, Q1400, Kathie Dike, MD, 1,000 mcg at 01/17/21 1314   [MAR Hold] furosemide (LASIX) tablet 20 mg, 20 mg, Oral, Daily, Emokpae, Ejiroghene E, MD, 20 mg at 01/17/21 0833   [MAR Hold] guaiFENesin-dextromethorphan (ROBITUSSIN DM) 100-10 MG/5ML syrup 5 mL, 5 mL, Oral, Q4H PRN, Emokpae, Ejiroghene E, MD, 5 mL at 01/16/21 1807   [MAR Hold] hydrALAZINE (APRESOLINE) tablet 100 mg, 100 mg, Oral, TID, Emokpae, Ejiroghene E, MD, 100 mg at 01/17/21 0833   [MAR Hold] insulin aspart (novoLOG) injection 0-9 Units, 0-9 Units, Subcutaneous, TID WC, Kathie Dike, MD, 1 Units at 01/16/21 1802   lactated ringers infusion, , Intravenous, Continuous, Kiel, Coralie Keens, MD, Last Rate: 10 mL/hr at 01/17/21 1350, New Bag at 01/17/21 1350   [MAR Hold] ondansetron (ZOFRAN) tablet 4 mg, 4 mg, Oral, Q6H PRN **OR** [MAR Hold] ondansetron (ZOFRAN) injection 4 mg, 4 mg, Intravenous, Q6H PRN, Emokpae, Ejiroghene E, MD   [MAR Hold] oseltamivir (TAMIFLU) capsule 30 mg, 30 mg, Oral, BID, Emokpae, Ejiroghene E, MD, 30 mg at 01/17/21 0833   [MAR  Hold] pantoprazole (PROTONIX) injection 40 mg, 40 mg, Intravenous, Q12H, Harper, Kristen S, PA-C, 40 mg at 01/17/21 0830   [MAR Hold] polyethylene glycol (MIRALAX / GLYCOLAX) packet 17 g, 17 g, Oral, Daily PRN, Emokpae, Ejiroghene E, MD   [MAR Hold] sacubitril-valsartan (ENTRESTO) 24-26 mg per tablet, 1 tablet, Oral, BID, Emokpae, Ejiroghene E, MD, 1 tablet at 01/17/21 0834   [MAR Hold] spironolactone (ALDACTONE) tablet 25 mg, 25 mg, Oral, Daily, Emokpae, Ejiroghene E, MD, 25 mg at 01/17/21 0834   [MAR Hold] vitamin B-12 (CYANOCOBALAMIN) tablet 500 mcg, 500 mcg, Oral, Daily, Emokpae, Ejiroghene E, MD, 500 mcg at 01/17/21 2197   Objective: Blood pressure 126/61, pulse 76, temperature 99.4 F (37.4 C), resp. rate (!) 23, height $RemoveBe'5\' 6"'GMCEspitW$  (1.676 m), weight 90.7 kg, SpO2 94 %. Patient is alert and in no acute distress. Oropharyngeal mucosa is normal. No neck masses thyromegaly or lymphadenopathy noted. Cardiac exam with regular rhythm normal S1 and S2.  Murmur gallop noted. Auscultation lungs we will vesicular breath sounds bilaterally. Abdomen is full but soft and nontender with organomegaly or masses. No peripheral edema noted.  Labs/studies Results:   CBC Latest Ref Rng & Units 01/17/2021 01/16/2021 01/15/2021  WBC 4.0 - 10.5 K/uL 2.8(L) 2.5(L) 1.9(L)  Hemoglobin 12.0 - 15.0 g/dL 9.2(L) 7.6(L) 7.7(L)  Hematocrit 36.0 - 46.0 % 27.4(L) 23.9(L) 23.4(L)  Platelets 150 - 400 K/uL 259 219 213    CMP Latest Ref Rng & Units 01/17/2021 01/16/2021 01/15/2021  Glucose 70 - 99 mg/dL 98 163(H) 126(H)  BUN 8 - 23 mg/dL $Remove'13 18 18  'zSJHEvG$ Creatinine 0.44 - 1.00 mg/dL 1.02(H) 1.13(H) 1.05(H)  Sodium 135 - 145 mmol/L 134(L) 131(L) 131(L)  Potassium 3.5 - 5.1 mmol/L 4.2 3.9 3.4(L)  Chloride 98 - 111 mmol/L 105 105 102  CO2 22 - 32 mmol/L 23 21(L) 20(L)  Calcium 8.9 - 10.3 mg/dL 7.6(L) 7.4(L) 7.3(L)  Total Protein 6.5 - 8.1 g/dL - 5.9(L) 6.3(L)  Total Bilirubin 0.3 - 1.2 mg/dL - 0.7 0.9  Alkaline Phos 38 - 126  U/L - 36(L) 37(L)  AST 15 - 41 U/L - 17 17  ALT 0 - 44 U/L - 10 10    Hepatic Function Latest Ref Rng & Units 01/16/2021 01/15/2021 01/02/2021  Total Protein 6.5 - 8.1 g/dL 5.9(L) 6.3(L) 7.3  Albumin 3.5 - 5.0 g/dL 2.2(L) 2.4(L) 3.2(L)  AST 15 - 41 U/L $Remo'17 17 21  'barYW$ ALT 0 - 44 U/L $Remo'10 10 12  'SIZco$ Alk Phosphatase 38 - 126 U/L 36(L) 37(L) 60  Total Bilirubin 0.3 - 1.2 mg/dL 0.7 0.9 0.6      Assessment:  #1.  Anemia secondary GI bleed.  Hemoglobin has improved with transfusion.  No evidence of active bleeding.  #2.  Influenza A.  Patient does not have any symptoms.  There was a question of infiltrate on chest x-ray.  Patient is on Tamiflu.  #4.  Leukopenia.  Remains to be seen if it is due to viral illness or other etiologies.  #5.  Chronic congestive heart failure.  He appears to have diastolic dysfunction.  Echo results from   Plan:  Proceed with diagnostic esophagogastroduodenoscopy.

## 2021-01-17 NOTE — Transfer of Care (Signed)
Immediate Anesthesia Transfer of Care Note  Patient: Mackenzie [name]  Procedure(s) Performed: ESOPHAGOGASTRODUODENOSCOPY (EGD) WITH PROPOFOL  Patient Location: PACU  Anesthesia Type:General  Level of Consciousness: drowsy  Airway & Oxygen Therapy: Patient Spontanous Breathing  Post-op Assessment: Report given to RN and Post -op Vital signs reviewed and stable  Post vital signs: Reviewed and stable  Last Vitals:  Vitals Value Taken Time  BP    Temp    Pulse    Resp 31 01/17/21 1555  SpO2    Vitals shown include unvalidated device data.  Last Pain:  Vitals:   01/17/21 1334  TempSrc:   PainSc: 0-No pain         Complications: No notable events documented.

## 2021-01-17 NOTE — Op Note (Signed)
[facility] Patient Name: Mackenzie [name] Procedure Date: 01/17/2021 3:27 PM MRN: [id number] Date of Birth: September 19, 1950 Attending MD: Hildred Laser , MD CSN: [id number] Age: 70 Admit Type: Inpatient Procedure:                Upper GI endoscopy Indications:              Iron deficiency anemia, Melena Providers:                Hildred Laser, MD, Gwynneth Albright RN, RN,                            Randa Spike, Technician Referring MD:             Kathie Dike, MD Medicines:                Propofol per Anesthesia Complications:            No immediate complications. Estimated Blood Loss:     Estimated blood loss: none. Procedure:                Pre-Anesthesia Assessment:                           - Prior to the procedure, a History and Physical                            was performed, and patient medications and                            allergies were reviewed. The patient's tolerance of                            previous anesthesia was also reviewed. The risks                            and benefits of the procedure and the sedation                            options and risks were discussed with the patient.                            All questions were answered, and informed consent                            was obtained. Prior Anticoagulants: The patient                            last took Eliquis (apixaban) 2 days prior to the                            procedure. ASA Grade Assessment: III - A patient                            with severe systemic disease. After reviewing the  risks and benefits, the patient was deemed in                            satisfactory condition to undergo the procedure.                           After obtaining informed consent, the endoscope was                            passed under direct vision. Throughout the                            procedure, the patient's blood pressure, pulse, and                             oxygen saturations were monitored continuously. The                            [id number]-[id number] ([id number]) scope was introduced through the                            mouth, and advanced to the second part of duodenum.                            The upper GI endoscopy was accomplished without                            difficulty. The patient tolerated the procedure                            well. Scope In: 3:42:13 PM Scope Out: 3:50:53 PM Total Procedure Duration: 0 hours 8 minutes 40 seconds  Findings:      The hypopharynx was normal.      The examined esophagus was normal.      The Z-line was regular and was found 37 cm from the incisors.      A 2 cm hiatal hernia was present.      Localized mildly congested mucosa was found in the prepyloric region of       the stomach.      The exam of the stomach was otherwise normal.      Punctate pigmentation noted to mucosa of bulb and second part of the       duodenum. Impression:               - Normal hypopharynx.                           - Normal esophagus.                           - Z-line regular, 37 cm from the incisors.                           - 2 cm hiatal hernia.                           -  Congestive gastropathy.                           - hematobulbar and post bulbar mucosa with                            hemosiderosis but no evidence of peptic ulcer                            diseaseIs Dr. Jenetta Downer done                           - No specimens collected. Moderate Sedation:      Per Anesthesia Care Recommendation:           - Return patient to hospital ward for ongoing care.                           - Low sodium diet today.                           - Continue present medications.                           - Perform a colonoscopy on 1202/2022. Procedure Code(s):        --- Professional ---                           [phone], Esophagogastroduodenoscopy, flexible,                            transoral; diagnostic, including  collection of                            specimen(s) by brushing or washing, when performed                            (separate procedure) Diagnosis Code(s):        --- Professional ---                           K44.9, Diaphragmatic hernia without obstruction or                            gangrene                           K31.89, Other diseases of stomach and duodenum                           D50.9, Iron deficiency anemia, unspecified                           K92.1, Melena (includes Hematochezia) CPT copyright 2019 American Medical Association. All rights reserved. The codes documented in this report are preliminary and upon coder review may  be revised to meet current compliance requirements. Hildred Laser, MD Hildred Laser, MD 01/17/2021 4:04:37 PM This report has been signed  electronically. Number of Addenda: 0

## 2021-01-17 NOTE — Progress Notes (Signed)
Daughter present at bedside and will keep patient NPO starting now according to gastro note for possible EGD today.

## 2021-01-18 ENCOUNTER — Encounter (HOSPITAL_COMMUNITY): Payer: Self-pay | Admitting: Internal Medicine

## 2021-01-18 DIAGNOSIS — K921 Melena: Secondary | ICD-10-CM

## 2021-01-18 DIAGNOSIS — D649 Anemia, unspecified: Secondary | ICD-10-CM | POA: Diagnosis not present

## 2021-01-18 LAB — GLUCOSE, CAPILLARY
Glucose-Capillary: 90 mg/dL (ref 70–99)
Glucose-Capillary: 135 mg/dL — ABNORMAL HIGH (ref 70–99)
Glucose-Capillary: 97 mg/dL (ref 70–99)
Glucose-Capillary: 133 mg/dL — ABNORMAL HIGH (ref 70–99)

## 2021-01-18 MED ORDER — BISACODYL 5 MG PO TBEC
10.0000 mg | DELAYED_RELEASE_TABLET | Freq: Once | ORAL | Status: AC
  Administered 2021-01-18: 10 mg via ORAL
  Filled 2021-01-18: qty 2

## 2021-01-18 MED ORDER — PEG 3350-KCL-NA BICARB-NACL 420 G PO SOLR
4000.0000 mL | Freq: Once | ORAL | Status: AC
  Administered 2021-01-18: 4000 mL via ORAL

## 2021-01-18 NOTE — Progress Notes (Signed)
PROGRESS NOTE    Mackenzie [name]  HCW:[id number] DOB: [birth year] DOA: 01/15/2021 PCP: [facility] Personal    Brief Narrative:  70 year old female with a history of atrial fibrillation on anticoagulation, systolic CHF, diabetes, presented to the emergency room after having a syncopal episodes.  She reports having dark tarry stools recently.  FOBT was found to be positive.  CBC showed hemoglobin of 7.0, down from over 9 approximately 2 weeks ago.  She was admitted for further evaluation including PRBC transfusion and GI consultation.   Assessment & Plan:   Principal Problem:   Symptomatic anemia Active Problems:   Chronic systolic heart failure (HCC)   HTN (hypertension)   Syncope   Influenza A   Atrial fibrillation (HCC)   Pneumonia due to influenza   Dark stools   Syncope -At this point, I suspect that her syncope would be related to anemia and hypovolemia -Continue to monitor on telemetry for any arrhythmias -Updated echocardiogram since last study was done in 2018; demonstrating preserved ejection fraction 60 to 65%, no wall motion abnormalities, moderate asymmetric left ventricular hypertrophy of the basal septal segment and grade 2 diastolic dysfunction.  No significant valvular abnormalities  Acute blood loss anemia -Baseline hemoglobin is unclear, but hemoglobin was 9.8 approximately 2 weeks ago -Admission hemoglobin noted to be 7.0 -She received 2 units of PRBC with improvement of hemoglobin to 9.2; will repeat CBC in AM. -Given cardiac history transfusion threshold is less than 7.5. -Patient also found to be B12 deficient; continue intramuscular repletion.  GI bleeding -Reports having recent dark stools -FOBT positive -Continue PPI -GI consultation appreciated; endoscopic evaluation unrevealing of bleeding source.  Per recommendations diet will be advanced and anticipated colonoscopy intervention planned for 01-19-21.  -Bowel prep starting  today; liquid diet and n.p.o. after midnight per GI service recommendations.  Influenza A pneumonia -Currently she is not hypoxic -Reported no shortness of breath and just expressing mild URI symptoms. -Chest x-ray indicated atypical infection involving upper and mid lungs -Continue treatment with Tamiflu and continue supportive care.  Leukopenia -Suspect this is secondary to underlying viral process -Continue to follow WBCs trend. -Repeat CBC in AM.  Chronic systolic and congestive heart failure -Currently appears to be euvolemic -Repeat echo demonstrating normalization of her ejection fraction at this time.  Grade 2 diastolic dysfunction appreciated. -Follow daily weights and strict I's and O's. -She is continued on Entresto, carvedilol, Lasix, spironolactone and hydralazine -Will hold for soft blood pressures -Followed by cardiology, Dr. Humphrey Rolls at [facility]  Chronic atrial fibrillation -Heart rate is currently stable on Coreg -Chronically on Eliquis which was held on admission due to concerns for acute GI bleeding. -Follow-up GI stable recommendation for when is safe to resume anticoagulation.  Type 2 diabetes mellitus diabetes -Chronically on metformin -A1c 5.8 (demonstrating a stable condition and reaching prediabetes level at this time) -Continue on sliding scale insulin while inpatient. -Resume the use of metformin at discharge.  Class I obesity -Body mass index is 32.28 kg/m. -Low calorie diet, portion control and increase physical activity discussed with patient.  Essential hypertension -Blood pressure soft in the setting of hypovolemia and acute GI bleed -Antihypertensive agent has been adjusted -Follow vital signs   DVT prophylaxis: SCDs Start: 01/15/21 1658  Code Status: full code Family Communication: discussed with daughter at the bedside Disposition Plan: Status is: Inpatient  Remains inpatient appropriate because: Continued work-up for GI  bleeding    Consultants:  Gastroenterology  Procedures:  Echo  Antimicrobials:  Tamiflu 11/28 > planning to treat for 5 days.   Subjective: No dizziness, no lightheadedness, no chest pain, no shortness of breath.  Patient denies palpitations and is currently afebrile.  No overt bleeding reported.  Objective: Vitals:   01/17/21 2340 01/18/21 0433 01/18/21 1000 01/18/21 1240  BP: 115/67 (!) 127/56 122/73 (!) 96/49  Pulse: 72 81  75  Resp: 18 19  20   Temp: 99.7 F (37.6 C) 100.2 F (37.9 C)  98.5 F (36.9 C)  TempSrc: Oral Oral  Oral  SpO2: 99% 94%  99%  Weight:      Height:        Intake/Output Summary (Last 24 hours) at 01/18/2021 1521 Last data filed at 01/18/2021 1300 Gross per 24 hour  Intake 1417.8 ml  Output --  Net 1417.8 ml   Filed Weights   01/15/21 1345  Weight: 90.7 kg    Examination: General exam: Alert, awake, oriented x 3; no overt bleeding.  Patient is presently tolerating and denies abdominal pain, chest pain, shortness of breath, or palpitations. Respiratory system: Clear to auscultation. Respiratory effort normal.  No using accessory muscles.  Good saturation on room air. Cardiovascular system: Rate controlled, no rubs, no gallops, no JVD. Gastrointestinal system: Abdomen is obese, nondistended, soft and nontender. No organomegaly or masses felt. Normal bowel sounds heard. Central nervous system: Alert and oriented. No focal neurological deficits. Extremities: No cyanosis or clubbing. Skin: No petechiae. Psychiatry: Judgement and insight appear normal. Mood & affect appropriate.   Data Reviewed: I have personally reviewed following labs and imaging studies  CBC: Recent Labs  Lab 01/15/21 1458 01/15/21 2354 01/15/21 2356 01/16/21 0946 01/17/21 0449  WBC 2.7*  --  1.9* 2.5* 2.8*  NEUTROABS 1.8  --   --   --   --   HGB 7.0* 7.7* 7.7* 7.6* 9.2*  HCT 21.7* 23.3* 23.4* 23.9* 27.4*  MCV 87.5  --  86.7 86.9 86.4  PLT 243  --  213 219 825    Basic Metabolic Panel: Recent Labs  Lab 01/15/21 1458 01/16/21 0946 01/17/21 0449  NA 131* 131* 134*  K 3.4* 3.9 4.2  CL 102 105 105  CO2 20* 21* 23  GLUCOSE 126* 163* 98  BUN 18 18 13   CREATININE 1.05* 1.13* 1.02*  CALCIUM 7.3* 7.4* 7.6*  MG 1.6*  --  2.4  PHOS  --  2.0*  --    GFR: Estimated Creatinine Clearance: 58.3 mL/min (A) (by C-G formula based on SCr of 1.02 mg/dL (H)).  Liver Function Tests: Recent Labs  Lab 01/15/21 1458 01/16/21 0946  AST 17 17  ALT 10 10  ALKPHOS 37* 36*  BILITOT 0.9 0.7  PROT 6.3* 5.9*  ALBUMIN 2.4* 2.2*   HbA1C: Recent Labs    01/15/21 2354  HGBA1C 5.8*   CBG: Recent Labs  Lab 01/17/21 1534 01/17/21 1601 01/17/21 2142 01/18/21 0703 01/18/21 1105  GLUCAP 87 86 103* 90 135*   Sepsis Labs: Recent Labs  Lab 01/15/21 1458  PROCALCITON <0.10    Recent Results (from the past 240 hour(s))  Resp Panel by RT-PCR (Flu A&B, Covid) Nasopharyngeal Swab     Status: Abnormal   Collection Time: 01/15/21  3:26 PM   Specimen: Nasopharyngeal Swab; Nasopharyngeal(NP) swabs in vial transport medium  Result Value Ref Range Status   SARS Coronavirus 2 by RT PCR NEGATIVE NEGATIVE Final    Comment: (NOTE) SARS-CoV-2 target nucleic acids are NOT DETECTED.  The SARS-CoV-2 RNA is  generally detectable in upper respiratory specimens during the acute phase of infection. The lowest concentration of SARS-CoV-2 viral copies this assay can detect is 138 copies/mL. A negative result does not preclude SARS-Cov-2 infection and should not be used as the sole basis for treatment or other patient management decisions. A negative result may occur with  improper specimen collection/handling, submission of specimen other than nasopharyngeal swab, presence of viral mutation(s) within the areas targeted by this assay, and inadequate number of viral copies(<138 copies/mL). A negative result must be combined with clinical observations, patient history, and  epidemiological information. The expected result is Negative.  Fact Sheet for Patients:  EntrepreneurPulse.com.au  Fact Sheet for Healthcare Providers:  IncredibleEmployment.be  This test is no t yet approved or cleared by the [place] FDA and  has been authorized for detection and/or diagnosis of SARS-CoV-2 by FDA under an Emergency Use Authorization (EUA). This EUA will remain  in effect (meaning this test can be used) for the duration of the COVID-19 declaration under Section 564(b)(1) of the Act, 21 U.S.C.section 360bbb-3(b)(1), unless the authorization is terminated  or revoked sooner.       Influenza A by PCR POSITIVE (A) NEGATIVE Final   Influenza B by PCR NEGATIVE NEGATIVE Final    Comment: (NOTE) The Xpert Xpress SARS-CoV-2/FLU/RSV plus assay is intended as an aid in the diagnosis of influenza from Nasopharyngeal swab specimens and should not be used as a sole basis for treatment. Nasal washings and aspirates are unacceptable for Xpert Xpress SARS-CoV-2/FLU/RSV testing.  Fact Sheet for Patients: EntrepreneurPulse.com.au  Fact Sheet for Healthcare Providers: IncredibleEmployment.be  This test is not yet approved or cleared by the [place] FDA and has been authorized for detection and/or diagnosis of SARS-CoV-2 by FDA under an Emergency Use Authorization (EUA). This EUA will remain in effect (meaning this test can be used) for the duration of the COVID-19 declaration under Section 564(b)(1) of the Act, 21 U.S.C. section 360bbb-3(b)(1), unless the authorization is terminated or revoked.  Performed at [facility], [address] [place], [place] [address]      Radiology Studies: No results found.   Scheduled Meds:  amLODipine  5 mg Oral Daily   atorvastatin  40 mg Oral q1800   bisacodyl  10 mg Oral Once   bisacodyl  10 mg Oral Once   carvedilol  12.5 mg Oral BID WC    cyanocobalamin  1,000 mcg Intramuscular Daily   furosemide  20 mg Oral Daily   hydrALAZINE  75 mg Oral TID   insulin aspart  0-9 Units Subcutaneous TID WC   oseltamivir  30 mg Oral BID   pantoprazole (PROTONIX) IV  40 mg Intravenous Q12H   polyethylene glycol-electrolytes  4,000 mL Oral Once   sacubitril-valsartan  1 tablet Oral BID   spironolactone  12.5 mg Oral Daily   Continuous Infusions:     LOS: 3 days    Time spent: 30 mins    Barton Dubois, MD [facility]   If 7PM-7AM, please contact night-coverage [email or web address]  01/18/2021, 3:21 PM

## 2021-01-18 NOTE — Progress Notes (Signed)
Patient and daughter at bedside , patient had 1 episode of diarrhea d/t prep. She has consumed 2-3 cups of nulytely. Educated that she needs to drink as much as she can as Surgeon will need her BM to be clear, verbalized understanding however her daughter states " she has only had roast beef to eat since yesterday , the rest was clear". They do understand if her BM is not clear tomorrow that Physician may decline to do procedure. Patient did vomit x 1 , refused zofran . States it may be from nulytely. Report given to oncoming RN to encourage patient to drink , and that she had been educated on importance of her stool being clear

## 2021-01-18 NOTE — Progress Notes (Signed)
Subjective:  Two BMs yesterday. Denies abdominal pain. Appetite improved. Enjoyed her meal last night. No SOB. Slight cough during night but better.   Objective: Vital signs in last 24 hours: Temp:  [97.9 F (36.6 C)-100.3 F (37.9 C)] 100.2 F (37.9 C) (12/01 0433) Pulse Rate:  [72-85] 81 (12/01 0433) Resp:  [16-27] 19 (12/01 0433) BP: (93-127)/(54-67) 127/56 (12/01 0433) SpO2:  [93 %-99 %] 94 % (12/01 0433) Last BM Date: 01/16/21 General:   Alert,  Well-developed, well-nourished, pleasant and cooperative in NAD Head:  Normocephalic and atraumatic. Eyes:  Sclera clear, no icterus.  Chest: CTA bilaterally without rales, rhonchi, crackles.    Heart:  Regular rate and rhythm; no murmurs, clicks, rubs,  or gallops. Abdomen:  Soft, nontender and nondistended.   Normal bowel sounds, without guarding, and without rebound.   Extremities:  Without clubbing, deformity or edema. Neurologic:  Alert and  oriented x4;  grossly normal neurologically. Skin:  Intact without significant lesions or rashes. Psych:  Alert and cooperative. Normal mood and affect.  Intake/Output from previous day: 11/30 0701 - 12/01 0700 In: 937.8 [P.O.:720; I.V.:217.8] Out: -  Intake/Output this shift: No intake/output data recorded.  Lab Results: CBC Recent Labs    01/15/21 2356 01/16/21 0946 01/17/21 0449  WBC 1.9* 2.5* 2.8*  HGB 7.7* 7.6* 9.2*  HCT 23.4* 23.9* 27.4*  MCV 86.7 86.9 86.4  PLT 213 219 259   BMET Recent Labs    01/15/21 1458 01/16/21 0946 01/17/21 0449  NA 131* 131* 134*  K 3.4* 3.9 4.2  CL 102 105 105  CO2 20* 21* 23  GLUCOSE 126* 163* 98  BUN 18 18 13   CREATININE 1.05* 1.13* 1.02*  CALCIUM 7.3* 7.4* 7.6*   LFTs Recent Labs    01/15/21 1458 01/16/21 0946  BILITOT 0.9 0.7  ALKPHOS 37* 36*  AST 17 17  ALT 10 10  PROT 6.3* 5.9*  ALBUMIN 2.4* 2.2*   No results for input(s): LIPASE in the last 72 hours. PT/INR No results for input(s): LABPROT, INR in the last 72  hours.    Imaging Studies: DG Chest Port 1 View  Result Date: 01/15/2021 CLINICAL DATA:  Near syncopal episode in flu like symptoms. Hemoglobin of 6.9. History of CHF with cough for 1 week. EXAM: PORTABLE CHEST 1 VIEW COMPARISON:  March of 2018. FINDINGS: EKG leads project over the chest. Cardiac enlargement and fullness of the RIGHT and LEFT hilum is a stable finding based on comparison with previous imaging. There is no lobar consolidative process. No sign of pleural effusion on frontal radiograph or pneumothorax. Subtle opacities scattered throughout the upper and mid chest bilaterally suggested on the current study. On limited assessment there is no acute skeletal process. IMPRESSION: Cardiomegaly with central pulmonary vascular engorgement. Subtle opacities scattered throughout the upper and mid chest bilaterally, findings could represent asymmetric edema versus is atypical infection, consider follow-up PA and lateral chest radiograph when the patient is able to ensure resolution. Electronically Signed   By: Zetta Bills M.D.   On: 01/15/2021 15:35   ECHOCARDIOGRAM COMPLETE  Result Date: 01/16/2021    ECHOCARDIOGRAM REPORT   Patient Name:   Mackenzie [name] Date of Exam: 01/16/2021 Medical Rec #:  [id number]      Height:       66.0 in Accession #:    [id number]     Weight:       200.0 lb Date of Birth:  [birth year]       BSA:  2.000 m Patient Age:    70 years       BP:           119/61 mmHg Patient Gender: F              HR:           74 bpm. Exam Location:  [facility] Procedure: 2D Echo, Cardiac Doppler and Color Doppler Indications:    Syncope  History:        Patient has prior history of Echocardiogram examinations, most                 recent 05/01/2016. CHF, Arrythmias:Atrial Fibrillation,                 Signs/Symptoms:Syncope; Risk Factors:Hypertension.  Sonographer:    Wenda Low Referring Phys: [place]  1. Left ventricular ejection fraction, by  estimation, is 60 to 65%. The left ventricle has normal function. The left ventricle has no regional wall motion abnormalities. There is moderate asymmetric left ventricular hypertrophy of the basal-septal segment. Left ventricular diastolic parameters are consistent with Grade II diastolic dysfunction (pseudonormalization).  2. Right ventricular systolic function is normal. The right ventricular size is normal. There is mildly elevated pulmonary artery systolic pressure. The estimated right ventricular systolic pressure is 68.1 mmHg.  3. Left atrial size was moderately dilated.  4. The mitral valve is abnormal. Trivial mitral valve regurgitation.  5. The aortic valve is tricuspid. Aortic valve regurgitation is mild. Aortic valve sclerosis/calcification is present, without any evidence of aortic stenosis. There is mild calcification of the aortic valve leaflets most notably on the [place].  6. The inferior vena cava is normal in size with greater than 50% respiratory variability, suggesting right atrial pressure of 3 mmHg. Comparison(s): No prior Echocardiogram. FINDINGS  Left Ventricle: Left ventricular ejection fraction, by estimation, is 60 to 65%. The left ventricle has normal function. The left ventricle has no regional wall motion abnormalities. The left ventricular internal cavity size was normal in size. There is  moderate asymmetric left ventricular hypertrophy of the basal-septal segment. Left ventricular diastolic parameters are consistent with Grade II diastolic dysfunction (pseudonormalization). Right Ventricle: The right ventricular size is normal. No increase in right ventricular wall thickness. Right ventricular systolic function is normal. There is mildly elevated pulmonary artery systolic pressure. The tricuspid regurgitant velocity is 3.09  m/s, and with an assumed right atrial pressure of 3 mmHg, the estimated right ventricular systolic pressure is 15.7 mmHg. Left Atrium: Left atrial size was  moderately dilated. Right Atrium: Right atrial size was normal in size. Pericardium: There is no evidence of pericardial effusion. Mitral Valve: The mitral valve is abnormal. There is mild thickening of the mitral valve leaflet(s). There is mild calcification of the mitral valve leaflet(s). Mild to moderate mitral annular calcification. Trivial mitral valve regurgitation. MV peak gradient, 4.0 mmHg. The mean mitral valve gradient is 2.0 mmHg. Tricuspid Valve: The tricuspid valve is normal in structure. Tricuspid valve regurgitation is mild. Aortic Valve: There is calcifcation present most notably on the [place]. The aortic valve is tricuspid. Aortic valve regurgitation is mild. Aortic valve sclerosis/calcification is present, without any evidence of aortic stenosis. Aortic valve mean gradient measures 7.0 mmHg. Aortic valve peak gradient measures 12.8 mmHg. Aortic valve area, by VTI measures 1.57 cm. Pulmonic Valve: The pulmonic valve was normal in structure. Pulmonic valve regurgitation is mild. Aorta: The aortic root is normal in size and structure. Venous: The inferior vena  cava is normal in size with greater than 50% respiratory variability, suggesting right atrial pressure of 3 mmHg. IAS/Shunts: No atrial level shunt detected by color flow Doppler.  LEFT VENTRICLE PLAX 2D LVIDd:         5.40 cm     Diastology LVIDs:         3.20 cm     LV e' medial:    5.29 cm/s LV PW:         0.90 cm     LV E/e' medial:  19.8 LV IVS:        1.20 cm     LV e' lateral:   6.73 cm/s LVOT diam:     1.90 cm     LV E/e' lateral: 15.6 LV SV:         64 LV SV Index:   32 LVOT Area:     2.84 cm  LV Volumes (MOD) LV vol d, MOD A2C: 88.3 ml LV vol d, MOD A4C: 60.5 ml LV vol s, MOD A2C: 25.1 ml LV vol s, MOD A4C: 27.3 ml LV SV MOD A2C:     63.2 ml LV SV MOD A4C:     60.5 ml LV SV MOD BP:      47.8 ml RIGHT VENTRICLE RV Basal diam:  3.75 cm RV Mid diam:    3.80 cm RV S prime:     15.20 cm/s TAPSE (M-mode): 2.6 cm LEFT ATRIUM             Index         RIGHT ATRIUM           Index LA diam:        4.40 cm 2.20 cm/m   RA Area:     16.80 cm LA Vol (A2C):   89.5 ml 44.75 ml/m  RA Volume:   44.80 ml  22.40 ml/m LA Vol (A4C):   76.1 ml 38.05 ml/m LA Biplane Vol: 81.1 ml 40.55 ml/m  AORTIC VALVE                     PULMONIC VALVE AV Area (Vmax):    1.58 cm      PV Vmax:       1.20 m/s AV Area (Vmean):   1.59 cm      PV Peak grad:  5.8 mmHg AV Area (VTI):     1.57 cm AV Vmax:           179.00 cm/s AV Vmean:          120.000 cm/s AV VTI:            0.409 m AV Peak Grad:      12.8 mmHg AV Mean Grad:      7.0 mmHg LVOT Vmax:         100.00 cm/s LVOT Vmean:        67.200 cm/s LVOT VTI:          0.227 m LVOT/AV VTI ratio: 0.56  AORTA Ao Root diam: 3.40 cm MITRAL VALVE                TRICUSPID VALVE MV Area (PHT): 2.81 cm     TR Peak grad:   38.2 mmHg MV Area VTI:   1.97 cm     TR Vmax:        309.00 cm/s MV Peak grad:  4.0 mmHg MV Mean grad:  2.0 mmHg     SHUNTS  MV Vmax:       1.00 m/s     Systemic VTI:  0.23 m MV Vmean:      60.2 cm/s    Systemic Diam: 1.90 cm MV Decel Time: 270 msec MV E velocity: 105.00 cm/s MV A velocity: 66.70 cm/s MV E/A ratio:  1.57 Gwyndolyn Kaufman MD Electronically signed by Gwyndolyn Kaufman MD Signature Date/Time: 01/16/2021/6:13:09 PM    Final   [2 weeks]   Assessment:  70 y/o female with HTN, chronic heart failure, Afib on Eliquis presenting with low Hgb. Admitted with symptomatic anemia and influenza A with pneumonia.   Anemia with iron and B12 def: Hgb 7 on admission, down from 9.8 two weeks ago. Hgb had been normal in 2019. Received 2 units of prbcs and Hgb up to 9.2. Ferritin elevated at 376, likely APR. Iron 12, sat 9%, B12 176. Heme + stool. Reported melena off/on for few weeks. 50 pound weight loss over 2-3 years. No NSAIDs. ASA 81mg  daily. Compliant with Eliquis, last dose morning of 11/28. Last colonoscopy couple of years ago without polyps. 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   Influenza A: on Tamiflu. Stable on room air. Continues with low-grade temps.    Plan: B12 repletion per attending. Start bowel prep today, for colonoscopy tomorrow.    Laureen Ochs. Bernarda Caffey [facility] [phone] 12/1/202211:00 AM    LOS: 3 days

## 2021-01-18 NOTE — Progress Notes (Signed)
Patients daughter at bedside, did not want patient to take her hydralazine , norvasc,. States she thinks her mothers BP has been low and she doesn't want to make it go lower. Patient agreed. Will notify Dr. Dyann Kief

## 2021-01-19 ENCOUNTER — Encounter (HOSPITAL_COMMUNITY)
Admission: EM | Disposition: A | Discharge status: Home / Self Care | Payer: Self-pay | Source: Home / Self Care | Attending: Internal Medicine

## 2021-01-19 ENCOUNTER — Encounter (HOSPITAL_COMMUNITY): Payer: Self-pay | Admitting: Internal Medicine

## 2021-01-19 ENCOUNTER — Inpatient Hospital Stay (HOSPITAL_COMMUNITY): Payer: Medicare Other | Admitting: Anesthesiology

## 2021-01-19 DIAGNOSIS — D509 Iron deficiency anemia, unspecified: Secondary | ICD-10-CM

## 2021-01-19 HISTORY — PX: COLONOSCOPY WITH PROPOFOL: SHX5780

## 2021-01-19 LAB — CBC
HCT: 27.7 % — ABNORMAL LOW (ref 36.0–46.0)
Hemoglobin: 9 g/dL — ABNORMAL LOW (ref 12.0–15.0)
MCH: 28.8 pg (ref 26.0–34.0)
MCHC: 32.5 g/dL (ref 30.0–36.0)
MCV: 88.5 fL (ref 80.0–100.0)
Platelets: 256 10*3/uL (ref 150–400)
RBC: 3.13 MIL/uL — ABNORMAL LOW (ref 3.87–5.11)
RDW: 14.1 % (ref 11.5–15.5)
WBC: 2.1 10*3/uL — ABNORMAL LOW (ref 4.0–10.5)
nRBC: 0 % (ref 0.0–0.2)

## 2021-01-19 LAB — BASIC METABOLIC PANEL
Anion gap: 6 (ref 5–15)
BUN: 16 mg/dL (ref 8–23)
CO2: 24 mmol/L (ref 22–32)
Calcium: 7.9 mg/dL — ABNORMAL LOW (ref 8.9–10.3)
Chloride: 101 mmol/L (ref 98–111)
Creatinine, Ser: 1.29 mg/dL — ABNORMAL HIGH (ref 0.44–1.00)
GFR, Estimated: 45 mL/min — ABNORMAL LOW (ref >60)
Glucose, Bld: 93 mg/dL (ref 70–99)
Potassium: 4 mmol/L (ref 3.5–5.1)
Sodium: 131 mmol/L — ABNORMAL LOW (ref 135–145)

## 2021-01-19 LAB — GLUCOSE, CAPILLARY
Glucose-Capillary: 94 mg/dL (ref 70–99)
Glucose-Capillary: 83 mg/dL (ref 70–99)
Glucose-Capillary: 87 mg/dL (ref 70–99)
Glucose-Capillary: 101 mg/dL — ABNORMAL HIGH (ref 70–99)
Glucose-Capillary: 106 mg/dL — ABNORMAL HIGH (ref 70–99)

## 2021-01-19 SURGERY — COLONOSCOPY WITH PROPOFOL
Anesthesia: General

## 2021-01-19 MED ORDER — SODIUM CHLORIDE 0.9 % IV SOLN: INTRAVENOUS | Status: DC

## 2021-01-19 MED ORDER — INSULIN ASPART 100 UNIT/ML IJ SOLN
0.0000 [IU] | Freq: Every day | INTRAMUSCULAR | Status: DC
Start: 2021-01-19 — End: 2021-01-20

## 2021-01-19 MED ORDER — PROPOFOL 10 MG/ML IV BOLUS
INTRAVENOUS | Status: DC | PRN
  Administered 2021-01-19: 60 mg via INTRAVENOUS

## 2021-01-19 MED ORDER — LIDOCAINE HCL (CARDIAC) PF 100 MG/5ML IV SOSY
PREFILLED_SYRINGE | INTRAVENOUS | Status: DC | PRN
  Administered 2021-01-19: 50 mg via INTRAVENOUS

## 2021-01-19 MED ORDER — PROPOFOL 500 MG/50ML IV EMUL
INTRAVENOUS | Status: DC | PRN
  Administered 2021-01-19: 150 ug/kg/min via INTRAVENOUS

## 2021-01-19 MED ORDER — LACTATED RINGERS IV SOLN: INTRAVENOUS | Status: DC

## 2021-01-19 MED ORDER — DEXTROSE 50 % IV SOLN
INTRAVENOUS | Status: AC
  Filled 2021-01-19: qty 50

## 2021-01-19 MED ORDER — PROPOFOL 10 MG/ML IV BOLUS
INTRAVENOUS | Status: AC
  Filled 2021-01-19: qty 20

## 2021-01-19 MED ORDER — INSULIN ASPART 100 UNIT/ML IJ SOLN: 0.0000 [IU] | Freq: Three times a day (TID) | INTRAMUSCULAR | Status: DC

## 2021-01-19 MED ORDER — DEXTROSE 50 % IV SOLN
INTRAVENOUS | Status: DC | PRN
  Administered 2021-01-19: 25 mL via INTRAVENOUS

## 2021-01-19 MED ORDER — APIXABAN 5 MG PO TABS
5.0000 mg | ORAL_TABLET | Freq: Two times a day (BID) | ORAL | Status: DC
  Administered 2021-01-19 – 2021-01-20 (×2): 5 mg via ORAL
  Filled 2021-01-19 (×3): qty 1

## 2021-01-19 NOTE — Anesthesia Preprocedure Evaluation (Signed)
Anesthesia Evaluation  Patient identified by MRN, date of birth, ID band Patient awake    Reviewed: Allergy & Precautions, H&P , NPO status , Patient's Chart, lab work & pertinent test results, reviewed documented beta blocker date and time   Airway Mallampati: II  TM Distance: >3 FB Neck ROM: full    Dental no notable dental hx.    Pulmonary neg pulmonary ROS,    Pulmonary exam normal breath sounds clear to auscultation       Cardiovascular Exercise Tolerance: Good hypertension, negative cardio ROS   Rhythm:regular Rate:Normal     Neuro/Psych negative neurological ROS  negative psych ROS   GI/Hepatic negative GI ROS, Neg liver ROS,   Endo/Other  negative endocrine ROS  Renal/GU negative Renal ROS  negative genitourinary   Musculoskeletal   Abdominal   Peds  Hematology  (+) Blood dyscrasia, anemia ,   Anesthesia Other Findings   Reproductive/Obstetrics negative OB ROS                             Anesthesia Physical  Anesthesia Plan  ASA: 3 and emergent  Anesthesia Plan: General   Post-op Pain Management:    Induction:   PONV Risk Score and Plan: Propofol infusion  Airway Management Planned:   Additional Equipment:   Intra-op Plan:   Post-operative Plan:   Informed Consent: I have reviewed the patients History and Physical, chart, labs and discussed the procedure including the risks, benefits and alternatives for the proposed anesthesia with the patient or authorized representative who has indicated his/her understanding and acceptance.     Dental Advisory Given  Plan Discussed with: CRNA  Anesthesia Plan Comments:         Anesthesia Quick Evaluation

## 2021-01-19 NOTE — Progress Notes (Signed)
Spoke with patient's daughter this morning at bedside.  She reports her mom drank about one half of her colon prep yesterday.  She had multiple bowel movements.  The first bowel movement was light brown water, the bowel movements after that were yellow to clear.  She did have 1 bowel movement with possibly some red color to it.  Tapwater enemas this morning with clear output.  Hemoglobin stable today at 9.0 compared to 9.2 yesterday.  Sodium slightly low at 131, potassium 4.0, creatinine slightly bumped at 1.29.   We will plan to proceed with colonoscopy today as scheduled.  Aliene Altes, PA-C [facility] 01/15/2021

## 2021-01-19 NOTE — Progress Notes (Signed)
We will proceed with colonoscopy as scheduled.  I thoroughly discussed with the patient his procedure, including the risks involved. Patient understands what the procedure involves including the benefits and any risks. Patient understands alternatives to the proposed procedure. Risks including (but not limited to) bleeding, tearing of the lining (perforation), rupture of adjacent organs, problems with heart and lung function, infection, and medication reactions. A small percentage of complications may require surgery, hospitalization, repeat endoscopic procedure, and/or transfusion.  Patient understood and agreed.  Aldonia Keeven [name], MD Gastroenterology and Hepatology [facility]  

## 2021-01-19 NOTE — Brief Op Note (Signed)
01/15/2021 - 01/19/2021  1:41 PM  PATIENT:  Mackenzie [name]  70 y.o. female  PRE-OPERATIVE DIAGNOSIS:  IDA, symptomatic anemia, melena, weight loss, heme + stool  POST-OPERATIVE DIAGNOSIS:  Hemorroids;   PROCEDURE:  Procedure(s): COLONOSCOPY WITH PROPOFOL (N/A)  SURGEON:  Surgeon(s) and Role:    * Harvel Quale, MD - Primary  Patient underwent colonoscopy under propofol sedation.  Tolerated the procedure adequately.  Prep was adequate to identify polyps larger than 6 mm. Hemorrhoids were found on perianal exam.  The terminal ileum appeared normal.  Upon careful inspection, no presence of hematin or active bleeding was present. The colon (entire examined portion) appeared normal.  Upon careful inspection, no presence of hematin or active bleeding was present.  RECOMMENDATIONS - Return patient to hospital ward for ongoing care.  - Soft diet.  - Repeat colonoscopy in 5 years for screening purposes.  - If stable hemoglobin tomorrow, can do outpatient capsule endoscopy.  Maylon Peppers, MD Gastroenterology and Hepatology [facility]

## 2021-01-19 NOTE — Progress Notes (Signed)
Pt only able to drink approx half of ordered nulytely before 0000. Pt was encouraged during this time to drink as much as possible in order to clear stool and remove residue that may hamper colonoscopy study. Pt and pt's daughter report that she has had poor PO intake and nausea over the last 24 hours and her stool has been clear. Stool on inspection @ 2300 did appear mostly clear. 2x tap water enema completed this AM w/ clear stool observed.

## 2021-01-19 NOTE — Anesthesia Postprocedure Evaluation (Signed)
Anesthesia Post Note  Patient: Mackenzie [name]  Procedure(s) Performed: COLONOSCOPY WITH PROPOFOL  Patient location during evaluation: Phase II Anesthesia Type: General Level of consciousness: awake Pain management: pain level controlled Vital Signs Assessment: post-procedure vital signs reviewed and stable Respiratory status: spontaneous breathing and respiratory function stable Cardiovascular status: blood pressure returned to baseline and stable Postop Assessment: no headache and no apparent nausea or vomiting Anesthetic complications: no Comments: Late entry   No notable events documented.   Last Vitals:  Vitals:   01/19/21 1400 01/19/21 1419  BP: (!) 114/59 (!) 119/58  Pulse: 73 77  Resp: 16 20  Temp:  36.9 C  SpO2: 98% 99%    Last Pain:  Vitals:   01/19/21 1419  TempSrc: Oral  PainSc:                  Louann Sjogren

## 2021-01-19 NOTE — Care Management Important Message (Signed)
Important Message  Patient Details  Name: Mackenzie [name] MRN: [id number] Date of Birth: 1951-02-10   Medicare Important Message Given:  Yes     [name] 01/19/2021, 4:22 PM

## 2021-01-19 NOTE — Op Note (Signed)
[facility] Patient Name: Mackenzie [name] Procedure Date: 01/19/2021 12:56 PM MRN: [id number] Date of Birth: 08-20-1950 Attending MD: Maylon Peppers ,  CSN: [id number] Age: 70 Admit Type: Outpatient Procedure:                Colonoscopy Indications:              Iron deficiency anemia Providers:                Maylon Peppers, Janeece Riggers, RN, Raphael Gibney,                            Technician Referring MD:              Medicines:                Monitored Anesthesia Care Complications:            No immediate complications. Estimated Blood Loss:     Estimated blood loss: none. Procedure:                Pre-Anesthesia Assessment:                           - Prior to the procedure, a History and Physical                            was performed, and patient medications, allergies                            and sensitivities were reviewed. The patient's                            tolerance of previous anesthesia was reviewed.                           - The risks and benefits of the procedure and the                            sedation options and risks were discussed with the                            patient. All questions were answered and informed                            consent was obtained.                           - ASA Grade Assessment: III - A patient with severe                            systemic disease.                           After obtaining informed consent, the colonoscope                            was passed under direct vision. Throughout the  procedure, the patient's blood pressure, pulse, and                            oxygen saturations were monitored continuously. The                            [id number]-HQ190L ([id number]) scope was introduced through                            the anus and advanced to the the terminal ileum.                            The colonoscopy was performed without difficulty.                            The  patient tolerated the procedure well. The                            quality of the bowel preparation was adequate to                            identify polyps 6 mm and larger in size. Scope In: 1:16:54 PM Scope Out: 1:36:45 PM Scope Withdrawal Time: 0 hours 16 minutes 18 seconds  Total Procedure Duration: 0 hours 19 minutes 51 seconds  Findings:      Hemorrhoids were found on perianal exam.      The terminal ileum appeared normal. Upon careful inspection, no presence       of hematin or active bleeding was present.      The colon (entire examined portion) appeared normal. Upon careful       inspection, no presence of hematin or active bleeding was present.      Non-bleeding external hemorrhoids were found during retroflexion and       during perianal exam. The hemorrhoids were medium-sized. Impression:               - Hemorrhoids found on perianal exam.                           - The examined portion of the ileum was normal.                           - The entire examined colon is normal.                           - Non-bleeding external hemorrhoids.                           - No specimens collected. Moderate Sedation:      Per Anesthesia Care Recommendation:           - Return patient to hospital ward for ongoing care.                           - Soft diet.                           -  Repeat colonoscopy in 5 years for screening                            purposes.                           - If stable hemoglobin tomorrow, can do outpatient                            capsule endoscopy. Procedure Code(s):        --- Professional ---                           [phone], Colonoscopy, flexible; diagnostic, including                            collection of specimen(s) by brushing or washing,                            when performed (separate procedure) Diagnosis Code(s):        --- Professional ---                           K64.4, Residual hemorrhoidal skin tags                            D50.9, Iron deficiency anemia, unspecified CPT copyright 2019 [employer]. All rights reserved. The codes documented in this report are preliminary and upon coder review may  be revised to meet current compliance requirements. Maylon Peppers, MD Maylon Peppers,  01/19/2021 1:47:11 PM This report has been signed electronically. Number of Addenda: 0

## 2021-01-19 NOTE — Progress Notes (Signed)
PROGRESS NOTE    Mackenzie [name]  QRF:[id number] DOB: [birth year] DOA: 01/15/2021 PCP: [facility] Dept Personal   Brief Narrative:   70 year old female with a history of atrial fibrillation on anticoagulation, systolic CHF, diabetes, presented to the emergency room after having a syncopal episodes.  She reports having dark tarry stools recently.  FOBT was found to be positive.  CBC showed hemoglobin of 7.0, down from over 9 approximately 2 weeks ago.  She was admitted for further evaluation including PRBC transfusion and GI consultation.  Assessment & Plan:   Principal Problem:   Symptomatic anemia Active Problems:   Chronic systolic heart failure (HCC)   HTN (hypertension)   Syncope   Influenza A   Atrial fibrillation (HCC)   Pneumonia due to influenza   Dark stools   Melena   Syncope -At this point, I suspect that her syncope would be related to anemia and hypovolemia -Continue to monitor on telemetry for any arrhythmias -Updated echocardiogram since last study was done in 2018; demonstrating preserved ejection fraction 60 to 65%, no wall motion abnormalities, moderate asymmetric left ventricular hypertrophy of the basal septal segment and grade 2 diastolic dysfunction.  No significant valvular abnormalities   Acute blood loss anemia -Baseline hemoglobin is unclear, but hemoglobin was 9.8 approximately 2 weeks ago -Admission hemoglobin noted to be 7.0 -She received 2 units of PRBC with improvement of hemoglobin to 9.2; will repeat CBC in AM. -Given cardiac history transfusion threshold is less than 7.5. -Patient also found to be B12 deficient; continue intramuscular repletion.   GI bleeding -Reports having recent dark stools -FOBT positive -Continue PPI -GI consultation appreciated; endoscopic evaluation unrevealing of bleeding source and colonoscopy without any bleeding source identified -Resume Eliquis 12/2 and reassess CBC in a.m., if stable may be  considered for discharge with outpatient capsule endoscopy   Influenza A pneumonia -Currently she is not hypoxic -Reported no shortness of breath and just expressing mild URI symptoms. -Chest x-ray indicated atypical infection involving upper and mid lungs -Continue treatment with Tamiflu and continue supportive care.   Leukopenia-stable -Suspect this is secondary to underlying viral process -Continue to follow WBCs trend. -Repeat CBC in AM.   Chronic systolic and congestive heart failure -Currently appears to be euvolemic -Repeat echo demonstrating normalization of her ejection fraction at this time.  Grade 2 diastolic dysfunction appreciated. -Follow daily weights and strict I's and O's. -She is continued on Entresto, carvedilol, Lasix, spironolactone and hydralazine -Will hold for soft blood pressures -Followed by cardiology, Dr. Humphrey Rolls at [facility]   Chronic atrial fibrillation -Heart rate is currently stable on Coreg -Eliquis to be resumed this evening -Follow-up GI stable recommendation for when is safe to resume anticoagulation.   Type 2 diabetes mellitus diabetes -Chronically on metformin -A1c 5.8 (demonstrating a stable condition and reaching prediabetes level at this time) -Continue on sliding scale insulin while inpatient. -Resume the use of metformin at discharge.   Class I obesity -Body mass index is 32.28 kg/m. -Low calorie diet, portion control and increase physical activity discussed with patient.   Essential hypertension -Blood pressure soft in the setting of hypovolemia and acute GI bleed -Antihypertensive agent has been adjusted -Follow vital signs     DVT prophylaxis: Eliquis   Code Status: full code Family Communication: discussed with daughter at the bedside Disposition Plan: Status is: Inpatient   Remains inpatient appropriate because: Continued work-up for GI bleeding       Consultants:  Gastroenterology   Procedures:  Echo    Antimicrobials:  Tamiflu 11/28 > planning to treat for 5 days.  Subjective: Patient seen and evaluated today with no new acute complaints or concerns. No acute concerns or events noted overnight.  She has undergone colon prep for colonoscopy today.  Objective: Vitals:   01/19/21 1345 01/19/21 1356 01/19/21 1400 01/19/21 1419  BP: (!) 74/46 (!) 112/58 (!) 114/59 (!) 119/58  Pulse: 73  73 77  Resp: (!) 29 20 16 20   Temp:    98.4 F (36.9 C)  TempSrc:    Oral  SpO2: 100%  98% 99%  Weight:      Height:        Intake/Output Summary (Last 24 hours) at 01/19/2021 1518 Last data filed at 01/19/2021 1335 Gross per 24 hour  Intake 580 ml  Output 0 ml  Net 580 ml   Filed Weights   01/15/21 1345  Weight: 90.7 kg    Examination:  General exam: Appears calm and comfortable  Respiratory system: Clear to auscultation. Respiratory effort normal. Cardiovascular system: S1 & S2 heard, RRR.  Gastrointestinal system: Abdomen is soft Central nervous system: Alert and awake Extremities: No edema Skin: No significant lesions noted Psychiatry: Flat affect.    Data Reviewed: I have personally reviewed following labs and imaging studies  CBC: Recent Labs  Lab 01/15/21 1458 01/15/21 2354 01/15/21 2356 01/16/21 0946 01/17/21 0449 01/19/21 0512  WBC 2.7*  --  1.9* 2.5* 2.8* 2.1*  NEUTROABS 1.8  --   --   --   --   --   HGB 7.0* 7.7* 7.7* 7.6* 9.2* 9.0*  HCT 21.7* 23.3* 23.4* 23.9* 27.4* 27.7*  MCV 87.5  --  86.7 86.9 86.4 88.5  PLT 243  --  213 219 259 481   Basic Metabolic Panel: Recent Labs  Lab 01/15/21 1458 01/16/21 0946 01/17/21 0449 01/19/21 0512  NA 131* 131* 134* 131*  K 3.4* 3.9 4.2 4.0  CL 102 105 105 101  CO2 20* 21* 23 24  GLUCOSE 126* 163* 98 93  BUN 18 18 13 16   CREATININE 1.05* 1.13* 1.02* 1.29*  CALCIUM 7.3* 7.4* 7.6* 7.9*  MG 1.6*  --  2.4  --   PHOS  --  2.0*  --   --    GFR: Estimated Creatinine Clearance: 46.1 mL/min (A) (by C-G formula based on  SCr of 1.29 mg/dL (H)). Liver Function Tests: Recent Labs  Lab 01/15/21 1458 01/16/21 0946  AST 17 17  ALT 10 10  ALKPHOS 37* 36*  BILITOT 0.9 0.7  PROT 6.3* 5.9*  ALBUMIN 2.4* 2.2*   No results for input(s): LIPASE, AMYLASE in the last 168 hours. No results for input(s): AMMONIA in the last 168 hours. Coagulation Profile: No results for input(s): INR, PROTIME in the last 168 hours. Cardiac Enzymes: No results for input(s): CKTOTAL, CKMB, CKMBINDEX, TROPONINI in the last 168 hours. BNP (last 3 results) No results for input(s): PROBNP in the last 8760 hours. HbA1C: No results for input(s): HGBA1C in the last 72 hours. CBG: Recent Labs  Lab 01/18/21 1607 01/18/21 2053 01/19/21 0800 01/19/21 1135 01/19/21 1255  GLUCAP 97 133* 94 83 87   Lipid Profile: No results for input(s): CHOL, HDL, LDLCALC, TRIG, CHOLHDL, LDLDIRECT in the last 72 hours. Thyroid Function Tests: No results for input(s): TSH, T4TOTAL, FREET4, T3FREE, THYROIDAB in the last 72 hours. Anemia Panel: No results for input(s): VITAMINB12, FOLATE, FERRITIN, TIBC, IRON, RETICCTPCT in the last 72 hours. Sepsis Labs:  Recent Labs  Lab 01/15/21 1458  PROCALCITON <0.10    Recent Results (from the past 240 hour(s))  Resp Panel by RT-PCR (Flu A&B, Covid) Nasopharyngeal Swab     Status: Abnormal   Collection Time: 01/15/21  3:26 PM   Specimen: Nasopharyngeal Swab; Nasopharyngeal(NP) swabs in vial transport medium  Result Value Ref Range Status   SARS Coronavirus 2 by RT PCR NEGATIVE NEGATIVE Final    Comment: (NOTE) SARS-CoV-2 target nucleic acids are NOT DETECTED.  The SARS-CoV-2 RNA is generally detectable in upper respiratory specimens during the acute phase of infection. The lowest concentration of SARS-CoV-2 viral copies this assay can detect is 138 copies/mL. A negative result does not preclude SARS-Cov-2 infection and should not be used as the sole basis for treatment or other patient management  decisions. A negative result may occur with  improper specimen collection/handling, submission of specimen other than nasopharyngeal swab, presence of viral mutation(s) within the areas targeted by this assay, and inadequate number of viral copies(<138 copies/mL). A negative result must be combined with clinical observations, patient history, and epidemiological information. The expected result is Negative.  Fact Sheet for Patients:  EntrepreneurPulse.com.au  Fact Sheet for Healthcare Providers:  IncredibleEmployment.be  This test is no t yet approved or cleared by the [place] FDA and  has been authorized for detection and/or diagnosis of SARS-CoV-2 by FDA under an Emergency Use Authorization (EUA). This EUA will remain  in effect (meaning this test can be used) for the duration of the COVID-19 declaration under Section 564(b)(1) of the Act, 21 U.S.C.section 360bbb-3(b)(1), unless the authorization is terminated  or revoked sooner.       Influenza A by PCR POSITIVE (A) NEGATIVE Final   Influenza B by PCR NEGATIVE NEGATIVE Final    Comment: (NOTE) The Xpert Xpress SARS-CoV-2/FLU/RSV plus assay is intended as an aid in the diagnosis of influenza from Nasopharyngeal swab specimens and should not be used as a sole basis for treatment. Nasal washings and aspirates are unacceptable for Xpert Xpress SARS-CoV-2/FLU/RSV testing.  Fact Sheet for Patients: EntrepreneurPulse.com.au  Fact Sheet for Healthcare Providers: IncredibleEmployment.be  This test is not yet approved or cleared by the [place] FDA and has been authorized for detection and/or diagnosis of SARS-CoV-2 by FDA under an Emergency Use Authorization (EUA). This EUA will remain in effect (meaning this test can be used) for the duration of the COVID-19 declaration under Section 564(b)(1) of the Act, 21 U.S.C. section 360bbb-3(b)(1), unless  the authorization is terminated or revoked.  Performed at [facility], [address] [place], [place] [address]          Radiology Studies: No results found.      Scheduled Meds:  amLODipine  5 mg Oral Daily   apixaban  5 mg Oral BID   atorvastatin  40 mg Oral q1800   carvedilol  12.5 mg Oral BID WC   cyanocobalamin  1,000 mcg Intramuscular Daily   dextrose       furosemide  20 mg Oral Daily   hydrALAZINE  75 mg Oral TID   insulin aspart  0-9 Units Subcutaneous TID WC   oseltamivir  30 mg Oral BID   pantoprazole (PROTONIX) IV  40 mg Intravenous Q12H   sacubitril-valsartan  1 tablet Oral BID   spironolactone  12.5 mg Oral Daily     LOS: 4 days    Time spent: 35 minutes    Leveta Wahab Darleen Crocker, DO [facility]  If 7PM-7AM, please contact  night-coverage [email or web address] 01/19/2021, 3:18 PM

## 2021-01-19 NOTE — Transfer of Care (Signed)
Immediate Anesthesia Transfer of Care Note  Patient: Mackenzie [name]  Procedure(s) Performed: COLONOSCOPY WITH PROPOFOL  Patient Location: PACU  Anesthesia Type:General  Level of Consciousness: awake, alert , oriented and patient cooperative  Airway & Oxygen Therapy: Patient Spontanous Breathing  Post-op Assessment: Report given to RN, Post -op Vital signs reviewed and stable and Patient moving all extremities X 4  Post vital signs: Reviewed and stable  Last Vitals:  Vitals Value Taken Time  BP    Temp    Pulse    Resp    SpO2      Last Pain:  Vitals:   01/19/21 1314  TempSrc:   PainSc: 0-No pain      Patients Stated Pain Goal: 4 (32/20/25 4270)  Complications: No notable events documented.

## 2021-01-20 LAB — BASIC METABOLIC PANEL
Anion gap: 7 (ref 5–15)
BUN: 19 mg/dL (ref 8–23)
CO2: 24 mmol/L (ref 22–32)
Calcium: 7.6 mg/dL — ABNORMAL LOW (ref 8.9–10.3)
Chloride: 101 mmol/L (ref 98–111)
Creatinine, Ser: 1.29 mg/dL — ABNORMAL HIGH (ref 0.44–1.00)
GFR, Estimated: 45 mL/min — ABNORMAL LOW (ref >60)
Glucose, Bld: 96 mg/dL (ref 70–99)
Potassium: 4.1 mmol/L (ref 3.5–5.1)
Sodium: 132 mmol/L — ABNORMAL LOW (ref 135–145)

## 2021-01-20 LAB — CBC
HCT: 26.3 % — ABNORMAL LOW (ref 36.0–46.0)
Hemoglobin: 8.7 g/dL — ABNORMAL LOW (ref 12.0–15.0)
MCH: 29.2 pg (ref 26.0–34.0)
MCHC: 33.1 g/dL (ref 30.0–36.0)
MCV: 88.3 fL (ref 80.0–100.0)
Platelets: 224 10*3/uL (ref 150–400)
RBC: 2.98 MIL/uL — ABNORMAL LOW (ref 3.87–5.11)
RDW: 14 % (ref 11.5–15.5)
WBC: 3 10*3/uL — ABNORMAL LOW (ref 4.0–10.5)
nRBC: 0 % (ref 0.0–0.2)

## 2021-01-20 LAB — GLUCOSE, CAPILLARY: Glucose-Capillary: 90 mg/dL (ref 70–99)

## 2021-01-20 LAB — MAGNESIUM: Magnesium: 1.8 mg/dL (ref 1.7–2.4)

## 2021-01-20 MED ORDER — VITAMIN B-12 1000 MCG PO TABS: 1000.0000 ug | ORAL_TABLET | Freq: Every day | ORAL | 0 refills | Status: AC

## 2021-01-20 MED ORDER — GUAIFENESIN-DM 100-10 MG/5ML PO SYRP: 5.0000 mL | ORAL_SOLUTION | ORAL | 0 refills | Status: DC | PRN

## 2021-01-20 MED ORDER — SPIRONOLACTONE 25 MG PO TABS: 25.0000 mg | ORAL_TABLET | Freq: Every day | ORAL | 5 refills | Status: DC

## 2021-01-20 MED ORDER — FERROUS SULFATE 325 (65 FE) MG PO TABS: 325.0000 mg | ORAL_TABLET | Freq: Every day | ORAL | 3 refills | Status: DC

## 2021-01-20 MED ORDER — PANTOPRAZOLE SODIUM 40 MG PO TBEC: 40.0000 mg | DELAYED_RELEASE_TABLET | Freq: Every day | ORAL | 1 refills | Status: AC

## 2021-01-20 MED ORDER — BLOOD GLUCOSE MONITOR KIT: PACK | 0 refills | Status: DC

## 2021-01-20 NOTE — TOC Transition Note (Signed)
Transition of Care [facility]) - CM/SW Discharge Note   Patient Details  Name: SERYNA MAREK MRN: [id number] Date of Birth: September 30, 1950  Transition of Care [facility]) CM/SW Contact:  Natasha Bence, LCSW Phone Number: 01/20/2021, 12:01 PM   Clinical Narrative:    CSW notified of patient's for discharged and DME needs. CSW contacted Caryl Pina with [employer] for [facility] referral. Caryl Pina agreeable to provide [facility]. TOC signing off.   Final next level of care: Home/Self Care Barriers to Discharge: Continued Medical Work up   Patient Goals and CMS Choice Patient states their goals for this hospitalization and ongoing recovery are:: Return home CMS Medicare.gov Compare Post Acute Care list provided to:: Patient Choice offered to / list presented to : Patient  Discharge Placement                    Patient and family notified of of transfer: 01/20/21  Discharge Plan and Services                DME Arranged: Bedside commode DME Agency: [employer] Date DME Agency Contacted: 01/20/21 Time DME Agency Contacted: 1201 Representative spoke with at DME Agency: [place] Interventions     Readmission Risk Interventions No flowsheet data found.

## 2021-01-20 NOTE — Progress Notes (Signed)
Mackenzie [name], M.D. Gastroenterology & Hepatology   Interval History:  Patient denies having any complaints and states feeling fine.  Has been able to tolerate diet without any nausea, vomiting, abdominal pain, little distention, diarrhea.  No melena or hematocheiza. Mackenzie [name] restarted yesterday night. Notably, her Hb was stable 8.7.  Inpatient Medications:  Current Facility-Administered Medications:    acetaminophen (TYLENOL) tablet 650 mg, 650 mg, Oral, Q6H PRN, 650 mg at 01/18/21 0442 **OR** acetaminophen (TYLENOL) suppository 650 mg, 650 mg, Rectal, Q6H PRN, Mackenzie [name], Mackenzie E, MD   amLODipine (NORVASC) tablet 5 mg, 5 mg, Oral, Daily, Barton Dubois, MD, 5 mg at 01/18/21 0844   apixaban (Mackenzie [name]) tablet 5 mg, 5 mg, Oral, BID, Manuella Ghazi, Pratik D, DO, 5 mg at 01/19/21 2129   atorvastatin (LIPITOR) tablet 40 mg, 40 mg, Oral, q1800, Mackenzie [name], Mackenzie E, MD, 40 mg at 01/19/21 1641   carvedilol (COREG) tablet 12.5 mg, 12.5 mg, Oral, BID WC, Mackenzie [name], Mackenzie E, MD, 12.5 mg at 01/19/21 1641   cyanocobalamin ((VITAMIN B-12)) injection 1,000 mcg, 1,000 mcg, Intramuscular, Daily, Barton Dubois, MD, 1,000 mcg at 01/18/21 0857   furosemide (LASIX) tablet 20 mg, 20 mg, Oral, Daily, Mackenzie [name], Mackenzie E, MD, 20 mg at 01/18/21 0844   guaiFENesin-dextromethorphan (ROBITUSSIN DM) 100-10 MG/5ML syrup 5 mL, 5 mL, Oral, Q4H PRN, Mackenzie [name], Mackenzie E, MD, 5 mL at 01/19/21 2129   insulin aspart (novoLOG) injection 0-5 Units, 0-5 Units, Subcutaneous, QHS, Adefeso, Oladapo, DO   insulin aspart (novoLOG) injection 0-9 Units, 0-9 Units, Subcutaneous, TID WC, Mackenzie Dike, MD, 1 Units at 01/16/21 1802   ondansetron (ZOFRAN) tablet 4 mg, 4 mg, Oral, Q6H PRN **OR** ondansetron (ZOFRAN) injection 4 mg, 4 mg, Intravenous, Q6H PRN, Mackenzie [name], Mackenzie E, MD   oseltamivir (TAMIFLU) capsule 30 mg, 30 mg, Oral, BID, Mackenzie [name], Mackenzie E, MD, 30 mg at 01/19/21 2128   pantoprazole (PROTONIX) injection 40 mg, 40 mg,  Intravenous, Q12H, Harper, Mackenzie S, PA-C, 40 mg at 01/19/21 2129   polyethylene glycol (MIRALAX / GLYCOLAX) packet 17 g, 17 g, Oral, Daily PRN, Mackenzie [name], Mackenzie E, MD   sacubitril-valsartan (ENTRESTO) 24-26 mg per tablet, 1 tablet, Oral, BID, Mackenzie [name], Mackenzie E, MD, 1 tablet at 01/18/21 0843   spironolactone (ALDACTONE) tablet 12.5 mg, 12.5 mg, Oral, Daily, Barton Dubois, MD, 12.5 mg at 01/18/21 0844   I/O    Intake/Output Summary (Last 24 hours) at 01/20/2021 0914 Last data filed at 01/19/2021 2300 Gross per 24 hour  Intake 460 ml  Output 0 ml  Net 460 ml     Physical Exam: Temp:  [98.4 F (36.9 C)-99.2 F (37.3 C)] 98.7 F (37.1 C) (12/03 0514) Pulse Rate:  [72-84] 80 (12/03 0514) Resp:  [12-29] 16 (12/03 0514) BP: (70-137)/(46-65) 105/51 (12/03 0514) SpO2:  [95 %-100 %] 95 % (12/03 0514)  Temp (24hrs), Avg:98.8 F (37.1 C), Min:98.4 F (36.9 C), Max:99.2 F (37.3 C) GENERAL: The patient is AO x3, in no acute distress. HEENT: Head is normocephalic and atraumatic. EOMI are intact. Mouth is well hydrated and without lesions. NECK: Supple. No masses LUNGS: Clear to auscultation. No presence of rhonchi/wheezing/rales. Adequate chest expansion HEART: RRR, normal s1 and s2. ABDOMEN: Soft, nontender, no guarding, no peritoneal signs, and nondistended. BS +. No masses. EXTREMITIES: Without any cyanosis, clubbing, rash, lesions or edema. NEUROLOGIC: AOx3, no focal motor deficit. SKIN: no jaundice, no rashes  Laboratory Data: CBC:     Component Value Date/Time   WBC 3.0 (L) 01/20/2021 0359   RBC 2.98 (  L) 01/20/2021 0359   HGB 8.7 (L) 01/20/2021 0359   HCT 26.3 (L) 01/20/2021 0359   PLT 224 01/20/2021 0359   MCV 88.3 01/20/2021 0359   MCH 29.2 01/20/2021 0359   MCHC 33.1 01/20/2021 0359   RDW 14.0 01/20/2021 0359   LYMPHSABS 0.7 01/15/2021 1458   MONOABS 0.1 01/15/2021 1458   EOSABS 0.0 01/15/2021 1458   BASOSABS 0.0 01/15/2021 1458   COAG:  Lab Results   Component Value Date   INR 1.22 01/10/2018   INR 1.07 05/06/2016   INR 1.20 05/02/2016    BMP:  BMP Latest Ref Rng & Units 01/20/2021 01/19/2021 01/17/2021  Glucose 70 - 99 mg/dL 96 93 98  BUN 8 - 23 mg/dL _0 Creatinine 0.44 - 1.00 mg/dL 1.29(H) 1.29(H) 1.02(H)  Sodium 135 - 145 mmol/L 132(L) 131(L) 134(L)  Potassium 3.5 - 5.1 mmol/L 4.1 4.0 4.2  Chloride 98 - 111 mmol/L 101 101 105  CO2 22 - 32 mmol/L _1 Calcium 8.9 - 10.3 mg/dL 7.6(L) 7.9(L) 7.6(L)    HEPATIC:  Hepatic Function Latest Ref Rng & Units 01/16/2021 01/15/2021 01/02/2021  Total Protein 6.5 - 8.1 g/dL 5.9(L) 6.3(L) 7.3  Albumin 3.5 - 5.0 g/dL 2.2(L) 2.4(L) 3.2(L)  AST 15 - 41 U/L _2 ALT 0 - 44 U/L _3 Alk Phosphatase 38 - 126 U/L 36(L) 37(L) 60  Total Bilirubin 0.3 - 1.2 mg/dL 0.7 0.9 0.6    CARDIAC:  Lab Results  Component Value Date   TROPONINI 3.52 ([place]) 05/01/2016      Imaging: I personally reviewed and interpreted the available labs, imaging and endoscopic files.   Assessment/Plan: 70 year old female with past medical history of hypertension, hyperlipidemia, heart failure, atrial fibrillation on Mackenzie [name], who was brought to the ER after being found to have a low hemoglobin and iron stores.Has not presented any overt signs of gastrointestinal bleeding, but required transfusion. Fortunately, has responded to transfusion and hemoglobin has remained stable. Underwent both EGD and colonoscopy without alterations that would explain her anemia. As she has not presented any further anemia, would recommend outpatient capsule endoscopy in the next few weeks pending insurance approval. She will also benefit from oral iron supplementation twice a day.  - Schedule outpatient colonoscopy - Ferrous sulfate 325 mg twice a day - Follow up in GI clinic in 1-2 weeks. - GI service will sign-off, please call us back if you have any more questions.  Mackenzie Peppers, MD Gastroenterology and  Hepatology [facility]

## 2021-01-20 NOTE — Discharge Summary (Signed)
Physician Discharge Summary  Mackenzie [name] XTG:[id number] DOB: 04/11/1950 DOA: 01/15/2021  PCP: [name]  Admit date: 01/15/2021  Discharge date: 01/20/2021  Admitted From:Home  Disposition:  Home  Recommendations for Outpatient Follow-up:  Follow up with PCP in 1-2 weeks, repeat CBC in 1 week Follow-up with GI will be scheduled in approximately 1-2 weeks for outpatient capsule endoscopy per Dr.[name] Continue on iron supplements and PPI as prescribed Discontinued amlodipine and hydralazine due to lower blood pressure readings while hospitalized.  Continue spironolactone and Coreg for now as well as Entresto and consider decreasing dosages in the near future if hypotension persists  Home Health: None  Equipment/Devices: Bedside commode  Discharge Condition:Stable  CODE STATUS: Full  Diet recommendation: Heart Healthy  Brief/Interim Summary:  70 year old female with a history of atrial fibrillation on anticoagulation, systolic CHF, diabetes, presented to the emergency room after having a syncopal episodes.  She reports having dark tarry stools recently.  FOBT was found to be positive.  CBC showed hemoglobin of 7.0, down from over 9 approximately 2 weeks ago.  She was admitted for further evaluation including PRBC transfusion and GI consultation.  She has undergone EGD as well as colonoscopy with no bleeding source identified.  She was resumed on Eliquis 12/2 with no further overt bleeding on the day of discharge and stable hemoglobin levels noted.  She was also noted to have influenza a and has completed a 5-day course of Tamiflu.  She is noted to have softer blood pressure readings for which her home amlodipine and hydralazine have been discontinued and will need close follow-up.  2D echocardiogram was performed as noted below due to her syncopal episode with no acute findings noted.  GI recommends repeat CBC in 1 week and iron supplementation as  noted above with plans for capsule endoscopy in the next 1 week for further evaluation.  No other acute events noted during this inpatient stay and she is stable for discharge.  Discharge Diagnoses:  Principal Problem:   Symptomatic anemia Active Problems:   Chronic systolic heart failure (HCC)   HTN (hypertension)   Syncope   Influenza A   Atrial fibrillation (HCC)   Pneumonia due to influenza   Dark stools   Melena  Principal discharge diagnosis: Syncope and hypotension secondary to acute blood loss anemia and influenza A pneumonia.  Discharge Instructions  Discharge Instructions     DME Bedside commode   Complete by: As directed    Patient needs a bedside commode to treat with the following condition: Weakness   Diet - low sodium heart healthy   Complete by: As directed    Increase activity slowly   Complete by: As directed       Allergies as of 01/20/2021   No Known Allergies      Medication List     STOP taking these medications    amLODipine 10 MG tablet Commonly known as: NORVASC   furosemide 20 MG tablet Commonly known as: LASIX   gabapentin 100 MG capsule Commonly known as: Neurontin   hydrALAZINE 100 MG tablet Commonly known as: APRESOLINE   metoprolol tartrate 25 MG tablet Commonly known as: LOPRESSOR       TAKE these medications    albuterol 108 (90 Base) MCG/ACT inhaler Commonly known as: VENTOLIN HFA Inhale 2 puffs into the lungs daily as needed for wheezing or shortness of breath.   apixaban 5 MG Tabs tablet Commonly known as: ELIQUIS Take 1 tablet (5  mg total) by mouth 2 (two) times daily.   aspirin EC 81 MG tablet Take 81 mg by mouth daily.   atorvastatin 40 MG tablet Commonly known as: LIPITOR Take 1 tablet (40 mg total) by mouth daily at 6 PM. What changed: Another medication with the same name was removed. Continue taking this medication, and follow the directions you see here.   blood glucose meter kit and supplies  Kit Dispense based on patient and insurance preference. Use up to four times daily as directed.   carvedilol 12.5 MG tablet Commonly known as: COREG Take 1 tablet (12.5 mg total) by mouth 2 (two) times daily with a meal.   diclofenac sodium 1 % Gel Commonly known as: VOLTAREN Apply 2 g topically 3 (three) times daily as needed (pain).   ferrous sulfate 325 (65 FE) MG tablet Take 1 tablet (325 mg total) by mouth daily.   guaiFENesin-dextromethorphan 100-10 MG/5ML syrup Commonly known as: ROBITUSSIN DM Take 5 mLs by mouth every 4 (four) hours as needed for cough.   metFORMIN 500 MG tablet Commonly known as: GLUCOPHAGE Take by mouth.   pantoprazole 40 MG tablet Commonly known as: Protonix Take 1 tablet (40 mg total) by mouth daily.   sacubitril-valsartan 24-26 MG Commonly known as: ENTRESTO Take 1 tablet by mouth 2 (two) times daily.   spironolactone 25 MG tablet Commonly known as: ALDACTONE Take 1 tablet (25 mg total) by mouth daily.   vitamin B-12 1000 MCG tablet Commonly known as: CYANOCOBALAMIN Take 1 tablet (1,000 mcg total) by mouth daily.               Durable Medical Equipment  (From admission, onward)           Start     Ordered   01/20/21 0000  DME Bedside commode       Question:  Patient needs a bedside commode to treat with the following condition  Answer:  Weakness   01/20/21 [facility], [facility]. Schedule an appointment as soon as possible for a visit in 1 week(s).   Contact information: [place] [address] [phone]         [facility]. Go in 1 week(s).   Contact information: [address] [place] [phone]               No Known Allergies  Consultations: GI   Procedures/Studies: DG Chest Port 1 View  Result Date: 01/15/2021 CLINICAL DATA:  Near syncopal episode  in flu like symptoms. Hemoglobin of 6.9. History of CHF with cough for 1 week. EXAM: PORTABLE CHEST 1 VIEW COMPARISON:  March of 2018. FINDINGS: EKG leads project over the chest. Cardiac enlargement and fullness of the RIGHT and LEFT hilum is a stable finding based on comparison with previous imaging. There is no lobar consolidative process. No sign of pleural effusion on frontal radiograph or pneumothorax. Subtle opacities scattered throughout the upper and mid chest bilaterally suggested on the current study. On limited assessment there is no acute skeletal process. IMPRESSION: Cardiomegaly with central pulmonary vascular engorgement. Subtle opacities scattered throughout the upper and mid chest bilaterally, findings could represent asymmetric edema versus is atypical infection, consider follow-up PA and lateral chest radiograph when the patient is able to ensure resolution. Electronically Signed   By: Zetta Bills M.D.   On: 01/15/2021 15:35  ECHOCARDIOGRAM COMPLETE  Result Date: 01/16/2021    ECHOCARDIOGRAM REPORT   Patient Name:   Mackenzie [name] Date of Exam: 01/16/2021 Medical Rec #:  [id number]      Height:       66.0 in Accession #:    [id number]     Weight:       200.0 lb Date of Birth:  25-Mar-1950       BSA:          2.000 m Patient Age:    70 years       BP:           119/61 mmHg Patient Gender: F              HR:           74 bpm. Exam Location:  [facility] Procedure: 2D Echo, Cardiac Doppler and Color Doppler Indications:    Syncope  History:        Patient has prior history of Echocardiogram examinations, most                 recent 05/01/2016. CHF, Arrythmias:Atrial Fibrillation,                 Signs/Symptoms:Syncope; Risk Factors:Hypertension.  Sonographer:    Wenda Low Referring Phys: [place]  1. Left ventricular ejection fraction, by estimation, is 60 to 65%. The left ventricle has normal function. The left ventricle has no regional wall motion  abnormalities. There is moderate asymmetric left ventricular hypertrophy of the basal-septal segment. Left ventricular diastolic parameters are consistent with Grade II diastolic dysfunction (pseudonormalization).  2. Right ventricular systolic function is normal. The right ventricular size is normal. There is mildly elevated pulmonary artery systolic pressure. The estimated right ventricular systolic pressure is 79.8 mmHg.  3. Left atrial size was moderately dilated.  4. The mitral valve is abnormal. Trivial mitral valve regurgitation.  5. The aortic valve is tricuspid. Aortic valve regurgitation is mild. Aortic valve sclerosis/calcification is present, without any evidence of aortic stenosis. There is mild calcification of the aortic valve leaflets most notably on the [place].  6. The inferior vena cava is normal in size with greater than 50% respiratory variability, suggesting right atrial pressure of 3 mmHg. Comparison(s): No prior Echocardiogram. FINDINGS  Left Ventricle: Left ventricular ejection fraction, by estimation, is 60 to 65%. The left ventricle has normal function. The left ventricle has no regional wall motion abnormalities. The left ventricular internal cavity size was normal in size. There is  moderate asymmetric left ventricular hypertrophy of the basal-septal segment. Left ventricular diastolic parameters are consistent with Grade II diastolic dysfunction (pseudonormalization). Right Ventricle: The right ventricular size is normal. No increase in right ventricular wall thickness. Right ventricular systolic function is normal. There is mildly elevated pulmonary artery systolic pressure. The tricuspid regurgitant velocity is 3.09  m/s, and with an assumed right atrial pressure of 3 mmHg, the estimated right ventricular systolic pressure is 92.1 mmHg. Left Atrium: Left atrial size was moderately dilated. Right Atrium: Right atrial size was normal in size. Pericardium: There is no evidence of pericardial  effusion. Mitral Valve: The mitral valve is abnormal. There is mild thickening of the mitral valve leaflet(s). There is mild calcification of the mitral valve leaflet(s). Mild to moderate mitral annular calcification. Trivial mitral valve regurgitation. MV peak gradient, 4.0 mmHg. The mean mitral valve gradient is 2.0 mmHg. Tricuspid Valve: The tricuspid valve is normal in structure. Tricuspid valve regurgitation is  mild. Aortic Valve: There is calcifcation present most notably on the [place]. The aortic valve is tricuspid. Aortic valve regurgitation is mild. Aortic valve sclerosis/calcification is present, without any evidence of aortic stenosis. Aortic valve mean gradient measures 7.0 mmHg. Aortic valve peak gradient measures 12.8 mmHg. Aortic valve area, by VTI measures 1.57 cm. Pulmonic Valve: The pulmonic valve was normal in structure. Pulmonic valve regurgitation is mild. Aorta: The aortic root is normal in size and structure. Venous: The inferior vena cava is normal in size with greater than 50% respiratory variability, suggesting right atrial pressure of 3 mmHg. IAS/Shunts: No atrial level shunt detected by color flow Doppler.  LEFT VENTRICLE PLAX 2D LVIDd:         5.40 cm     Diastology LVIDs:         3.20 cm     LV e' medial:    5.29 cm/s LV PW:         0.90 cm     LV E/e' medial:  19.8 LV IVS:        1.20 cm     LV e' lateral:   6.73 cm/s LVOT diam:     1.90 cm     LV E/e' lateral: 15.6 LV SV:         64 LV SV Index:   32 LVOT Area:     2.84 cm  LV Volumes (MOD) LV vol d, MOD A2C: 88.3 ml LV vol d, MOD A4C: 60.5 ml LV vol s, MOD A2C: 25.1 ml LV vol s, MOD A4C: 27.3 ml LV SV MOD A2C:     63.2 ml LV SV MOD A4C:     60.5 ml LV SV MOD BP:      47.8 ml RIGHT VENTRICLE RV Basal diam:  3.75 cm RV Mid diam:    3.80 cm RV S prime:     15.20 cm/s TAPSE (M-mode): 2.6 cm LEFT ATRIUM             Index        RIGHT ATRIUM           Index LA diam:        4.40 cm 2.20 cm/m   RA Area:     16.80 cm LA Vol (A2C):   89.5 ml  44.75 ml/m  RA Volume:   44.80 ml  22.40 ml/m LA Vol (A4C):   76.1 ml 38.05 ml/m LA Biplane Vol: 81.1 ml 40.55 ml/m  AORTIC VALVE                     PULMONIC VALVE AV Area (Vmax):    1.58 cm      PV Vmax:       1.20 m/s AV Area (Vmean):   1.59 cm      PV Peak grad:  5.8 mmHg AV Area (VTI):     1.57 cm AV Vmax:           179.00 cm/s AV Vmean:          120.000 cm/s AV VTI:            0.409 m AV Peak Grad:      12.8 mmHg AV Mean Grad:      7.0 mmHg LVOT Vmax:         100.00 cm/s LVOT Vmean:        67.200 cm/s LVOT VTI:          0.227 m LVOT/AV VTI  ratio: 0.56  AORTA Ao Root diam: 3.40 cm MITRAL VALVE                TRICUSPID VALVE MV Area (PHT): 2.81 cm     TR Peak grad:   38.2 mmHg MV Area VTI:   1.97 cm     TR Vmax:        309.00 cm/s MV Peak grad:  4.0 mmHg MV Mean grad:  2.0 mmHg     SHUNTS MV Vmax:       1.00 m/s     Systemic VTI:  0.23 m MV Vmean:      60.2 cm/s    Systemic Diam: 1.90 cm MV Decel Time: 270 msec MV E velocity: 105.00 cm/s MV A velocity: 66.70 cm/s MV E/A ratio:  1.57 Gwyndolyn Kaufman MD Electronically signed by Gwyndolyn Kaufman MD Signature Date/Time: 01/16/2021/6:13:09 PM    Final      Discharge Exam: Vitals:   01/20/21 0514 01/20/21 0933  BP: (!) 105/51 110/60  Pulse: 80 81  Resp: 16   Temp: 98.7 F (37.1 C)   SpO2: 95%    Vitals:   01/19/21 1628 01/19/21 2055 01/20/21 0514 01/20/21 0933  BP: (!) 130/54 (!) 105/47 (!) 105/51 110/60  Pulse: 79 84 80 81  Resp:  19 16   Temp: 99.1 F (37.3 C) 99 F (37.2 C) 98.7 F (37.1 C)   TempSrc: Oral Oral    SpO2:  95% 95%   Weight:      Height:        General: Pt is alert, awake, not in acute distress Cardiovascular: RRR, S1/S2 +, no rubs, no gallops Respiratory: CTA bilaterally, no wheezing, no rhonchi Abdominal: Soft, NT, ND, bowel sounds + Extremities: no edema, no cyanosis    The results of significant diagnostics from this hospitalization (including imaging, microbiology, ancillary and laboratory) are  listed below for reference.     Microbiology: Recent Results (from the past 240 hour(s))  Resp Panel by RT-PCR (Flu A&B, Covid) Nasopharyngeal Swab     Status: Abnormal   Collection Time: 01/15/21  3:26 PM   Specimen: Nasopharyngeal Swab; Nasopharyngeal(NP) swabs in vial transport medium  Result Value Ref Range Status   SARS Coronavirus 2 by RT PCR NEGATIVE NEGATIVE Final    Comment: (NOTE) SARS-CoV-2 target nucleic acids are NOT DETECTED.  The SARS-CoV-2 RNA is generally detectable in upper respiratory specimens during the acute phase of infection. The lowest concentration of SARS-CoV-2 viral copies this assay can detect is 138 copies/mL. A negative result does not preclude SARS-Cov-2 infection and should not be used as the sole basis for treatment or other patient management decisions. A negative result may occur with  improper specimen collection/handling, submission of specimen other than nasopharyngeal swab, presence of viral mutation(s) within the areas targeted by this assay, and inadequate number of viral copies(<138 copies/mL). A negative result must be combined with clinical observations, patient history, and epidemiological information. The expected result is Negative.  Fact Sheet for Patients:  EntrepreneurPulse.com.au  Fact Sheet for Healthcare Providers:  IncredibleEmployment.be  This test is no t yet approved or cleared by the [place] FDA and  has been authorized for detection and/or diagnosis of SARS-CoV-2 by FDA under an Emergency Use Authorization (EUA). This EUA will remain  in effect (meaning this test can be used) for the duration of the COVID-19 declaration under Section 564(b)(1) of the Act, 21 U.S.C.section 360bbb-3(b)(1), unless the authorization is terminated  or revoked sooner.  Influenza A by PCR POSITIVE (A) NEGATIVE Final   Influenza B by PCR NEGATIVE NEGATIVE Final    Comment: (NOTE) The Xpert  Xpress SARS-CoV-2/FLU/RSV plus assay is intended as an aid in the diagnosis of influenza from Nasopharyngeal swab specimens and should not be used as a sole basis for treatment. Nasal washings and aspirates are unacceptable for Xpert Xpress SARS-CoV-2/FLU/RSV testing.  Fact Sheet for Patients: EntrepreneurPulse.com.au  Fact Sheet for Healthcare Providers: IncredibleEmployment.be  This test is not yet approved or cleared by the [place] FDA and has been authorized for detection and/or diagnosis of SARS-CoV-2 by FDA under an Emergency Use Authorization (EUA). This EUA will remain in effect (meaning this test can be used) for the duration of the COVID-19 declaration under Section 564(b)(1) of the Act, 21 U.S.C. section 360bbb-3(b)(1), unless the authorization is terminated or revoked.  Performed at [facility], [address] [place], [place] [address]      Labs: BNP (last 3 results) Recent Labs    01/15/21 1639  BNP 488.8*   Basic Metabolic Panel: Recent Labs  Lab 01/15/21 1458 01/16/21 0946 01/17/21 0449 01/19/21 0512 01/20/21 0359  NA 131* 131* 134* 131* 132*  K 3.4* 3.9 4.2 4.0 4.1  CL 102 105 105 101 101  CO2 20* 21* _0 GLUCOSE 126* 163* 98 93 96  BUN _1 CREATININE 1.05* 1.13* 1.02* 1.29* 1.29*  CALCIUM 7.3* 7.4* 7.6* 7.9* 7.6*  MG 1.6*  --  2.4  --  1.8  PHOS  --  2.0*  --   --   --    Liver Function Tests: Recent Labs  Lab 01/15/21 1458 01/16/21 0946  AST 17 17  ALT 10 10  ALKPHOS 37* 36*  BILITOT 0.9 0.7  PROT 6.3* 5.9*  ALBUMIN 2.4* 2.2*   No results for input(s): LIPASE, AMYLASE in the last 168 hours. No results for input(s): AMMONIA in the last 168 hours. CBC: Recent Labs  Lab 01/15/21 1458 01/15/21 2354 01/15/21 2356 01/16/21 0946 01/17/21 0449 01/19/21 0512 01/20/21 0359  WBC 2.7*  --  1.9* 2.5* 2.8* 2.1* 3.0*  NEUTROABS 1.8  --   --   --   --   --   --   HGB 7.0*   < >  7.7* 7.6* 9.2* 9.0* 8.7*  HCT 21.7*   < > 23.4* 23.9* 27.4* 27.7* 26.3*  MCV 87.5  --  86.7 86.9 86.4 88.5 88.3  PLT 243  --  213 219 259 256 224   < > = values in this interval not displayed.   Cardiac Enzymes: No results for input(s): CKTOTAL, CKMB, CKMBINDEX, TROPONINI in the last 168 hours. BNP: Invalid input(s): POCBNP CBG: Recent Labs  Lab 01/19/21 1135 01/19/21 1255 01/19/21 1619 01/19/21 2054 01/20/21 0734  GLUCAP 83 87 101* 106* 90   D-Dimer No results for input(s): DDIMER in the last 72 hours. Hgb A1c No results for input(s): HGBA1C in the last 72 hours. Lipid Profile No results for input(s): CHOL, HDL, LDLCALC, TRIG, CHOLHDL, LDLDIRECT in the last 72 hours. Thyroid function studies No results for input(s): TSH, T4TOTAL, T3FREE, THYROIDAB in the last 72 hours.  Invalid input(s): FREET3 Anemia work up No results for input(s): VITAMINB12, FOLATE, FERRITIN, TIBC, IRON, RETICCTPCT in the last 72 hours. Urinalysis    Component Value Date/Time   COLORURINE YELLOW (A) 01/02/2021 2219   APPEARANCEUR HAZY (A) 01/02/2021 2219   LABSPEC 1.019 01/02/2021 2219  PHURINE 5.0 01/02/2021 2219   [place] 01/02/2021 2219   [place] NEGATIVE 01/02/2021 2219   BILIRUBINUR NEGATIVE 01/02/2021 2219   KETONESUR 5 (A) 01/02/2021 2219   PROTEINUR 30 (A) 01/02/2021 2219   NITRITE NEGATIVE 01/02/2021 2219   LEUKOCYTESUR TRACE (A) 01/02/2021 2219   Sepsis Labs Invalid input(s): PROCALCITONIN,  WBC,  LACTICIDVEN Microbiology Recent Results (from the past 240 hour(s))  Resp Panel by RT-PCR (Flu A&B, Covid) Nasopharyngeal Swab     Status: Abnormal   Collection Time: 01/15/21  3:26 PM   Specimen: Nasopharyngeal Swab; Nasopharyngeal(NP) swabs in vial transport medium  Result Value Ref Range Status   SARS Coronavirus 2 by RT PCR NEGATIVE NEGATIVE Final    Comment: (NOTE) SARS-CoV-2 target nucleic acids are NOT DETECTED.  The SARS-CoV-2 RNA is generally detectable in upper  respiratory specimens during the acute phase of infection. The lowest concentration of SARS-CoV-2 viral copies this assay can detect is 138 copies/mL. A negative result does not preclude SARS-Cov-2 infection and should not be used as the sole basis for treatment or other patient management decisions. A negative result may occur with  improper specimen collection/handling, submission of specimen other than nasopharyngeal swab, presence of viral mutation(s) within the areas targeted by this assay, and inadequate number of viral copies(<138 copies/mL). A negative result must be combined with clinical observations, patient history, and epidemiological information. The expected result is Negative.  Fact Sheet for Patients:  EntrepreneurPulse.com.au  Fact Sheet for Healthcare Providers:  IncredibleEmployment.be  This test is no t yet approved or cleared by the [place] FDA and  has been authorized for detection and/or diagnosis of SARS-CoV-2 by FDA under an Emergency Use Authorization (EUA). This EUA will remain  in effect (meaning this test can be used) for the duration of the COVID-19 declaration under Section 564(b)(1) of the Act, 21 U.S.C.section 360bbb-3(b)(1), unless the authorization is terminated  or revoked sooner.       Influenza A by PCR POSITIVE (A) NEGATIVE Final   Influenza B by PCR NEGATIVE NEGATIVE Final    Comment: (NOTE) The Xpert Xpress SARS-CoV-2/FLU/RSV plus assay is intended as an aid in the diagnosis of influenza from Nasopharyngeal swab specimens and should not be used as a sole basis for treatment. Nasal washings and aspirates are unacceptable for Xpert Xpress SARS-CoV-2/FLU/RSV testing.  Fact Sheet for Patients: EntrepreneurPulse.com.au  Fact Sheet for Healthcare Providers: IncredibleEmployment.be  This test is not yet approved or cleared by the [place] FDA and has been  authorized for detection and/or diagnosis of SARS-CoV-2 by FDA under an Emergency Use Authorization (EUA). This EUA will remain in effect (meaning this test can be used) for the duration of the COVID-19 declaration under Section 564(b)(1) of the Act, 21 U.S.C. section 360bbb-3(b)(1), unless the authorization is terminated or revoked.  Performed at [facility], [address], [place], [place] [address]      Time coordinating discharge: 35 minutes  SIGNED:   Rodena Goldmann, DO [facility] 01/20/2021, 10:01 AM  If 7PM-7AM, please contact night-coverage [email or web address]

## 2021-01-20 NOTE — Progress Notes (Signed)
Discharge instructions given patient and daughter verbalized understanding.

## 2021-01-22 ENCOUNTER — Encounter (HOSPITAL_COMMUNITY): Payer: Self-pay | Admitting: Gastroenterology

## 2021-01-23 ENCOUNTER — Telehealth: Payer: Self-pay

## 2021-01-23 ENCOUNTER — Encounter (INDEPENDENT_AMBULATORY_CARE_PROVIDER_SITE_OTHER): Payer: Self-pay

## 2021-01-23 ENCOUNTER — Encounter: Payer: Self-pay | Admitting: Internal Medicine

## 2021-01-23 ENCOUNTER — Inpatient Hospital Stay: Payer: Medicare Other

## 2021-01-23 ENCOUNTER — Inpatient Hospital Stay: Payer: Medicare Other | Attending: Internal Medicine | Admitting: Internal Medicine

## 2021-01-23 ENCOUNTER — Other Ambulatory Visit: Payer: Self-pay

## 2021-01-23 DIAGNOSIS — R0602 Shortness of breath: Secondary | ICD-10-CM | POA: Insufficient documentation

## 2021-01-23 DIAGNOSIS — I110 Hypertensive heart disease with heart failure: Secondary | ICD-10-CM | POA: Insufficient documentation

## 2021-01-23 DIAGNOSIS — D649 Anemia, unspecified: Secondary | ICD-10-CM | POA: Diagnosis present

## 2021-01-23 DIAGNOSIS — Z803 Family history of malignant neoplasm of breast: Secondary | ICD-10-CM | POA: Insufficient documentation

## 2021-01-23 DIAGNOSIS — Z7901 Long term (current) use of anticoagulants: Secondary | ICD-10-CM | POA: Diagnosis not present

## 2021-01-23 DIAGNOSIS — Z833 Family history of diabetes mellitus: Secondary | ICD-10-CM | POA: Diagnosis not present

## 2021-01-23 DIAGNOSIS — Z8249 Family history of ischemic heart disease and other diseases of the circulatory system: Secondary | ICD-10-CM | POA: Diagnosis not present

## 2021-01-23 DIAGNOSIS — M2550 Pain in unspecified joint: Secondary | ICD-10-CM | POA: Insufficient documentation

## 2021-01-23 DIAGNOSIS — Z79899 Other long term (current) drug therapy: Secondary | ICD-10-CM | POA: Diagnosis not present

## 2021-01-23 DIAGNOSIS — I5022 Chronic systolic (congestive) heart failure: Secondary | ICD-10-CM | POA: Diagnosis not present

## 2021-01-23 DIAGNOSIS — I082 Rheumatic disorders of both aortic and tricuspid valves: Secondary | ICD-10-CM | POA: Insufficient documentation

## 2021-01-23 DIAGNOSIS — I4891 Unspecified atrial fibrillation: Secondary | ICD-10-CM | POA: Diagnosis not present

## 2021-01-23 DIAGNOSIS — R5383 Other fatigue: Secondary | ICD-10-CM | POA: Insufficient documentation

## 2021-01-23 DIAGNOSIS — R55 Syncope and collapse: Secondary | ICD-10-CM | POA: Diagnosis not present

## 2021-01-23 DIAGNOSIS — M549 Dorsalgia, unspecified: Secondary | ICD-10-CM | POA: Insufficient documentation

## 2021-01-23 LAB — CBC WITH DIFFERENTIAL/PLATELET
Abs Immature Granulocytes: 0 10*3/uL (ref 0.00–0.07)
Basophils Absolute: 0 10*3/uL (ref 0.0–0.1)
Basophils Relative: 1 %
Eosinophils Absolute: 0 10*3/uL (ref 0.0–0.5)
Eosinophils Relative: 1 %
HCT: 29.2 % — ABNORMAL LOW (ref 36.0–46.0)
Hemoglobin: 9.4 g/dL — ABNORMAL LOW (ref 12.0–15.0)
Immature Granulocytes: 0 %
Lymphocytes Relative: 23 %
Lymphs Abs: 0.7 10*3/uL (ref 0.7–4.0)
MCH: 27.9 pg (ref 26.0–34.0)
MCHC: 32.2 g/dL (ref 30.0–36.0)
MCV: 86.6 fL (ref 80.0–100.0)
Monocytes Absolute: 0.2 10*3/uL (ref 0.1–1.0)
Monocytes Relative: 7 %
Neutro Abs: 2.2 10*3/uL (ref 1.7–7.7)
Neutrophils Relative %: 68 %
Platelets: 252 10*3/uL (ref 150–400)
RBC: 3.37 MIL/uL — ABNORMAL LOW (ref 3.87–5.11)
RDW: 13.6 % (ref 11.5–15.5)
WBC: 3.2 10*3/uL — ABNORMAL LOW (ref 4.0–10.5)
nRBC: 0 % (ref 0.0–0.2)

## 2021-01-23 LAB — COMPREHENSIVE METABOLIC PANEL
ALT: 10 U/L (ref 0–44)
AST: 17 U/L (ref 15–41)
Albumin: 2.8 g/dL — ABNORMAL LOW (ref 3.5–5.0)
Alkaline Phosphatase: 49 U/L (ref 38–126)
Anion gap: 12 (ref 5–15)
BUN: 22 mg/dL (ref 8–23)
CO2: 23 mmol/L (ref 22–32)
Calcium: 8.2 mg/dL — ABNORMAL LOW (ref 8.9–10.3)
Chloride: 97 mmol/L — ABNORMAL LOW (ref 98–111)
Creatinine, Ser: 1.18 mg/dL — ABNORMAL HIGH (ref 0.44–1.00)
GFR, Estimated: 50 mL/min — ABNORMAL LOW (ref >60)
Glucose, Bld: 100 mg/dL — ABNORMAL HIGH (ref 70–99)
Potassium: 4.2 mmol/L (ref 3.5–5.1)
Sodium: 132 mmol/L — ABNORMAL LOW (ref 135–145)
Total Bilirubin: 0.7 mg/dL (ref 0.3–1.2)
Total Protein: 7.8 g/dL (ref 6.5–8.1)

## 2021-01-23 LAB — IRON AND TIBC
Iron: 17 ug/dL — ABNORMAL LOW (ref 28–170)
Saturation Ratios: 12 % (ref 10.4–31.8)
TIBC: 143 ug/dL — ABNORMAL LOW (ref 250–450)
UIBC: 126 ug/dL

## 2021-01-23 LAB — FERRITIN: Ferritin: 534 ng/mL — ABNORMAL HIGH (ref 11–307)

## 2021-01-23 LAB — LACTATE DEHYDROGENASE: LDH: 217 U/L — ABNORMAL HIGH (ref 98–192)

## 2021-01-23 NOTE — Assessment & Plan Note (Addendum)
#   Anemia/symptomatic-question GI bleed [see below]-November 2022 nadir hemoglobin 7; s/p PRBC transfusion [in hospital].  Patient on p.o. iron-tolerating well.  Recommend continue p.o. iron.  Discussed with the patient daughter that if repeat labs show lower hemoglobin/would recommend IV iron infusion.  Discussed regarding infusion reactions with iron infusions.  #Etiology of iron deficient is unclear-however likely GI given history of tarry stools.  EGD colonoscopy negative-normal 2022.  Awaiting capsule study.   Thank you for allowing me to participate in the care of your pleasant patient. Please do not hesitate to contact me with questions or concerns in the interim.  # DISPOSITION: # labs today- cbc/cmp/ldh; iron studies/ferritin # Follow up TBD-Dr.B  Addendum: Please inform patient's daughter that her hemoglobin is improved at 9.4.  Okay to hold off any iron infusion at this time.  Recommend that she takes iron pills at least 1 a day.  Recommend follow-up in 6 weeks or so- MD; labs-cbc/bmp; possible Venofer at that time.

## 2021-01-23 NOTE — Telephone Encounter (Signed)
Message received from MD:  Please inform patient's daughter that her hemoglobin is improved at 9.4.  Okay to hold off any iron infusion at this time.  Recommend that she take iron pills at least 1 a day.                                                                                                                                 Colette, please schedule follow-up in 6 weeks or so for lab (cbc bmp)/MD/possible Venofer *new* at that time.  Patient's daughter notified and she would like a phone call with appt details.

## 2021-01-23 NOTE — Progress Notes (Signed)
[place] NOTE  Patient Care Team: [name], MD as PCP - General (Cardiology) Alisa Graff, FNP as Nurse Practitioner (Family Medicine) [name], MD as Consulting Physician (Cardiology)  CHIEF COMPLAINTS/PURPOSE OF CONSULTATION: ANEMIA   HEMATOLOGY HISTORY:  # ANEMIA [NOV 2022 ; syncope hemoglobin 7 [facility]-FOBT positive EGD/ colonoscopy-no source of bleeding noted; capsule-pending on DEC 16th s/p previous epilepsy transfusion  # atrial fibrillation on anticoagulation , systolic CHF [Dr.Khan] diabetes  HISTORY OF PRESENTING ILLNESS: Patient in wheelchair.  Accompanied by daughter. Mackenzie [name] 70 y.o.  female has been referred to [place] for further evaluation/work-up for anemia.  Patient was recently evaluated in the emergency room for syncope.  Patient's hemoglobin was found to 7 from baseline of hemoglobin 12.2 years ago.  FOBT positive.  Patient was admitted to the [facility]-had extensive work-up including EGD/colonoscopy-no source identified.  She is awaiting capsule study.  Of note patient was also found to have influenza A.  S/p Tamiflu.  Patient is currently on iron pills tolerating it very well.  Denies any constipation or diarrhea.  Energy levels improving.  Review of Systems  Constitutional:  Positive for malaise/fatigue. Negative for chills, diaphoresis, fever and weight loss.  HENT:  Negative for nosebleeds and sore throat.   Eyes:  Negative for double vision.  Respiratory:  Positive for shortness of breath. Negative for cough, hemoptysis, sputum production and wheezing.   Cardiovascular:  Negative for chest pain, palpitations, orthopnea and leg swelling.  Gastrointestinal:  Negative for abdominal pain, blood in stool, constipation, diarrhea, heartburn, melena, nausea and vomiting.  Genitourinary:  Negative for dysuria, frequency and urgency.  Musculoskeletal:  Positive for back pain and joint pain.  Skin: Negative.  Negative  for itching and rash.  Neurological:  Negative for dizziness, tingling, focal weakness, weakness and headaches.  Endo/Heme/Allergies:  Does not bruise/bleed easily.  Psychiatric/Behavioral:  Negative for depression. The patient is not nervous/anxious and does not have insomnia.    MEDICAL HISTORY:  Past Medical History:  Diagnosis Date   Arthritis    Atrial fibrillation (HCC)    CHF (congestive heart failure) ([place])    HLD (hyperlipidemia)    Hypertension     SURGICAL HISTORY: Past Surgical History:  Procedure Laterality Date   COLONOSCOPY WITH PROPOFOL N/A 01/19/2021   Procedure: COLONOSCOPY WITH PROPOFOL;  Surgeon: Harvel Quale, MD;  Location[facility];  Service: Gastroenterology;  Laterality: N/A;   ESOPHAGOGASTRODUODENOSCOPY (EGD) WITH PROPOFOL N/A 01/17/2021   Procedure: ESOPHAGOGASTRODUODENOSCOPY (EGD) WITH PROPOFOL;  Surgeon: Rogene Houston, MD;  Location: [facility];  Service: Endoscopy;  Laterality: N/A;   LEFT HEART CATH AND CORONARY ANGIOGRAPHY Right 05/06/2016   Procedure: Left Heart Cath and Coronary Angiography;  Surgeon: [name], MD;  Location: [place] CV LAB;  Service: Cardiovascular;  Laterality: Right;    SOCIAL HISTORY: Social History   Socioeconomic History   Marital status: Widowed    Spouse name: Not on file   Number of children: Not on file   Years of education: Not on file   Highest education level: Not on file  Occupational History   Not on file  Tobacco Use   Smoking status: Never   Smokeless tobacco: Never  Vaping Use   Vaping Use: Never used  Substance and Sexual Activity   Alcohol use: No   Drug use: No   Sexual activity: Not on file  Other Topics Concern   Not on file  Social  History Narrative   Not on file   Social Determinants of Health   Financial Resource Strain: Not on file  Food Insecurity: Not on file  Transportation Needs: Not on file  Physical Activity: Not on file  Stress: Not on file   Social Connections: Not on file  Intimate Partner Violence: Not on file    FAMILY HISTORY: Family History  Problem Relation Age of Onset   Diabetes Mother    Heart disease Mother    Heart attack Father    Diabetes Brother    Breast cancer Maternal Grandmother    Breast cancer Maternal Aunt    Colon cancer Neg Hx    Colon polyps Neg Hx     ALLERGIES:  has No Known Allergies.  MEDICATIONS:  Current Outpatient Medications  Medication Sig Dispense Refill   albuterol (PROVENTIL HFA;VENTOLIN HFA) 108 (90 Base) MCG/ACT inhaler Inhale 2 puffs into the lungs daily as needed for wheezing or shortness of breath. 1 Inhaler 5   apixaban (ELIQUIS) 5 MG TABS tablet Take 1 tablet (5 mg total) by mouth 2 (two) times daily. 60 tablet 5   aspirin EC 81 MG tablet Take 81 mg by mouth daily.     atorvastatin (LIPITOR) 40 MG tablet Take 1 tablet (40 mg total) by mouth daily at 6 PM. 30 tablet 5   blood glucose meter kit and supplies KIT Dispense based on patient and insurance preference. Use up to four times daily as directed. 1 each 0   carvedilol (COREG) 12.5 MG tablet Take 1 tablet (12.5 mg total) by mouth 2 (two) times daily with a meal. 60 tablet 5   guaiFENesin-dextromethorphan (ROBITUSSIN DM) 100-10 MG/5ML syrup Take 5 mLs by mouth every 4 (four) hours as needed for cough. 118 mL 0   pantoprazole (PROTONIX) 40 MG tablet Take 1 tablet (40 mg total) by mouth daily. 30 tablet 1   sacubitril-valsartan (ENTRESTO) 24-26 MG Take 1 tablet by mouth 2 (two) times daily. 60 tablet 3   spironolactone (ALDACTONE) 25 MG tablet Take 1 tablet (25 mg total) by mouth daily. 30 tablet 5   vitamin B-12 (CYANOCOBALAMIN) 1000 MCG tablet Take 1 tablet (1,000 mcg total) by mouth daily. 30 tablet 0   ferrous sulfate 325 (65 FE) MG tablet Take 1 tablet (325 mg total) by mouth daily. (Patient not taking: Reported on 01/23/2021) 30 tablet 3   metFORMIN (GLUCOPHAGE) 500 MG tablet Take by mouth. (Patient not taking: Reported  on 01/15/2021)     No current facility-administered medications for this visit.      PHYSICAL EXAMINATION:   Vitals:   01/23/21 1125  BP: 127/70  Pulse: 79  Resp: 16  Temp: 99.5 F (37.5 C)  SpO2: 100%   Filed Weights   01/23/21 1125  Weight: 168 lb (76.2 kg)    Physical Exam Vitals and nursing note reviewed.  HENT:     Head: Normocephalic and atraumatic.     Mouth/Throat:     Pharynx: Oropharynx is clear.  Eyes:     Extraocular Movements: Extraocular movements intact.     Pupils: Pupils are equal, round, and reactive to light.  Cardiovascular:     Rate and Rhythm: Normal rate and regular rhythm.  Pulmonary:     Comments: Decreased breath sounds bilaterally.  Abdominal:     Palpations: Abdomen is soft.  Musculoskeletal:        General: Normal range of motion.     Cervical back: Normal range of motion.  Skin:    General: Skin is warm.  Neurological:     General: No focal deficit present.     Mental Status: She is alert and oriented to person, place, and time.  Psychiatric:        Behavior: Behavior normal.        Judgment: Judgment normal.    LABORATORY DATA:  I have reviewed the data as listed Lab Results  Component Value Date   WBC 3.2 (L) 01/23/2021   HGB 9.4 (L) 01/23/2021   HCT 29.2 (L) 01/23/2021   MCV 86.6 01/23/2021   PLT 252 01/23/2021   Recent Labs    01/15/21 1458 01/16/21 0946 01/17/21 0449 01/19/21 0512 01/20/21 0359 01/23/21 1211  NA 131* 131*   < > 131* 132* 132*  K 3.4* 3.9   < > 4.0 4.1 4.2  CL 102 105   < > 101 101 97*  CO2 20* 21*   < > _0 GLUCOSE 126* 163*   < > 93 96 100*  BUN 18 18   < > _1 CREATININE 1.05* 1.13*   < > 1.29* 1.29* 1.18*  CALCIUM 7.3* 7.4*   < > 7.9* 7.6* 8.2*  GFRNONAA 57* 52*   < > 45* 45* 50*  PROT 6.3* 5.9*  --   --   --  7.8  ALBUMIN 2.4* 2.2*  --   --   --  2.8*  AST 17 17  --   --   --  17  ALT 10 10  --   --   --  10  ALKPHOS 37* 36*  --   --   --  49  BILITOT 0.9 0.7  --    --   --  0.7   < > = values in this interval not displayed.     DG Chest Port 1 View  Result Date: 01/15/2021 CLINICAL DATA:  Near syncopal episode in flu like symptoms. Hemoglobin of 6.9. History of CHF with cough for 1 week. EXAM: PORTABLE CHEST 1 VIEW COMPARISON:  March of 2018. FINDINGS: EKG leads project over the chest. Cardiac enlargement and fullness of the RIGHT and LEFT hilum is a stable finding based on comparison with previous imaging. There is no lobar consolidative process. No sign of pleural effusion on frontal radiograph or pneumothorax. Subtle opacities scattered throughout the upper and mid chest bilaterally suggested on the current study. On limited assessment there is no acute skeletal process. IMPRESSION: Cardiomegaly with central pulmonary vascular engorgement. Subtle opacities scattered throughout the upper and mid chest bilaterally, findings could represent asymmetric edema versus is atypical infection, consider follow-up PA and lateral chest radiograph when the patient is able to ensure resolution. Electronically Signed   By: Zetta Bills M.D.   On: 01/15/2021 15:35   ECHOCARDIOGRAM COMPLETE  Result Date: 01/16/2021    ECHOCARDIOGRAM REPORT   Patient Name:   Mackenzie [name] Date of Exam: 01/16/2021 Medical Rec #:  [id number]      Height:       66.0 in Accession #:    [id number]     Weight:       200.0 lb Date of Birth:  Jul 10, 1950       BSA:          2.000 m Patient Age:    16 years       BP:           119/61 mmHg Patient Gender: F  HR:           74 bpm. Exam Location:  [facility] Procedure: 2D Echo, Cardiac Doppler and Color Doppler Indications:    Syncope  History:        Patient has prior history of Echocardiogram examinations, most                 recent 05/01/2016. CHF, Arrythmias:Atrial Fibrillation,                 Signs/Symptoms:Syncope; Risk Factors:Hypertension.  Sonographer:    Wenda Low Referring Phys: [place]  1. Left  ventricular ejection fraction, by estimation, is 60 to 65%. The left ventricle has normal function. The left ventricle has no regional wall motion abnormalities. There is moderate asymmetric left ventricular hypertrophy of the basal-septal segment. Left ventricular diastolic parameters are consistent with Grade II diastolic dysfunction (pseudonormalization).  2. Right ventricular systolic function is normal. The right ventricular size is normal. There is mildly elevated pulmonary artery systolic pressure. The estimated right ventricular systolic pressure is 88.4 mmHg.  3. Left atrial size was moderately dilated.  4. The mitral valve is abnormal. Trivial mitral valve regurgitation.  5. The aortic valve is tricuspid. Aortic valve regurgitation is mild. Aortic valve sclerosis/calcification is present, without any evidence of aortic stenosis. There is mild calcification of the aortic valve leaflets most notably on the [place].  6. The inferior vena cava is normal in size with greater than 50% respiratory variability, suggesting right atrial pressure of 3 mmHg. Comparison(s): No prior Echocardiogram. FINDINGS  Left Ventricle: Left ventricular ejection fraction, by estimation, is 60 to 65%. The left ventricle has normal function. The left ventricle has no regional wall motion abnormalities. The left ventricular internal cavity size was normal in size. There is  moderate asymmetric left ventricular hypertrophy of the basal-septal segment. Left ventricular diastolic parameters are consistent with Grade II diastolic dysfunction (pseudonormalization). Right Ventricle: The right ventricular size is normal. No increase in right ventricular wall thickness. Right ventricular systolic function is normal. There is mildly elevated pulmonary artery systolic pressure. The tricuspid regurgitant velocity is 3.09  m/s, and with an assumed right atrial pressure of 3 mmHg, the estimated right ventricular systolic pressure is 16.6 mmHg. Left  Atrium: Left atrial size was moderately dilated. Right Atrium: Right atrial size was normal in size. Pericardium: There is no evidence of pericardial effusion. Mitral Valve: The mitral valve is abnormal. There is mild thickening of the mitral valve leaflet(s). There is mild calcification of the mitral valve leaflet(s). Mild to moderate mitral annular calcification. Trivial mitral valve regurgitation. MV peak gradient, 4.0 mmHg. The mean mitral valve gradient is 2.0 mmHg. Tricuspid Valve: The tricuspid valve is normal in structure. Tricuspid valve regurgitation is mild. Aortic Valve: There is calcifcation present most notably on the [place]. The aortic valve is tricuspid. Aortic valve regurgitation is mild. Aortic valve sclerosis/calcification is present, without any evidence of aortic stenosis. Aortic valve mean gradient measures 7.0 mmHg. Aortic valve peak gradient measures 12.8 mmHg. Aortic valve area, by VTI measures 1.57 cm. Pulmonic Valve: The pulmonic valve was normal in structure. Pulmonic valve regurgitation is mild. Aorta: The aortic root is normal in size and structure. Venous: The inferior vena cava is normal in size with greater than 50% respiratory variability, suggesting right atrial pressure of 3 mmHg. IAS/Shunts: No atrial level shunt detected by color flow Doppler.  LEFT VENTRICLE PLAX 2D LVIDd:         5.40 cm  Diastology LVIDs:         3.20 cm     LV e' medial:    5.29 cm/s LV PW:         0.90 cm     LV E/e' medial:  19.8 LV IVS:        1.20 cm     LV e' lateral:   6.73 cm/s LVOT diam:     1.90 cm     LV E/e' lateral: 15.6 LV SV:         64 LV SV Index:   32 LVOT Area:     2.84 cm  LV Volumes (MOD) LV vol d, MOD A2C: 88.3 ml LV vol d, MOD A4C: 60.5 ml LV vol s, MOD A2C: 25.1 ml LV vol s, MOD A4C: 27.3 ml LV SV MOD A2C:     63.2 ml LV SV MOD A4C:     60.5 ml LV SV MOD BP:      47.8 ml RIGHT VENTRICLE RV Basal diam:  3.75 cm RV Mid diam:    3.80 cm RV S prime:     15.20 cm/s TAPSE (M-mode): 2.6 cm  LEFT ATRIUM             Index        RIGHT ATRIUM           Index LA diam:        4.40 cm 2.20 cm/m   RA Area:     16.80 cm LA Vol (A2C):   89.5 ml 44.75 ml/m  RA Volume:   44.80 ml  22.40 ml/m LA Vol (A4C):   76.1 ml 38.05 ml/m LA Biplane Vol: 81.1 ml 40.55 ml/m  AORTIC VALVE                     PULMONIC VALVE AV Area (Vmax):    1.58 cm      PV Vmax:       1.20 m/s AV Area (Vmean):   1.59 cm      PV Peak grad:  5.8 mmHg AV Area (VTI):     1.57 cm AV Vmax:           179.00 cm/s AV Vmean:          120.000 cm/s AV VTI:            0.409 m AV Peak Grad:      12.8 mmHg AV Mean Grad:      7.0 mmHg LVOT Vmax:         100.00 cm/s LVOT Vmean:        67.200 cm/s LVOT VTI:          0.227 m LVOT/AV VTI ratio: 0.56  AORTA Ao Root diam: 3.40 cm MITRAL VALVE                TRICUSPID VALVE MV Area (PHT): 2.81 cm     TR Peak grad:   38.2 mmHg MV Area VTI:   1.97 cm     TR Vmax:        309.00 cm/s MV Peak grad:  4.0 mmHg MV Mean grad:  2.0 mmHg     SHUNTS MV Vmax:       1.00 m/s     Systemic VTI:  0.23 m MV Vmean:      60.2 cm/s    Systemic Diam: 1.90 cm MV Decel Time: 270 msec MV E velocity: 105.00 cm/s MV A velocity:  66.70 cm/s MV E/A ratio:  1.57 Gwyndolyn Kaufman MD Electronically signed by Gwyndolyn Kaufman MD Signature Date/Time: 01/16/2021/6:13:09 PM    Final     Symptomatic anemia # Anemia/symptomatic-question GI bleed [see below]-November 2022 nadir hemoglobin 7; s/p PRBC transfusion [in hospital].  Patient on p.o. iron-tolerating well.  Recommend continue p.o. iron.  Discussed with the patient daughter that if repeat labs show lower hemoglobin/would recommend IV iron infusion.  Discussed regarding infusion reactions with iron infusions.  #Etiology of iron deficient is unclear-however likely GI given history of tarry stools.  EGD colonoscopy negative-normal 2022.  Awaiting capsule study.   Thank you for allowing me to participate in the care of your pleasant patient. Please do not hesitate to contact me  with questions or concerns in the interim.  # DISPOSITION: # labs today- cbc/cmp/ldh; iron studies/ferritin # Follow up TBD-Dr.B  Addendum: Please inform patient's daughter that her hemoglobin is improved at 9.4.  Okay to hold off any iron infusion at this time.  Recommend that she takes iron pills at least 1 a day.  Recommend follow-up in 6 weeks or so- MD; labs-cbc/bmp; possible Venofer at that time.     All questions were answered. The patient knows to call the clinic with any problems, questions or concerns.      Cammie Sickle, MD 01/23/2021 1:06 PM

## 2021-02-02 ENCOUNTER — Encounter (HOSPITAL_COMMUNITY)
Admission: RE | Disposition: A | Discharge status: Home / Self Care | Payer: Self-pay | Source: Home / Self Care | Attending: Gastroenterology

## 2021-02-02 ENCOUNTER — Ambulatory Visit (HOSPITAL_COMMUNITY)
Admission: RE | Admit: 2021-02-02 | Discharge: 2021-02-02 | Disposition: A | Discharge status: Home / Self Care | Payer: Medicare Other | Attending: Gastroenterology | Admitting: Gastroenterology

## 2021-02-02 DIAGNOSIS — D509 Iron deficiency anemia, unspecified: Secondary | ICD-10-CM | POA: Diagnosis not present

## 2021-02-02 DIAGNOSIS — K2980 Duodenitis without bleeding: Secondary | ICD-10-CM | POA: Diagnosis not present

## 2021-02-02 HISTORY — PX: GIVENS CAPSULE STUDY: SHX5432

## 2021-02-02 SURGERY — IMAGING PROCEDURE, GI TRACT, INTRALUMINAL, VIA CAPSULE

## 2021-02-05 ENCOUNTER — Ambulatory Visit (INDEPENDENT_AMBULATORY_CARE_PROVIDER_SITE_OTHER): Payer: Medicare Other | Admitting: Gastroenterology

## 2021-02-05 ENCOUNTER — Encounter (INDEPENDENT_AMBULATORY_CARE_PROVIDER_SITE_OTHER): Payer: Self-pay | Admitting: Gastroenterology

## 2021-02-05 ENCOUNTER — Other Ambulatory Visit: Payer: Self-pay

## 2021-02-05 VITALS — BP 121/75 | HR 70 | Temp 97.8°F | Ht 66.0 in | Wt 165.2 lb

## 2021-02-05 DIAGNOSIS — D500 Iron deficiency anemia secondary to blood loss (chronic): Secondary | ICD-10-CM | POA: Diagnosis not present

## 2021-02-05 DIAGNOSIS — K5900 Constipation, unspecified: Secondary | ICD-10-CM | POA: Diagnosis not present

## 2021-02-05 NOTE — Progress Notes (Signed)
Referring Provider: [facility] * Primary Care Physician:  [name], MD Primary GI Physician: Jenetta Downer  Chief Complaint  Patient presents with   Hospitalization Follow-up    Pt arrives with daughter Mackenzie [name] as a new patient for a hospital follow up. Had givens capsule on 12/16. Wants to see if results are available.    HPI:   Mackenzie [name] is a 70 y.o. female with past medical history of arthritis, A fib, CHF, HLD, HTN.   Patient presenting today for hospital follow up, she is accompanied by her daughter Mackenzie [name], who helps provide history.  Patient presented to ED on 01/15/21 for near syncope and flu like symptoms as well as hgb of 6.9 on blood work done by PCP. She was found to have hgb of 7 on admission, down from 9.8 two weeks prior. Ferritin was 376, Iron 12, saturation 9%, B12 176, FOBT positive. BUN was WNL. She admitted to few weeks history of dark/black stools with last occurrence on 11/27 prior to admission. She denied any BRBPR, she also endorsed approximately 50 lbs weight loss over the past 2-3 years. She was taking no other NSAIDs besides $RemoveBefore'81mg'TnHDzqfNLDDXx$  asa and was also on Eliquis for hx of A fib. She was transfused 2 units PRBCs during admission with improvement of hgb to 9.2. She underwent EGD and colonoscopy while inpatient, with findings as listed below (no significant source of GI bleeding/anemia identified). She was recommended to have Capsule endoscopy on outpatient basis, this was performed on 12/16, but has not yet been read.   Last hgb was 9.4 on 01/23/21 with ferritin on 534, Iron 17, TIBC 143, and saturation of 12%. She is continued on Ferrous sulfate $RemoveBeforeD'325mg'fzKxhkvuQPMtKr$  daily and pantoprazole $RemoveBeforeDE'40mg'SjcughVQbYdPTmV$  daily.  She denies any rectal bleeding or black stools. She reports that she is only having a BM 1-2x/week and having to strain when she does go. She does not take anything for constipation, though she has increased her water intake. She does not like water and is trying to drink more  now, maybe 3 bottles per day. Her appetite is good. She is not having any fatigue or shortness. She denies any further syncopal episodes, no nausea, vomiting or abdominal pain.   NSAID use:no NSAIDs Social hx: no tobacco or etoh Fam hx:no CRC or other GI diseases.   Last Colonoscopy:01/19/21 hemorrhoids on perianal exam  - The examined portion of the ileum was normal. - The entire examined colon is normal. - Non-bleeding external hemorrhoids. - No specimens collected.  Last Endoscopy:01/17/21- Normal hypopharynx. - Normal esophagus. - Z-line regular, 37 cm from the incisors. - 2 cm hiatal hernia. - Congestive gastropathy. 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  Past Medical History:  Diagnosis Date   Arthritis    Atrial fibrillation (HCC)    CHF (congestive heart failure) (HCC)    HLD (hyperlipidemia)    Hypertension     Past Surgical History:  Procedure Laterality Date   COLONOSCOPY WITH PROPOFOL N/A 01/19/2021   hemorrhoids on perianal exam, examined portion ofileum normal, entire examined colon, normal. 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 no specimens   LEFT HEART CATH AND CORONARY ANGIOGRAPHY Right 05/06/2016   Procedure: Left Heart  Cath and Coronary Angiography;  Surgeon: [name], MD;  Location: [facility];  Service: Cardiovascular;  Laterality: Right;    Current Outpatient Medications  Medication Sig Dispense Refill   albuterol (PROVENTIL HFA;VENTOLIN HFA) 108 (90 Base) MCG/ACT inhaler Inhale 2 puffs into the lungs daily as needed for wheezing or shortness of breath. 1 Inhaler 5   apixaban (ELIQUIS) 5 MG TABS tablet  Take 1 tablet (5 mg total) by mouth 2 (two) times daily. 60 tablet 5   aspirin EC 81 MG tablet Take 81 mg by mouth daily.     atorvastatin (LIPITOR) 40 MG tablet Take 1 tablet (40 mg total) by mouth daily at 6 PM. 30 tablet 5   blood glucose meter kit and supplies KIT Dispense based on patient and insurance preference. Use up to four times daily as directed. 1 each 0   carvedilol (COREG) 12.5 MG tablet Take 1 tablet (12.5 mg total) by mouth 2 (two) times daily with a meal. 60 tablet 5   ferrous sulfate 325 (65 FE) MG tablet Take 1 tablet (325 mg total) by mouth daily. 30 tablet 3   guaiFENesin-dextromethorphan (ROBITUSSIN DM) 100-10 MG/5ML syrup Take 5 mLs by mouth every 4 (four) hours as needed for cough. 118 mL 0   pantoprazole (PROTONIX) 40 MG tablet Take 1 tablet (40 mg total) by mouth daily. 30 tablet 1   sacubitril-valsartan (ENTRESTO) 24-26 MG Take 1 tablet by mouth 2 (two) times daily. 60 tablet 3   spironolactone (ALDACTONE) 25 MG tablet Take 1 tablet (25 mg total) by mouth daily. 30 tablet 5   vitamin B-12 (CYANOCOBALAMIN) 1000 MCG tablet Take 1 tablet (1,000 mcg total) by mouth daily. 30 tablet 0   No current facility-administered medications for this visit.    Allergies as of 02/05/2021   (No Known Allergies)    Family History  Problem Relation Age of Onset   Diabetes Mother    Heart disease Mother    Heart attack Father    Diabetes Brother    Breast cancer Maternal Grandmother    Breast cancer Maternal Aunt    Colon cancer Neg Hx    Colon polyps Neg Hx     Social History   Socioeconomic History   Marital status: Widowed    Spouse name: Not on file   Number of children: Not on file   Years of education: Not on file   Highest education level: Not on file  Occupational History   Not on file  Tobacco Use   Smoking status: Never   Smokeless tobacco: Never  Vaping Use   Vaping Use: Never used  Substance and Sexual Activity   Alcohol use: No   Drug use: No    Sexual activity: Not on file  Other Topics Concern   Not on file  Social History Narrative   Not on file   Social Determinants of Health   Financial Resource Strain: Not on file  Food Insecurity: Not on file  Transportation Needs: Not on file  Physical Activity: Not on file  Stress: Not on file  Social Connections: Not on file   Review of systems General: negative for malaise, night sweats, fever, chills, weight loss Neck: Negative for lumps, goiter, pain and significant neck swelling Resp: Negative for cough, wheezing, dyspnea at rest CV: Negative for chest pain, leg swelling, palpitations, orthopnea GI: denies melena, hematochezia, nausea, vomiting, diarrhea, dysphagia, odyonophagia, early satiety or unintentional weight loss. +constipation MSK: Negative for joint  pain or swelling, back pain, and muscle pain. Derm: Negative for itching or rash Psych: Denies depression, anxiety, memory loss, confusion. No homicidal or suicidal ideation.  Heme: Negative for prolonged bleeding, bruising easily, and swollen nodes. Endocrine: Negative for cold or heat intolerance, polyuria, polydipsia and goiter. Neuro: negative for tremor, gait imbalance, syncope and seizures. The remainder of the review of systems is noncontributory.  Physical Exam: BP 121/75 (BP Location: Right Arm, Patient Position: Sitting, Cuff Size: Large)    Pulse 70    Temp 97.8 F (36.6 C) (Oral)    Ht $R'5\' 6"'lQ$  (1.676 m)    Wt 165 lb 3.2 oz (74.9 kg)    BMI 26.66 kg/m  General:   Alert and oriented. No distress noted. Pleasant and cooperative.  Head:  Normocephalic and atraumatic. Eyes:  Conjuctiva clear without scleral icterus. Mouth:  Oral mucosa pink and moist. Good dentition. No lesions. Heart: normal rate, rhythm irregular with hx of a fib Lungs: Clear lung sounds in all lobes. Respirations equal and unlabored. Abdomen:  +BS, soft, non-tender and non-distended. No rebound or guarding. No HSM or masses noted. Derm: No  palmar erythema or jaundice Msk:  Symmetrical without gross deformities. Normal posture. Extremities:  Without edema. Neurologic:  Alert and  oriented x4 Psych:  Alert and cooperative. Normal mood and affect.  Invalid input(s): 6 MONTHS   ASSESSMENT: KIM OKI is a 70 y.o. female presenting today for hospital follow up of anemia/melena.  Patient reports she is doing well today. She is accompanied by her daughter who helps provide history. EGD and colonoscopy during admission in December were without significant findings of source of anemia/GI bleeding. She has had no rectal bleeding, melena, abdominal pain, fatigue, dizziness, sob or syncope since her discharge. Capsule study will be read by Dr. Jenetta Downer tomorrow with further recommendations to follow. She should continue PPI daily at this time as well as ferrous sulfate $RemoveBeforeD'325mg'BoydosRCDkfYeJ$  daily. Differentials include small bowel AVMs, small bowel ulcers, polyps, or less likely malignancy. She will let me know if she develops rectal bleeding, black stools, fatigue, dizziness, syncope or shortness of breath. We will update CBC to ensure hemoglobin is continuing to trend up. Further recommendations to follow after capsule study is ready tomorrow.   She is having some issues with constipation, with a BM only 1-2x per week. She is not taking anything for constipation, has increased her water intake to about 3 bottles per day. I encouraged her to continue with good water intake, increase fruits/veggies especially kiwi and prune and whole grains. She can also try otc miralax, instructions for dosing provided in AVS.   PLAN:  Capsule study to be read tomorrow 2. Continue ferrous sulfate $RemoveBeforeD'325mg'QaVGvEmRWXyspH$  daily 3. Continue PPI daily 4. Recheck CBC today 5. Start miralax for constipation, increase water, fruits/veggies/whole grains  Follow Up: 3 months  Meigan Pates L. Alver Sorrow, MSN, APRN, AGNP-C Adult-Gerontology Nurse Practitioner [facility]

## 2021-02-05 NOTE — Patient Instructions (Signed)
Dr. Jenetta Downer will be in touch after he reads your capsule study tomorrow Please continue pantoprazole 40mg  once daily Please continue your iron pills once daily. Continue to drink plenty of water and eat a diet high in fruits and veggies We will check your blood counts today to ensure they are still continuing to go back up If you have rectal bleeding, black stools, fatigue, dizziness or shortness of breath, please let us know  You can also try miralax for constipation: Start taking Miralax 1 capful every day for one week. If bowel movements do not improve, increase to 1 capful every 12 hours. If after two weeks there is no improvement, increase to 1 capful every 8 hours   Follow up in 3 months

## 2021-02-06 NOTE — Procedures (Addendum)
Small Bowel Givens Capsule Study Procedure date:  02/02/2021  Referring Provider:  PCP PCP:  Dr. [name], MD  Indication for procedure:  Iron deficiency anemia  Findings:  Adequate study as there was presence of adequate prep and the endoscopic capsule reached the cecum.  There was presence of mild duodenitis in first portion of duodenum characterized by mild erythema and congestion but no other alterations. There was no presence of hematin in the GI tract or other lesions.   First Gastric image:  00:01:06 First Duodenal image: 00:13:44 First Cecal image: 02:16:36 Gastric Passage time: 0h 16m Small Bowel Passage time:  2h 71m    Summary & Recommendations: There was presence of mild duodenitis in first portion of duodenum characterized by mild erythema and congestion but no other alterations. There was no presence of hematin in the GI tract or other lesions.  Duodenitis may improve with PPI. Will need to follow up with hematology regarding management of profound iron deficiency.  - Continue pantoprazole 40 mg qday - Continue ferrous sulfate supplementation daily - Referral to hematology  I personally communicated these recommendations to the patient's daughter.  Maylon Peppers, MD Gastroenterology and Hepatology [facility]

## 2021-02-13 ENCOUNTER — Other Ambulatory Visit: Payer: Self-pay

## 2021-02-13 ENCOUNTER — Telehealth: Payer: Self-pay | Admitting: *Deleted

## 2021-02-13 DIAGNOSIS — D649 Anemia, unspecified: Secondary | ICD-10-CM

## 2021-02-13 NOTE — Telephone Encounter (Signed)
Spoke to daughter, Amy. Per Amy, this is not an active bleed. Bleeding happened on Monday and lasted for a short period, then stopped. EMS was called and came to evaluate patient. Daughter mainly concerned about blood counts. Lab appointment offered, and daughter agreed. Appointment scheduled. Advised to call EMS again or go to urgent care if bleeding returns and does not stop.

## 2021-02-13 NOTE — Telephone Encounter (Addendum)
Daughter called reporting that patient had to come off of her iron while she had a procedure and that her hgb dropped to 8.7 and now she is having nose bleeds and bright red streaks of blood in her mucous. Please advise

## 2021-02-14 ENCOUNTER — Ambulatory Visit
Admission: RE | Admit: 2021-02-14 | Discharge: 2021-02-14 | Disposition: A | Discharge status: Home / Self Care | Payer: Medicare Other | Source: Ambulatory Visit | Attending: Oncology | Admitting: Oncology

## 2021-02-14 ENCOUNTER — Other Ambulatory Visit: Payer: Self-pay

## 2021-02-14 ENCOUNTER — Inpatient Hospital Stay: Payer: Medicare Other

## 2021-02-14 ENCOUNTER — Telehealth: Payer: Self-pay | Admitting: Oncology

## 2021-02-14 ENCOUNTER — Inpatient Hospital Stay (HOSPITAL_BASED_OUTPATIENT_CLINIC_OR_DEPARTMENT_OTHER): Payer: Medicare Other | Admitting: Oncology

## 2021-02-14 VITALS — BP 131/72 | HR 70 | Temp 97.8°F | Resp 16

## 2021-02-14 DIAGNOSIS — D649 Anemia, unspecified: Secondary | ICD-10-CM

## 2021-02-14 DIAGNOSIS — R042 Hemoptysis: Secondary | ICD-10-CM

## 2021-02-14 LAB — CBC WITH DIFFERENTIAL/PLATELET
Abs Immature Granulocytes: 0.01 10*3/uL (ref 0.00–0.07)
Basophils Absolute: 0 10*3/uL (ref 0.0–0.1)
Basophils Relative: 1 %
Eosinophils Absolute: 0.1 10*3/uL (ref 0.0–0.5)
Eosinophils Relative: 2 %
HCT: 23.2 % — ABNORMAL LOW (ref 36.0–46.0)
Hemoglobin: 7.2 g/dL — ABNORMAL LOW (ref 12.0–15.0)
Immature Granulocytes: 0 %
Lymphocytes Relative: 33 %
Lymphs Abs: 1.3 10*3/uL (ref 0.7–4.0)
MCH: 27.3 pg (ref 26.0–34.0)
MCHC: 31 g/dL (ref 30.0–36.0)
MCV: 87.9 fL (ref 80.0–100.0)
Monocytes Absolute: 0.2 10*3/uL (ref 0.1–1.0)
Monocytes Relative: 6 %
Neutro Abs: 2.3 10*3/uL (ref 1.7–7.7)
Neutrophils Relative %: 58 %
Platelets: 261 10*3/uL (ref 150–400)
RBC: 2.64 MIL/uL — ABNORMAL LOW (ref 3.87–5.11)
RDW: 16.4 % — ABNORMAL HIGH (ref 11.5–15.5)
WBC: 3.9 10*3/uL — ABNORMAL LOW (ref 4.0–10.5)
nRBC: 0 % (ref 0.0–0.2)

## 2021-02-14 LAB — COMPREHENSIVE METABOLIC PANEL
ALT: 12 U/L (ref 0–44)
AST: 15 U/L (ref 15–41)
Albumin: 3.1 g/dL — ABNORMAL LOW (ref 3.5–5.0)
Alkaline Phosphatase: 58 U/L (ref 38–126)
Anion gap: 12 (ref 5–15)
BUN: 40 mg/dL — ABNORMAL HIGH (ref 8–23)
CO2: 24 mmol/L (ref 22–32)
Calcium: 8.5 mg/dL — ABNORMAL LOW (ref 8.9–10.3)
Chloride: 101 mmol/L (ref 98–111)
Creatinine, Ser: 2.23 mg/dL — ABNORMAL HIGH (ref 0.44–1.00)
GFR, Estimated: 23 mL/min — ABNORMAL LOW (ref >60)
Glucose, Bld: 98 mg/dL (ref 70–99)
Potassium: 4.5 mmol/L (ref 3.5–5.1)
Sodium: 137 mmol/L (ref 135–145)
Total Bilirubin: 0.6 mg/dL (ref 0.3–1.2)
Total Protein: 7.6 g/dL (ref 6.5–8.1)

## 2021-02-14 LAB — PROTIME-INR
INR: 1.7 — ABNORMAL HIGH (ref 0.8–1.2)
Prothrombin Time: 19.5 seconds — ABNORMAL HIGH (ref 11.4–15.2)

## 2021-02-14 LAB — SAMPLE TO BLOOD BANK

## 2021-02-14 NOTE — Progress Notes (Signed)
Pt and daughter report that pt has had intermittent nose bleeds for the last few days. EMS came to home on 12/26 for a bleed, but did not transport pt, as bleeding had stopped. EMS provided pt with Saline spray. Last bleed was last night and lasted for about 10 minutes. Also reports intermittent headaches since the nose bleeds began.

## 2021-02-14 NOTE — Progress Notes (Signed)
[facility] NOTE  Patient Care Team: [name], MD as PCP - General (Cardiology) Alisa Graff, FNP as Nurse Practitioner (Family Medicine) [name], MD as Consulting Physician (Cardiology)  CHIEF COMPLAINTS/PURPOSE OF CONSULTATION: ANEMIA   HEMATOLOGY HISTORY:  # ANEMIA [NOV 2022 ; syncope hemoglobin 7 [facility]-FOBT positive EGD/ colonoscopy-no source of bleeding noted; capsule-pending on DEC 16th s/p previous epilepsy transfusion  # atrial fibrillation on anticoagulation , systolic CHF [Dr.Khan] diabetes  HISTORY OF PRESENTING ILLNESS: Mackenzie [name] is a 70 year old female with above history who presents today for new onset nosebleeds.  Per daughter, over the past few days patient has developed nosebleeds that have been difficult to stop. Mackenzie [name] also has been having several episodes of coughing up bright red blood with some clots. Denies chest pain or sob.  Patient denies any additional bleeding.  Otherwise, patient feels fine.  States Mackenzie [name] does not feel tired, weak or short of breath.  Denies any chest pain.  Was evaluated for GI bleed and has had an EGD/colonoscopy and most recently had video capsule study which were all negative.  Review of Systems  HENT:  Positive for nosebleeds.   Respiratory:  Positive for hemoptysis.    MEDICAL HISTORY:  Past Medical History:  Diagnosis Date   Arthritis    Atrial fibrillation (HCC)    CHF (congestive heart failure) (HCC)    HLD (hyperlipidemia)    Hypertension     SURGICAL HISTORY: Past Surgical History:  Procedure Laterality Date   COLONOSCOPY WITH PROPOFOL N/A 01/19/2021   hemorrhoids on perianal exam, examined portion ofileum normal, entire examined colon, normal. 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 no specimens   GIVENS CAPSULE STUDY N/A 02/02/2021   Procedure: GIVENS CAPSULE STUDY;  Surgeon: Harvel Quale, MD;  Location: [facility];  Service: Gastroenterology;  Laterality: N/A;  7:30   LEFT HEART CATH AND CORONARY ANGIOGRAPHY Right 05/06/2016   Procedure: Left Heart Cath and Coronary Angiography;  Surgeon: [name], MD;  Location: [facility];  Service: Cardiovascular;  Laterality: Right;    SOCIAL HISTORY: Social History   Socioeconomic History   Marital status: Widowed    Spouse name: Not on file   Number of children: Not on file   Years of education: Not on file   Highest education level: Not on file  Occupational History   Not on file  Tobacco Use   Smoking status: Never   Smokeless tobacco: Never  Vaping Use   Vaping Use: Never used  Substance and Sexual Activity   Alcohol use: No   Drug use: No   Sexual activity: Not on file  Other Topics Concern   Not on file  Social History Narrative   Not on file   Social Determinants of Health   Financial Resource Strain: Not on file  Food Insecurity: Not on file  Transportation Needs: Not on file  Physical Activity: Not on file  Stress: Not on file  Social Connections: Not on file  Intimate Partner Violence: Not on file    FAMILY HISTORY: Family History  Problem Relation Age of Onset   Diabetes Mother    Heart disease Mother    Heart attack Father    Diabetes Brother  Breast cancer Maternal Grandmother    Breast cancer Maternal Aunt    Colon cancer Neg Hx    Colon polyps Neg Hx     ALLERGIES:  has No Known Allergies.  MEDICATIONS:  Current Outpatient Medications  Medication Sig Dispense Refill   albuterol (PROVENTIL HFA;VENTOLIN HFA) 108 (90 Base) MCG/ACT inhaler Inhale 2 puffs into the lungs daily as needed for wheezing or shortness of breath. 1 Inhaler 5   apixaban (ELIQUIS) 5 MG TABS tablet Take 1 tablet (5 mg total) by  mouth 2 (two) times daily. 60 tablet 5   aspirin EC 81 MG tablet Take 81 mg by mouth daily.     atorvastatin (LIPITOR) 40 MG tablet Take 1 tablet (40 mg total) by mouth daily at 6 PM. 30 tablet 5   blood glucose meter kit and supplies KIT Dispense based on patient and insurance preference. Use up to four times daily as directed. 1 each 0   carvedilol (COREG) 12.5 MG tablet Take 1 tablet (12.5 mg total) by mouth 2 (two) times daily with a meal. 60 tablet 5   ferrous sulfate 325 (65 FE) MG tablet Take 1 tablet (325 mg total) by mouth daily. 30 tablet 3   guaiFENesin-dextromethorphan (ROBITUSSIN DM) 100-10 MG/5ML syrup Take 5 mLs by mouth every 4 (four) hours as needed for cough. 118 mL 0   pantoprazole (PROTONIX) 40 MG tablet Take 1 tablet (40 mg total) by mouth daily. 30 tablet 1   sacubitril-valsartan (ENTRESTO) 24-26 MG Take 1 tablet by mouth 2 (two) times daily. 60 tablet 3   spironolactone (ALDACTONE) 25 MG tablet Take 1 tablet (25 mg total) by mouth daily. 30 tablet 5   vitamin B-12 (CYANOCOBALAMIN) 1000 MCG tablet Take 1 tablet (1,000 mcg total) by mouth daily. 30 tablet 0   No current facility-administered medications for this visit.      PHYSICAL EXAMINATION:   Vitals:   02/14/21 1419  BP: 131/72  Pulse: 70  Resp: 16  Temp: 97.8 F (36.6 C)  SpO2: 100%   There were no vitals filed for this visit.   Physical Exam Constitutional:      Appearance: Normal appearance.  HENT:     Head: Normocephalic and atraumatic.  Eyes:     Pupils: Pupils are equal, round, and reactive to light.  Cardiovascular:     Rate and Rhythm: Normal rate and regular rhythm.     Heart sounds: Normal heart sounds. No murmur heard. Pulmonary:     Effort: Pulmonary effort is normal.     Breath sounds: Normal breath sounds. No wheezing.  Abdominal:     General: Bowel sounds are normal. There is no distension.     Palpations: Abdomen is soft.     Tenderness: There is no abdominal tenderness.   Musculoskeletal:        General: Normal range of motion.     Cervical back: Normal range of motion.  Skin:    General: Skin is warm and dry.     Findings: No rash.  Neurological:     Mental Status: Mackenzie [name] is alert and oriented to person, place, and time.     Gait: Gait is intact.  Psychiatric:        Mood and Affect: Mood and affect normal.        Cognition and Memory: Memory normal.        Judgment: Judgment normal.    LABORATORY DATA:  I have reviewed the data as listed Lab Results  Component Value Date   WBC 3.9 (L) 02/14/2021   HGB 7.2 (L) 02/14/2021   HCT 23.2 (L) 02/14/2021   MCV 87.9 02/14/2021   PLT 261 02/14/2021   Recent Labs    01/15/21 1458 01/16/21 0946 01/17/21 0449 01/19/21 0512 01/20/21 0359 01/23/21 1211  NA 131* 131*   < > 131* 132* 132*  K 3.4* 3.9   < > 4.0 4.1 4.2  CL 102 105   < > 101 101 97*  CO2 20* 21*   < > _0 GLUCOSE 126* 163*   < > 93 96 100*  BUN 18 18   < > _1 CREATININE 1.05* 1.13*   < > 1.29* 1.29* 1.18*  CALCIUM 7.3* 7.4*   < > 7.9* 7.6* 8.2*  GFRNONAA 57* 52*   < > 45* 45* 50*  PROT 6.3* 5.9*  --   --   --  7.8  ALBUMIN 2.4* 2.2*  --   --   --  2.8*  AST 17 17  --   --   --  17  ALT 10 10  --   --   --  10  ALKPHOS 37* 36*  --   --   --  49  BILITOT 0.9 0.7  --   --   --  0.7   < > = values in this interval not displayed.      DG Chest Port 1 View  Result Date: 01/15/2021 CLINICAL DATA:  Near syncopal episode in flu like symptoms. Hemoglobin of 6.9. History of CHF with cough for 1 week. EXAM: PORTABLE CHEST 1 VIEW COMPARISON:  March of 2018. FINDINGS: EKG leads project over the chest. Cardiac enlargement and fullness of the RIGHT and LEFT hilum is a stable finding based on comparison with previous imaging. There is no lobar consolidative process. No sign of pleural effusion on frontal radiograph or pneumothorax. Subtle opacities scattered throughout the upper and mid chest bilaterally suggested on the current  study. On limited assessment there is no acute skeletal process. IMPRESSION: Cardiomegaly with central pulmonary vascular engorgement. Subtle opacities scattered throughout the upper and mid chest bilaterally, findings could represent asymmetric edema versus is atypical infection, consider follow-up PA and lateral chest radiograph when the patient is able to ensure resolution. Electronically Signed   By: Zetta Bills M.D.   On: 01/15/2021 15:35   ECHOCARDIOGRAM COMPLETE  Result Date: 01/16/2021    ECHOCARDIOGRAM REPORT   Patient Name:   Mackenzie [name] Date of Exam: 01/16/2021 Medical Rec #:  [id number]      Height:       66.0 in Accession #:    [id number]     Weight:       200.0 lb Date of Birth:  [birth year]       BSA:          2.000 m Patient Age:    84 years       BP:           119/61 mmHg Patient Gender: F              HR:           74 bpm. Exam Location:  [facility] Procedure: 2D Echo, Cardiac Doppler and Color Doppler Indications:    Syncope  History:        Patient has prior history of Echocardiogram examinations, most  recent 05/01/2016. CHF, Arrythmias:Atrial Fibrillation,                 Signs/Symptoms:Syncope; Risk Factors:Hypertension.  Sonographer:    Wenda Low Referring Phys: [place]  1. Left ventricular ejection fraction, by estimation, is 60 to 65%. The left ventricle has normal function. The left ventricle has no regional wall motion abnormalities. There is moderate asymmetric left ventricular hypertrophy of the basal-septal segment. Left ventricular diastolic parameters are consistent with Grade II diastolic dysfunction (pseudonormalization).  2. Right ventricular systolic function is normal. The right ventricular size is normal. There is mildly elevated pulmonary artery systolic pressure. The estimated right ventricular systolic pressure is 68.0 mmHg.  3. Left atrial size was moderately dilated.  4. The mitral valve is abnormal. Trivial mitral  valve regurgitation.  5. The aortic valve is tricuspid. Aortic valve regurgitation is mild. Aortic valve sclerosis/calcification is present, without any evidence of aortic stenosis. There is mild calcification of the aortic valve leaflets most notably on the [place].  6. The inferior vena cava is normal in size with greater than 50% respiratory variability, suggesting right atrial pressure of 3 mmHg. Comparison(s): No prior Echocardiogram. FINDINGS  Left Ventricle: Left ventricular ejection fraction, by estimation, is 60 to 65%. The left ventricle has normal function. The left ventricle has no regional wall motion abnormalities. The left ventricular internal cavity size was normal in size. There is  moderate asymmetric left ventricular hypertrophy of the basal-septal segment. Left ventricular diastolic parameters are consistent with Grade II diastolic dysfunction (pseudonormalization). Right Ventricle: The right ventricular size is normal. No increase in right ventricular wall thickness. Right ventricular systolic function is normal. There is mildly elevated pulmonary artery systolic pressure. The tricuspid regurgitant velocity is 3.09  m/s, and with an assumed right atrial pressure of 3 mmHg, the estimated right ventricular systolic pressure is 32.1 mmHg. Left Atrium: Left atrial size was moderately dilated. Right Atrium: Right atrial size was normal in size. Pericardium: There is no evidence of pericardial effusion. Mitral Valve: The mitral valve is abnormal. There is mild thickening of the mitral valve leaflet(s). There is mild calcification of the mitral valve leaflet(s). Mild to moderate mitral annular calcification. Trivial mitral valve regurgitation. MV peak gradient, 4.0 mmHg. The mean mitral valve gradient is 2.0 mmHg. Tricuspid Valve: The tricuspid valve is normal in structure. Tricuspid valve regurgitation is mild. Aortic Valve: There is calcifcation present most notably on the [place]. The aortic valve is  tricuspid. Aortic valve regurgitation is mild. Aortic valve sclerosis/calcification is present, without any evidence of aortic stenosis. Aortic valve mean gradient measures 7.0 mmHg. Aortic valve peak gradient measures 12.8 mmHg. Aortic valve area, by VTI measures 1.57 cm. Pulmonic Valve: The pulmonic valve was normal in structure. Pulmonic valve regurgitation is mild. Aorta: The aortic root is normal in size and structure. Venous: The inferior vena cava is normal in size with greater than 50% respiratory variability, suggesting right atrial pressure of 3 mmHg. IAS/Shunts: No atrial level shunt detected by color flow Doppler.  LEFT VENTRICLE PLAX 2D LVIDd:         5.40 cm     Diastology LVIDs:         3.20 cm     LV e' medial:    5.29 cm/s LV PW:         0.90 cm     LV E/e' medial:  19.8 LV IVS:        1.20 cm  LV e' lateral:   6.73 cm/s LVOT diam:     1.90 cm     LV E/e' lateral: 15.6 LV SV:         64 LV SV Index:   32 LVOT Area:     2.84 cm  LV Volumes (MOD) LV vol d, MOD A2C: 88.3 ml LV vol d, MOD A4C: 60.5 ml LV vol s, MOD A2C: 25.1 ml LV vol s, MOD A4C: 27.3 ml LV SV MOD A2C:     63.2 ml LV SV MOD A4C:     60.5 ml LV SV MOD BP:      47.8 ml RIGHT VENTRICLE RV Basal diam:  3.75 cm RV Mid diam:    3.80 cm RV S prime:     15.20 cm/s TAPSE (M-mode): 2.6 cm LEFT ATRIUM             Index        RIGHT ATRIUM           Index LA diam:        4.40 cm 2.20 cm/m   RA Area:     16.80 cm LA Vol (A2C):   89.5 ml 44.75 ml/m  RA Volume:   44.80 ml  22.40 ml/m LA Vol (A4C):   76.1 ml 38.05 ml/m LA Biplane Vol: 81.1 ml 40.55 ml/m  AORTIC VALVE                     PULMONIC VALVE AV Area (Vmax):    1.58 cm      PV Vmax:       1.20 m/s AV Area (Vmean):   1.59 cm      PV Peak grad:  5.8 mmHg AV Area (VTI):     1.57 cm AV Vmax:           179.00 cm/s AV Vmean:          120.000 cm/s AV VTI:            0.409 m AV Peak Grad:      12.8 mmHg AV Mean Grad:      7.0 mmHg LVOT Vmax:         100.00 cm/s LVOT Vmean:        67.200  cm/s LVOT VTI:          0.227 m LVOT/AV VTI ratio: 0.56  AORTA Ao Root diam: 3.40 cm MITRAL VALVE                TRICUSPID VALVE MV Area (PHT): 2.81 cm     TR Peak grad:   38.2 mmHg MV Area VTI:   1.97 cm     TR Vmax:        309.00 cm/s MV Peak grad:  4.0 mmHg MV Mean grad:  2.0 mmHg     SHUNTS MV Vmax:       1.00 m/s     Systemic VTI:  0.23 m MV Vmean:      60.2 cm/s    Systemic Diam: 1.90 cm MV Decel Time: 270 msec MV E velocity: 105.00 cm/s MV A velocity: 66.70 cm/s MV E/A ratio:  1.57 Gwyndolyn Kaufman MD Electronically signed by Gwyndolyn Kaufman MD Signature Date/Time: 01/16/2021/6:13:09 PM    Final     No problem-specific Assessment & Plan notes found for this encounter.  Mackenzie [name] is here for an acute add-on for frequent nosebleeds and hemoptysis over the pasr 2-3 days. Denies any additonal bleeding.  Mackenzie [name] was last seen a few weeks ago by Dr. Rogue Bussing for symptomatic anemia with a hemoglobin of 9.6.  Mackenzie [name] has required at least 1 unit PRBC while hospitalized in November for hemoglobin of 7. Mackenzie [name] is currently not on iron supplements as her numbers had improved. Labs from today so a significant decline in her hemoglobin from 9.4 to 7.2. No on blood thinners. Discussed with Dr. Rogue Bussing and patient to have CTA to rule out PE and 1 unit PRBC when available. Unable to perform CTA d/t renal fucntion. Patient to have VQ scan in the morning. Mackenzie [name] will have 1 unit PRBC on Friday. Orders placed. Will call with results.   Disposition- VQ scan tomorrow.  Blood transfusion on Friday.  RTC in 1 month for repeat labs and assessment with Dr. Rogue Bussing.  Addendum- Poor Venous Access- During VQ scan patient required over 8 attempts to get a patent IV. Spoke with Radiology and arranged for patient to have IV left in place for blood transfusion tomorrow.   VQ scan negative for PE. Chest X-ray negative. Patient notified of results.   I spent 45 minutes dedicated to the care of this patient (face-to-face and  non-face-to-face) on the date of the encounter to include what is described in the assessment and plan.  All questions were answered. The patient knows to call the clinic with any problems, questions or concerns.      Jacquelin Hawking, NP 02/14/2021 2:24 PM

## 2021-02-14 NOTE — Telephone Encounter (Signed)
Spoke with daughter about addition of labs prior to her blood transfusion on 12/30. Daughter was agreeable and will let patient know.

## 2021-02-15 ENCOUNTER — Encounter: Payer: Self-pay | Admitting: Internal Medicine

## 2021-02-15 ENCOUNTER — Other Ambulatory Visit: Payer: Self-pay | Admitting: Oncology

## 2021-02-15 ENCOUNTER — Ambulatory Visit
Admission: RE | Admit: 2021-02-15 | Discharge: 2021-02-15 | Disposition: A | Discharge status: Home / Self Care | Payer: Medicare Other | Source: Ambulatory Visit | Attending: Oncology | Admitting: Oncology

## 2021-02-15 ENCOUNTER — Other Ambulatory Visit: Payer: Self-pay | Admitting: Nurse Practitioner

## 2021-02-15 DIAGNOSIS — R042 Hemoptysis: Secondary | ICD-10-CM

## 2021-02-15 LAB — PREPARE RBC (CROSSMATCH)

## 2021-02-15 MED ORDER — TECHNETIUM TO 99M ALBUMIN AGGREGATED
4.0000 | Freq: Once | INTRAVENOUS | Status: AC | PRN
  Administered 2021-02-15: 11:00:00 3.95 via INTRAVENOUS

## 2021-02-16 ENCOUNTER — Inpatient Hospital Stay: Payer: Medicare Other

## 2021-02-16 ENCOUNTER — Other Ambulatory Visit: Payer: Self-pay

## 2021-02-16 DIAGNOSIS — D649 Anemia, unspecified: Secondary | ICD-10-CM

## 2021-02-16 LAB — IRON AND TIBC
Iron: 55 ug/dL (ref 28–170)
Saturation Ratios: 24 % (ref 10.4–31.8)
TIBC: 234 ug/dL — ABNORMAL LOW (ref 250–450)
UIBC: 179 ug/dL

## 2021-02-16 LAB — FERRITIN: Ferritin: 350 ng/mL — ABNORMAL HIGH (ref 11–307)

## 2021-02-16 MED ORDER — ACETAMINOPHEN 325 MG PO TABS
650.0000 mg | ORAL_TABLET | Freq: Once | ORAL | Status: AC
  Administered 2021-02-16: 10:00:00 650 mg via ORAL
  Filled 2021-02-16: qty 2

## 2021-02-16 MED ORDER — SODIUM CHLORIDE 0.9% IV SOLUTION
250.0000 mL | Freq: Once | INTRAVENOUS | Status: AC
  Administered 2021-02-16: 10:00:00 250 mL via INTRAVENOUS
  Filled 2021-02-16: qty 250

## 2021-02-16 MED ORDER — DIPHENHYDRAMINE HCL 25 MG PO CAPS: 25.0000 mg | ORAL_CAPSULE | Freq: Once | ORAL | Status: DC

## 2021-02-16 NOTE — Patient Instructions (Signed)
[facility] [place]  Discharge Instructions: Thank you for choosing [facility] to provide your oncology and hematology care.  If you have a lab appointment with the [facility], please go directly to the [facility] and check in at the registration area.  Wear comfortable clothing and clothing appropriate for easy access to any Portacath or PICC line.   We strive to give you quality time with your provider. You may need to reschedule your appointment if you arrive late (15 or more minutes).  Arriving late affects you and other patients whose appointments are after yours.  Also, if you miss three or more appointments without notifying the office, you may be dismissed from the clinic at the providers discretion.      For prescription refill requests, have your pharmacy contact our office and allow 72 hours for refills to be completed.    Today you received the following chemotherapy and/or immunotherapy agents BLOOD      To help prevent nausea and vomiting after your treatment, we encourage you to take your nausea medication as directed.  BELOW ARE SYMPTOMS THAT SHOULD BE REPORTED IMMEDIATELY: *FEVER GREATER THAN 100.4 F (38 C) OR HIGHER *CHILLS OR SWEATING *NAUSEA AND VOMITING THAT IS NOT CONTROLLED WITH YOUR NAUSEA MEDICATION *UNUSUAL SHORTNESS OF BREATH *UNUSUAL BRUISING OR BLEEDING *URINARY PROBLEMS (pain or burning when urinating, or frequent urination) *BOWEL PROBLEMS (unusual diarrhea, constipation, pain near the anus) TENDERNESS IN MOUTH AND THROAT WITH OR WITHOUT PRESENCE OF ULCERS (sore throat, sores in mouth, or a toothache) UNUSUAL RASH, SWELLING OR PAIN  UNUSUAL VAGINAL DISCHARGE OR ITCHING   Items with * indicate a potential emergency and should be followed up as soon as possible or go to the Emergency Department if any problems should occur.  Please show the CHEMOTHERAPY ALERT CARD or IMMUNOTHERAPY ALERT CARD at check-in to the  Emergency Department and triage nurse.  Should you have questions after your visit or need to cancel or reschedule your appointment, please contact [facility] [place] [facility] [place]  [phone] and follow the prompts.  Office hours are 8:00 a.m. to 4:30 p.m. Monday - Friday. Please note that voicemails left after 4:00 p.m. may not be returned until the following business day.  We are closed weekends and major holidays. You have access to a nurse at all times for urgent questions. Please call the main number to the clinic [phone] and follow the prompts.  For any non-urgent questions, you may also contact your provider using MyChart. We now offer e-Visits for anyone 52 and older to request care online for non-urgent symptoms. For details visit [email or web address].   Also download the MyChart app! Go to the app store, search "MyChart", open the app, select [facility], and log in with your MyChart username and password.  Due to Covid, a mask is required upon entering the hospital/clinic. If you do not have a mask, one will be given to you upon arrival. For doctor visits, patients may have 1 support person aged 49 or older with them. For treatment visits, patients cannot have anyone with them due to current Covid guidelines and our immunocompromised population.   Blood Transfusion, Adult, Care After This sheet gives you information about how to care for yourself after your procedure. Your doctor may also give you more specific instructions. If you have problems or questions, contact your doctor. What can I expect after the procedure? After the procedure, it is common to have: Bruising and soreness  at the IV site. A headache. Follow these instructions at home: Insertion site care   Follow instructions from your doctor about how to take care of your insertion site. This is where an IV tube was put into your vein. Make sure you: Wash your hands with soap and water before and after  you change your bandage (dressing). If you cannot use soap and water, use hand sanitizer. Change your bandage as told by your doctor. Check your insertion site every day for signs of infection. Check for: Redness, swelling, or pain. Bleeding from the site. Warmth. Pus or a bad smell. General instructions Take over-the-counter and prescription medicines only as told by your doctor. Rest as told by your doctor. Go back to your normal activities as told by your doctor. Keep all follow-up visits as told by your doctor. This is important. Contact a doctor if: You have itching or red, swollen areas of skin (hives). You feel worried or nervous (anxious). You feel weak after doing your normal activities. You have redness, swelling, warmth, or pain around the insertion site. You have blood coming from the insertion site, and the blood does not stop with pressure. You have pus or a bad smell coming from the insertion site. Get help right away if: You have signs of a serious reaction. This may be coming from an allergy or the body's defense system (immune system). Signs include: Trouble breathing or shortness of breath. Swelling of the face or feeling warm (flushed). Fever or chills. Head, chest, or back pain. Dark pee (urine) or blood in the pee. Widespread rash. Fast heartbeat. Feeling dizzy or light-headed. You may receive your blood transfusion in an outpatient setting. If so, you will be told whom to contact to report any reactions. These symptoms may be an emergency. Do not wait to see if the symptoms will go away. Get medical help right away. Call your local emergency services (911 in the [place] Do not drive yourself to the hospital. Summary Bruising and soreness at the IV site are common. Check your insertion site every day for signs of infection. Rest as told by your doctor. Go back to your normal activities as told by your doctor. Get help right away if you have signs of a serious  reaction. This information is not intended to replace advice given to you by your health care provider. Make sure you discuss any questions you have with your health care provider. Document Revised: 06/01/2020 Document Reviewed: 07/30/2018 Elsevier Patient Education  [place].

## 2021-02-17 LAB — BPAM RBC
Blood Product Expiration Date: 202301202359
ISSUE DATE / TIME: 202212301056
Unit Type and Rh: 5100

## 2021-02-17 LAB — TYPE AND SCREEN
ABO/RH(D): O POS
Antibody Screen: NEGATIVE
Unit division: 0

## 2021-02-17 LAB — PTT FACTOR INHIBITOR (MIXING STUDY)
aPTT 1:1 NP Incub. Mix Ctl: 34.1 s — ABNORMAL HIGH (ref 22.9–30.2)
aPTT 1:1 NP Mix, 60 Min,Incub.: 35.2 s — ABNORMAL HIGH (ref 22.9–30.2)
aPTT 1:1 Normal Plasma: 30.2 s (ref 22.9–30.2)
aPTT: 39.8 s — ABNORMAL HIGH (ref 22.9–30.2)

## 2021-02-20 ENCOUNTER — Telehealth: Payer: Self-pay | Admitting: *Deleted

## 2021-02-20 DIAGNOSIS — Z809 Family history of malignant neoplasm, unspecified: Secondary | ICD-10-CM

## 2021-02-20 NOTE — Telephone Encounter (Signed)
Patient's daughter April called stating that she was speaking with her sister about patient and her sis (who is a Marine scientist) said that patient needs a PET Scan done and she is requesting that we order PET scan

## 2021-02-20 NOTE — Telephone Encounter (Signed)
Attempted to call Mackenzie [name] for more details regarding request for PET but call went to voicemail and unable to leave message due to voicemail box is full.

## 2021-02-21 NOTE — Telephone Encounter (Signed)
#   not working. 

## 2021-02-21 NOTE — Telephone Encounter (Signed)
No answer/vm

## 2021-02-22 ENCOUNTER — Encounter: Payer: Self-pay | Admitting: Internal Medicine

## 2021-02-22 NOTE — Addendum Note (Signed)
Addended by: Leeann Must on: 02/22/2021 02:29 PM   Modules accepted: Orders

## 2021-02-22 NOTE — Telephone Encounter (Signed)
Referral placed to genetics. 

## 2021-02-28 ENCOUNTER — Other Ambulatory Visit: Payer: Self-pay | Admitting: *Deleted

## 2021-02-28 DIAGNOSIS — D649 Anemia, unspecified: Secondary | ICD-10-CM

## 2021-03-05 ENCOUNTER — Other Ambulatory Visit: Payer: Self-pay

## 2021-03-05 ENCOUNTER — Inpatient Hospital Stay: Payer: Medicare Other

## 2021-03-05 ENCOUNTER — Inpatient Hospital Stay (HOSPITAL_BASED_OUTPATIENT_CLINIC_OR_DEPARTMENT_OTHER): Payer: Medicare Other | Admitting: Internal Medicine

## 2021-03-05 ENCOUNTER — Encounter: Payer: Self-pay | Admitting: Internal Medicine

## 2021-03-05 DIAGNOSIS — Z803 Family history of malignant neoplasm of breast: Secondary | ICD-10-CM | POA: Insufficient documentation

## 2021-03-05 DIAGNOSIS — Z8719 Personal history of other diseases of the digestive system: Secondary | ICD-10-CM | POA: Insufficient documentation

## 2021-03-05 DIAGNOSIS — R5383 Other fatigue: Secondary | ICD-10-CM | POA: Insufficient documentation

## 2021-03-05 DIAGNOSIS — I4891 Unspecified atrial fibrillation: Secondary | ICD-10-CM | POA: Insufficient documentation

## 2021-03-05 DIAGNOSIS — Z8249 Family history of ischemic heart disease and other diseases of the circulatory system: Secondary | ICD-10-CM | POA: Insufficient documentation

## 2021-03-05 DIAGNOSIS — R042 Hemoptysis: Secondary | ICD-10-CM | POA: Insufficient documentation

## 2021-03-05 DIAGNOSIS — E1122 Type 2 diabetes mellitus with diabetic chronic kidney disease: Secondary | ICD-10-CM | POA: Insufficient documentation

## 2021-03-05 DIAGNOSIS — Z79899 Other long term (current) drug therapy: Secondary | ICD-10-CM | POA: Insufficient documentation

## 2021-03-05 DIAGNOSIS — I130 Hypertensive heart and chronic kidney disease with heart failure and stage 1 through stage 4 chronic kidney disease, or unspecified chronic kidney disease: Secondary | ICD-10-CM | POA: Insufficient documentation

## 2021-03-05 DIAGNOSIS — R55 Syncope and collapse: Secondary | ICD-10-CM | POA: Diagnosis not present

## 2021-03-05 DIAGNOSIS — N179 Acute kidney failure, unspecified: Secondary | ICD-10-CM | POA: Diagnosis not present

## 2021-03-05 DIAGNOSIS — Z833 Family history of diabetes mellitus: Secondary | ICD-10-CM | POA: Insufficient documentation

## 2021-03-05 DIAGNOSIS — D649 Anemia, unspecified: Secondary | ICD-10-CM | POA: Diagnosis not present

## 2021-03-05 DIAGNOSIS — Z7901 Long term (current) use of anticoagulants: Secondary | ICD-10-CM | POA: Insufficient documentation

## 2021-03-05 DIAGNOSIS — N1831 Chronic kidney disease, stage 3a: Secondary | ICD-10-CM | POA: Insufficient documentation

## 2021-03-05 DIAGNOSIS — M47814 Spondylosis without myelopathy or radiculopathy, thoracic region: Secondary | ICD-10-CM | POA: Insufficient documentation

## 2021-03-05 DIAGNOSIS — R0602 Shortness of breath: Secondary | ICD-10-CM | POA: Insufficient documentation

## 2021-03-05 DIAGNOSIS — I5022 Chronic systolic (congestive) heart failure: Secondary | ICD-10-CM | POA: Insufficient documentation

## 2021-03-05 LAB — COMPREHENSIVE METABOLIC PANEL
ALT: 9 U/L (ref 0–44)
AST: 14 U/L — ABNORMAL LOW (ref 15–41)
Albumin: 3.4 g/dL — ABNORMAL LOW (ref 3.5–5.0)
Alkaline Phosphatase: 63 U/L (ref 38–126)
Anion gap: 10 (ref 5–15)
BUN: 62 mg/dL — ABNORMAL HIGH (ref 8–23)
CO2: 21 mmol/L — ABNORMAL LOW (ref 22–32)
Calcium: 8.3 mg/dL — ABNORMAL LOW (ref 8.9–10.3)
Chloride: 104 mmol/L (ref 98–111)
Creatinine, Ser: 4.94 mg/dL — ABNORMAL HIGH (ref 0.44–1.00)
GFR, Estimated: 9 mL/min — ABNORMAL LOW (ref >60)
Glucose, Bld: 101 mg/dL — ABNORMAL HIGH (ref 70–99)
Potassium: 4.9 mmol/L (ref 3.5–5.1)
Sodium: 135 mmol/L (ref 135–145)
Total Bilirubin: 0.6 mg/dL (ref 0.3–1.2)
Total Protein: 7.8 g/dL (ref 6.5–8.1)

## 2021-03-05 LAB — IRON AND TIBC
Iron: 67 ug/dL (ref 28–170)
Saturation Ratios: 30 % (ref 10.4–31.8)
TIBC: 225 ug/dL — ABNORMAL LOW (ref 250–450)
UIBC: 158 ug/dL

## 2021-03-05 LAB — CBC WITH DIFFERENTIAL/PLATELET
Abs Immature Granulocytes: 0.01 10*3/uL (ref 0.00–0.07)
Basophils Absolute: 0 10*3/uL (ref 0.0–0.1)
Basophils Relative: 1 %
Eosinophils Absolute: 0.1 10*3/uL (ref 0.0–0.5)
Eosinophils Relative: 2 %
HCT: 26.4 % — ABNORMAL LOW (ref 36.0–46.0)
Hemoglobin: 8.4 g/dL — ABNORMAL LOW (ref 12.0–15.0)
Immature Granulocytes: 0 %
Lymphocytes Relative: 18 %
Lymphs Abs: 1 10*3/uL (ref 0.7–4.0)
MCH: 28.2 pg (ref 26.0–34.0)
MCHC: 31.8 g/dL (ref 30.0–36.0)
MCV: 88.6 fL (ref 80.0–100.0)
Monocytes Absolute: 0.4 10*3/uL (ref 0.1–1.0)
Monocytes Relative: 7 %
Neutro Abs: 3.8 10*3/uL (ref 1.7–7.7)
Neutrophils Relative %: 72 %
Platelets: 258 10*3/uL (ref 150–400)
RBC: 2.98 MIL/uL — ABNORMAL LOW (ref 3.87–5.11)
RDW: 16.1 % — ABNORMAL HIGH (ref 11.5–15.5)
WBC: 5.3 10*3/uL (ref 4.0–10.5)
nRBC: 0 % (ref 0.0–0.2)

## 2021-03-05 LAB — LACTATE DEHYDROGENASE: LDH: 160 U/L (ref 98–192)

## 2021-03-05 LAB — FERRITIN: Ferritin: 338 ng/mL — ABNORMAL HIGH (ref 11–307)

## 2021-03-05 NOTE — Progress Notes (Signed)
Pt having some calf pain since last visit.

## 2021-03-05 NOTE — Progress Notes (Signed)
Mackenzie [name] NOTE  Patient Care Team: [name], MD as PCP - General (Cardiology) Alisa Graff, FNP as Nurse Practitioner (Family Medicine) [name], MD as Consulting Physician (Cardiology)  CHIEF COMPLAINTS/PURPOSE OF CONSULTATION: ANEMIA   HEMATOLOGY HISTORY:  # ANEMIA [NOV 2022 ; syncope hemoglobin 7 [facility]-FOBT positive EGD/ colonoscopy-no source of bleeding noted; [[place]]; capsule-pending on DEC 16th s/p previous epilepsy transfusion  # atrial fibrillation on Eliquis; systolic CHF [Dr.Khan] diabetes  HISTORY OF PRESENTING ILLNESS: Patient in wheelchair.  Accompanied by daughter. Mackenzie [name] 71 y.o.  female with a history of anemia of unclear etiology; is here for follow-up.  Patient last visit had a VQ scan done -negative for PE.  Patient is currently on oral iron.  Needs to be compliant.   Complains of mild cramping with her left leg/calf.  Review of Systems  Constitutional:  Positive for malaise/fatigue. Negative for chills, diaphoresis, fever and weight loss.  HENT:  Negative for nosebleeds and sore throat.   Eyes:  Negative for double vision.  Respiratory:  Positive for shortness of breath. Negative for cough, hemoptysis, sputum production and wheezing.   Cardiovascular:  Negative for chest pain, palpitations, orthopnea and leg swelling.  Gastrointestinal:  Negative for abdominal pain, blood in stool, constipation, diarrhea, heartburn, melena, nausea and vomiting.  Genitourinary:  Negative for dysuria, frequency and urgency.  Musculoskeletal:  Positive for back pain and joint pain.  Skin: Negative.  Negative for itching and rash.  Neurological:  Negative for dizziness, tingling, focal weakness, weakness and headaches.  Endo/Heme/Allergies:  Does not bruise/bleed easily.  Psychiatric/Behavioral:  Negative for depression. The patient is not nervous/anxious and does not have insomnia.    MEDICAL HISTORY:  Past Medical History:   Diagnosis Date   Arthritis    Atrial fibrillation (HCC)    CHF (congestive heart failure) (HCC)    HLD (hyperlipidemia)    Hypertension     SURGICAL HISTORY: Past Surgical History:  Procedure Laterality Date   COLONOSCOPY WITH PROPOFOL N/A 01/19/2021   hemorrhoids on perianal exam, examined portion ofileum normal, entire examined colon, normal. 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 no specimens   GIVENS CAPSULE STUDY N/A 02/02/2021   Procedure: GIVENS CAPSULE STUDY;  Surgeon: Harvel Quale, MD;  Location: [facility];  Service: Gastroenterology;  Laterality: N/A;  7:30   LEFT HEART CATH AND CORONARY ANGIOGRAPHY Right 05/06/2016   Procedure: Left Heart Cath and Coronary Angiography;  Surgeon: [name], MD;  Location: [facility];  Service: Cardiovascular;  Laterality: Right;    SOCIAL HISTORY: Social History   Socioeconomic History   Marital status: Widowed    Spouse name: Not on file   Number of children: Not on file   Years of education: Not on file   Highest education level: Not on file  Occupational History   Not on file  Tobacco Use   Smoking status: Never   Smokeless tobacco: Never  Vaping Use   Vaping Use: Never used  Substance and Sexual Activity   Alcohol use: No   Drug use: No   Sexual activity: Not on file  Other Topics Concern   Not on file  Social History Narrative   Not on file   Social Determinants  of Health   Financial Resource Strain: Not on file  Food Insecurity: Not on file  Transportation Needs: Not on file  Physical Activity: Not on file  Stress: Not on file  Social Connections: Not on file  Intimate Partner Violence: Not on file    FAMILY HISTORY: Family  History  Problem Relation Age of Onset   Diabetes Mother    Heart disease Mother    Heart attack Father    Diabetes Brother    Breast cancer Maternal Grandmother    Breast cancer Maternal Aunt    Colon cancer Neg Hx    Colon polyps Neg Hx     ALLERGIES:  has No Known Allergies.  MEDICATIONS:  Current Outpatient Medications  Medication Sig Dispense Refill   albuterol (PROVENTIL HFA;VENTOLIN HFA) 108 (90 Base) MCG/ACT inhaler Inhale 2 puffs into the lungs daily as needed for wheezing or shortness of breath. 1 Inhaler 5   apixaban (ELIQUIS) 5 MG TABS tablet Take 1 tablet (5 mg total) by mouth 2 (two) times daily. 60 tablet 5   atorvastatin (LIPITOR) 40 MG tablet Take 1 tablet (40 mg total) by mouth daily at 6 PM. 30 tablet 5   blood glucose meter kit and supplies KIT Dispense based on patient and insurance preference. Use up to four times daily as directed. 1 each 0   carvedilol (COREG) 12.5 MG tablet Take 1 tablet (12.5 mg total) by mouth 2 (two) times daily with a meal. 60 tablet 5   ferrous sulfate 325 (65 FE) MG tablet Take 1 tablet (325 mg total) by mouth daily. 30 tablet 3   guaiFENesin-dextromethorphan (ROBITUSSIN DM) 100-10 MG/5ML syrup Take 5 mLs by mouth every 4 (four) hours as needed for cough. 118 mL 0   pantoprazole (PROTONIX) 40 MG tablet Take 1 tablet (40 mg total) by mouth daily. 30 tablet 1   sacubitril-valsartan (ENTRESTO) 24-26 MG Take 1 tablet by mouth 2 (two) times daily. 60 tablet 3   spironolactone (ALDACTONE) 25 MG tablet Take 1 tablet (25 mg total) by mouth daily. 30 tablet 5   aspirin EC 81 MG tablet Take 81 mg by mouth daily. (Patient not taking: Reported on 03/05/2021)     No current facility-administered medications for this visit.      PHYSICAL EXAMINATION:   Vitals:   03/05/21 1052  BP: (!) 156/73  Pulse: 64  Temp: (!) 97.4 F (36.3 C)  SpO2: 100%   Filed Weights   03/05/21 1052  Weight: 166 lb 9.6 oz (75.6 kg)    Physical Exam Vitals and  nursing note reviewed.  HENT:     Head: Normocephalic and atraumatic.     Mouth/Throat:     Pharynx: Oropharynx is clear.  Eyes:     Extraocular Movements: Extraocular movements intact.     Pupils: Pupils are equal, round, and reactive to light.  Cardiovascular:     Rate and Rhythm: Normal rate and regular rhythm.  Pulmonary:     Comments: Decreased breath sounds bilaterally.  Abdominal:     Palpations: Abdomen is soft.  Musculoskeletal:        General: Normal range of motion.     Cervical back: Normal range of motion.  Skin:    General: Skin is warm.  Neurological:     General: No focal deficit present.     Mental Status: She is alert and oriented to person, place, and time.  Psychiatric:        Behavior: Behavior normal.  Judgment: Judgment normal.    LABORATORY DATA:  I have reviewed the data as listed Lab Results  Component Value Date   WBC 5.3 03/05/2021   HGB 8.4 (L) 03/05/2021   HCT 26.4 (L) 03/05/2021   MCV 88.6 03/05/2021   PLT 258 03/05/2021   Recent Labs    01/23/21 1211 02/14/21 1322 03/05/21 0959  NA 132* 137 135  K 4.2 4.5 4.9  CL 97* 101 104  CO2 23 24 21*  GLUCOSE 100* 98 101*  BUN 22 40* 62*  CREATININE 1.18* 2.23* 4.94*  CALCIUM 8.2* 8.5* 8.3*  GFRNONAA 50* 23* 9*  PROT 7.8 7.6 7.8  ALBUMIN 2.8* 3.1* 3.4*  AST 17 15 14*  ALT $Re'10 12 9  'DNc$ ALKPHOS 49 58 63  BILITOT 0.7 0.6 0.6     DG Chest 2 View  Result Date: 02/15/2021 CLINICAL DATA:  Hemoptysis. EXAM: CHEST - 2 VIEW COMPARISON:  One view chest x-ray FINDINGS: Heart is enlarged. Atherosclerotic changes are present at the aortic arch. Lungs are clear. No edema or effusion is present. No focal airspace disease is present. Degenerative changes are present in the thoracic spine. No focal osseous abnormality is present. IMPRESSION: 1. Cardiomegaly without failure. 2. No acute cardiopulmonary disease. Electronically Signed   By: San Morelle M.D.   On: 02/15/2021 12:07   NM  Pulmonary Perfusion  Result Date: 02/15/2021 CLINICAL DATA:  Hemoptysis. EXAM: NUCLEAR MEDICINE PERFUSION LUNG SCAN TECHNIQUE: Perfusion images were obtained in multiple projections after intravenous injection of radiopharmaceutical. Ventilation scans intentionally deferred if perfusion scan and chest x-ray adequate for interpretation during COVID 19 epidemic. RADIOPHARMACEUTICALS:  3.95 mCi Tc-38m MAA IV COMPARISON:  Two-view chest x-ray 02/15/2021 FINDINGS: No significant perfusion defect is present.  Normal lung volumes. IMPRESSION: No pulmonary embolus. Electronically Signed   By: San Morelle M.D.   On: 02/15/2021 12:05    Symptomatic anemia # Anemia/symptomatic-question GI bleed [see below]-November 2022 nadir hemoglobin 7; s/p PRBC transfusion [in hospital].  Patient on p.o. iron-tolerating well.   #Today-hemoglobin 8.2 [however see below]; iron studies-saturation 30%; no evidence of iron deficiency.  Continue p.o. iron.  Follow-up Venofer.  #Acute on chronic kidney disease-GFR 9.-Creatinine 4.9 baseline around 1.  The etiology is unclear.  Recommend urgent evaluation with work-up including ultrasound; recommend myeloma panel kappa lambda light chain [sent to labs however-unfortunately unable to re-draw the blood]; patient on ARB/spirinolactone- see below.  Given transportation issues/daughter will take the patient to [facility].   # A.fib/CHF [Dr.Khan] - on Eliquis; taken off Asprin- in 2 weeks.  Defer to Dr. Humphrey Rolls re: further management/adjustment of diuretic/ARB/Eliquis etc.  Discussed with Dr.Kahn-kindly agrees to evaluate the patient 1/17.  Patient's daughter will call-to confirm the appointment.  #Follow-up to be decided-based on evaluation at [facility].   # DISPOSITION: # labs today-  # No venofer today # Follow up  TBD-Dr.B      All questions were answered. The patient knows to call the clinic with any problems, questions or concerns.      Cammie Sickle, MD 03/05/2021 1:01 PM

## 2021-03-05 NOTE — Addendum Note (Signed)
Addended by: Vanice Sarah on: 03/05/2021 04:22 PM   Modules accepted: Orders

## 2021-03-05 NOTE — Assessment & Plan Note (Addendum)
#   Anemia/symptomatic-question GI bleed [see below]-November 2022 nadir hemoglobin 7; s/p PRBC transfusion [in hospital].  Patient on p.o. iron-tolerating well.   #Today-hemoglobin 8.2 [however see below]; iron studies-saturation 30%; no evidence of iron deficiency.  Continue p.o. iron.  Follow-up Venofer.  #Acute on chronic kidney disease-GFR 9.-Creatinine 4.9 baseline around 1.  The etiology is unclear.  Recommend urgent evaluation with work-up including ultrasound; recommend myeloma panel kappa lambda light chain [sent to labs however-unfortunately unable to re-draw the blood]; patient on ARB/spirinolactone- see below.  Given transportation issues/daughter will take the patient to [facility]   # A.fib/CHF [Dr.Khan] - on Eliquis; taken off Asprin- in 2 weeks.  Defer to Dr. Humphrey Rolls re: further management/adjustment of diuretic/ARB/Eliquis etc.  Discussed with Dr.Kahn-kindly agrees to evaluate the patient 1/17.  Patient's daughter will call-to confirm the appointment.  #Follow-up to be decided-based on evaluation at [facility].   # DISPOSITION: # labs today-  # No venofer today # Follow up  TBD-Dr.B

## 2021-03-06 ENCOUNTER — Inpatient Hospital Stay (HOSPITAL_COMMUNITY)
Admission: EM | Admit: 2021-03-06 | Discharge: 2021-03-16 | DRG: 683 | Disposition: A | Discharge status: Home / Self Care | Payer: Medicare Other | Attending: Family Medicine | Admitting: Family Medicine

## 2021-03-06 ENCOUNTER — Encounter (HOSPITAL_COMMUNITY): Payer: Self-pay | Admitting: Emergency Medicine

## 2021-03-06 ENCOUNTER — Emergency Department (HOSPITAL_COMMUNITY): Payer: Medicare Other

## 2021-03-06 ENCOUNTER — Other Ambulatory Visit: Payer: Self-pay

## 2021-03-06 DIAGNOSIS — I10 Essential (primary) hypertension: Secondary | ICD-10-CM | POA: Diagnosis not present

## 2021-03-06 DIAGNOSIS — E871 Hypo-osmolality and hyponatremia: Secondary | ICD-10-CM | POA: Diagnosis not present

## 2021-03-06 DIAGNOSIS — Z20822 Contact with and (suspected) exposure to covid-19: Secondary | ICD-10-CM | POA: Diagnosis present

## 2021-03-06 DIAGNOSIS — E872 Acidosis, unspecified: Secondary | ICD-10-CM | POA: Diagnosis not present

## 2021-03-06 DIAGNOSIS — N051 Unspecified nephritic syndrome with focal and segmental glomerular lesions: Secondary | ICD-10-CM | POA: Diagnosis not present

## 2021-03-06 DIAGNOSIS — D62 Acute posthemorrhagic anemia: Secondary | ICD-10-CM | POA: Diagnosis present

## 2021-03-06 DIAGNOSIS — I5022 Chronic systolic (congestive) heart failure: Secondary | ICD-10-CM | POA: Diagnosis not present

## 2021-03-06 DIAGNOSIS — Z7901 Long term (current) use of anticoagulants: Secondary | ICD-10-CM | POA: Diagnosis not present

## 2021-03-06 DIAGNOSIS — R001 Bradycardia, unspecified: Secondary | ICD-10-CM | POA: Diagnosis not present

## 2021-03-06 DIAGNOSIS — N179 Acute kidney failure, unspecified: Principal | ICD-10-CM | POA: Diagnosis present

## 2021-03-06 DIAGNOSIS — Z7982 Long term (current) use of aspirin: Secondary | ICD-10-CM | POA: Diagnosis not present

## 2021-03-06 DIAGNOSIS — Z803 Family history of malignant neoplasm of breast: Secondary | ICD-10-CM | POA: Diagnosis not present

## 2021-03-06 DIAGNOSIS — D509 Iron deficiency anemia, unspecified: Secondary | ICD-10-CM | POA: Diagnosis present

## 2021-03-06 DIAGNOSIS — Z8249 Family history of ischemic heart disease and other diseases of the circulatory system: Secondary | ICD-10-CM | POA: Diagnosis not present

## 2021-03-06 DIAGNOSIS — M1990 Unspecified osteoarthritis, unspecified site: Secondary | ICD-10-CM | POA: Diagnosis present

## 2021-03-06 DIAGNOSIS — E785 Hyperlipidemia, unspecified: Secondary | ICD-10-CM | POA: Diagnosis present

## 2021-03-06 DIAGNOSIS — N1831 Chronic kidney disease, stage 3a: Secondary | ICD-10-CM | POA: Diagnosis present

## 2021-03-06 DIAGNOSIS — R55 Syncope and collapse: Secondary | ICD-10-CM | POA: Diagnosis present

## 2021-03-06 DIAGNOSIS — I480 Paroxysmal atrial fibrillation: Secondary | ICD-10-CM | POA: Diagnosis present

## 2021-03-06 DIAGNOSIS — Z833 Family history of diabetes mellitus: Secondary | ICD-10-CM

## 2021-03-06 DIAGNOSIS — I130 Hypertensive heart and chronic kidney disease with heart failure and stage 1 through stage 4 chronic kidney disease, or unspecified chronic kidney disease: Secondary | ICD-10-CM | POA: Diagnosis present

## 2021-03-06 DIAGNOSIS — C9000 Multiple myeloma not having achieved remission: Secondary | ICD-10-CM | POA: Diagnosis present

## 2021-03-06 DIAGNOSIS — K219 Gastro-esophageal reflux disease without esophagitis: Secondary | ICD-10-CM | POA: Diagnosis present

## 2021-03-06 DIAGNOSIS — Z79899 Other long term (current) drug therapy: Secondary | ICD-10-CM | POA: Diagnosis not present

## 2021-03-06 LAB — URINALYSIS, MICROSCOPIC (REFLEX): RBC / HPF: >50 RBC/hpf (ref 0–5)

## 2021-03-06 LAB — URINALYSIS, ROUTINE W REFLEX MICROSCOPIC
Bilirubin Urine: NEGATIVE
Glucose, UA: NEGATIVE mg/dL
Ketones, ur: NEGATIVE mg/dL
Nitrite: NEGATIVE
Protein, ur: NEGATIVE mg/dL
Specific Gravity, Urine: 1.015 (ref 1.005–1.030)
pH: 5 (ref 5.0–8.0)

## 2021-03-06 LAB — PHOSPHORUS: Phosphorus: 5.1 mg/dL — ABNORMAL HIGH (ref 2.5–4.6)

## 2021-03-06 LAB — RESP PANEL BY RT-PCR (FLU A&B, COVID) ARPGX2
Influenza A by PCR: NEGATIVE
Influenza B by PCR: NEGATIVE
SARS Coronavirus 2 by RT PCR: NEGATIVE

## 2021-03-06 LAB — MAGNESIUM: Magnesium: 1.4 mg/dL — ABNORMAL LOW (ref 1.7–2.4)

## 2021-03-06 LAB — CK: Total CK: 38 U/L (ref 38–234)

## 2021-03-06 MED ORDER — PANTOPRAZOLE SODIUM 40 MG PO TBEC
40.0000 mg | DELAYED_RELEASE_TABLET | Freq: Every day | ORAL | Status: DC
  Administered 2021-03-07 – 2021-03-16 (×10): 40 mg via ORAL
  Filled 2021-03-06 (×11): qty 1

## 2021-03-06 MED ORDER — ACETAMINOPHEN 325 MG PO TABS: 650.0000 mg | ORAL_TABLET | Freq: Four times a day (QID) | ORAL | Status: DC | PRN

## 2021-03-06 MED ORDER — APIXABAN 5 MG PO TABS
5.0000 mg | ORAL_TABLET | Freq: Two times a day (BID) | ORAL | Status: DC
  Administered 2021-03-06 – 2021-03-09 (×7): 5 mg via ORAL
  Filled 2021-03-06 (×8): qty 1

## 2021-03-06 MED ORDER — CARVEDILOL 12.5 MG PO TABS
12.5000 mg | ORAL_TABLET | Freq: Two times a day (BID) | ORAL | Status: DC
  Administered 2021-03-06 – 2021-03-13 (×14): 12.5 mg via ORAL
  Filled 2021-03-06 (×16): qty 1

## 2021-03-06 MED ORDER — SODIUM CHLORIDE 0.9 % IV BOLUS
1000.0000 mL | Freq: Once | INTRAVENOUS | Status: AC
Start: 2021-03-06 — End: 2021-03-06
  Administered 2021-03-06: 1000 mL via INTRAVENOUS

## 2021-03-06 MED ORDER — ONDANSETRON HCL 4 MG/2ML IJ SOLN: 4.0000 mg | Freq: Four times a day (QID) | INTRAMUSCULAR | Status: DC | PRN

## 2021-03-06 MED ORDER — ONDANSETRON HCL 4 MG PO TABS: 4.0000 mg | ORAL_TABLET | Freq: Four times a day (QID) | ORAL | Status: DC | PRN

## 2021-03-06 MED ORDER — SODIUM CHLORIDE 0.9 % IV SOLN: INTRAVENOUS | Status: AC

## 2021-03-06 MED ORDER — ATORVASTATIN CALCIUM 40 MG PO TABS
40.0000 mg | ORAL_TABLET | Freq: Every day | ORAL | Status: DC
  Administered 2021-03-06 – 2021-03-15 (×10): 40 mg via ORAL
  Filled 2021-03-06 (×10): qty 1

## 2021-03-06 MED ORDER — ACETAMINOPHEN 650 MG RE SUPP: 650.0000 mg | Freq: Four times a day (QID) | RECTAL | Status: DC | PRN

## 2021-03-06 MED ORDER — FERROUS SULFATE 325 (65 FE) MG PO TABS
325.0000 mg | ORAL_TABLET | Freq: Every day | ORAL | Status: DC
Start: 2021-03-06 — End: 2021-03-16
  Administered 2021-03-07 – 2021-03-16 (×10): 325 mg via ORAL
  Filled 2021-03-06 (×11): qty 1

## 2021-03-06 NOTE — ED Triage Notes (Addendum)
Pt daughter reports patient told to come to ED for abnormal blood work drawn yesterday.

## 2021-03-06 NOTE — ED Notes (Signed)
Pt put in gown and on cardiac monitor

## 2021-03-06 NOTE — ED Provider Notes (Signed)
[facility] EMERGENCY DEPARTMENT Provider Note   CSN: [id number] Arrival date & time: 03/06/21  3614     History  Chief Complaint  Patient presents with   Abnormal Lab    Mackenzie [name] is a 71 y.o. female.   Abnormal Lab  This patient is a 70 year old female, she has atrial fibrillation on Eliquis, she takes atorvastatin for cholesterol and for her blood pressure she takes carvedilol, Aldactone, and reports to me that she is also on an angiotensin receptor blocker per the medical record.  Valsartan.  She states that she was sent here because of abnormal lab values and in fact on review of the medical record it appears that she has had an elevated creatinine, when it was checked a month ago her creatinine was 1.1, on December 28 it was 2.2, yesterday it was 4.9 and she was sent here today for evaluation and management.  Specific request included a renal ultrasound as well as having evaluation for multiple myeloma.  Incidentally the patient had been admitted to the hospital couple of months ago because of anemia and syncope, no definite cause of the anemia was found  Home Medications Prior to Admission medications   Medication Sig Start Date End Date Taking? Authorizing Provider  albuterol (PROVENTIL HFA;VENTOLIN HFA) 108 (90 Base) MCG/ACT inhaler Inhale 2 puffs into the lungs daily as needed for wheezing or shortness of breath. 06/11/16   Alisa Graff, FNP  apixaban (ELIQUIS) 5 MG TABS tablet Take 1 tablet (5 mg total) by mouth 2 (two) times daily. 06/11/16   Alisa Graff, FNP  aspirin EC 81 MG tablet Take 81 mg by mouth daily. Patient not taking: Reported on 03/05/2021    [provider]  atorvastatin (LIPITOR) 40 MG tablet Take 1 tablet (40 mg total) by mouth daily at 6 PM. 06/11/16   Alisa Graff, FNP  blood glucose meter kit and supplies KIT Dispense based on patient and insurance preference. Use up to four times daily as directed. 01/20/21   Manuella Ghazi, Pratik D, DO   carvedilol (COREG) 12.5 MG tablet Take 1 tablet (12.5 mg total) by mouth 2 (two) times daily with a meal. 06/11/16   Alisa Graff, FNP  ferrous sulfate 325 (65 FE) MG tablet Take 1 tablet (325 mg total) by mouth daily. 01/20/21 01/20/22  Manuella Ghazi, Pratik D, DO  guaiFENesin-dextromethorphan (ROBITUSSIN DM) 100-10 MG/5ML syrup Take 5 mLs by mouth every 4 (four) hours as needed for cough. 01/20/21   Manuella Ghazi, Pratik D, DO  pantoprazole (PROTONIX) 40 MG tablet Take 1 tablet (40 mg total) by mouth daily. 01/20/21 01/20/22  Manuella Ghazi, Pratik D, DO  sacubitril-valsartan (ENTRESTO) 24-26 MG Take 1 tablet by mouth 2 (two) times daily. 05/13/16   Alisa Graff, FNP  spironolactone (ALDACTONE) 25 MG tablet Take 1 tablet (25 mg total) by mouth daily. 01/20/21   Heath Lark D, DO      Allergies    Patient has no known allergies.    Review of Systems   Review of Systems  All other systems reviewed and are negative.  Physical Exam Updated Vital Signs BP (!) 143/83    Pulse 69    Temp 98 F (36.7 C) (Oral)    Resp 17    Ht 1.676 m ($Remove'5\' 6"'KyIXAQV$ )    Wt 75.5 kg    SpO2 100%    BMI 26.87 kg/m  Physical Exam Vitals and nursing note reviewed.  Constitutional:      General:  She is not in acute distress.    Appearance: She is well-developed.  HENT:     Head: Normocephalic and atraumatic.     Mouth/Throat:     Pharynx: No oropharyngeal exudate.  Eyes:     General: No scleral icterus.       Right eye: No discharge.        Left eye: No discharge.     Conjunctiva/sclera: Conjunctivae normal.     Pupils: Pupils are equal, round, and reactive to light.  Neck:     Thyroid: No thyromegaly.     Vascular: No JVD.  Cardiovascular:     Rate and Rhythm: Normal rate and regular rhythm.     Heart sounds: Normal heart sounds. No murmur heard.   No friction rub. No gallop.  Pulmonary:     Effort: Pulmonary effort is normal. No respiratory distress.     Breath sounds: Normal breath sounds. No wheezing or rales.  Abdominal:      General: Bowel sounds are normal. There is no distension.     Palpations: Abdomen is soft. There is no mass.     Tenderness: There is no abdominal tenderness.  Musculoskeletal:        General: No tenderness. Normal range of motion.     Cervical back: Normal range of motion and neck supple.     Right lower leg: No edema.     Left lower leg: No edema.  Lymphadenopathy:     Cervical: No cervical adenopathy.  Skin:    General: Skin is warm and dry.     Findings: No erythema or rash.  Neurological:     Mental Status: She is alert.     Coordination: Coordination normal.  Psychiatric:        Behavior: Behavior normal.    ED Results / Procedures / Treatments   Labs (all labs ordered are listed, but only abnormal results are displayed) Labs Reviewed  URINE CULTURE  URINALYSIS, ROUTINE W REFLEX MICROSCOPIC  KAPPA/LAMBDA LIGHT CHAINS  MULTIPLE MYELOMA PANEL, SERUM  MAGNESIUM  PHOSPHORUS    EKG None  Radiology US Renal  Result Date: 03/06/2021 CLINICAL DATA:  Renal failure EXAM: RENAL / URINARY TRACT ULTRASOUND COMPLETE COMPARISON:  None. FINDINGS: Right Kidney: Renal measurements: 11.3 x 5.6 x 5.5 cm = volume: 180.6 mL. Echogenicity increased. No mass or hydronephrosis visualized. Left Kidney: Suboptimally visualized. Renal measurements: 11.1 x 6 x 5.5 cm = volume: 191.1 mL. Echogenicity increased. No mass or hydronephrosis visualized. Bladder: Appears normal for degree of bladder distention. Other: None. IMPRESSION: No hydronephrosis. Increased renal echogenicity suggesting medical renal disease. Electronically Signed   By: Macy Mis M.D.   On: 03/06/2021 12:32    Procedures Procedures    Medications Ordered in ED Medications  sodium chloride 0.9 % bolus 1,000 mL (has no administration in time range)    ED Course/ Medical Decision Making/ A&P                           Medical Decision Making The patient has a soft nontender abdomen, mucous membranes appear moist, vital  signs are unremarkable however she has had progressive renal dysfunction over the last couple of months with an associated anemia, no definite cause is no but she will need to be admitted, her BUN has crept up and is currently at 61, this is somewhat suggestive of a prerenal cause.  IV fluids given, will consult with hospitalist for admission.  Renal ultrasound ordered.  Problems Addressed: Acute renal failure, unspecified acute renal failure type [facility]):    Details: The patient's renal dysfunction will be treated with IV fluids and admission to the hospital.  Ultrasound did not reveal any specific obstructive findings.  There does appear to be hematuria and many bacteria present.  Culture has been sent  Amount and/or Complexity of Data Reviewed Independent Historian: caregiver    Details: Daughter reports most of the history regarding dehydration and prior visits including to [facility] as well as the oncology service External Data Reviewed: labs.    Details: Prior data reviewed and interpreted by myself, showed that there was a progressive renal dysfunction over the last month Labs: ordered.    Details: , I personally viewed and interpreted the laboratory data as follows, urinary bacteriuria present, metabolic panel shows progressive renal dysfunction Radiology: ordered and independent interpretation performed.    Details: Ultrasound shows no signs of obstructive disease Discussion of management or test interpretation with external provider(s):  Will be admitted by the hospitalist, Dr. Alda Lea, appreciate his willingness to take care of this patient  Risk Decision regarding hospitalization.           Final Clinical Impression(s) / ED Diagnoses Final diagnoses:  Acute renal failure, unspecified acute renal failure type (HCC)      Noemi Chapel, MD 03/06/21 1259

## 2021-03-06 NOTE — H&P (Signed)
History and Physical  TEMPIE GIBEAULT FBP:[id number] DOB: 12-02-1950 DOA: 03/06/2021   PCP: Dionisio Tarun Patchell, MD   Patient coming from: Home  Chief Complaint: abnormal labs  HPI:  Mackenzie [name] is a 71 y.o. female with medical history of systolic and diastolic CHF, hyperlipidemia, iron deficiency anemia, atrial fibrillation, GERD presenting with abnormal labs.  Followed up with her usual hematologist on 03/05/2021.  Routine blood work was obtained and showed serum creatinine 4.94, potassium 4.9, sodium 135, bicarbonate 21.  On the same day, CBC showed WBC 5.3, hemoglobin 8.4, platelets 258,000.  Because of her elevated serum creatinine, the patient was told to come to the hospital for further evaluation and treatment. The patient herself has been in her usual state of health.  She is without any new complaints except for one episode of nausea and vomiting on 01/03/2022 after eating some sausage.  She has not had any emesis since that time and has been able to tolerate a diet.  She denies any new medications or recent changes in the dosing of her medications.  She denies any NSAIDs.  She does not take any over-the-counter medicines except for acetaminophen.  The patient feels that she has been eating well.  She has not had any diarrhea, abdominal pain, chest pain, shortness of breath, worsening lower extremity edema. Notably, the patient was recently admitted to the hospital from 01/15/2021 to 01/20/2021 for acute blood loss anemia.  Her hemoglobin was 7.0 at that time.  She underwent EGD and colonoscopy both of which did not reveal source of bleeding.  She subsequently underwent capsule endoscopy on 02/02/2021 which showed mild duodenitis without any hematin.  During this hospitalization, the patient was treated for influenza A.  She was subsequently sent to see hematology who felt the patient had iron deficiency anemia.  She has not had any recent contrasted radiographic studies. Patient denies any  headache, neck pain, chest pain, short of breath, coughing, hemoptysis, abdominal pain, dysuria, hematuria, flank pain. ED Patient was afebrile hemodynamically stable with oxygen saturation 100% room air.  Lab work as discussed above.  Magnesium 1.4, phosphorus 5.1.  She was given 1 L normal saline in the emergency department.  Assessment/Plan: Acute on chronic renal failure--CKD 3a -baseline creatinine 1.0-1.3 -Etiology unclear, but suspect a degree of volume depletion -03/06/2021 renal ultrasound--no hydronephrosis, increased renal echogenicity -Check SPEP and immunofixation -Nephrology consult -Gentle/judicious hydration over the next 24 hours -UA 6-10 WBC, > 50 RBC -holding entresto and spironolactone -check CK  Chronic systolic and diastolic CHF -She appears clinically euvolemic -12/27/2020 echo EF 60 to 65%, no WMA, grade 1 DD, PASP 41.2 -Holding Entresto -Holding spironolactone -Continue carvedilol  Atrial fibrillation, type unspecified -Rate controlled -Continue carvedilol -Continue apixaban  Hyperlipidemia -Continue statin  Essential hypertension -Carvedilol  Iron deficiency anemia -Continue ferrous sulfate       Past Medical History:  Diagnosis Date   Arthritis    Atrial fibrillation (HCC)    CHF (congestive heart failure) (HCC)    HLD (hyperlipidemia)    Hypertension    Past Surgical History:  Procedure Laterality Date   COLONOSCOPY WITH PROPOFOL N/A 01/19/2021   hemorrhoids on perianal exam, examined portion ofileum normal, entire examined colon, normal. 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 no  specimens   GIVENS CAPSULE STUDY N/A 02/02/2021   Procedure: GIVENS CAPSULE STUDY;  Surgeon: Harvel Quale, MD;  [facility];  Service: Gastroenterology;  Laterality: N/A;  7:30   LEFT HEART CATH AND CORONARY ANGIOGRAPHY Right 05/06/2016   Procedure: Left Heart Cath and Coronary Angiography;  Surgeon: Dionisio Koleton Duchemin, MD;  Location: [place] [facility];  Service: Cardiovascular;  Laterality: Right;   Social History:  reports that she has never smoked. She has never used smokeless tobacco. She reports that she does not drink alcohol and does not use drugs.   Family History  Problem Relation Age of Onset   Diabetes Mother    Heart disease Mother    Heart attack Father    Diabetes Brother    Breast cancer Maternal Grandmother    Breast cancer Maternal Aunt    Colon cancer Neg Hx    Colon polyps Neg Hx      No Known Allergies   Prior to Admission medications   Medication Sig Start Date End Date Taking? Authorizing Provider  albuterol (PROVENTIL HFA;VENTOLIN HFA) 108 (90 Base) MCG/ACT inhaler Inhale 2 puffs into the lungs daily as needed for wheezing or shortness of breath. 06/11/16  Yes Alisa Graff, FNP  apixaban (ELIQUIS) 5 MG TABS tablet Take 1 tablet (5 mg total) by mouth 2 (two) times daily. 06/11/16  Yes Darylene Price A, FNP  atorvastatin (LIPITOR) 40 MG tablet Take 1 tablet (40 mg total) by mouth daily at 6 PM. 06/11/16  Yes Alisa Graff, FNP  blood glucose meter kit and supplies KIT Dispense based on patient and insurance preference. Use up to four times daily as directed. 01/20/21  Yes Manuella Ghazi, Pratik D, DO  carvedilol (COREG) 12.5 MG tablet Take 1 tablet (12.5 mg total) by mouth 2 (two) times daily with a meal. 06/11/16  Yes Hackney, Tina A, FNP  ferrous sulfate 325 (65 FE) MG tablet Take 1 tablet (325 mg total) by mouth daily. 01/20/21 01/20/22 Yes Shah, Pratik D, DO  guaiFENesin-dextromethorphan (ROBITUSSIN DM) 100-10 MG/5ML syrup Take 5 mLs by mouth every 4 (four) hours as needed for cough. 01/20/21  Yes Shah, Pratik D, DO  pantoprazole (PROTONIX) 40 MG  tablet Take 1 tablet (40 mg total) by mouth daily. 01/20/21 01/20/22 Yes Shah, Pratik D, DO  sacubitril-valsartan (ENTRESTO) 24-26 MG Take 1 tablet by mouth 2 (two) times daily. 05/13/16  Yes Darylene Price A, FNP  spironolactone (ALDACTONE) 25 MG tablet Take 1 tablet (25 mg total) by mouth daily. 01/20/21  Yes Shah, Pratik D, DO    Review of Systems:  Constitutional:  No weight loss, night sweats, Fevers, chills, fatigue.  Head&Eyes: No headache.  No vision loss.  No eye pain or scotoma ENT:  No Difficulty swallowing,Tooth/dental problems,Sore throat,  No ear ache, post nasal drip,  Cardio-vascular:  No chest pain, Orthopnea, PND, swelling in lower extremities,  dizziness, palpitations  GI:  No  abdominal pain, nausea, vomiting, diarrhea, loss of appetite, hematochezia, melena, heartburn, indigestion, Resp:  No shortness of breath with exertion or at rest. No cough. No coughing up of blood .No wheezing.No chest wall deformity  Skin:  no rash or lesions.  GU:  no dysuria, change in color of urine, no urgency or frequency. No flank pain.  Musculoskeletal:  No joint pain or swelling. No decreased range of motion. No back pain.  Psych:  No change in mood or affect. No depression or anxiety. Neurologic: No headache, no  dysesthesia, no focal weakness, no vision loss. No syncope  Physical Exam: Vitals:   03/06/21 1015 03/06/21 1022 03/06/21 1100 03/06/21 1115  BP: (!) 143/83  140/61   Pulse: 69  62 64  Resp:  _0 Temp:  98 F (36.7 C)    TempSrc:  Oral    SpO2: 100%  99% 100%  Weight:      Height:       General:  A&O x 3, NAD, nontoxic, pleasant/cooperative Head/Eye: No conjunctival hemorrhage, no icterus, [place]/AT, No nystagmus ENT:  No icterus,  No thrush, good dentition, no pharyngeal exudate Neck:  No masses, no lymphadenpathy, no bruits CV:  RRR, no rub, no gallop, no S3 Lung:  CTAB, good air movement, no wheeze, no rhonchi Abdomen: soft/NT, +BS, nondistended, no  peritoneal signs Ext: No cyanosis, No rashes, No petechiae, No lymphangitis, No edema Neuro: CNII-XII intact, strength 4/5 in bilateral upper and lower extremities, no dysmetria  Labs on Admission:  Basic Metabolic Panel: Recent Labs  Lab 03/05/21 0959 03/06/21 1044  NA 135  --   K 4.9  --   CL 104  --   CO2 21*  --   GLUCOSE 101*  --   BUN 62*  --   CREATININE 4.94*  --   CALCIUM 8.3*  --   MG  --  1.4*  PHOS  --  5.1*   Liver Function Tests: Recent Labs  Lab 03/05/21 0959  AST 14*  ALT 9  ALKPHOS 63  BILITOT 0.6  PROT 7.8  ALBUMIN 3.4*   No results for input(s): LIPASE, AMYLASE in the last 168 hours. No results for input(s): AMMONIA in the last 168 hours. CBC: Recent Labs  Lab 03/05/21 0959  WBC 5.3  NEUTROABS 3.8  HGB 8.4*  HCT 26.4*  MCV 88.6  PLT 258   Coagulation Profile: No results for input(s): INR, PROTIME in the last 168 hours. Cardiac Enzymes: No results for input(s): CKTOTAL, CKMB, CKMBINDEX, TROPONINI in the last 168 hours. BNP: Invalid input(s): POCBNP CBG: No results for input(s): GLUCAP in the last 168 hours. Urine analysis:    Component Value Date/Time   COLORURINE YELLOW 03/06/2021 1205   APPEARANCEUR HAZY (A) 03/06/2021 1205   LABSPEC 1.015 03/06/2021 1205   PHURINE 5.0 03/06/2021 1205   GLUCOSEU NEGATIVE 03/06/2021 1205   HGBUR LARGE (A) 03/06/2021 1205   BILIRUBINUR NEGATIVE 03/06/2021 1205   [place] 03/06/2021 1205   PROTEINUR NEGATIVE 03/06/2021 1205   NITRITE NEGATIVE 03/06/2021 1205   LEUKOCYTESUR SMALL (A) 03/06/2021 1205   Sepsis Labs: _1 (procalcitonin:4,lacticidven:4) )No results found for this or any previous visit (from the past 240 hour(s)).   Radiological Exams on Admission: US Renal  Result Date: 03/06/2021 CLINICAL DATA:  Renal failure EXAM: RENAL / URINARY TRACT ULTRASOUND COMPLETE COMPARISON:  None. FINDINGS: Right Kidney: Renal measurements: 11.3 x 5.6 x 5.5 cm = volume: 180.6 mL.  Echogenicity increased. No mass or hydronephrosis visualized. Left Kidney: Suboptimally visualized. Renal measurements: 11.1 x 6 x 5.5 cm = volume: 191.1 mL. Echogenicity increased. No mass or hydronephrosis visualized. Bladder: Appears normal for degree of bladder distention. Other: None. IMPRESSION: No hydronephrosis. Increased renal echogenicity suggesting medical renal disease. Electronically Signed   By: Macy Mis M.D.   On: 03/06/2021 12:32      Time spent:60 minutes Code Status:   FULL Family Communication:  No Family at bedside Disposition Plan: expect 2-3 day hospitalization Consults called: renal DVT Prophylaxis: apixaban  Shanon Brow  Akyla Vavrek, DO  [facility] Pager [phone]  If 7PM-7AM, please contact night-coverage [email or web address] Password [facility] 03/06/2021, 12:55 PM

## 2021-03-06 NOTE — ED Notes (Signed)
Pt alert and oriented and with no complaints at this time. She states that she was sent here by her physician d/t elevation in creatinine. Anemic at baseline. Hx of blood transfusion per record. No symptoms of anemia at this time. Generalized non-pitting edema noted to arms and legs bilaterally. Endorses taking "fluid pill" daily.

## 2021-03-07 LAB — CBC
HCT: 22.6 % — ABNORMAL LOW (ref 36.0–46.0)
Hemoglobin: 7 g/dL — ABNORMAL LOW (ref 12.0–15.0)
MCH: 29 pg (ref 26.0–34.0)
MCHC: 31 g/dL (ref 30.0–36.0)
MCV: 93.8 fL (ref 80.0–100.0)
Platelets: 226 10*3/uL (ref 150–400)
RBC: 2.41 MIL/uL — ABNORMAL LOW (ref 3.87–5.11)
RDW: 16.2 % — ABNORMAL HIGH (ref 11.5–15.5)
WBC: 4.8 10*3/uL (ref 4.0–10.5)
nRBC: 0 % (ref 0.0–0.2)

## 2021-03-07 LAB — BASIC METABOLIC PANEL
Anion gap: 12 (ref 5–15)
BUN: 59 mg/dL — ABNORMAL HIGH (ref 8–23)
CO2: 19 mmol/L — ABNORMAL LOW (ref 22–32)
Calcium: 8.1 mg/dL — ABNORMAL LOW (ref 8.9–10.3)
Chloride: 109 mmol/L (ref 98–111)
Creatinine, Ser: 4.85 mg/dL — ABNORMAL HIGH (ref 0.44–1.00)
GFR, Estimated: 9 mL/min — ABNORMAL LOW (ref >60)
Glucose, Bld: 85 mg/dL (ref 70–99)
Potassium: 5.1 mmol/L (ref 3.5–5.1)
Sodium: 140 mmol/L (ref 135–145)

## 2021-03-07 LAB — HEMOGLOBIN AND HEMATOCRIT, BLOOD
HCT: 24 % — ABNORMAL LOW (ref 36.0–46.0)
Hemoglobin: 7.3 g/dL — ABNORMAL LOW (ref 12.0–15.0)

## 2021-03-07 LAB — KAPPA/LAMBDA LIGHT CHAINS
Kappa free light chain: 184.3 mg/L — ABNORMAL HIGH (ref 3.3–19.4)
Kappa, lambda light chain ratio: 3.22 — ABNORMAL HIGH (ref 0.26–1.65)
Lambda free light chains: 57.3 mg/L — ABNORMAL HIGH (ref 5.7–26.3)

## 2021-03-07 LAB — POTASSIUM: Potassium: 4.8 mmol/L (ref 3.5–5.1)

## 2021-03-07 NOTE — Consult Note (Signed)
Reason for Consult:AKI Referring Physician: Dwyane Dee, MD  Mackenzie [name] is an 71 y.o. female with a PMH significant for chronic systolic and diastolic CHF, atrial fibrillation, HLD, iron deficiency anemia, GERD, HTN, who was instructed by her Hematologist to go to the hospital due to abnormal labs.  On 03/05/21 she had labs drawn that were notable for BUN 62, Cr 4.94, Hgb 8.4.  In the ED, she was completely asymptomatic.  Afebrile and vital signs were stable.  She was admitted and started on IVF's.  We were consulted to further evaluate her AKI, however it appears to be more subacute as her Scr started to climb at the end of November as seen below.  She was admitted with symptomatic anemia at that time and underwent EGD and colonoscopy.  Since discharge her Scr continued to climb.  She denies any N/V/D, dysuria, pyuria, hematuria, urgency, frequency, or retention.  No lower extremity edema, NSAIDs, recent IV contrast, or new medications or changes to her diuretics recently. She had been on Entresto and spironalactone, but those were held upon admission.   Trend in Creatinine: Creatinine, Ser  Date/Time Value Ref Range Status  03/07/2021 07:07 AM 4.85 (H) 0.44 - 1.00 mg/dL Final  03/05/2021 09:59 AM 4.94 (H) 0.44 - 1.00 mg/dL Final  02/14/2021 01:22 PM 2.23 (H) 0.44 - 1.00 mg/dL Final  01/23/2021 12:11 PM 1.18 (H) 0.44 - 1.00 mg/dL Final  01/20/2021 03:59 AM 1.29 (H) 0.44 - 1.00 mg/dL Final  01/19/2021 05:12 AM 1.29 (H) 0.44 - 1.00 mg/dL Final  01/17/2021 04:49 AM 1.02 (H) 0.44 - 1.00 mg/dL Final  01/16/2021 09:46 AM 1.13 (H) 0.44 - 1.00 mg/dL Final  01/15/2021 02:58 PM 1.05 (H) 0.44 - 1.00 mg/dL Final  01/02/2021 10:09 PM 0.77 0.44 - 1.00 mg/dL Final  01/02/2021 12:42 PM 0.99 0.44 - 1.00 mg/dL Final  01/10/2018 06:00 PM 0.71 0.44 - 1.00 mg/dL Final  08/29/2016 11:46 AM 0.86 0.44 - 1.00 mg/dL Final  06/11/2016 11:10 AM 0.99 0.44 - 1.00 mg/dL Final  05/07/2016 05:45 AM 0.73 0.44 - 1.00 mg/dL Final   05/06/2016 10:17 AM 0.76 0.44 - 1.00 mg/dL Final  05/06/2016 04:32 AM 0.88 0.44 - 1.00 mg/dL Final  05/05/2016 12:50 AM 0.82 0.44 - 1.00 mg/dL Final  05/04/2016 04:46 AM 0.86 0.44 - 1.00 mg/dL Final  05/02/2016 10:19 AM 0.76 0.44 - 1.00 mg/dL Final  05/02/2016 04:32 AM 0.64 0.44 - 1.00 mg/dL Final  05/01/2016 06:16 AM 0.94 0.44 - 1.00 mg/dL Final    PMH:   Past Medical History:  Diagnosis Date   Arthritis    Atrial fibrillation (HCC)    CHF (congestive heart failure) ([place])    HLD (hyperlipidemia)    Hypertension     PSH:   Past Surgical History:  Procedure Laterality Date   COLONOSCOPY WITH PROPOFOL N/A 01/19/2021   hemorrhoids on perianal exam, examined portion ofileum normal, entire examined colon, normal. 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 no specimens   GIVENS CAPSULE STUDY N/A 02/02/2021   Procedure: GIVENS CAPSULE STUDY;  Surgeon: Harvel Quale, MD;  [facility];  Service: Gastroenterology;  Laterality: N/A;  7:30   LEFT HEART CATH AND CORONARY ANGIOGRAPHY Right 05/06/2016   Procedure: Left Heart Cath and Coronary Angiography;  Surgeon: Earlyne Iba A  Humphrey Rolls, MD;  Location: [place] [facility];  Service: Cardiovascular;  Laterality: Right;    Allergies: No Known Allergies  Medications:   Prior to Admission medications   Medication Sig Start Date End Date Taking? Authorizing Provider  albuterol (PROVENTIL HFA;VENTOLIN HFA) 108 (90 Base) MCG/ACT inhaler Inhale 2 puffs into the lungs daily as needed for wheezing or shortness of breath. 06/11/16  Yes Alisa Graff, FNP  apixaban (ELIQUIS) 5 MG TABS tablet Take 1 tablet (5 mg total) by mouth 2 (two) times daily. 06/11/16  Yes Darylene Price A, FNP   atorvastatin (LIPITOR) 40 MG tablet Take 1 tablet (40 mg total) by mouth daily at 6 PM. 06/11/16  Yes Alisa Graff, FNP  blood glucose meter kit and supplies KIT Dispense based on patient and insurance preference. Use up to four times daily as directed. 01/20/21  Yes Manuella Ghazi, Pratik D, DO  carvedilol (COREG) 12.5 MG tablet Take 1 tablet (12.5 mg total) by mouth 2 (two) times daily with a meal. 06/11/16  Yes Hackney, Tina A, FNP  ferrous sulfate 325 (65 FE) MG tablet Take 1 tablet (325 mg total) by mouth daily. 01/20/21 01/20/22 Yes Shah, Pratik D, DO  guaiFENesin-dextromethorphan (ROBITUSSIN DM) 100-10 MG/5ML syrup Take 5 mLs by mouth every 4 (four) hours as needed for cough. 01/20/21  Yes Shah, Pratik D, DO  pantoprazole (PROTONIX) 40 MG tablet Take 1 tablet (40 mg total) by mouth daily. 01/20/21 01/20/22 Yes Shah, Pratik D, DO  sacubitril-valsartan (ENTRESTO) 24-26 MG Take 1 tablet by mouth 2 (two) times daily. 05/13/16  Yes Darylene Price A, FNP  spironolactone (ALDACTONE) 25 MG tablet Take 1 tablet (25 mg total) by mouth daily. 01/20/21  Yes Manuella Ghazi, Pratik D, DO    Inpatient medications:  apixaban  5 mg Oral BID   atorvastatin  40 mg Oral q1800   carvedilol  12.5 mg Oral BID WC   ferrous sulfate  325 mg Oral Daily   pantoprazole  40 mg Oral Daily    Discontinued Meds:   Medications Discontinued During This Encounter  Medication Reason   aspirin EC 81 MG tablet Patient Preference    Social History:  reports that she has never smoked. She has never used smokeless tobacco. She reports that she does not drink alcohol and does not use drugs.  Family History:   Family History  Problem Relation Age of Onset   Diabetes Mother    Heart disease Mother    Heart attack Father    Diabetes Brother    Breast cancer Maternal Grandmother    Breast cancer Maternal Aunt    Colon cancer Neg Hx    Colon polyps Neg Hx     A comprehensive review of systems was negative. Weight change:   Intake/Output  Summary (Last 24 hours) at 03/07/2021 1005 Last data filed at 03/07/2021 0025 Gross per 24 hour  Intake 635.97 ml  Output --  Net 635.97 ml   BP 136/61    Pulse 71    Temp 98.4 F (36.9 C) (Oral)    Resp 16    Ht $R'5\' 6"'hh$  (1.676 m)    Wt 76.2 kg    SpO2 100%    BMI 27.11 kg/m  Vitals:   03/06/21 1115 03/06/21 1531 03/07/21 0603 03/07/21 0950  BP:  (!) 160/74 (!) 148/63 136/61  Pulse: 64 66 69 71  Resp: 19 16    Temp:  98.6 F (37 C) 98.4 F (36.9 C)   TempSrc:  Oral Oral   SpO2: 100% 100% 100%   Weight:  76.2 kg    Height:  $Remove'5\' 6"'SQxfdOv$  (1.676 m)       General appearance: alert, cooperative, and no distress Head: Normocephalic, without obvious abnormality, atraumatic Resp: clear to auscultation bilaterally Cardio: regular rate and rhythm, S1, S2 normal, no murmur, click, rub or gallop GI: soft, non-tender; bowel sounds normal; no masses,  no organomegaly Extremities: extremities normal, atraumatic, no cyanosis or edema  Labs: Basic Metabolic Panel: Recent Labs  Lab 03/05/21 0959 03/06/21 1044 03/07/21 0707  NA 135  --  140  K 4.9  --  5.1  CL 104  --  109  CO2 21*  --  19*  GLUCOSE 101*  --  85  BUN 62*  --  59*  CREATININE 4.94*  --  4.85*  ALBUMIN 3.4*  --   --   CALCIUM 8.3*  --  8.1*  PHOS  --  5.1*  --    Liver Function Tests: Recent Labs  Lab 03/05/21 0959  AST 14*  ALT 9  ALKPHOS 63  BILITOT 0.6  PROT 7.8  ALBUMIN 3.4*   No results for input(s): LIPASE, AMYLASE in the last 168 hours. No results for input(s): AMMONIA in the last 168 hours. CBC: Recent Labs  Lab 03/05/21 0959 03/07/21 0707  WBC 5.3 4.8  NEUTROABS 3.8  --   HGB 8.4* 7.0*  HCT 26.4* 22.6*  MCV 88.6 93.8  PLT 258 226   PT/INR: $Remove'@LABRCNTIP'ziyaelp$ (inr:5) Cardiac Enzymes: ) Recent Labs  Lab 03/06/21 1044  CKTOTAL 38   CBG: No results for input(s): GLUCAP in the last 168 hours.  Iron Studies:  Recent Labs  Lab 03/05/21 0959  IRON 67  TIBC 225*  FERRITIN 338*    Xrays/Other  Studies: US Renal  Result Date: 03/06/2021 CLINICAL DATA:  Renal failure EXAM: RENAL / URINARY TRACT ULTRASOUND COMPLETE COMPARISON:  None. FINDINGS: Right Kidney: Renal measurements: 11.3 x 5.6 x 5.5 cm = volume: 180.6 mL. Echogenicity increased. No mass or hydronephrosis visualized. Left Kidney: Suboptimally visualized. Renal measurements: 11.1 x 6 x 5.5 cm = volume: 191.1 mL. Echogenicity increased. No mass or hydronephrosis visualized. Bladder: Appears normal for degree of bladder distention. Other: None. IMPRESSION: No hydronephrosis. Increased renal echogenicity suggesting medical renal disease. Electronically Signed   By: Macy Mis M.D.   On: 03/06/2021 12:32     Assessment/Plan:  AKI vs subacute - Scr started rising at the end of November and has continued to rise.  She is completely asymptomatic and no inciting event or meds can be identified.  UA with some leukocytes but large blood.  [place] without obstruction but increased echogenicity.  No history of stones.   Renal dose meds Avoid nephrotoxic agents such as NSAIDs, IV contrast, phosphate containing bowel preps (FLEETS) Will order acute GN workup but she may require a kidney biopsy if her renal function continues to deteriorate.  Continue with IVF's Continue to hold entresto and aldactone for now No indication for dialysis as she is completely asymptomatic. Iron deficiency anemia - followed by hematology.  Hgb down to 7, recommend transfuse 2 units and follow.  GI already with full workup.    Broadus John A Latricia Cerrito 03/07/2021, 10:05 AM

## 2021-03-07 NOTE — Progress Notes (Signed)
PROGRESS NOTE    Mackenzie [name]  WVP:[id number] DOB: [birth year] DOA: 03/06/2021 PCP: [name], MD   Brief Narrative:  This 71 years old female with PMH significant for systolic and diastolic CHF, hyperlipidemia, iron deficiency anemia, atrial fibrillation, GERD presented in the ED with abnormal labs.  Patient has been following up with her hematologist ,  routine work-up was obtained and showed serum creatinine 4.94 Patient was advised by hematologist to get admitted for acute kidney injury. Patient is admitted for acute kidney injury, nephrology is consulted continued on IV hydration she might require renal biopsy if her creatinine continues to deteriorate.  Assessment & Plan:   Principal Problem:   Acute on chronic renal failure (HCC) Active Problems:   Chronic systolic heart failure (HCC)   HTN (hypertension)   Acute renal failure superimposed on stage 3a chronic kidney disease (HCC)  Acute kidney injury on chronic kidney disease 3A: Baseline serum creatinine 1.0-1.3. Creatinine on presentation 4.94 Etiology unclear, suspect a degree of volume depletion. Renal ultrasound: No hydronephrosis, increased renal echogenicity. Nephrology consulted,  recommended to continue IV hydration. If serum creatinine does not improve may require renal biopsy for glomerulonephritis work-up Continue to hold Entresto and spironolactone. Continue to monitor serum creatinine  Chronic diastolic and systolic CHF: Clinically she appears euvolemic. Last echocardiogram 12/27/2020 shows LVEF 60 to 65%, raw no regional wall motion abnormalities. Continue to hold Entresto, spironolactone, continue carvedilol  Atrial fibrillation: Heart rate controlled, Continue carvedilol and Eliquis.  Hyperlipidemia: Continue statins  Essential hypertension: Continue carvedilol  Iron deficiency anemia: Continue ferrous sulfate with  DVT prophylaxis: Heparin Code Status: Full code. Family Communication:  Daughter at bed side. Disposition Plan:   Status is: Inpatient  Remains inpatient appropriate because: Admitted for acute kidney injury requiring IV hydration and nephrology evaluation.  Anticipated discharge home in 1 to 2 days once renal functions improved.  Consultants:   Nephrology  Procedures:  None  Antimicrobials: None.  Subjective: Patient was seen and examined at bedside.  Overnight events noted.  Patient reports feeling much improved.  Still has some leg swelling but reports feeling improved.  Objective: Vitals:   03/06/21 1115 03/06/21 1531 03/07/21 0603 03/07/21 0950  BP:  (!) 160/74 (!) 148/63 136/61  Pulse: 64 66 69 70  Resp: 19 16  18   Temp:  98.6 F (37 C) 98.4 F (36.9 C)   TempSrc:  Oral Oral   SpO2: 100% 100% 100% 100%  Weight:  76.2 kg    Height:  5\' 6"  (1.676 m)      Intake/Output Summary (Last 24 hours) at 03/07/2021 1429 Last data filed at 03/07/2021 0025 Gross per 24 hour  Intake 635.97 ml  Output --  Net 635.97 ml   Filed Weights   03/06/21 1008 03/06/21 1531  Weight: 75.5 kg 76.2 kg    Examination:  General exam: Appears comfortable, not in any acute distress. Respiratory system: Clear to auscultation. Respiratory effort normal.  RR 15 Cardiovascular system: S1-S2 heard, irregular rhythm, no murmur. Gastrointestinal system: Abdomen is soft, nontender, nondistended, BS+ Central nervous system: Alert and oriented X 2. No focal neurological deficits. Extremities: +Edema, no cyanosis, no clubbing. Skin: No rashes, lesions or ulcers Psychiatry: Judgement and insight appear normal. Mood & affect appropriate.     Data Reviewed: I have personally reviewed following labs and imaging studies  CBC: Recent Labs  Lab 03/05/21 0959 03/07/21 0707 03/07/21 0937  WBC 5.3 4.8  --   NEUTROABS 3.8  --   --  HGB 8.4* 7.0* 7.3*  HCT 26.4* 22.6* 24.0*  MCV 88.6 93.8  --   PLT 258 226  --    Basic Metabolic Panel: Recent Labs  Lab  03/05/21 0959 03/06/21 1044 03/07/21 0707 03/07/21 0937  NA 135  --  140  --   K 4.9  --  5.1 4.8  CL 104  --  109  --   CO2 21*  --  19*  --   GLUCOSE 101*  --  85  --   BUN 62*  --  59*  --   CREATININE 4.94*  --  4.85*  --   CALCIUM 8.3*  --  8.1*  --   MG  --  1.4*  --   --   PHOS  --  5.1*  --   --    GFR: Estimated Creatinine Clearance: 11.3 mL/min (A) (by C-G formula based on SCr of 4.85 mg/dL (H)). Liver Function Tests: Recent Labs  Lab 03/05/21 0959  AST 14*  ALT 9  ALKPHOS 63  BILITOT 0.6  PROT 7.8  ALBUMIN 3.4*   No results for input(s): LIPASE, AMYLASE in the last 168 hours. No results for input(s): AMMONIA in the last 168 hours. Coagulation Profile: No results for input(s): INR, PROTIME in the last 168 hours. Cardiac Enzymes: Recent Labs  Lab 03/06/21 1044  CKTOTAL 38   BNP (last 3 results) No results for input(s): PROBNP in the last 8760 hours. HbA1C: No results for input(s): HGBA1C in the last 72 hours. CBG: No results for input(s): GLUCAP in the last 168 hours. Lipid Profile: No results for input(s): CHOL, HDL, LDLCALC, TRIG, CHOLHDL, LDLDIRECT in the last 72 hours. Thyroid Function Tests: No results for input(s): TSH, T4TOTAL, FREET4, T3FREE, THYROIDAB in the last 72 hours. Anemia Panel: Recent Labs    03/05/21 0959  FERRITIN 338*  TIBC 225*  IRON 67   Sepsis Labs: No results for input(s): PROCALCITON, LATICACIDVEN in the last 168 hours.  Recent Results (from the past 240 hour(s))  Resp Panel by RT-PCR (Flu A&B, Covid) Nasopharyngeal Swab     Status: None   Collection Time: 03/06/21  1:11 PM   Specimen: Nasopharyngeal Swab; Nasopharyngeal(NP) swabs in vial transport medium  Result Value Ref Range Status   SARS Coronavirus 2 by RT PCR NEGATIVE NEGATIVE Final    Comment: (NOTE) SARS-CoV-2 target nucleic acids are NOT DETECTED.  The SARS-CoV-2 RNA is generally detectable in upper respiratory specimens during the acute phase of  infection. The lowest concentration of SARS-CoV-2 viral copies this assay can detect is 138 copies/mL. A negative result does not preclude SARS-Cov-2 infection and should not be used as the sole basis for treatment or other patient management decisions. A negative result may occur with  improper specimen collection/handling, submission of specimen other than nasopharyngeal swab, presence of viral mutation(s) within the areas targeted by this assay, and inadequate number of viral copies(<138 copies/mL). A negative result must be combined with clinical observations, patient history, and epidemiological information. The expected result is Negative.  Fact Sheet for Patients:  EntrepreneurPulse.com.au  Fact Sheet for Healthcare Providers:  IncredibleEmployment.be  This test is no t yet approved or cleared by the [place] FDA and  has been authorized for detection and/or diagnosis of SARS-CoV-2 by FDA under an Emergency Use Authorization (EUA). This EUA will remain  in effect (meaning this test can be used) for the duration of the COVID-19 declaration under Section 564(b)(1) of the Act, 21 U.S.C.section  360bbb-3(b)(1), unless the authorization is terminated  or revoked sooner.       Influenza A by PCR NEGATIVE NEGATIVE Final   Influenza B by PCR NEGATIVE NEGATIVE Final    Comment: (NOTE) The Xpert Xpress SARS-CoV-2/FLU/RSV plus assay is intended as an aid in the diagnosis of influenza from Nasopharyngeal swab specimens and should not be used as a sole basis for treatment. Nasal washings and aspirates are unacceptable for Xpert Xpress SARS-CoV-2/FLU/RSV testing.  Fact Sheet for Patients: EntrepreneurPulse.com.au  Fact Sheet for Healthcare Providers: IncredibleEmployment.be  This test is not yet approved or cleared by the [place] FDA and has been authorized for detection and/or diagnosis of SARS-CoV-2  by FDA under an Emergency Use Authorization (EUA). This EUA will remain in effect (meaning this test can be used) for the duration of the COVID-19 declaration under Section 564(b)(1) of the Act, 21 U.S.C. section 360bbb-3(b)(1), unless the authorization is terminated or revoked.  Performed at [facility], [address] [place], [place] [address]     Radiology Studies: US Renal  Result Date: 03/06/2021 CLINICAL DATA:  Renal failure EXAM: RENAL / URINARY TRACT ULTRASOUND COMPLETE COMPARISON:  None. FINDINGS: Right Kidney: Renal measurements: 11.3 x 5.6 x 5.5 cm = volume: 180.6 mL. Echogenicity increased. No mass or hydronephrosis visualized. Left Kidney: Suboptimally visualized. Renal measurements: 11.1 x 6 x 5.5 cm = volume: 191.1 mL. Echogenicity increased. No mass or hydronephrosis visualized. Bladder: Appears normal for degree of bladder distention. Other: None. IMPRESSION: No hydronephrosis. Increased renal echogenicity suggesting medical renal disease. Electronically Signed   By: Macy Mis M.D.   On: 03/06/2021 12:32    Scheduled Meds:  apixaban  5 mg Oral BID   atorvastatin  40 mg Oral q1800   carvedilol  12.5 mg Oral BID WC   ferrous sulfate  325 mg Oral Daily   pantoprazole  40 mg Oral Daily   Continuous Infusions:  sodium chloride 50 mL/hr at 03/07/21 1000     LOS: 1 day    Time spent: Garceno, MD [facility]   If 7PM-7AM, please contact night-coverage

## 2021-03-08 ENCOUNTER — Inpatient Hospital Stay: Payer: Medicare Other

## 2021-03-08 ENCOUNTER — Inpatient Hospital Stay: Payer: Medicare Other | Admitting: Licensed Clinical Social Worker

## 2021-03-08 LAB — CBC
HCT: 22.7 % — ABNORMAL LOW (ref 36.0–46.0)
Hemoglobin: 7.1 g/dL — ABNORMAL LOW (ref 12.0–15.0)
MCH: 29.1 pg (ref 26.0–34.0)
MCHC: 31.3 g/dL (ref 30.0–36.0)
MCV: 93 fL (ref 80.0–100.0)
Platelets: 221 10*3/uL (ref 150–400)
RBC: 2.44 MIL/uL — ABNORMAL LOW (ref 3.87–5.11)
RDW: 16.1 % — ABNORMAL HIGH (ref 11.5–15.5)
WBC: 4.6 10*3/uL (ref 4.0–10.5)
nRBC: 0 % (ref 0.0–0.2)

## 2021-03-08 LAB — RENAL FUNCTION PANEL
Albumin: 2.9 g/dL — ABNORMAL LOW (ref 3.5–5.0)
Anion gap: 10 (ref 5–15)
BUN: 60 mg/dL — ABNORMAL HIGH (ref 8–23)
CO2: 20 mmol/L — ABNORMAL LOW (ref 22–32)
Calcium: 8.1 mg/dL — ABNORMAL LOW (ref 8.9–10.3)
Chloride: 109 mmol/L (ref 98–111)
Creatinine, Ser: 5.02 mg/dL — ABNORMAL HIGH (ref 0.44–1.00)
GFR, Estimated: 9 mL/min — ABNORMAL LOW (ref >60)
Glucose, Bld: 84 mg/dL (ref 70–99)
Phosphorus: 4.9 mg/dL — ABNORMAL HIGH (ref 2.5–4.6)
Potassium: 5.2 mmol/L — ABNORMAL HIGH (ref 3.5–5.1)
Sodium: 139 mmol/L (ref 135–145)

## 2021-03-08 LAB — POTASSIUM: Potassium: 4.8 mmol/L (ref 3.5–5.1)

## 2021-03-08 LAB — URINE CULTURE: Culture: NO GROWTH

## 2021-03-08 LAB — C3 COMPLEMENT: C3 Complement: 122 mg/dL (ref 82–167)

## 2021-03-08 LAB — PREPARE RBC (CROSSMATCH)

## 2021-03-08 LAB — PHOSPHORUS: Phosphorus: 4.9 mg/dL — ABNORMAL HIGH (ref 2.5–4.6)

## 2021-03-08 LAB — MAGNESIUM: Magnesium: 1.3 mg/dL — ABNORMAL LOW (ref 1.7–2.4)

## 2021-03-08 LAB — C4 COMPLEMENT: Complement C4, Body Fluid: 23 mg/dL (ref 12–38)

## 2021-03-08 LAB — GLOMERULAR BASEMENT MEMBRANE ANTIBODIES: GBM Ab: <0.2 units (ref 0.0–0.9)

## 2021-03-08 LAB — ANTISTREPTOLYSIN O TITER: ASO: 26 IU/mL (ref 0.0–200.0)

## 2021-03-08 LAB — ANTI-DNA ANTIBODY, DOUBLE-STRANDED: ds DNA Ab: 3 IU/mL (ref 0–9)

## 2021-03-08 MED ORDER — MAGNESIUM SULFATE 2 GM/50ML IV SOLN
2.0000 g | Freq: Once | INTRAVENOUS | Status: AC
  Administered 2021-03-08: 2 g via INTRAVENOUS

## 2021-03-08 MED ORDER — SODIUM CHLORIDE 0.9% IV SOLUTION: Freq: Once | INTRAVENOUS | Status: AC

## 2021-03-08 MED ORDER — SODIUM CHLORIDE 0.9 % IV SOLN: INTRAVENOUS | Status: AC

## 2021-03-08 MED ORDER — SODIUM CHLORIDE 0.9% IV SOLUTION: Freq: Once | INTRAVENOUS | Status: DC

## 2021-03-08 NOTE — Progress Notes (Signed)
[facility] Progress Note  Name: Mackenzie [name] MRN: [id number] DOB: Jul 03, 1950   Subjective:  neither strict nor routine ins/outs are available.  One unmeasured urine void is charted for 1/17 and nothing charted at all for 1/18.  She was on IV fluids at one point but then lost IV access yesterday.  Her daughter is at bedside.  She thinks patient hasn't been drinking enough.  The biopsy is something intimidating to think about.  She will continue to think.  She is willing to get an IV again.  We discussed risks/benefits/indications for blood transfusion and she consents to PRBC's.  She didn't get any yesterday but did during a previous admission.  She feels well.  Spoke with primary team at bedside as well.   Review of systems:  Denies n/v Denies shortness of breath or chest pain    ------------- Background on consult:  Mackenzie [name] is an 71 y.o. female with a PMH significant for chronic systolic and diastolic CHF, atrial fibrillation, HLD, iron deficiency anemia, GERD, HTN, who was instructed by her Hematologist to go to the hospital due to abnormal labs.  On 03/05/21 she had labs drawn that were notable for BUN 62, Cr 4.94, Hgb 8.4.  In the ED, she was completely asymptomatic.  Afebrile and vital signs were stable.  She was admitted and started on IVF's.  We were consulted to further evaluate her AKI, however it appears to be more subacute as her Scr started to climb at the end of November as seen below.  She was admitted with symptomatic anemia at that time and underwent EGD and colonoscopy.  Since discharge her Scr continued to climb.   She denies any N/V/D, dysuria, pyuria, hematuria, urgency, frequency, or retention.  No lower extremity edema, NSAIDs, recent IV contrast, or new medications or changes to her diuretics recently. She had been on Entresto and spironalactone, but those were held upon admission  No intake or output data in the 24 hours ending 03/08/21  0932  Vitals:  Vitals:   03/07/21 0950 03/07/21 1600 03/07/21 1711 03/08/21 0448  BP: 136/61 (!) 165/76 (!) 165/76 (!) 154/70  Pulse: 70 69 69 71  Resp: 18 18    Temp:  98.5 F (36.9 C)  98.4 F (36.9 C)  TempSrc:  Oral    SpO2: 100% 100%  100%  Weight:      Height:         Physical Exam:  General adult female in bed in no acute distress HEENT normocephalic atraumatic extraocular movements intact sclera anicteric Neck supple trachea midline Lungs clear to auscultation bilaterally normal work of breathing at rest  Heart S1S2 no rub Abdomen soft nontender nondistended Extremities no edema appreciated Psych normal mood and affect Neuro alert and oriented x 3 provides hx and follows commands  Medications reviewed   Labs:  BMP Latest Ref Rng & Units 03/08/2021 03/07/2021 03/07/2021  Glucose 70 - 99 mg/dL 84 - 85  BUN 8 - 23 mg/dL 60(H) - 59(H)  Creatinine 0.44 - 1.00 mg/dL 5.02(H) - 4.85(H)  Sodium 135 - 145 mmol/L 139 - 140  Potassium 3.5 - 5.1 mmol/L 5.2(H) 4.8 5.1  Chloride 98 - 111 mmol/L 109 - 109  CO2 22 - 32 mmol/L 20(L) - 19(L)  Calcium 8.9 - 10.3 mg/dL 8.1(L) - 8.1(L)     Assessment/Plan:   Assessment/Plan:  AKI vs subacute - Scr started rising at the end of November and has continued to rise.  She is completely asymptomatic and no inciting event.  Pre-renal insults with entresto and spironolactone.  UA with some leukocytes and > 50 RBC.  [place] without obstruction but increased echogenicity.  No history of stones.  complement ok ANCA, anti-DNA, ASO, anti-GBM, and ANA are all pending.  She may require a kidney biopsy if her renal function continues to deteriorate.  Hopeful for plateau Change to renal diet PRBC's as below NS at 12 hours at 75 ml/hr Continue to hold entresto and aldactone for now  No acute indication for dialysis  Check up/cr ratio  Chronic diastolic and systolic CHF - note entresto and aldactone on hold  A fib - per primary team  HTN - reasonable  control  Iron deficiency anemia - followed by hematology.  Hgb down to 7.1.  Would recommend transfusion to optimize renal function. She does consent for PRBC's.  GI work-up  Hypomag - agree with IV repletion Elevated serum free light chains ratio - noted.    Mackenzie Desanctis, MD 03/08/2021 9:58 AM

## 2021-03-08 NOTE — Progress Notes (Signed)
PROGRESS NOTE    Mackenzie [name]  OZY:[id number] DOB: 07-Jul-1950 DOA: 03/06/2021 PCP: [name], MD   Brief Narrative:  This 71 years old female with PMH significant for systolic and diastolic CHF, hyperlipidemia, iron deficiency anemia, atrial fibrillation, GERD presented in the ED with abnormal labs.  Patient has been following up with her hematologist ,  routine work-up was obtained and showed serum creatinine 4.94 . Patient was advised by hematologist to get admitted for acute kidney injury. Patient is admitted for acute kidney injury, nephrology is consulted,  continued on IV hydration.  She might require renal biopsy if her creatinine continues to deteriorate.  Assessment & Plan:   Principal Problem:   Acute on chronic renal failure (HCC) Active Problems:   Chronic systolic heart failure (HCC)   HTN (hypertension)   Acute renal failure superimposed on stage 3a chronic kidney disease (HCC)  Acute kidney injury on chronic kidney disease 3A: Baseline serum creatinine 1.0-1.3. Creatinine on presentation 4.94 >5.02 Etiology unclear, suspect a degree of volume depletion. Renal ultrasound: No hydronephrosis, increased renal echogenicity. Nephrology consulted,  recommended to continue IV hydration. If serum creatinine does not improve may require renal biopsy for glomerulonephritis work-up.  ANCA, anti DNA, ASO, NP GBM, ANA all are pending. Continue to hold Entresto and spironolactone. Continue to monitor serum creatinine  Chronic diastolic and systolic CHF: Clinically she appears euvolemic. Last echocardiogram 12/27/2020 shows LVEF 60 to 65%, raw no regional wall motion abnormalities. Continue to hold Entresto, spironolactone, continue carvedilol.  Atrial fibrillation: Heart rate controlled, Continue carvedilol and Eliquis.  Hyperlipidemia: Continue statins.  Essential hypertension: Continue carvedilol.  Iron deficiency anemia: Continue ferrous sulfate.  Hemoglobin  slightly dropped to 7.1.   Transfuse 1 unit PRBC.  DVT prophylaxis: Heparin Code Status: Full code. Family Communication: Daughter at bed side. Disposition Plan:   Status is: Inpatient  Remains inpatient appropriate because: Admitted for acute kidney injury requiring IV hydration and nephrology evaluation.  Anticipated discharge home in 1 to 2 days once renal functions improved.  Consultants:   Nephrology  Procedures:  None  Antimicrobials: None.  Subjective: Patient was seen and examined at bedside.  Overnight events noted.  She reports feeling frustrated.  She does not have any IV access, attempted twice but was unsuccessful.  Patient refused to have PICC line.  She is agreeable to blood transfusion.  Objective: Vitals:   03/08/21 0448 03/08/21 1030 03/08/21 1207 03/08/21 1237  BP: (!) 154/70 (!) 141/58 (!) 161/67 (!) 160/66  Pulse: 71 69 68 65  Resp:   12   Temp: 98.4 F (36.9 C)  97.9 F (36.6 C) 98.5 F (36.9 C)  TempSrc:   Oral Oral  SpO2: 100%  100% 100%  Weight:      Height:        Intake/Output Summary (Last 24 hours) at 03/08/2021 1254 Last data filed at 03/08/2021 1051 Gross per 24 hour  Intake 240 ml  Output --  Net 240 ml   Filed Weights   03/06/21 1008 03/06/21 1531  Weight: 75.5 kg 76.2 kg    Examination:  General exam: Comfortable, not in any acute distress. Respiratory system: Clear to auscultation bilaterally. Respiratory effort normal.  RR 15 Cardiovascular system: S1-S2 heard, irregular rhythm, no murmur. Gastrointestinal system: Abdomen is soft, nontender, nondistended, BS+ Central nervous system: Alert and oriented X 2. No focal neurological deficits. Extremities: +Edema, no cyanosis, no clubbing. Skin: No rashes, lesions or ulcers Psychiatry: Judgement and insight appear normal. Mood &  affect appropriate.     Data Reviewed: I have personally reviewed following labs and imaging studies  CBC: Recent Labs  Lab 03/05/21 0959  03/07/21 0707 03/07/21 0937 03/08/21 0556  WBC 5.3 4.8  --  4.6  NEUTROABS 3.8  --   --   --   HGB 8.4* 7.0* 7.3* 7.1*  HCT 26.4* 22.6* 24.0* 22.7*  MCV 88.6 93.8  --  93.0  PLT 258 226  --  030   Basic Metabolic Panel: Recent Labs  Lab 03/05/21 0959 03/06/21 1044 03/07/21 0707 03/07/21 0937 03/08/21 0556 03/08/21 1015  NA 135  --  140  --  139  --   K 4.9  --  5.1 4.8 5.2* 4.8  CL 104  --  109  --  109  --   CO2 21*  --  19*  --  20*  --   GLUCOSE 101*  --  85  --  84  --   BUN 62*  --  59*  --  60*  --   CREATININE 4.94*  --  4.85*  --  5.02*  --   CALCIUM 8.3*  --  8.1*  --  8.1*  --   MG  --  1.4*  --   --  1.3*  --   PHOS  --  5.1*  --   --  4.9*   4.9*  --    GFR: Estimated Creatinine Clearance: 10.9 mL/min (A) (by C-G formula based on SCr of 5.02 mg/dL (H)). Liver Function Tests: Recent Labs  Lab 03/05/21 0959 03/08/21 0556  AST 14*  --   ALT 9  --   ALKPHOS 63  --   BILITOT 0.6  --   PROT 7.8  --   ALBUMIN 3.4* 2.9*   No results for input(s): LIPASE, AMYLASE in the last 168 hours. No results for input(s): AMMONIA in the last 168 hours. Coagulation Profile: No results for input(s): INR, PROTIME in the last 168 hours. Cardiac Enzymes: Recent Labs  Lab 03/06/21 1044  CKTOTAL 38   BNP (last 3 results) No results for input(s): PROBNP in the last 8760 hours. HbA1C: No results for input(s): HGBA1C in the last 72 hours. CBG: No results for input(s): GLUCAP in the last 168 hours. Lipid Profile: No results for input(s): CHOL, HDL, LDLCALC, TRIG, CHOLHDL, LDLDIRECT in the last 72 hours. Thyroid Function Tests: No results for input(s): TSH, T4TOTAL, FREET4, T3FREE, THYROIDAB in the last 72 hours. Anemia Panel: No results for input(s): VITAMINB12, FOLATE, FERRITIN, TIBC, IRON, RETICCTPCT in the last 72 hours.  Sepsis Labs: No results for input(s): PROCALCITON, LATICACIDVEN in the last 168 hours.  Recent Results (from the past 240 hour(s))  Urine  Culture     Status: None   Collection Time: 03/06/21 10:44 AM   Specimen: Urine, Clean Catch  Result Value Ref Range Status   Specimen Description   Final    URINE, CLEAN CATCH Performed at [facility], [address], [place], [place] [address]    Special Requests   Final    NONE Performed at [facility], [address], [place], [place] [address]    Culture   Final    NO GROWTH Performed at [facility], [place] [address], [place], [place] [address]    Report Status 03/08/2021 FINAL  Final  Resp Panel by RT-PCR (Flu A&B, Covid) Nasopharyngeal Swab     Status: None   Collection Time: 03/06/21  1:11 PM  Specimen: Nasopharyngeal Swab; Nasopharyngeal(NP) swabs in vial transport medium  Result Value Ref Range Status   SARS Coronavirus 2 by RT PCR NEGATIVE NEGATIVE Final    Comment: (NOTE) SARS-CoV-2 target nucleic acids are NOT DETECTED.  The SARS-CoV-2 RNA is generally detectable in upper respiratory specimens during the acute phase of infection. The lowest concentration of SARS-CoV-2 viral copies this assay can detect is 138 copies/mL. A negative result does not preclude SARS-Cov-2 infection and should not be used as the sole basis for treatment or other patient management decisions. A negative result may occur with  improper specimen collection/handling, submission of specimen other than nasopharyngeal swab, presence of viral mutation(s) within the areas targeted by this assay, and inadequate number of viral copies(<138 copies/mL). A negative result must be combined with clinical observations, patient history, and epidemiological information. The expected result is Negative.  Fact Sheet for Patients:  EntrepreneurPulse.com.au  Fact Sheet for Healthcare Providers:  IncredibleEmployment.be  This test is no t yet approved or cleared by the [place] FDA and  has been authorized for detection and/or diagnosis of SARS-CoV-2 by FDA  under an Emergency Use Authorization (EUA). This EUA will remain  in effect (meaning this test can be used) for the duration of the COVID-19 declaration under Section 564(b)(1) of the Act, 21 U.S.C.section 360bbb-3(b)(1), unless the authorization is terminated  or revoked sooner.       Influenza A by PCR NEGATIVE NEGATIVE Final   Influenza B by PCR NEGATIVE NEGATIVE Final    Comment: (NOTE) The Xpert Xpress SARS-CoV-2/FLU/RSV plus assay is intended as an aid in the diagnosis of influenza from Nasopharyngeal swab specimens and should not be used as a sole basis for treatment. Nasal washings and aspirates are unacceptable for Xpert Xpress SARS-CoV-2/FLU/RSV testing.  Fact Sheet for Patients: EntrepreneurPulse.com.au  Fact Sheet for Healthcare Providers: IncredibleEmployment.be  This test is not yet approved or cleared by the [place] FDA and has been authorized for detection and/or diagnosis of SARS-CoV-2 by FDA under an Emergency Use Authorization (EUA). This EUA will remain in effect (meaning this test can be used) for the duration of the COVID-19 declaration under Section 564(b)(1) of the Act, 21 U.S.C. section 360bbb-3(b)(1), unless the authorization is terminated or revoked.  Performed at [facility], [address], [place], [place] [address]     Radiology Studies: No results found.  Scheduled Meds:  sodium chloride   Intravenous Once   sodium chloride   Intravenous Once   apixaban  5 mg Oral BID   atorvastatin  40 mg Oral q1800   carvedilol  12.5 mg Oral BID WC   ferrous sulfate  325 mg Oral Daily   pantoprazole  40 mg Oral Daily   Continuous Infusions:  sodium chloride     magnesium sulfate bolus IVPB       LOS: 2 days    Time spent: [place], MD [facility]   If 7PM-7AM, please contact night-coverage

## 2021-03-09 LAB — CBC
HCT: 24 % — ABNORMAL LOW (ref 36.0–46.0)
Hemoglobin: 7.4 g/dL — ABNORMAL LOW (ref 12.0–15.0)
MCH: 28.4 pg (ref 26.0–34.0)
MCHC: 30.8 g/dL (ref 30.0–36.0)
MCV: 92 fL (ref 80.0–100.0)
Platelets: 211 10*3/uL (ref 150–400)
RBC: 2.61 MIL/uL — ABNORMAL LOW (ref 3.87–5.11)
RDW: 16 % — ABNORMAL HIGH (ref 11.5–15.5)
WBC: 4.8 10*3/uL (ref 4.0–10.5)
nRBC: 0 % (ref 0.0–0.2)

## 2021-03-09 LAB — RENAL FUNCTION PANEL
Albumin: 2.8 g/dL — ABNORMAL LOW (ref 3.5–5.0)
Anion gap: 10 (ref 5–15)
BUN: 55 mg/dL — ABNORMAL HIGH (ref 8–23)
CO2: 20 mmol/L — ABNORMAL LOW (ref 22–32)
Calcium: 8.2 mg/dL — ABNORMAL LOW (ref 8.9–10.3)
Chloride: 109 mmol/L (ref 98–111)
Creatinine, Ser: 4.78 mg/dL — ABNORMAL HIGH (ref 0.44–1.00)
GFR, Estimated: 9 mL/min — ABNORMAL LOW (ref >60)
Glucose, Bld: 80 mg/dL (ref 70–99)
Phosphorus: 4.7 mg/dL — ABNORMAL HIGH (ref 2.5–4.6)
Potassium: 5.1 mmol/L (ref 3.5–5.1)
Sodium: 139 mmol/L (ref 135–145)

## 2021-03-09 LAB — TYPE AND SCREEN
ABO/RH(D): O POS
Antibody Screen: NEGATIVE
Unit division: 0

## 2021-03-09 LAB — ANCA TITERS
Atypical P-ANCA titer: <1:20 {titer}
C-ANCA: <1:20 {titer}
P-ANCA: 1:640 {titer} — ABNORMAL HIGH

## 2021-03-09 LAB — PROTEIN / CREATININE RATIO, URINE
Creatinine, Urine: 56.28 mg/dL
Protein Creatinine Ratio: 0.41 mg/mg{Cre} — ABNORMAL HIGH (ref 0.00–0.15)
Total Protein, Urine: 23 mg/dL

## 2021-03-09 LAB — BPAM RBC
Blood Product Expiration Date: 202302212359
ISSUE DATE / TIME: 202301191158
Unit Type and Rh: 5100

## 2021-03-09 LAB — COMPLEMENT, TOTAL: Compl, Total (CH50): 56 U/mL (ref >41)

## 2021-03-09 LAB — MAGNESIUM: Magnesium: 1.7 mg/dL (ref 1.7–2.4)

## 2021-03-09 LAB — ANTINUCLEAR ANTIBODIES, IFA: ANA Ab, IFA: NEGATIVE

## 2021-03-09 MED ORDER — SODIUM CHLORIDE 0.9 % IV SOLN: INTRAVENOUS | Status: AC

## 2021-03-09 MED ORDER — AMLODIPINE BESYLATE 5 MG PO TABS
5.0000 mg | ORAL_TABLET | Freq: Every day | ORAL | Status: DC
  Administered 2021-03-09: 5 mg via ORAL
  Filled 2021-03-09 (×2): qty 1

## 2021-03-09 NOTE — Progress Notes (Signed)
PROGRESS NOTE    Mackenzie [name]  QMG:[id number] DOB: 1950/06/06 DOA: 03/06/2021 PCP: [name], MD   Brief Narrative:  This 71 years old female with PMH significant for systolic and diastolic CHF, hyperlipidemia, iron deficiency anemia, atrial fibrillation, GERD presented in the ED with abnormal labs.  Patient has been following up with her hematologist ,  routine work-up was obtained and showed serum creatinine 4.94 . Patient was advised by hematologist to get admitted for acute kidney injury. Patient is admitted for acute kidney injury, nephrology is consulted,  continued on IV hydration.  She might require renal biopsy if her creatinine continues to deteriorate.  Assessment & Plan:   Principal Problem:   Acute on chronic renal failure (HCC) Active Problems:   Chronic systolic heart failure (HCC)   HTN (hypertension)   Acute renal failure superimposed on stage 3a chronic kidney disease (HCC)  Acute kidney injury on chronic kidney disease 3A: Baseline serum creatinine 1.0-1.3. Creatinine on presentation 4.94 >5.02>4.78 Etiology unclear, suspect a degree of volume depletion. Renal ultrasound: No hydronephrosis, increased renal echogenicity. Nephrology consulted,  recommended to continue IV hydration. If serum creatinine does not improve may require renal biopsy for glomerulonephritis work-up.   ANCA, anti DNA, ASO, NP GBM, ANA all are pending. Continue to hold Entresto and spironolactone. Continue to monitor serum creatinine, showed some improvement. 4.78 No acute indication for hemodialysis.  Chronic diastolic and systolic CHF: Clinically she appears euvolemic. Last echocardiogram 12/27/2020 shows LVEF 60 to 65%, raw no regional wall motion abnormalities. Continue to hold Entresto, spironolactone, continue carvedilol.  Atrial fibrillation: Heart rate controlled,  Continue carvedilol and Eliquis. Nephrology suggests discontinuing Eliquis in anticipation for renal biopsy next  week.  Hyperlipidemia: Continue statins.  Essential hypertension: Continue carvedilol.  Iron deficiency anemia: Continue ferrous sulfate.  Hemoglobin slightly dropped to 7.1.   S/p 1 unit PRBC> Hb 7.4 Continue to monitor H&H.  DVT prophylaxis: Elquis Code Status: Full code. Family Communication: Daughter at bed side. Disposition Plan:   Status is: Inpatient  Remains inpatient appropriate because: Admitted for acute kidney injury requiring IV hydration and nephrology evaluation.  She may require renal biopsy for diagnosis.  Anticipated discharge home in 1 to 2 days once renal functions improved.  Consultants:   Nephrology  Procedures:  None  Antimicrobials: None.  Subjective: Patient was seen and examined at bedside.  Overnight events noted.  She reports feeling frustrated.  She does not have any IV access, attempted twice but was unsuccessful.  Patient refused to have PICC line.  She is agreeable to blood transfusion.  Objective: Vitals:   03/08/21 1446 03/08/21 2027 03/09/21 0447 03/09/21 0849  BP: (!) 153/64 (!) 166/69 (!) 157/69 (!) 171/68  Pulse: 69 67 69 62  Resp: 16 18    Temp: 98.5 F (36.9 C) 97.9 F (36.6 C) 97.9 F (36.6 C)   TempSrc: Oral Oral Oral   SpO2: 100% 100% 97%   Weight:      Height:        Intake/Output Summary (Last 24 hours) at 03/09/2021 1151 Last data filed at 03/09/2021 1123 Gross per 24 hour  Intake 1524.92 ml  Output 200 ml  Net 1324.92 ml   Filed Weights   03/06/21 1008 03/06/21 1531  Weight: 75.5 kg 76.2 kg    Examination:  General exam: Appears comfortable, not in any acute distress, anxious. Respiratory system: Clear to auscultation bilaterally. Respiratory effort normal.  RR 15 Cardiovascular system: S1-S2 heard, irregular rhythm, no murmur.  Gastrointestinal system: Abdomen is soft, nontender, nondistended, BS+ Central nervous system: Alert and oriented X 2. No focal neurological deficits. Extremities: +Edema, no  cyanosis, no clubbing. Skin: No rashes, lesions or ulcers Psychiatry: Judgement and insight appear normal. Mood & affect appropriate.     Data Reviewed: I have personally reviewed following labs and imaging studies  CBC: Recent Labs  Lab 03/05/21 0959 03/07/21 0707 03/07/21 0937 03/08/21 0556 03/09/21 0513  WBC 5.3 4.8  --  4.6 4.8  NEUTROABS 3.8  --   --   --   --   HGB 8.4* 7.0* 7.3* 7.1* 7.4*  HCT 26.4* 22.6* 24.0* 22.7* 24.0*  MCV 88.6 93.8  --  93.0 92.0  PLT 258 226  --  221 161   Basic Metabolic Panel: Recent Labs  Lab 03/05/21 0959 03/06/21 1044 03/07/21 0707 03/07/21 0937 03/08/21 0556 03/08/21 1015 03/09/21 0513  NA 135  --  140  --  139  --  139  K 4.9  --  5.1 4.8 5.2* 4.8 5.1  CL 104  --  109  --  109  --  109  CO2 21*  --  19*  --  20*  --  20*  GLUCOSE 101*  --  85  --  84  --  80  BUN 62*  --  59*  --  60*  --  55*  CREATININE 4.94*  --  4.85*  --  5.02*  --  4.78*  CALCIUM 8.3*  --  8.1*  --  8.1*  --  8.2*  MG  --  1.4*  --   --  1.3*  --  1.7  PHOS  --  5.1*  --   --  4.9*   4.9*  --  4.7*   GFR: Estimated Creatinine Clearance: 11.4 mL/min (A) (by C-G formula based on SCr of 4.78 mg/dL (H)). Liver Function Tests: Recent Labs  Lab 03/05/21 0959 03/08/21 0556 03/09/21 0513  AST 14*  --   --   ALT 9  --   --   ALKPHOS 63  --   --   BILITOT 0.6  --   --   PROT 7.8  --   --   ALBUMIN 3.4* 2.9* 2.8*   No results for input(s): LIPASE, AMYLASE in the last 168 hours. No results for input(s): AMMONIA in the last 168 hours. Coagulation Profile: No results for input(s): INR, PROTIME in the last 168 hours. Cardiac Enzymes: Recent Labs  Lab 03/06/21 1044  CKTOTAL 38   BNP (last 3 results) No results for input(s): PROBNP in the last 8760 hours. HbA1C: No results for input(s): HGBA1C in the last 72 hours. CBG: No results for input(s): GLUCAP in the last 168 hours. Lipid Profile: No results for input(s): CHOL, HDL, LDLCALC, TRIG, CHOLHDL,  LDLDIRECT in the last 72 hours. Thyroid Function Tests: No results for input(s): TSH, T4TOTAL, FREET4, T3FREE, THYROIDAB in the last 72 hours. Anemia Panel: No results for input(s): VITAMINB12, FOLATE, FERRITIN, TIBC, IRON, RETICCTPCT in the last 72 hours.  Sepsis Labs: No results for input(s): PROCALCITON, LATICACIDVEN in the last 168 hours.  Recent Results (from the past 240 hour(s))  Urine Culture     Status: None   Collection Time: 03/06/21 10:44 AM   Specimen: Urine, Clean Catch  Result Value Ref Range Status   Specimen Description   Final    URINE, CLEAN CATCH Performed at [facility], [address] [place], [place] [address]  Special Requests   Final    NONE Performed at [facility], [address] [place], [place] [address]    Culture   Final    NO GROWTH Performed at [facility], [place] [address], [place], [place] [address]    Report Status 03/08/2021 FINAL  Final  Resp Panel by RT-PCR (Flu A&B, Covid) Nasopharyngeal Swab     Status: None   Collection Time: 03/06/21  1:11 PM   Specimen: Nasopharyngeal Swab; Nasopharyngeal(NP) swabs in vial transport medium  Result Value Ref Range Status   SARS Coronavirus 2 by RT PCR NEGATIVE NEGATIVE Final    Comment: (NOTE) SARS-CoV-2 target nucleic acids are NOT DETECTED.  The SARS-CoV-2 RNA is generally detectable in upper respiratory specimens during the acute phase of infection. The lowest concentration of SARS-CoV-2 viral copies this assay can detect is 138 copies/mL. A negative result does not preclude SARS-Cov-2 infection and should not be used as the sole basis for treatment or other patient management decisions. A negative result may occur with  improper specimen collection/handling, submission of specimen other than nasopharyngeal swab, presence of viral mutation(s) within the areas targeted by this assay, and inadequate number of viral copies(<138 copies/mL). A negative result must be combined with clinical  observations, patient history, and epidemiological information. The expected result is Negative.  Fact Sheet for Patients:  EntrepreneurPulse.com.au  Fact Sheet for Healthcare Providers:  IncredibleEmployment.be  This test is no t yet approved or cleared by the [place] FDA and  has been authorized for detection and/or diagnosis of SARS-CoV-2 by FDA under an Emergency Use Authorization (EUA). This EUA will remain  in effect (meaning this test can be used) for the duration of the COVID-19 declaration under Section 564(b)(1) of the Act, 21 U.S.C.section 360bbb-3(b)(1), unless the authorization is terminated  or revoked sooner.       Influenza A by PCR NEGATIVE NEGATIVE Final   Influenza B by PCR NEGATIVE NEGATIVE Final    Comment: (NOTE) The Xpert Xpress SARS-CoV-2/FLU/RSV plus assay is intended as an aid in the diagnosis of influenza from Nasopharyngeal swab specimens and should not be used as a sole basis for treatment. Nasal washings and aspirates are unacceptable for Xpert Xpress SARS-CoV-2/FLU/RSV testing.  Fact Sheet for Patients: EntrepreneurPulse.com.au  Fact Sheet for Healthcare Providers: IncredibleEmployment.be  This test is not yet approved or cleared by the [place] FDA and has been authorized for detection and/or diagnosis of SARS-CoV-2 by FDA under an Emergency Use Authorization (EUA). This EUA will remain in effect (meaning this test can be used) for the duration of the COVID-19 declaration under Section 564(b)(1) of the Act, 21 U.S.C. section 360bbb-3(b)(1), unless the authorization is terminated or revoked.  Performed at [facility], [address], [place], [place] [address]     Radiology Studies: No results found.  Scheduled Meds:  sodium chloride   Intravenous Once   amLODipine  5 mg Oral Daily   apixaban  5 mg Oral BID   atorvastatin  40 mg Oral q1800   carvedilol   12.5 mg Oral BID WC   ferrous sulfate  325 mg Oral Daily   pantoprazole  40 mg Oral Daily   Continuous Infusions:  sodium chloride 75 mL/hr at 03/09/21 1131     LOS: 3 days    Time spent: [place], MD [facility]   If 7PM-7AM, please contact night-coverage

## 2021-03-09 NOTE — Progress Notes (Signed)
[facility] Progress Note  Name: Mackenzie [name] MRN: [id number] DOB: 04/06/1950   Subjective: strict ins/outs not available but she had 6 unmeasured urine voids over 1/19.   Spoke with her daughter at bedside who recorded our conversation.  Her other daughter (an Therapist, sports) is working and they state she is not available via phone.  She confirms that she got the blood.  She is still thinking about whether or not she would want a kidney biopsy and we again discussed risks/benefits/indications today  Review of systems:  Denies n/v Denies shortness of breath or chest pain  She tried to save her urine overnight and she states it was discarded   ------------- Background on consult:  Mackenzie [name] is an 71 y.o. female with a PMH significant for chronic systolic and diastolic CHF, atrial fibrillation, HLD, iron deficiency anemia, GERD, HTN, who was instructed by her Hematologist to go to the hospital due to abnormal labs.  On 03/05/21 she had labs drawn that were notable for BUN 62, Cr 4.94, Hgb 8.4.  In the ED, she was completely asymptomatic.  Afebrile and vital signs were stable.  She was admitted and started on IVF's.  We were consulted to further evaluate her AKI, however it appears to be more subacute as her Scr started to climb at the end of November as seen below.  She was admitted with symptomatic anemia at that time and underwent EGD and colonoscopy.  Since discharge her Scr continued to climb.   She denies any N/V/D, dysuria, pyuria, hematuria, urgency, frequency, or retention.  No lower extremity edema, NSAIDs, recent IV contrast, or new medications or changes to her diuretics recently. She had been on Entresto and spironalactone, but those were held upon admission   Intake/Output Summary (Last 24 hours) at 03/09/2021 1038 Last data filed at 03/08/2021 2108 Gross per 24 hour  Intake 1524.92 ml  Output --  Net 1524.92 ml    Vitals:  Vitals:   03/08/21 1446 03/08/21 2027  03/09/21 0447 03/09/21 0849  BP: (!) 153/64 (!) 166/69 (!) 157/69 (!) 171/68  Pulse: 69 67 69 62  Resp: 16 18    Temp: 98.5 F (36.9 C) 97.9 F (36.6 C) 97.9 F (36.6 C)   TempSrc: Oral Oral Oral   SpO2: 100% 100% 97%   Weight:      Height:         Physical Exam:   General adult female in bed in no acute distress HEENT normocephalic atraumatic extraocular movements intact sclera anicteric Neck supple trachea midline Lungs clear to auscultation bilaterally normal work of breathing at rest on room air  Heart S1S2 no rub Abdomen soft nontender nondistended Extremities no edema appreciated Psych normal mood and affect Neuro alert and oriented x 3 provides hx and follows commands  Medications reviewed   Labs:  BMP Latest Ref Rng & Units 03/09/2021 03/08/2021 03/08/2021  Glucose 70 - 99 mg/dL 80 - 84  BUN 8 - 23 mg/dL 55(H) - 60(H)  Creatinine 0.44 - 1.00 mg/dL 4.78(H) - 5.02(H)  Sodium 135 - 145 mmol/L 139 - 139  Potassium 3.5 - 5.1 mmol/L 5.1 4.8 5.2(H)  Chloride 98 - 111 mmol/L 109 - 109  CO2 22 - 32 mmol/L 20(L) - 20(L)  Calcium 8.9 - 10.3 mg/dL 8.2(L) - 8.1(L)     Assessment/Plan:   Assessment/Plan:  AKI vs subacute - Scr started rising at the end of November and has continued to rise.  She is completely  asymptomatic and no inciting event.  Pre-renal insults with entresto and spironolactone.  UA with some leukocytes and > 50 RBC.  [place] without obstruction but increased echogenicity.  No history of stones.  complement ok, ASO ok. 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  She may require a kidney biopsy if her renal function does not improve.  Thankfully her Cr has at least plateaued  S/p PRBC's as below  NS at 75 ml/hr x 24 hours and will need to reassess if she would benefit from additional fluids on 1/21 Continue to hold entresto and aldactone for now  No acute indication for dialysis  Check up/cr ratio - still not obtained.  Placed communication order I have re-ordered  strict ins/outs Check post-void residual bladder scan and place foley if retains over 250 mL urine  Chronic diastolic and systolic CHF - note entresto and aldactone on hold  A fib - per primary team.  She is on eliquis and would stop this if ok with primary team in anticipation of a possible kidney biopsy next week.  HTN - Start amlodipine 5 mg daily  Iron deficiency anemia - followed by hematology. S/p PRBC's on 1/19. May be down a bit due to fluids. Watch for need for additional transfusion. GI work-up  Hypomag - s/p repletion Elevated serum free light chains ratio - noted.  I have ordered urine protein/cr ratio and I see a myeloma panel is pending  Over the weekend nephrology will try to see her as able and will follow labs  Claudia Desanctis, MD 03/09/2021  11:06 AM

## 2021-03-10 LAB — CBC
HCT: 26.2 % — ABNORMAL LOW (ref 36.0–46.0)
Hemoglobin: 8.2 g/dL — ABNORMAL LOW (ref 12.0–15.0)
MCH: 28.9 pg (ref 26.0–34.0)
MCHC: 31.3 g/dL (ref 30.0–36.0)
MCV: 92.3 fL (ref 80.0–100.0)
Platelets: 236 10*3/uL (ref 150–400)
RBC: 2.84 MIL/uL — ABNORMAL LOW (ref 3.87–5.11)
RDW: 15.9 % — ABNORMAL HIGH (ref 11.5–15.5)
WBC: 5.2 10*3/uL (ref 4.0–10.5)
nRBC: 0 % (ref 0.0–0.2)

## 2021-03-10 LAB — RENAL FUNCTION PANEL
Albumin: 2.9 g/dL — ABNORMAL LOW (ref 3.5–5.0)
Anion gap: 8 (ref 5–15)
BUN: 56 mg/dL — ABNORMAL HIGH (ref 8–23)
CO2: 20 mmol/L — ABNORMAL LOW (ref 22–32)
Calcium: 8.2 mg/dL — ABNORMAL LOW (ref 8.9–10.3)
Chloride: 110 mmol/L (ref 98–111)
Creatinine, Ser: 5.1 mg/dL — ABNORMAL HIGH (ref 0.44–1.00)
GFR, Estimated: 9 mL/min — ABNORMAL LOW (ref >60)
Glucose, Bld: 89 mg/dL (ref 70–99)
Phosphorus: 5.3 mg/dL — ABNORMAL HIGH (ref 2.5–4.6)
Potassium: 5 mmol/L (ref 3.5–5.1)
Sodium: 138 mmol/L (ref 135–145)

## 2021-03-10 LAB — MAGNESIUM: Magnesium: 1.6 mg/dL — ABNORMAL LOW (ref 1.7–2.4)

## 2021-03-10 LAB — PHOSPHORUS: Phosphorus: 5.2 mg/dL — ABNORMAL HIGH (ref 2.5–4.6)

## 2021-03-10 MED ORDER — OYSTER SHELL CALCIUM/D3 500-5 MG-MCG PO TABS
1.0000 | ORAL_TABLET | Freq: Two times a day (BID) | ORAL | Status: DC
  Administered 2021-03-10 – 2021-03-16 (×12): 1 via ORAL
  Filled 2021-03-10 (×12): qty 1

## 2021-03-10 MED ORDER — FAMOTIDINE 20 MG PO TABS
20.0000 mg | ORAL_TABLET | Freq: Every day | ORAL | Status: DC
  Administered 2021-03-10 – 2021-03-16 (×7): 20 mg via ORAL
  Filled 2021-03-10 (×7): qty 1

## 2021-03-10 MED ORDER — AMLODIPINE BESYLATE 5 MG PO TABS
10.0000 mg | ORAL_TABLET | Freq: Every day | ORAL | Status: DC
  Administered 2021-03-10 – 2021-03-14 (×5): 10 mg via ORAL
  Filled 2021-03-10 (×6): qty 2

## 2021-03-10 MED ORDER — MAGNESIUM SULFATE 2 GM/50ML IV SOLN
2.0000 g | Freq: Once | INTRAVENOUS | Status: AC
Start: 2021-03-10 — End: 2021-03-10
  Administered 2021-03-10: 2 g via INTRAVENOUS
  Filled 2021-03-10: qty 50

## 2021-03-10 MED ORDER — SODIUM CHLORIDE 0.9 % IV SOLN
500.0000 mg | Freq: Every day | INTRAVENOUS | Status: AC
  Administered 2021-03-10 – 2021-03-12 (×3): 500 mg via INTRAVENOUS
  Filled 2021-03-10 (×3): qty 8

## 2021-03-10 NOTE — Progress Notes (Signed)
PROGRESS NOTE    Mackenzie [name]  EUM:[id number] DOB: 1950/03/11 DOA: 03/06/2021 PCP: [name], MD   Brief Narrative:  This 71 years old female with PMH significant for systolic and diastolic CHF, hyperlipidemia, iron deficiency anemia, atrial fibrillation, GERD presented in the ED with abnormal labs.  Patient has been following up with her hematologist ,  routine work-up was obtained and showed serum creatinine 4.94 . Patient was advised by hematologist to get admitted for acute kidney injury. Patient is admitted for acute kidney injury, nephrology is consulted,  continued on IV hydration.  She might require renal biopsy if her creatinine continues to deteriorate.  Assessment & Plan:   Principal Problem:   Acute on chronic renal failure (HCC) Active Problems:   Chronic systolic heart failure (HCC)   HTN (hypertension)   Acute renal failure superimposed on stage 3a chronic kidney disease (HCC)  Acute kidney injury on chronic kidney disease 3A: Baseline serum creatinine 1.0-1.3. Creatinine on presentation 4.94 >5.02>4.78>5.10 Etiology unclear, suspect a degree of volume depletion. Renal ultrasound: No hydronephrosis, increased renal echogenicity. Nephrology consulted,  recommended to continue IV hydration. If serum creatinine does not improve may require renal biopsy for glomerulonephritis work-up.   ANCA, anti DNA, ASO, AntiGBM, ANA all are pending. Continue to hold Entresto and spironolactone. Continue to monitor serum creatinine, showed some improvement. 4.78 No acute indication for hemodialysis.  Chronic diastolic and systolic CHF: Clinically she appears euvolemic. Last echocardiogram 12/27/2020 shows LVEF 60 to 65%,no regional wall motion abnormalities. Continue to hold Entresto, spironolactone, continue carvedilol.  Atrial fibrillation: Heart rate controlled,  Continue carvedilol and Eliquis. Nephrology suggests discontinuing Eliquis in anticipation for renal biopsy  next week. Discontinue Eliquis 03/10/21.  Hyperlipidemia: Continue statins.  Essential hypertension: Continue carvedilol.  Iron deficiency anemia: Continue ferrous sulfate.  Hemoglobin slightly dropped to 7.1.   S/p 1 unit PRBC> Hb 7.4 >8.2 Continue to monitor H&H.  DVT prophylaxis: Elquis Code Status: Full code. Family Communication: Daughter at bed side. Disposition Plan:   Status is: Inpatient  Remains inpatient appropriate because: Admitted for acute kidney injury requiring IV hydration and nephrology evaluation.  She may require renal biopsy for diagnosis.  Anticipated discharge home in 1 to 2 days once renal functions improved.  Consultants:   Nephrology  Procedures:  None  Antimicrobials: None.  Subjective: Patient was seen and examined at bedside.  Overnight events noted. Patient reports feeling better, blood pressure is still slightly elevated, She is anxious with increasing serum creatinine again.  Objective: Vitals:   03/09/21 0849 03/09/21 1423 03/10/21 0444 03/10/21 0831  BP: (!) 171/68 (!) 140/58 (!) 160/67 (!) 152/67  Pulse: 62 70 64 66  Resp:  16 18   Temp:  97.8 F (36.6 C) 98.6 F (37 C)   TempSrc:  Oral Oral   SpO2:  98% 100%   Weight:      Height:        Intake/Output Summary (Last 24 hours) at 03/10/2021 1125 Last data filed at 03/10/2021 0900 Gross per 24 hour  Intake 1160 ml  Output 650 ml  Net 510 ml   Filed Weights   03/06/21 1008 03/06/21 1531  Weight: 75.5 kg 76.2 kg    Examination:  General exam: Appears anxious, comfortable, not in any distress. Respiratory system: Clear to auscultation bilaterally. Respiratory effort normal.  RR 15 Cardiovascular system: S1-S2 heard, irregular rhythm, no murmur. Gastrointestinal system: Abdomen is soft, nontender, nondistended, BS+ Central nervous system: Alert and oriented X 2. No  focal neurological deficits. Extremities: +Edema, no cyanosis, no clubbing. Skin: No rashes, lesions or  ulcers Psychiatry: Judgement and insight appear normal. Mood & affect appropriate.     Data Reviewed: I have personally reviewed following labs and imaging studies  CBC: Recent Labs  Lab 03/05/21 0959 03/07/21 0707 03/07/21 0937 03/08/21 0556 03/09/21 0513 03/10/21 0437  WBC 5.3 4.8  --  4.6 4.8 5.2  NEUTROABS 3.8  --   --   --   --   --   HGB 8.4* 7.0* 7.3* 7.1* 7.4* 8.2*  HCT 26.4* 22.6* 24.0* 22.7* 24.0* 26.2*  MCV 88.6 93.8  --  93.0 92.0 92.3  PLT 258 226  --  221 211 759   Basic Metabolic Panel: Recent Labs  Lab 03/05/21 0959 03/06/21 1044 03/07/21 0707 03/07/21 0937 03/08/21 0556 03/08/21 1015 03/09/21 0513 03/10/21 0437  NA 135  --  140  --  139  --  139 138  K 4.9  --  5.1 4.8 5.2* 4.8 5.1 5.0  CL 104  --  109  --  109  --  109 110  CO2 21*  --  19*  --  20*  --  20* 20*  GLUCOSE 101*  --  85  --  84  --  80 89  BUN 62*  --  59*  --  60*  --  55* 56*  CREATININE 4.94*  --  4.85*  --  5.02*  --  4.78* 5.10*  CALCIUM 8.3*  --  8.1*  --  8.1*  --  8.2* 8.2*  MG  --  1.4*  --   --  1.3*  --  1.7 1.6*  PHOS  --  5.1*  --   --  4.9*   4.9*  --  4.7* 5.2*   5.3*   GFR: Estimated Creatinine Clearance: 10.7 mL/min (A) (by C-G formula based on SCr of 5.1 mg/dL (H)). Liver Function Tests: Recent Labs  Lab 03/05/21 0959 03/08/21 0556 03/09/21 0513 03/10/21 0437  AST 14*  --   --   --   ALT 9  --   --   --   ALKPHOS 63  --   --   --   BILITOT 0.6  --   --   --   PROT 7.8  --   --   --   ALBUMIN 3.4* 2.9* 2.8* 2.9*   No results for input(s): LIPASE, AMYLASE in the last 168 hours. No results for input(s): AMMONIA in the last 168 hours. Coagulation Profile: No results for input(s): INR, PROTIME in the last 168 hours. Cardiac Enzymes: Recent Labs  Lab 03/06/21 1044  CKTOTAL 38   BNP (last 3 results) No results for input(s): PROBNP in the last 8760 hours. HbA1C: No results for input(s): HGBA1C in the last 72 hours. CBG: No results for input(s):  GLUCAP in the last 168 hours. Lipid Profile: No results for input(s): CHOL, HDL, LDLCALC, TRIG, CHOLHDL, LDLDIRECT in the last 72 hours. Thyroid Function Tests: No results for input(s): TSH, T4TOTAL, FREET4, T3FREE, THYROIDAB in the last 72 hours. Anemia Panel: No results for input(s): VITAMINB12, FOLATE, FERRITIN, TIBC, IRON, RETICCTPCT in the last 72 hours.  Sepsis Labs: No results for input(s): PROCALCITON, LATICACIDVEN in the last 168 hours.  Recent Results (from the past 240 hour(s))  Urine Culture     Status: None   Collection Time: 03/06/21 10:44 AM   Specimen: Urine, Clean Catch  Result Value Ref Range  Status   Specimen Description   Final    URINE, CLEAN CATCH Performed at [facility], [address], [place], [place] [address]    Special Requests   Final    NONE Performed at [facility], [address] [place], [place] [address]    Culture   Final    NO GROWTH Performed at [facility], [place] [address] [place], [place] [address]    Report Status 03/08/2021 FINAL  Final  Resp Panel by RT-PCR (Flu A&B, Covid) Nasopharyngeal Swab     Status: None   Collection Time: 03/06/21  1:11 PM   Specimen: Nasopharyngeal Swab; Nasopharyngeal(NP) swabs in vial transport medium  Result Value Ref Range Status   SARS Coronavirus 2 by RT PCR NEGATIVE NEGATIVE Final    Comment: (NOTE) SARS-CoV-2 target nucleic acids are NOT DETECTED.  The SARS-CoV-2 RNA is generally detectable in upper respiratory specimens during the acute phase of infection. The lowest concentration of SARS-CoV-2 viral copies this assay can detect is 138 copies/mL. A negative result does not preclude SARS-Cov-2 infection and should not be used as the sole basis for treatment or other patient management decisions. A negative result may occur with  improper specimen collection/handling, submission of specimen other than nasopharyngeal swab, presence of viral mutation(s) within the areas targeted by this  assay, and inadequate number of viral copies(<138 copies/mL). A negative result must be combined with clinical observations, patient history, and epidemiological information. The expected result is Negative.  Fact Sheet for Patients:  EntrepreneurPulse.com.au  Fact Sheet for Healthcare Providers:  IncredibleEmployment.be  This test is no t yet approved or cleared by the [place] FDA and  has been authorized for detection and/or diagnosis of SARS-CoV-2 by FDA under an Emergency Use Authorization (EUA). This EUA will remain  in effect (meaning this test can be used) for the duration of the COVID-19 declaration under Section 564(b)(1) of the Act, 21 U.S.C.section 360bbb-3(b)(1), unless the authorization is terminated  or revoked sooner.       Influenza A by PCR NEGATIVE NEGATIVE Final   Influenza B by PCR NEGATIVE NEGATIVE Final    Comment: (NOTE) The Xpert Xpress SARS-CoV-2/FLU/RSV plus assay is intended as an aid in the diagnosis of influenza from Nasopharyngeal swab specimens and should not be used as a sole basis for treatment. Nasal washings and aspirates are unacceptable for Xpert Xpress SARS-CoV-2/FLU/RSV testing.  Fact Sheet for Patients: EntrepreneurPulse.com.au  Fact Sheet for Healthcare Providers: IncredibleEmployment.be  This test is not yet approved or cleared by the [place] FDA and has been authorized for detection and/or diagnosis of SARS-CoV-2 by FDA under an Emergency Use Authorization (EUA). This EUA will remain in effect (meaning this test can be used) for the duration of the COVID-19 declaration under Section 564(b)(1) of the Act, 21 U.S.C. section 360bbb-3(b)(1), unless the authorization is terminated or revoked.  Performed at [facility], [address], [place], [place] [address]     Radiology Studies: No results found.  Scheduled Meds:  sodium chloride   Intravenous  Once   amLODipine  10 mg Oral Daily   atorvastatin  40 mg Oral q1800   carvedilol  12.5 mg Oral BID WC   ferrous sulfate  325 mg Oral Daily   pantoprazole  40 mg Oral Daily   Continuous Infusions:  sodium chloride Stopped (03/09/21 2129)     LOS: 4 days    Time spent: [place], MD [facility]  If 7PM-7AM, please contact night-coverage

## 2021-03-10 NOTE — Progress Notes (Signed)
[facility] Progress Note  Name: Mackenzie [name] MRN: [id number] DOB: 03-30-1950   Subjective: Feeling fine, no new issues.  GOod po intake.  She had epistaxis x2 in Nov and daughter said looked like a 'sore' in her nose but resolved and no ongoing sinus issues.  No f/c/night sweats.  No oral ulcers, eye pain.  Daughter says tea colored urine dating to late 12/2019.  I/Os 1160 / 850   Review of systems:  Denies n/v Denies shortness of breath or chest pain     ------------- Background on consult:  Mackenzie [name] is an 71 y.o. female with a PMH significant for chronic systolic and diastolic CHF, atrial fibrillation, HLD, iron deficiency anemia, GERD, HTN, who was instructed by her Hematologist to go to the hospital due to abnormal labs.  On 03/05/21 she had labs drawn that were notable for BUN 62, Cr 4.94, Hgb 8.4.  In the ED, she was completely asymptomatic.  Afebrile and vital signs were stable.  She was admitted and started on IVF's.  We were consulted to further evaluate her AKI, however it appears to be more subacute as her Scr started to climb at the end of November as seen below.  She was admitted with symptomatic anemia at that time and underwent EGD and colonoscopy.  Since discharge her Scr continued to climb.   She denies any N/V/D, dysuria, pyuria, hematuria, urgency, frequency, or retention.  No lower extremity edema, NSAIDs, recent IV contrast, or new medications or changes to her diuretics recently. She had been on Entresto and spironalactone, but those were held upon admission   Intake/Output Summary (Last 24 hours) at 03/10/2021 1524 Last data filed at 03/10/2021 1300 Gross per 24 hour  Intake 1160 ml  Output 650 ml  Net 510 ml     Vitals:  Vitals:   03/09/21 1423 03/10/21 0444 03/10/21 0831 03/10/21 1416  BP: (!) 140/58 (!) 160/67 (!) 152/67 (!) 141/66  Pulse: 70 64 66 70  Resp: 16 18    Temp: 97.8 F (36.6 C) 98.6 F (37 C)  97.6 F (36.4 C)   TempSrc: Oral Oral  Oral  SpO2: 98% 100%  98%  Weight:      Height:         Physical Exam:   General adult female in bed in no acute distress HEENT normocephalic atraumatic extraocular movements intact sclera anicteric Neck supple trachea midline Lungs clear to auscultation bilaterally normal work of breathing at rest on room air  Heart S1S2 no rub Abdomen soft nontender nondistended Extremities no edema appreciated Psych normal mood and affect Neuro alert and oriented x 3 provides hx and follows commands  Skin no rashes  Medications reviewed   Labs:  BMP Latest Ref Rng & Units 03/10/2021 03/09/2021 03/08/2021  Glucose 70 - 99 mg/dL 89 80 -  BUN 8 - 23 mg/dL 56(H) 55(H) -  Creatinine 0.44 - 1.00 mg/dL 5.10(H) 4.78(H) -  Sodium 135 - 145 mmol/L 138 139 -  Potassium 3.5 - 5.1 mmol/L 5.0 5.1 4.8  Chloride 98 - 111 mmol/L 110 109 -  CO2 22 - 32 mmol/L 20(L) 20(L) -  Calcium 8.9 - 10.3 mg/dL 8.2(L) 8.2(L) -     Assessment/Plan:   Assessment/Plan:  AKI vs subacute - Scr started rising at the end of November and has continued to rise.  She is completely asymptomatic and no inciting event.  Pre-renal insults with entresto and spironolactone.  UA with some leukocytes and >  50 RBC. UP/C 0.41.  [place] without obstruction but increased echogenicity.  No history of stones.   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  Start H2 blocker and Ca+D with steroids.  Check G6PDH for PJP prophylaxis planning. Plan for renal biopsy - need to clarify how long eliquis off and logistically how to send to [facility] for biopsy.   She is taking normal PO now so can hold on fluids; resume PRN Continue to hold entresto and aldactone for now  No acute indication for dialysis  Cont to follow strict I/Os Follow post-void  residual bladder scan and place foley if retains over 250 mL urine  Chronic diastolic and systolic CHF - note entresto and aldactone on hold.  Hold IVF today - volume status looks ok; can give diuetics PRN. A fib - per primary team.  She is on eliquis and this is on hold for renal biopsy.  Last dose was 1/20 PM. HTN - Started amlodipine 5 mg daily  - may need to titrate if still high tomorrow. Iron deficiency anemia - followed by hematology. S/p PRBC's on 1/19. May be down a bit due to fluids. Watch for need for additional transfusion. GI work-up  Hypomag - s/p repletion Elevated serum free light chains ratio - noted.  Up/C 0.41. I see a myeloma panel is pending still.   Long bedside discussion with 2 family members, I think both daughters.  Nephrology will follow, call with concerns.   Justin Mend, MD 03/10/2021  3:24 PM

## 2021-03-11 LAB — CBC
HCT: 28.4 % — ABNORMAL LOW (ref 36.0–46.0)
Hemoglobin: 9.1 g/dL — ABNORMAL LOW (ref 12.0–15.0)
MCH: 29.3 pg (ref 26.0–34.0)
MCHC: 32 g/dL (ref 30.0–36.0)
MCV: 91.3 fL (ref 80.0–100.0)
Platelets: 259 10*3/uL (ref 150–400)
RBC: 3.11 MIL/uL — ABNORMAL LOW (ref 3.87–5.11)
RDW: 15.7 % — ABNORMAL HIGH (ref 11.5–15.5)
WBC: 4.1 10*3/uL (ref 4.0–10.5)
nRBC: 0 % (ref 0.0–0.2)

## 2021-03-11 LAB — RENAL FUNCTION PANEL
Albumin: 3 g/dL — ABNORMAL LOW (ref 3.5–5.0)
Anion gap: 10 (ref 5–15)
BUN: 63 mg/dL — ABNORMAL HIGH (ref 8–23)
CO2: 18 mmol/L — ABNORMAL LOW (ref 22–32)
Calcium: 8.7 mg/dL — ABNORMAL LOW (ref 8.9–10.3)
Chloride: 106 mmol/L (ref 98–111)
Creatinine, Ser: 5.2 mg/dL — ABNORMAL HIGH (ref 0.44–1.00)
GFR, Estimated: 8 mL/min — ABNORMAL LOW (ref >60)
Glucose, Bld: 149 mg/dL — ABNORMAL HIGH (ref 70–99)
Phosphorus: 5.1 mg/dL — ABNORMAL HIGH (ref 2.5–4.6)
Potassium: 5.1 mmol/L (ref 3.5–5.1)
Sodium: 134 mmol/L — ABNORMAL LOW (ref 135–145)

## 2021-03-11 LAB — HEPATITIS B SURFACE ANTIGEN: Hepatitis B Surface Ag: NONREACTIVE

## 2021-03-11 LAB — PHOSPHORUS: Phosphorus: 5.1 mg/dL — ABNORMAL HIGH (ref 2.5–4.6)

## 2021-03-11 LAB — HEPATITIS C ANTIBODY: HCV Ab: NONREACTIVE

## 2021-03-11 LAB — MAGNESIUM: Magnesium: 1.9 mg/dL (ref 1.7–2.4)

## 2021-03-11 NOTE — Progress Notes (Signed)
PROGRESS NOTE    Mackenzie [name]  HTD:[id number] DOB: [birth year] DOA: 03/06/2021 PCP: [name], MD   Brief Narrative:  This 71 years old female with PMH significant for systolic and diastolic CHF, hyperlipidemia, iron deficiency anemia, atrial fibrillation, GERD presented in the ED with abnormal labs.  Patient has been following up with her hematologist ,  routine work-up was obtained and showed serum creatinine 4.94 . Patient was advised by hematologist to get admitted for acute kidney injury. Patient is admitted for acute kidney injury, nephrology is consulted,  continued on IV hydration.  She might require renal biopsy if her creatinine continues to deteriorate.  Assessment & Plan:   Principal Problem:   Acute on chronic renal failure (HCC) Active Problems:   Chronic systolic heart failure (HCC)   HTN (hypertension)   Acute renal failure superimposed on stage 3a chronic kidney disease (HCC)  Acute kidney injury on chronic kidney disease 3A: Baseline serum creatinine 1.0-1.3. Creatinine on presentation 4.94 >5.02>4.78>5.10>5.20 Etiology unclear, suspect a degree of volume depletion. Renal ultrasound: No hydronephrosis, increased renal echogenicity. Nephrology consulted,  recommended to continue IV hydration. If serum creatinine does not improve may require renal biopsy for glomerulonephritis work-up.   C-ANCA: Neg, anti DNA: Neg, ASO: Neg, AntiGBM, Neg, ANA : Neg. 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 Continue to monitor serum creatinine, showed some improvement. 4.78 No acute indication for hemodialysis. Eliquis discontinued.  Patient will need renal biopsy early next week.  Chronic diastolic and systolic CHF: Clinically she appears euvolemic. Last echocardiogram 12/27/2020 shows LVEF 60 to 65%,no regional wall motion abnormalities. Continue to hold Entresto,  spironolactone, continue carvedilol.  Atrial fibrillation: Heart rate controlled,  Continue carvedilol and Eliquis. Nephrology suggests discontinuing Eliquis in anticipation for renal biopsy next week. Discontinue Eliquis 03/10/21.  Hyperlipidemia: Continue statins.  Essential hypertension: Continue carvedilol.  Iron deficiency anemia: Continue ferrous sulfate.  Hemoglobin slightly dropped to 7.1.   S/p 1 unit PRBC> Hb 7.4 >8.2 >9.2 Continue to monitor H&H.  DVT prophylaxis: Elquis Code Status: Full code. Family Communication: Daughter at bed side. Disposition Plan:   Status is: Inpatient  Remains inpatient appropriate because:  Admitted for acute kidney injury requiring IV hydration and nephrology evaluation.  She may require renal biopsy for diagnosis. Anticipated discharge home in 1 to 2 days once renal functions improved.  Consultants:   Nephrology  Procedures:  None  Antimicrobials: None.  Subjective: Patient was seen and examined at bedside.  Overnight events noted. Patient reports feeling better blood pressure is still slightly elevated.   She is anxious with increasing serum creatinine again.  Objective: Vitals:   03/10/21 1416 03/10/21 2212 03/11/21 0517 03/11/21 0905  BP: (!) 141/66 131/64 (!) 155/82 (!) 160/76  Pulse: 70 69 69 67  Resp:  18 18   Temp: 97.6 F (36.4 C) 98.1 F (36.7 C) 97.7 F (36.5 C)   TempSrc: Oral Oral Oral   SpO2: 98% 95% 98%   Weight:      Height:        Intake/Output Summary (Last 24 hours) at 03/11/2021 1220 Last data filed at 03/10/2021 1808 Gross per 24 hour  Intake 480 ml  Output --  Net 480 ml   Filed Weights   03/06/21 1008 03/06/21 1531  Weight: 75.5 kg 76.2 kg    Examination:  General exam: Appears comfortable, anxious, not in any acute distress. Respiratory system: Clear  to auscultation bilaterally. Respiratory effort normal.  RR 13 Cardiovascular system: S1-S2 heard, irregular rhythm, no  murmur. Gastrointestinal system: Abdomen is soft, nontender, nondistended, BS+ Central nervous system: Alert and oriented X 2. No focal neurological deficits. Extremities: +Edema, no cyanosis, no clubbing. Skin: No rashes, lesions or ulcers Psychiatry: Judgement and insight appear normal. Mood & affect appropriate.     Data Reviewed: I have personally reviewed following labs and imaging studies  CBC: Recent Labs  Lab 03/05/21 0959 03/07/21 0707 03/07/21 0937 03/08/21 0556 03/09/21 0513 03/10/21 0437 03/11/21 0629  WBC 5.3 4.8  --  4.6 4.8 5.2 4.1  NEUTROABS 3.8  --   --   --   --   --   --   HGB 8.4* 7.0* 7.3* 7.1* 7.4* 8.2* 9.1*  HCT 26.4* 22.6* 24.0* 22.7* 24.0* 26.2* 28.4*  MCV 88.6 93.8  --  93.0 92.0 92.3 91.3  PLT 258 226  --  221 211 236 846   Basic Metabolic Panel: Recent Labs  Lab 03/06/21 1044 03/07/21 0707 03/07/21 0937 03/08/21 0556 03/08/21 1015 03/09/21 0513 03/10/21 0437 03/11/21 0629  NA  --  140  --  139  --  139 138 134*  K  --  5.1   < > 5.2* 4.8 5.1 5.0 5.1  CL  --  109  --  109  --  109 110 106  CO2  --  19*  --  20*  --  20* 20* 18*  GLUCOSE  --  85  --  84  --  80 89 149*  BUN  --  59*  --  60*  --  55* 56* 63*  CREATININE  --  4.85*  --  5.02*  --  4.78* 5.10* 5.20*  CALCIUM  --  8.1*  --  8.1*  --  8.2* 8.2* 8.7*  MG 1.4*  --   --  1.3*  --  1.7 1.6* 1.9  PHOS 5.1*  --   --  4.9*   4.9*  --  4.7* 5.2*   5.3* 5.1*   5.1*   < > = values in this interval not displayed.   GFR: Estimated Creatinine Clearance: 10.5 mL/min (A) (by C-G formula based on SCr of 5.2 mg/dL (H)). Liver Function Tests: Recent Labs  Lab 03/05/21 0959 03/08/21 0556 03/09/21 0513 03/10/21 0437 03/11/21 0629  AST 14*  --   --   --   --   ALT 9  --   --   --   --   ALKPHOS 63  --   --   --   --   BILITOT 0.6  --   --   --   --   PROT 7.8  --   --   --   --   ALBUMIN 3.4* 2.9* 2.8* 2.9* 3.0*   No results for input(s): LIPASE, AMYLASE in the last 168 hours. No  results for input(s): AMMONIA in the last 168 hours. Coagulation Profile: No results for input(s): INR, PROTIME in the last 168 hours. Cardiac Enzymes: Recent Labs  Lab 03/06/21 1044  CKTOTAL 38   BNP (last 3 results) No results for input(s): PROBNP in the last 8760 hours. HbA1C: No results for input(s): HGBA1C in the last 72 hours. CBG: No results for input(s): GLUCAP in the last 168 hours. Lipid Profile: No results for input(s): CHOL, HDL, LDLCALC, TRIG, CHOLHDL, LDLDIRECT in the last 72 hours. Thyroid Function Tests: No results for input(s): TSH,  T4TOTAL, FREET4, T3FREE, THYROIDAB in the last 72 hours. Anemia Panel: No results for input(s): VITAMINB12, FOLATE, FERRITIN, TIBC, IRON, RETICCTPCT in the last 72 hours.  Sepsis Labs: No results for input(s): PROCALCITON, LATICACIDVEN in the last 168 hours.  Recent Results (from the past 240 hour(s))  Urine Culture     Status: None   Collection Time: 03/06/21 10:44 AM   Specimen: Urine, Clean Catch  Result Value Ref Range Status   Specimen Description   Final    URINE, CLEAN CATCH Performed at [facility], [address] [place], [place] [address]    Special Requests   Final    NONE Performed at [facility], [address], [place], [place] [address]    Culture   Final    NO GROWTH Performed at [facility], [place] [address] [place], [place] [address]    Report Status 03/08/2021 FINAL  Final  Resp Panel by RT-PCR (Flu A&B, Covid) Nasopharyngeal Swab     Status: None   Collection Time: 03/06/21  1:11 PM   Specimen: Nasopharyngeal Swab; Nasopharyngeal(NP) swabs in vial transport medium  Result Value Ref Range Status   SARS Coronavirus 2 by RT PCR NEGATIVE NEGATIVE Final    Comment: (NOTE) SARS-CoV-2 target nucleic acids are NOT DETECTED.  The SARS-CoV-2 RNA is generally detectable in upper respiratory specimens during the acute phase of infection. The lowest concentration of SARS-CoV-2 viral copies this assay can  detect is 138 copies/mL. A negative result does not preclude SARS-Cov-2 infection and should not be used as the sole basis for treatment or other patient management decisions. A negative result may occur with  improper specimen collection/handling, submission of specimen other than nasopharyngeal swab, presence of viral mutation(s) within the areas targeted by this assay, and inadequate number of viral copies(<138 copies/mL). A negative result must be combined with clinical observations, patient history, and epidemiological information. The expected result is Negative.  Fact Sheet for Patients:  EntrepreneurPulse.com.au  Fact Sheet for Healthcare Providers:  IncredibleEmployment.be  This test is no t yet approved or cleared by the [place] FDA and  has been authorized for detection and/or diagnosis of SARS-CoV-2 by FDA under an Emergency Use Authorization (EUA). This EUA will remain  in effect (meaning this test can be used) for the duration of the COVID-19 declaration under Section 564(b)(1) of the Act, 21 U.S.C.section 360bbb-3(b)(1), unless the authorization is terminated  or revoked sooner.       Influenza A by PCR NEGATIVE NEGATIVE Final   Influenza B by PCR NEGATIVE NEGATIVE Final    Comment: (NOTE) The Xpert Xpress SARS-CoV-2/FLU/RSV plus assay is intended as an aid in the diagnosis of influenza from Nasopharyngeal swab specimens and should not be used as a sole basis for treatment. Nasal washings and aspirates are unacceptable for Xpert Xpress SARS-CoV-2/FLU/RSV testing.  Fact Sheet for Patients: EntrepreneurPulse.com.au  Fact Sheet for Healthcare Providers: IncredibleEmployment.be  This test is not yet approved or cleared by the [place] FDA and has been authorized for detection and/or diagnosis of SARS-CoV-2 by FDA under an Emergency Use Authorization (EUA). This EUA will remain in  effect (meaning this test can be used) for the duration of the COVID-19 declaration under Section 564(b)(1) of the Act, 21 U.S.C. section 360bbb-3(b)(1), unless the authorization is terminated or revoked.  Performed at [facility], [address] [place], [place] [address]     Radiology Studies: No results found.  Scheduled Meds:  sodium chloride   Intravenous Once  amLODipine  10 mg Oral Daily   atorvastatin  40 mg Oral q1800   calcium-vitamin D  1 tablet Oral BID   carvedilol  12.5 mg Oral BID WC   famotidine  20 mg Oral Daily   ferrous sulfate  325 mg Oral Daily   pantoprazole  40 mg Oral Daily   Continuous Infusions:  methylPREDNISolone (SOLU-MEDROL) injection 500 mg (03/10/21 1808)     LOS: 5 days    Time spent: [place], MD [facility]   If 7PM-7AM, please contact night-coverage

## 2021-03-11 NOTE — Progress Notes (Addendum)
IR procedure request - random renal biopsy  71 y.o. Female inpatient. History of CHF, a fib (on eliquis), GERD, HLD. Presented to the ED at [facility] on 1.17.23 with abnormal labs.  Patient found to be in AKI.  US renal from 1.17.23. Team is requesting a kidney biopsy for further evaluation of etiology. Sodium 134, BUN 63, Cr 5.2, Phosphorus 5.1, GFR< 8. Last dose of eliquis given on 1.20.23 @ 2131. Per guidelines eliquis should be 4 doses if CrCl> 50.   IR consulted for possible kidney biopsy. Case has been reviewed and procedure approved by Dr. Kathlene Cote.  Patient tentatively scheduled for 1.24.23  Team instructed to: Keep Patient to be NPO after midnight on 1.24.23 Continue to hold eliquis  IR to advise Team to coordinate logistics with [facility].  Should patient be hemodynamically stable enough for discharge procedure can be scheduled as outpatient. Team made aware via [employer].

## 2021-03-11 NOTE — Progress Notes (Signed)
[facility] Progress Note  Name: Mackenzie [name] MRN: [id number] DOB: [birth year]   Subjective: Doing fine - no issues with IV steroids last night.  Anxious about biopsy but agreeable.  Doesn't like renal diet.  No UOP documented but says normal volume.    Review of systems:  Denies n/v Denies shortness of breath or chest pain     ------------- Background on consult:  Mackenzie [name] is an 71 y.o. female with a PMH significant for chronic systolic and diastolic CHF, atrial fibrillation, HLD, iron deficiency anemia, GERD, HTN, who was instructed by her Hematologist to go to the hospital due to abnormal labs.  On 03/05/21 she had labs drawn that were notable for BUN 62, Cr 4.94, Hgb 8.4.  In the ED, she was completely asymptomatic.  Afebrile and vital signs were stable.  She was admitted and started on IVF's.  We were consulted to further evaluate her AKI, however it appears to be more subacute as her Scr started to climb at the end of November as seen below.  She was admitted with symptomatic anemia at that time and underwent EGD and colonoscopy.  Since discharge her Scr continued to climb.   She denies any N/V/D, dysuria, pyuria, hematuria, urgency, frequency, or retention.  No lower extremity edema, NSAIDs, recent IV contrast, or new medications or changes to her diuretics recently. She had been on Entresto and spironalactone, but those were held upon admission   Intake/Output Summary (Last 24 hours) at 03/11/2021 1251 Last data filed at 03/10/2021 1808 Gross per 24 hour  Intake 480 ml  Output --  Net 480 ml     Vitals:  Vitals:   03/10/21 1416 03/10/21 2212 03/11/21 0517 03/11/21 0905  BP: (!) 141/66 131/64 (!) 155/82 (!) 160/76  Pulse: 70 69 69 67  Resp:  18 18   Temp: 97.6 F (36.4 C) 98.1 F (36.7 C) 97.7 F (36.5 C)   TempSrc: Oral Oral Oral   SpO2: 98% 95% 98%   Weight:      Height:         Physical Exam:   General adult female in bed in no acute  distress HEENT normocephalic atraumatic extraocular movements intact sclera anicteric Neck supple trachea midline Lungs clear to auscultation bilaterally normal work of breathing at rest on room air  Heart S1S2 no rub Abdomen soft nontender nondistended Extremities no edema appreciated Psych normal mood and affect Neuro alert and oriented x 3 provides hx and follows commands  Skin no rashes  Medications reviewed   Labs:  BMP Latest Ref Rng & Units 03/11/2021 03/10/2021 03/09/2021  Glucose 70 - 99 mg/dL 149(H) 89 80  BUN 8 - 23 mg/dL 63(H) 56(H) 55(H)  Creatinine 0.44 - 1.00 mg/dL 5.20(H) 5.10(H) 4.78(H)  Sodium 135 - 145 mmol/L 134(L) 138 139  Potassium 3.5 - 5.1 mmol/L 5.1 5.0 5.1  Chloride 98 - 111 mmol/L 106 110 109  CO2 22 - 32 mmol/L 18(L) 20(L) 20(L)  Calcium 8.9 - 10.3 mg/dL 8.7(L) 8.2(L) 8.2(L)     Assessment/Plan:   Assessment/Plan:  AKI vs subacute - Scr started rising at the end of November and has continued to rise.  She is completely asymptomatic and no inciting event.  Pre-renal insults with entresto and spironolactone.  UA with some leukocytes and > 50 RBC. UP/C 0.41.  [place] without obstruction but increased echogenicity.  No history of stones.   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  Start H2 blocker and Ca+D with steroids.  Check G6PDH for PJP prophylaxis planning. Renal biopsy - will need to be a [facility] (she wishes to go down/back for just biopsy if possible), timing per IR based on last eliquis 1/20 PM.  She is taking normal PO now so can hold on fluids; resume PRN Continue to hold entresto and aldactone for now  No acute indication for dialysis  Cont to follow strict I/Os Follow post-void residual bladder scan and place foley if retains over 250  mL urine  Chronic diastolic and systolic CHF - note entresto and aldactone on hold.  Hold IVF today - volume status looks ok; can give diuetics PRN. A fib - per primary team.  She is on eliquis and this is on hold for renal biopsy.  Last dose was 1/20 PM. HTN - remains HTN, ^ amlodipine to 10.  Iron deficiency anemia - followed by hematology. S/p PRBC's on 1/19. May be down a bit due to fluids. Watch for need for additional transfusion. GI work-up  Hypomag - s/p repletion Elevated serum free light chains ratio - noted.  Up/C 0.41. I see a myeloma panel is pending still.   Long bedside discussion with 2 family members yesterday. D/w 1 daughter bedside again today --> she has a MD appt at 9am tomorrow and if she misses MD she'd like an update with anything important; I told her probably there won't be any updates tomorrow, just awaiting biopsy.  Nephrology will follow, call with concerns.   Justin Mend, MD 03/11/2021  12:51 PM

## 2021-03-12 ENCOUNTER — Inpatient Hospital Stay (HOSPITAL_COMMUNITY): Payer: Medicare Other

## 2021-03-12 LAB — MULTIPLE MYELOMA PANEL, SERUM
Albumin SerPl Elph-Mcnc: 2.8 g/dL — ABNORMAL LOW (ref 2.9–4.4)
Albumin/Glob SerPl: 0.7 (ref 0.7–1.7)
Alpha 1: 0.3 g/dL (ref 0.0–0.4)
Alpha2 Glob SerPl Elph-Mcnc: 0.9 g/dL (ref 0.4–1.0)
B-Globulin SerPl Elph-Mcnc: 1 g/dL (ref 0.7–1.3)
Gamma Glob SerPl Elph-Mcnc: 1.9 g/dL — ABNORMAL HIGH (ref 0.4–1.8)
Globulin, Total: 4.1 g/dL — ABNORMAL HIGH (ref 2.2–3.9)
IgA: 237 mg/dL (ref 87–352)
IgG (Immunoglobin G), Serum: 1993 mg/dL — ABNORMAL HIGH (ref 586–1602)
IgM (Immunoglobulin M), Srm: 159 mg/dL (ref 26–217)
Total Protein ELP: 6.9 g/dL (ref 6.0–8.5)

## 2021-03-12 LAB — CBC
HCT: 28.3 % — ABNORMAL LOW (ref 36.0–46.0)
HCT: 27.5 % — ABNORMAL LOW (ref 36.0–46.0)
Hemoglobin: 8.9 g/dL — ABNORMAL LOW (ref 12.0–15.0)
Hemoglobin: 8.9 g/dL — ABNORMAL LOW (ref 12.0–15.0)
MCH: 28.3 pg (ref 26.0–34.0)
MCH: 29.6 pg (ref 26.0–34.0)
MCHC: 31.4 g/dL (ref 30.0–36.0)
MCHC: 32.4 g/dL (ref 30.0–36.0)
MCV: 90.1 fL (ref 80.0–100.0)
MCV: 91.4 fL (ref 80.0–100.0)
Platelets: 261 K/uL (ref 150–400)
Platelets: 249 10*3/uL (ref 150–400)
RBC: 3.14 MIL/uL — ABNORMAL LOW (ref 3.87–5.11)
RBC: 3.01 MIL/uL — ABNORMAL LOW (ref 3.87–5.11)
RDW: 15.7 % — ABNORMAL HIGH (ref 11.5–15.5)
RDW: 15.9 % — ABNORMAL HIGH (ref 11.5–15.5)
WBC: 6 K/uL (ref 4.0–10.5)
WBC: 6.2 10*3/uL (ref 4.0–10.5)
nRBC: 0 % (ref 0.0–0.2)
nRBC: 0 % (ref 0.0–0.2)

## 2021-03-12 LAB — BASIC METABOLIC PANEL WITH GFR
Anion gap: 12 (ref 5–15)
BUN: 73 mg/dL — ABNORMAL HIGH (ref 8–23)
CO2: 15 mmol/L — ABNORMAL LOW (ref 22–32)
Calcium: 8.7 mg/dL — ABNORMAL LOW (ref 8.9–10.3)
Chloride: 103 mmol/L (ref 98–111)
Creatinine, Ser: 5.14 mg/dL — ABNORMAL HIGH (ref 0.44–1.00)
GFR, Estimated: 8 mL/min — ABNORMAL LOW
Glucose, Bld: 143 mg/dL — ABNORMAL HIGH (ref 70–99)
Potassium: 5.6 mmol/L — ABNORMAL HIGH (ref 3.5–5.1)
Sodium: 130 mmol/L — ABNORMAL LOW (ref 135–145)

## 2021-03-12 LAB — RENAL FUNCTION PANEL
Albumin: 3 g/dL — ABNORMAL LOW (ref 3.5–5.0)
Anion gap: 12 (ref 5–15)
BUN: 73 mg/dL — ABNORMAL HIGH (ref 8–23)
CO2: 15 mmol/L — ABNORMAL LOW (ref 22–32)
Calcium: 8.5 mg/dL — ABNORMAL LOW (ref 8.9–10.3)
Chloride: 103 mmol/L (ref 98–111)
Creatinine, Ser: 5.12 mg/dL — ABNORMAL HIGH (ref 0.44–1.00)
GFR, Estimated: 9 mL/min — ABNORMAL LOW (ref >60)
Glucose, Bld: 145 mg/dL — ABNORMAL HIGH (ref 70–99)
Phosphorus: 5.5 mg/dL — ABNORMAL HIGH (ref 2.5–4.6)
Potassium: 5.8 mmol/L — ABNORMAL HIGH (ref 3.5–5.1)
Sodium: 130 mmol/L — ABNORMAL LOW (ref 135–145)

## 2021-03-12 LAB — POTASSIUM: Potassium: 5.2 mmol/L — ABNORMAL HIGH (ref 3.5–5.1)

## 2021-03-12 LAB — MAGNESIUM: Magnesium: 2 mg/dL (ref 1.7–2.4)

## 2021-03-12 LAB — PHOSPHORUS: Phosphorus: 5.6 mg/dL — ABNORMAL HIGH (ref 2.5–4.6)

## 2021-03-12 LAB — PROTIME-INR
INR: 1.2 (ref 0.8–1.2)
Prothrombin Time: 14.8 s (ref 11.4–15.2)

## 2021-03-12 LAB — HEPATITIS B SURFACE ANTIBODY, QUANTITATIVE: Hep B S AB Quant (Post): <3.1 m[IU]/mL — ABNORMAL LOW

## 2021-03-12 MED ORDER — SODIUM ZIRCONIUM CYCLOSILICATE 10 G PO PACK
10.0000 g | PACK | Freq: Once | ORAL | Status: AC
  Administered 2021-03-12: 10 g via ORAL
  Filled 2021-03-12: qty 1

## 2021-03-12 NOTE — TOC Initial Note (Signed)
Transition of Care [facility]) - Initial/Assessment Note    Patient Details  Name: Mackenzie [name] MRN: [id number] Date of Birth: 04-23-1950  Transition of Care [facility]) CM/SW Contact:    Iona Beard, [place] Phone Number: 03/12/2021, 1:49 PM  Clinical Narrative:                 TOC noted that pt is high risk for readmission. CSW spoke with pts daughter who was in room with pt to complete assessment. Pt lives with one of her daughters. Pt is independent in completing her ADLs. Pt can drive if needed. Pt has had HH but daughter states this was in the 76's. Pt does not use any DME in the home. TOC to follow for possible D/C needs.   Expected Discharge Plan: Home/Self Care Barriers to Discharge: Continued Medical Work up   Patient Goals and CMS Choice Patient states their goals for this hospitalization and ongoing recovery are:: Return home CMS Medicare.gov Compare Post Acute Care list provided to:: Patient Choice offered to / list presented to : Adult Children  Expected Discharge Plan and Services Expected Discharge Plan: Home/Self Care In-house Referral: Clinical Social Work Discharge Planning Services: CM Consult   Living arrangements for the past 2 months: Single Family Home                                      Prior Living Arrangements/Services Living arrangements for the past 2 months: Single Family Home Lives with:: Adult Children Patient language and need for interpreter reviewed:: Yes Do you feel safe going back to the place where you live?: Yes      Need for Family Participation in Patient Care: Yes (Comment) Care giver support system in place?: Yes (comment)   Criminal Activity/Legal Involvement Pertinent to Current Situation/Hospitalization: No - Comment as needed  Activities of Daily Living Home Assistive Devices/Equipment: None ADL Screening (condition at time of admission) Patient's cognitive ability adequate to safely complete daily activities?: Yes Is the  patient deaf or have difficulty hearing?: No Does the patient have difficulty seeing, even when wearing glasses/contacts?: No Does the patient have difficulty concentrating, remembering, or making decisions?: No Patient able to express need for assistance with ADLs?: Yes Does the patient have difficulty dressing or bathing?: No Independently performs ADLs?: Yes (appropriate for developmental age) Does the patient have difficulty walking or climbing stairs?: No Weakness of Legs: None Weakness of Arms/Hands: None  Permission Sought/Granted                  Emotional Assessment Appearance:: Appears stated age Attitude/Demeanor/Rapport: Engaged Affect (typically observed): Accepting Orientation: : Oriented to Self, Oriented to Place, Oriented to  Time, Oriented to Situation Alcohol / Substance Use: Not Applicable Psych Involvement: No (comment)  Admission diagnosis:  Acute on chronic renal failure (HCC) [N17.9, N18.9] Acute renal failure, unspecified acute renal failure type [facility]) [N17.9] Patient Active Problem List   Diagnosis Date Noted   Acute on chronic renal failure ([place]) 03/06/2021   Acute renal failure superimposed on stage 3a chronic kidney disease ([place]) 03/06/2021   Melena    Dark stools    Symptomatic anemia 01/15/2021   Syncope 01/15/2021   Influenza A 01/15/2021   Atrial fibrillation ([place]) 01/15/2021   Pneumonia due to influenza 42/59/5638   Chronic systolic heart failure ([place]) 05/14/2016   HTN (hypertension) 05/14/2016   Snoring 05/14/2016   PCP:  [name], MD Pharmacy:   [facility] [facility], [place] [address] [place] [address] Phone: [phone] Fax: [phone]  [facility] [phone] - [place], [place] - [address] [place] [address] Phone: [phone] Fax: [phone]     Social Determinants of Health (SDOH)  Interventions    Readmission Risk Interventions No flowsheet data found.

## 2021-03-12 NOTE — Progress Notes (Addendum)
Patient ID: Mackenzie [name], female   DOB: [birth year], 71 y.o.   MRN: [id number]   Pt Is scheduled for random renal biopsy at [facility] 1/24  See orders for 1/24 procedure To be at [facility] by 730-8 am 1/24 via ambulance  Pt will return to [facility] after procedure  I have spoken to RN

## 2021-03-12 NOTE — Progress Notes (Signed)
[facility] Progress Note  Name: Mackenzie [name] MRN: [id number] DOB: 03/30/1950   Subjective:  No complaints, ready for biopsy tomorrow Creatinine stable at 5.1, potassium 5.6, albumin 3.0, bicarbonate 15 UOP not documented, Third and final dose of Solu-Medrol scheduled this afternoon  ------------- Background on consult:  Mackenzie [name] is an 71 y.o. female with a PMH significant for chronic systolic and diastolic CHF, atrial fibrillation, HLD, iron deficiency anemia, GERD, HTN, who was instructed by her Hematologist to go to the hospital due to abnormal labs.  On 03/05/21 she had labs drawn that were notable for BUN 62, Cr 4.94, Hgb 8.4.  In the ED, she was completely asymptomatic.  Afebrile and vital signs were stable.  She was admitted and started on IVF's.  We were consulted to further evaluate her AKI, however it appears to be more subacute as her Scr started to climb at the end of November as seen below.  She was admitted with symptomatic anemia at that time and underwent EGD and colonoscopy.  Since discharge her Scr continued to climb.   She denies any N/V/D, dysuria, pyuria, hematuria, urgency, frequency, or retention.  No lower extremity edema, NSAIDs, recent IV contrast, or new medications or changes to her diuretics recently. She had been on Entresto and spironalactone, but those were held upon admission   Intake/Output Summary (Last 24 hours) at 03/12/2021 1023 Last data filed at 03/11/2021 2120 Gross per 24 hour  Intake 1630 ml  Output --  Net 1630 ml     Vitals:  Vitals:   03/11/21 0905 03/11/21 1401 03/11/21 2108 03/12/21 0526  BP: (!) 160/76 134/65 125/62 135/69  Pulse: 67 65 75 70  Resp:  18 18 18   Temp:  97.6 F (36.4 C) 97.9 F (36.6 C) 97.9 F (36.6 C)  TempSrc:  Oral Oral Oral  SpO2:  100% 99% 98%  Weight:      Height:         Physical Exam:   General adult female in bed in no acute distress HEENT normocephalic atraumatic extraocular  movements intact sclera anicteric Neck supple trachea midline Lungs clear to auscultation bilaterally normal work of breathing at rest on room air  Heart S1S2 no rub Abdomen soft nontender nondistended Extremities no edema appreciated Psych normal mood and affect Neuro alert and oriented x 3 provides hx and follows commands  Skin no rashes  Medications reviewed   Labs:  BMP Latest Ref Rng & Units 03/12/2021 03/12/2021 03/11/2021  Glucose 70 - 99 mg/dL 143(H) 145(H) 149(H)  BUN 8 - 23 mg/dL 73(H) 73(H) 63(H)  Creatinine 0.44 - 1.00 mg/dL 5.14(H) 5.12(H) 5.20(H)  Sodium 135 - 145 mmol/L 130(L) 130(L) 134(L)  Potassium 3.5 - 5.1 mmol/L 5.6(H) 5.8(H) 5.1  Chloride 98 - 111 mmol/L 103 103 106  CO2 22 - 32 mmol/L 15(L) 15(L) 18(L)  Calcium 8.9 - 10.3 mg/dL 8.7(L) 8.5(L) 8.7(L)     Assessment/Plan:   Assessment/Plan:  AKI vs subacute renal failure- Scr started rising at the end of November and has continued to rise.  She is completely asymptomatic and no inciting event.  Pre-renal insults with entresto and spironolactone.  UA with some leukocytes and > 50 RBC. UP/C 0.41.  [place] without obstruction but increased echogenicity.  No history of stones.   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  Start H2 blocker and Ca+D with steroids.  Check G6PDH for PJP prophylaxis planning. Renal biopsy - to be a [facility] 1/24; paper biopsy form in paper chart She is taking normal PO now so can hold on fluids; resume PRN Continue to hold entresto and aldactone for now  No acute indication for dialysis  Cont to follow strict I/Os   Chronic diastolic and systolic CHF - note entresto and aldactone on hold.  Hold IVF today - volume status looks ok; can give diuetics PRN. A fib - per primary team.   She is on eliquis and this is on hold for renal biopsy.  Last dose was 1/20 PM. HTN -BP is currently stable Iron deficiency anemia - followed by hematology. S/p PRBC's on 1/19.  Hemoglobin stable posttransfusion Hypomag - s/p repletion, stable Elevated serum free light chains ratio - noted.  Up/C 0.41. I see a myeloma panel is pending still.   Rexene Agent, MD 03/12/2021  10:23 AM

## 2021-03-12 NOTE — Progress Notes (Signed)
PROGRESS NOTE    Mackenzie [name]  PRX:[id number] DOB: Mar 04, 1950 DOA: 03/06/2021 PCP: [name], MD   Brief Narrative:  This 70 years old female with PMH significant for systolic and diastolic CHF, hyperlipidemia, iron deficiency anemia, atrial fibrillation, GERD presented in the ED with abnormal labs.  Patient has been following up with her hematologist ,  routine work-up was obtained and showed serum creatinine 4.94 . Patient was advised by hematologist to get admitted for acute kidney injury. Patient is admitted for acute kidney injury, nephrology is consulted,  continued on IV hydration.  She is not a candidate for hemodialysis but patient will require renal biopsy.  She is scheduled to have random renal biopsy at [facility] 1/24.  Assessment & Plan:   Principal Problem:   Acute on chronic renal failure (HCC) Active Problems:   Chronic systolic heart failure (HCC)   HTN (hypertension)   Acute renal failure superimposed on stage 3a chronic kidney disease (HCC)  Acute kidney injury on chronic kidney disease 3A: Baseline serum creatinine 1.0-1.3. Creatinine on presentation 4.94 >5.02>4.78>5.10>5.20 Etiology unclear, suspect a degree of volume depletion. Renal ultrasound: No hydronephrosis, increased renal echogenicity. Nephrology consulted,  recommended to continue IV hydration. If serum creatinine does not improve may require renal biopsy for glomerulonephritis work-up.   C-ANCA: Neg, anti DNA: Neg, ASO: Neg, AntiGBM, Neg, ANA : Neg. 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 Continue to monitor serum creatinine, showed some improvement. 4.78 No acute indication for hemodialysis. Eliquis discontinued.   Scheduled to have renal biopsy at [facility] on 1/24.  Chronic diastolic and systolic CHF: Clinically she appears euvolemic. Last echocardiogram 12/27/2020 shows LVEF 60  to 65%,no regional wall motion abnormalities. Continue to hold Entresto, spironolactone, continue carvedilol.  Atrial fibrillation: Heart rate controlled,  Continue carvedilol and Eliquis. Nephrology suggests discontinuing Eliquis in anticipation for renal biopsy next week. Discontinue Eliquis 03/10/21.  Hyperlipidemia: Continue statins.  Essential hypertension: Continue carvedilol.  Iron deficiency anemia: Continue ferrous sulfate.  Hemoglobin slightly dropped to 7.1.   S/p 1 unit PRBC> Hb 7.4 >8.2 >9.2 Continue to monitor H&H.  DVT prophylaxis: SCDs, Eliquis on hold. Code Status: Full code. Family Communication: Daughter at bed side. Disposition Plan:   Status is: Inpatient  Remains inpatient appropriate because:  Admitted for acute kidney injury requiring IV hydration and nephrology evaluation.  She may require renal biopsy for diagnosis.  She is scheduled to have random renal biopsy on 03/13/2021 at [facility].  Consultants:   Nephrology  Procedures:  None  Antimicrobials: None.  Subjective: Patient was seen and examined at bedside.  Overnight events noted. Patient reports feeling better.  She denies any symptoms. She is anxious about going to [facility] for biopsy.  Objective: Vitals:   03/11/21 0905 03/11/21 1401 03/11/21 2108 03/12/21 0526  BP: (!) 160/76 134/65 125/62 135/69  Pulse: 67 65 75 70  Resp:  $Remo'18 18 18  'jjZMX$ Temp:  97.6 F (36.4 C) 97.9 F (36.6 C) 97.9 F (36.6 C)  TempSrc:  Oral Oral Oral  SpO2:  100% 99% 98%  Weight:      Height:        Intake/Output Summary (Last 24 hours) at 03/12/2021 1335 Last data filed at 03/11/2021 2120 Gross per 24 hour  Intake 950 ml  Output --  Net 950 ml   Filed Weights   03/06/21 1008 03/06/21 1531  Weight: 75.5 kg  76.2 kg    Examination:  General exam: Appears comfortable, anxious, not in any acute distress. Respiratory system: Clear to auscultation bilaterally. Respiratory effort normal.  RR  16 Cardiovascular system: S1-S2 heard, regular rate, irregular rhythm, no murmur. Gastrointestinal system: Abdomen is soft, nontender, nondistended, BS+ Central nervous system: Alert and oriented X 2. No focal neurological deficits. Extremities: +Edema, no cyanosis, no clubbing. Skin: No rashes, lesions or ulcers Psychiatry: Judgement and insight appear normal. Mood & affect appropriate.     Data Reviewed: I have personally reviewed following labs and imaging studies  CBC: Recent Labs  Lab 03/08/21 0556 03/09/21 0513 03/10/21 0437 03/11/21 0629 03/12/21 0851  WBC 4.6 4.8 5.2 4.1 6.0  HGB 7.1* 7.4* 8.2* 9.1* 8.9*  HCT 22.7* 24.0* 26.2* 28.4* 28.3*  MCV 93.0 92.0 92.3 91.3 90.1  PLT 221 211 236 259 166   Basic Metabolic Panel: Recent Labs  Lab 03/08/21 0556 03/08/21 1015 03/09/21 0513 03/10/21 0437 03/11/21 0629 03/12/21 0440  NA 139  --  139 138 134* 130*   130*  K 5.2* 4.8 5.1 5.0 5.1 5.6*   5.8*  CL 109  --  109 110 106 103   103  CO2 20*  --  20* 20* 18* 15*   15*  GLUCOSE 84  --  80 89 149* 143*   145*  BUN 60*  --  55* 56* 63* 73*   73*  CREATININE 5.02*  --  4.78* 5.10* 5.20* 5.14*   5.12*  CALCIUM 8.1*  --  8.2* 8.2* 8.7* 8.7*   8.5*  MG 1.3*  --  1.7 1.6* 1.9 2.0  PHOS 4.9*   4.9*  --  4.7* 5.2*   5.3* 5.1*   5.1* 5.6*   5.5*   GFR: Estimated Creatinine Clearance: 10.7 mL/min (A) (by C-G formula based on SCr of 5.12 mg/dL (H)). Liver Function Tests: Recent Labs  Lab 03/08/21 0556 03/09/21 0513 03/10/21 0437 03/11/21 0629 03/12/21 0440  ALBUMIN 2.9* 2.8* 2.9* 3.0* 3.0*   No results for input(s): LIPASE, AMYLASE in the last 168 hours. No results for input(s): AMMONIA in the last 168 hours. Coagulation Profile: No results for input(s): INR, PROTIME in the last 168 hours. Cardiac Enzymes: Recent Labs  Lab 03/06/21 1044  CKTOTAL 38   BNP (last 3 results) No results for input(s): PROBNP in the last 8760 hours. HbA1C: No results for input(s):  HGBA1C in the last 72 hours. CBG: No results for input(s): GLUCAP in the last 168 hours. Lipid Profile: No results for input(s): CHOL, HDL, LDLCALC, TRIG, CHOLHDL, LDLDIRECT in the last 72 hours. Thyroid Function Tests: No results for input(s): TSH, T4TOTAL, FREET4, T3FREE, THYROIDAB in the last 72 hours. Anemia Panel: No results for input(s): VITAMINB12, FOLATE, FERRITIN, TIBC, IRON, RETICCTPCT in the last 72 hours.  Sepsis Labs: No results for input(s): PROCALCITON, LATICACIDVEN in the last 168 hours.  Recent Results (from the past 240 hour(s))  Urine Culture     Status: None   Collection Time: 03/06/21 10:44 AM   Specimen: Urine, Clean Catch  Result Value Ref Range Status   Specimen Description   Final    URINE, CLEAN CATCH Performed at [facility], [address], [place], [place] [address]    Special Requests   Final    NONE Performed at [facility], [address] [place], [place] [address]    Culture   Final    NO GROWTH Performed at [facility], [place]  [address], [place], [place] [address]    Report Status 03/08/2021 FINAL  Final  Resp Panel by RT-PCR (Flu A&B, Covid) Nasopharyngeal Swab     Status: None   Collection Time: 03/06/21  1:11 PM   Specimen: Nasopharyngeal Swab; Nasopharyngeal(NP) swabs in vial transport medium  Result Value Ref Range Status   SARS Coronavirus 2 by RT PCR NEGATIVE NEGATIVE Final    Comment: (NOTE) SARS-CoV-2 target nucleic acids are NOT DETECTED.  The SARS-CoV-2 RNA is generally detectable in upper respiratory specimens during the acute phase of infection. The lowest concentration of SARS-CoV-2 viral copies this assay can detect is 138 copies/mL. A negative result does not preclude SARS-Cov-2 infection and should not be used as the sole basis for treatment or other patient management decisions. A negative result may occur with  improper specimen collection/handling, submission of specimen other than nasopharyngeal swab, presence  of viral mutation(s) within the areas targeted by this assay, and inadequate number of viral copies(<138 copies/mL). A negative result must be combined with clinical observations, patient history, and epidemiological information. The expected result is Negative.  Fact Sheet for Patients:  EntrepreneurPulse.com.au  Fact Sheet for Healthcare Providers:  IncredibleEmployment.be  This test is no t yet approved or cleared by the [place] FDA and  has been authorized for detection and/or diagnosis of SARS-CoV-2 by FDA under an Emergency Use Authorization (EUA). This EUA will remain  in effect (meaning this test can be used) for the duration of the COVID-19 declaration under Section 564(b)(1) of the Act, 21 U.S.C.section 360bbb-3(b)(1), unless the authorization is terminated  or revoked sooner.       Influenza A by PCR NEGATIVE NEGATIVE Final   Influenza B by PCR NEGATIVE NEGATIVE Final    Comment: (NOTE) The Xpert Xpress SARS-CoV-2/FLU/RSV plus assay is intended as an aid in the diagnosis of influenza from Nasopharyngeal swab specimens and should not be used as a sole basis for treatment. Nasal washings and aspirates are unacceptable for Xpert Xpress SARS-CoV-2/FLU/RSV testing.  Fact Sheet for Patients: EntrepreneurPulse.com.au  Fact Sheet for Healthcare Providers: IncredibleEmployment.be  This test is not yet approved or cleared by the [place] FDA and has been authorized for detection and/or diagnosis of SARS-CoV-2 by FDA under an Emergency Use Authorization (EUA). This EUA will remain in effect (meaning this test can be used) for the duration of the COVID-19 declaration under Section 564(b)(1) of the Act, 21 U.S.C. section 360bbb-3(b)(1), unless the authorization is terminated or revoked.  Performed at [facility], [address] [place], [place] [address]     Radiology Studies: No results  found.  Scheduled Meds:  sodium chloride   Intravenous Once   amLODipine  10 mg Oral Daily   atorvastatin  40 mg Oral q1800   calcium-vitamin D  1 tablet Oral BID   carvedilol  12.5 mg Oral BID WC   famotidine  20 mg Oral Daily   ferrous sulfate  325 mg Oral Daily   pantoprazole  40 mg Oral Daily   sodium zirconium cyclosilicate  10 g Oral Once   Continuous Infusions:  methylPREDNISolone (SOLU-MEDROL) injection Stopped (03/11/21 1800)     LOS: 6 days    Time spent: [place], MD [facility]   If 7PM-7AM, please contact night-coverage

## 2021-03-13 ENCOUNTER — Ambulatory Visit (HOSPITAL_COMMUNITY)
Admit: 2021-03-13 | Discharge: 2021-03-13 | Disposition: A | Discharge status: Home / Self Care | Payer: Medicare Other | Attending: Family Medicine | Admitting: Family Medicine

## 2021-03-13 ENCOUNTER — Encounter (HOSPITAL_COMMUNITY): Payer: Self-pay

## 2021-03-13 DIAGNOSIS — N179 Acute kidney failure, unspecified: Secondary | ICD-10-CM | POA: Insufficient documentation

## 2021-03-13 DIAGNOSIS — N051 Unspecified nephritic syndrome with focal and segmental glomerular lesions: Secondary | ICD-10-CM | POA: Diagnosis not present

## 2021-03-13 LAB — RENAL FUNCTION PANEL
Albumin: 3.1 g/dL — ABNORMAL LOW (ref 3.5–5.0)
Anion gap: 10 (ref 5–15)
BUN: 91 mg/dL — ABNORMAL HIGH (ref 8–23)
CO2: 16 mmol/L — ABNORMAL LOW (ref 22–32)
Calcium: 8.2 mg/dL — ABNORMAL LOW (ref 8.9–10.3)
Chloride: 101 mmol/L (ref 98–111)
Creatinine, Ser: 5.73 mg/dL — ABNORMAL HIGH (ref 0.44–1.00)
GFR, Estimated: 7 mL/min — ABNORMAL LOW (ref >60)
Glucose, Bld: 140 mg/dL — ABNORMAL HIGH (ref 70–99)
Phosphorus: 5.8 mg/dL — ABNORMAL HIGH (ref 2.5–4.6)
Potassium: 4.8 mmol/L (ref 3.5–5.1)
Sodium: 127 mmol/L — ABNORMAL LOW (ref 135–145)

## 2021-03-13 LAB — GLUCOSE 6 PHOSPHATE DEHYDROGENASE
G6PDH: 10.2 U/g{Hb} (ref 4.8–15.7)
Hemoglobin: 8.9 g/dL — ABNORMAL LOW (ref 11.1–15.9)

## 2021-03-13 MED ORDER — PREDNISONE 20 MG PO TABS
60.0000 mg | ORAL_TABLET | Freq: Every day | ORAL | Status: DC
  Administered 2021-03-13 – 2021-03-16 (×4): 60 mg via ORAL
  Filled 2021-03-13 (×4): qty 3

## 2021-03-13 MED ORDER — FENTANYL CITRATE (PF) 100 MCG/2ML IJ SOLN
INTRAMUSCULAR | Status: AC
  Filled 2021-03-13: qty 2

## 2021-03-13 MED ORDER — SODIUM BICARBONATE 650 MG PO TABS
1300.0000 mg | ORAL_TABLET | Freq: Three times a day (TID) | ORAL | Status: DC
  Administered 2021-03-13 – 2021-03-16 (×10): 1300 mg via ORAL
  Filled 2021-03-13 (×11): qty 2

## 2021-03-13 MED ORDER — GELATIN ABSORBABLE 12-7 MM EX MISC
CUTANEOUS | Status: AC
  Filled 2021-03-13: qty 1

## 2021-03-13 MED ORDER — FENTANYL CITRATE (PF) 100 MCG/2ML IJ SOLN
INTRAMUSCULAR | Status: DC | PRN
  Administered 2021-03-13: 25 ug via INTRAVENOUS

## 2021-03-13 MED ORDER — MIDAZOLAM HCL 2 MG/2ML IJ SOLN
INTRAMUSCULAR | Status: DC | PRN
  Administered 2021-03-13 (×2): .5 mg via INTRAVENOUS

## 2021-03-13 MED ORDER — MIDAZOLAM HCL 2 MG/2ML IJ SOLN
INTRAMUSCULAR | Status: AC
  Filled 2021-03-13: qty 2

## 2021-03-13 MED ORDER — LIDOCAINE HCL (PF) 1 % IJ SOLN
INTRAMUSCULAR | Status: AC
  Filled 2021-03-13: qty 30

## 2021-03-13 NOTE — Progress Notes (Signed)
Report given to [place]. Pt vitals stable.

## 2021-03-13 NOTE — Progress Notes (Signed)
Patient not in room. Currently at [facility] for renal biopsy. Unable to see/examine patient today. Finished stress dose steroids, will start prednisone 60mg  daily starting today. Labs reviewed, Cr up to 5.7. With persistent acidosis--will start nahco3 1300mg  TID. Further treatment contingent on biopsy results. Please call with any questions/concerns.  Gean Quint, MD [facility]

## 2021-03-13 NOTE — Procedures (Signed)
Interventional Radiology Procedure Note  Procedure: Ultrasound guided renal biopsy  Findings: Please refer to procedural dictation for full description. Right inferior pole, 16 ga cores x2.  Gelfoam needle track embolization.  Complications: None immediate  Estimated Blood Loss: < 5 mL  Recommendations: Strict 3 hour bedrest.  May transport back to [facility] prior to bedrest complete. Follow up Pathology results.   Ruthann Cancer, MD Pager: [phone]

## 2021-03-13 NOTE — Progress Notes (Signed)
Puncture site from kidney biopsy located on pts lower lateral abdomen right side. Band aid on puncture site, removed and assessed, no active bleeding noted, minimal blood noted on band aid. Pt reports pain level is 0 on a 0-10 pain scale.

## 2021-03-13 NOTE — Consult Note (Signed)
Chief Complaint: Patient was seen in consultation today for random renal biopsy at the request of [place]  Referring Physician(s): Dr Alfonso Ellis  Supervising Physician: Ruthann Cancer  Patient Status: [facility] IP  History of Present Illness: Mackenzie [name] is a 71 y.o. female   Hx CHF; HLD; IDA; Afib (Eliquis-- LD 1 week ago) Presented to ED after requested per MD secondary abnormal labs Cr 4.9 Nephrology consulted Asking for Random renal biopsy  Assessment/Plan:  AKI vs subacute renal failure- Scr started rising at the end of November and has continued to rise.  She is completely asymptomatic and no inciting event.  Pre-renal insults with entresto and spironolactone.  UA with some leukocytes and > 50 RBC. UP/C 0.41.  [place] without obstruction but increased echogenicity.  No history of stones.   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  Start H2 blocker and Ca+D with steroids.  Check G6PDH for PJP prophylaxis planning. Renal biopsy - to be a [facility] 1/24; paper biopsy form in paper chart She is taking normal PO now so can hold on fluids; resume PRN Continue to hold entresto and aldactone for now  No acute indication for dialysis  Cont to follow strict I/Os  Scheduled now for random renal biopsy   Past Medical History:  Diagnosis Date   Arthritis    Atrial fibrillation (HCC)    CHF (congestive heart failure) (HCC)    HLD (hyperlipidemia)    Hypertension     Past Surgical History:  Procedure Laterality Date   COLONOSCOPY WITH PROPOFOL N/A 01/19/2021   hemorrhoids on perianal exam, examined portion ofileum normal, entire examined colon, normal. non bleeding external hemorrhoids, no specimens   ESOPHAGOGASTRODUODENOSCOPY (EGD) WITH PROPOFOL N/A 01/17/2021    normal hypopharynx and esophagus, z line regular 37cm from incisors, 2cm HH, congestive gastropathy, hematobulbar and post bulbar mucosa with hemosiderosis but no evidence of PUD. no specimens   GIVENS CAPSULE STUDY N/A 02/02/2021   Procedure: GIVENS CAPSULE STUDY;  Surgeon: Harvel Quale, MD;  Location: [facility] ENDO SUITE;  Service: Gastroenterology;  Laterality: N/A;  7:30   LEFT HEART CATH AND CORONARY ANGIOGRAPHY Right 05/06/2016   Procedure: Left Heart Cath and Coronary Angiography;  Surgeon: [name], MD;  Location: [facility];  Service: Cardiovascular;  Laterality: Right;    Allergies: Patient has no known allergies.  Medications: Prior to Admission medications   Medication Sig Start Date End Date Taking? Authorizing Provider  albuterol (PROVENTIL HFA;VENTOLIN HFA) 108 (90 Base) MCG/ACT inhaler Inhale 2 puffs into the lungs daily as needed for wheezing or shortness of breath. 06/11/16   Alisa Graff, FNP  apixaban (ELIQUIS) 5 MG TABS tablet Take 1 tablet (5 mg total) by mouth 2 (two) times daily. 06/11/16   Alisa Graff, FNP  atorvastatin (LIPITOR) 40 MG tablet Take 1 tablet (40 mg total) by mouth daily at 6 PM. 06/11/16   Alisa Graff, FNP  blood glucose meter kit and supplies KIT Dispense based on patient and insurance preference. Use up to four times daily as directed. 01/20/21   Manuella Ghazi, Pratik D, DO  carvedilol (COREG) 12.5 MG tablet Take 1 tablet (12.5 mg total) by mouth 2 (two) times daily with a meal. 06/11/16   [place]ackney,  Aura Fey, FNP  ferrous sulfate 325 (65 FE) MG tablet Take 1 tablet (325 mg total) by mouth daily. 01/20/21 01/20/22  Manuella Ghazi, Pratik D, DO  guaiFENesin-dextromethorphan (ROBITUSSIN DM) 100-10 MG/5ML syrup Take 5 mLs by mouth every 4 (four) hours as needed for cough. 01/20/21   Manuella Ghazi, Pratik D, DO  pantoprazole (PROTONIX) 40 MG tablet Take 1 tablet (40 mg total) by mouth daily. 01/20/21 01/20/22  Manuella Ghazi, Pratik D, DO  sacubitril-valsartan (ENTRESTO)  24-26 MG Take 1 tablet by mouth 2 (two) times daily. 05/13/16   Alisa Graff, FNP  spironolactone (ALDACTONE) 25 MG tablet Take 1 tablet (25 mg total) by mouth daily. 01/20/21   Heath Lark D, DO     Family History  Problem Relation Age of Onset   Diabetes Mother    Heart disease Mother    Heart attack Father    Diabetes Brother    Breast cancer Maternal Grandmother    Breast cancer Maternal Aunt    Colon cancer Neg Hx    Colon polyps Neg Hx     Social History   Socioeconomic History   Marital status: Widowed    Spouse name: Not on file   Number of children: Not on file   Years of education: Not on file   Highest education level: Not on file  Occupational History   Not on file  Tobacco Use   Smoking status: Never   Smokeless tobacco: Never  Vaping Use   Vaping Use: Never used  Substance and Sexual Activity   Alcohol use: No   Drug use: No   Sexual activity: Not on file  Other Topics Concern   Not on file  Social History Narrative   Not on file   Social Determinants of Health   Financial Resource Strain: Not on file  Food Insecurity: Not on file  Transportation Needs: Not on file  Physical Activity: Not on file  Stress: Not on file  Social Connections: Not on file    Review of Systems: A 12 point ROS discussed and pertinent positives are indicated in the HPI above.  All other systems are negative.  Vital Signs: There were no vitals taken for this visit.  Physical Exam Vitals reviewed.  HENT:     Mouth/Throat:     Mouth: Mucous membranes are moist.  Cardiovascular:     Rate and Rhythm: Normal rate and regular rhythm.     Heart sounds: Normal heart sounds.  Pulmonary:     Effort: Pulmonary effort is normal.     Breath sounds: Normal breath sounds.  Abdominal:     Palpations: Abdomen is soft.     Tenderness: There is no abdominal tenderness.  Musculoskeletal:        General: Normal range of motion.  Skin:    General: Skin is warm.  Neurological:      Mental Status: She is alert and oriented to person, place, and time.  Psychiatric:        Behavior: Behavior normal.    Imaging: DG Chest 2 View  Result Date: 02/15/2021 CLINICAL DATA:  Hemoptysis. EXAM: CHEST - 2 VIEW COMPARISON:  One view chest x-ray FINDINGS: Heart is enlarged. Atherosclerotic changes are present at the aortic arch. Lungs are clear. No edema or effusion is present. No focal airspace disease is present. Degenerative changes are present in the thoracic spine. No focal osseous abnormality is present. IMPRESSION: 1. Cardiomegaly without failure. 2. No acute cardiopulmonary disease. Electronically Signed   By: Harrell Gave  Mattern M.D.   On: 02/15/2021 12:07   NM Pulmonary Perfusion  Result Date: 02/15/2021 CLINICAL DATA:  Hemoptysis. EXAM: NUCLEAR MEDICINE PERFUSION LUNG SCAN TECHNIQUE: Perfusion images were obtained in multiple projections after intravenous injection of radiopharmaceutical. Ventilation scans intentionally deferred if perfusion scan and chest x-ray adequate for interpretation during COVID 19 epidemic. RADIOPHARMACEUTICALS:  3.95 mCi Tc-37mMAA IV COMPARISON:  Two-view chest x-ray 02/15/2021 FINDINGS: No significant perfusion defect is present.  Normal lung volumes. IMPRESSION: No pulmonary embolus. Electronically Signed   By: CSan MorelleM.D.   On: 02/15/2021 12:05   [place]al  Result Date: 03/06/2021 CLINICAL DATA:  Renal failure EXAM: RENAL / URINARY TRACT ULTRASOUND COMPLETE COMPARISON:  None. FINDINGS: Right Kidney: Renal measurements: 11.3 x 5.6 x 5.5 cm = volume: 180.6 mL. Echogenicity increased. No mass or hydronephrosis visualized. Left Kidney: Suboptimally visualized. Renal measurements: 11.1 x 6 x 5.5 cm = volume: 191.1 mL. Echogenicity increased. No mass or hydronephrosis visualized. Bladder: Appears normal for degree of bladder distention. Other: None. IMPRESSION: No hydronephrosis. Increased renal echogenicity suggesting medical renal  disease. Electronically Signed   By: PMacy MisM.D.   On: 03/06/2021 12:32    Labs:  CBC: Recent Labs    03/10/21 0437 03/11/21 0629 03/12/21 0851 03/12/21 1504  WBC 5.2 4.1 6.0 6.2  HGB 8.2* 9.1* 8.9* 8.9*  HCT 26.2* 28.4* 28.3* 27.5*  PLT 236 259 261 249    COAGS: Recent Labs    02/14/21 1322 03/12/21 1504  INR 1.7* 1.2  APTT 39.8*  --     BMP: Recent Labs    03/10/21 0437 03/11/21 0629 03/12/21 0440 03/12/21 0851 03/13/21 0623  NA 138 134* 130*   130*  --  127*  K 5.0 5.1 5.6*   5.8* 5.2* 4.8  CL 110 106 103   103  --  101  CO2 20* 18* 15*   15*  --  16*  GLUCOSE 89 149* 143*   145*  --  140*  BUN 56* 63* 73*   73*  --  91*  CALCIUM 8.2* 8.7* 8.7*   8.5*  --  8.2*  CREATININE 5.10* 5.20* 5.14*   5.12*  --  5.73*  GFRNONAA 9* 8* 8*   9*  --  7*    LIVER FUNCTION TESTS: Recent Labs    01/16/21 0946 01/23/21 1211 02/14/21 1322 03/05/21 0959 03/08/21 0556 03/10/21 0437 03/11/21 0629 03/12/21 0440 03/13/21 0623  BILITOT 0.7 0.7 0.6 0.6  --   --   --   --   --   AST _0 14*  --   --   --   --   --   ALT _1 --   --   --   --   --   ALKPHOS 36* 49 58 63  --   --   --   --   --   PROT 5.9* 7.8 7.6 7.8  --   --   --   --   --   ALBUMIN 2.2* 2.8* 3.1* 3.4*   < > 2.9* 3.0* 3.0* 3.1*   < > = values in this interval not displayed.    TUMOR MARKERS: No results for input(s): AFPTM, CEA, CA199, CHROMGRNA in the last 8760 hours.  Assessment and Plan:  Acute/subacute renal failure Scheduled for random renal biopsy Risks and benefits of random renal biopsy was discussed with the patient and/or patient's family including, but not  limited to bleeding, infection, damage to adjacent structures or low yield requiring additional tests.  All of the questions were answered and there is agreement to proceed.  Consent signed and in chart.   Thank you for this interesting consult.  I greatly enjoyed meeting Mackenzie [name] and look forward to  participating in their care.  A copy of this report was sent to the requesting provider on this date.  Electronically Signed: Lavonia Drafts, PA-C 03/13/2021, 9:46 AM   I spent a total of 20 Minutes    in face to face in clinical consultation, greater than 50% of which was counseling/coordinating care for random renal biopsy

## 2021-03-13 NOTE — Plan of Care (Signed)

## 2021-03-13 NOTE — Progress Notes (Addendum)
PROGRESS NOTE    SMT. LODER  TDH:[id number] DOB: Sep 27, 1950 DOA: 03/06/2021 PCP: [name], MD   Brief Narrative:  This 71 years old female with PMH significant for systolic and diastolic CHF, hyperlipidemia, iron deficiency anemia, atrial fibrillation, GERD presented in the ED with abnormal labs.  Patient has been following up with her hematologist ,  routine work-up was obtained and showed serum creatinine 4.94 . Patient was advised by hematologist to get admitted for acute kidney injury. Patient is admitted for acute kidney injury, nephrology is consulted,  continued on IV hydration.  She is not a candidate for hemodialysis but patient will require renal biopsy.  She underwent random renal biopsy at [facility] 1/24.  Assessment & Plan:   Principal Problem:   Acute on chronic renal failure (HCC) Active Problems:   Chronic systolic heart failure (HCC)   HTN (hypertension)   Acute renal failure superimposed on stage 3a chronic kidney disease (HCC)  Acute kidney injury on chronic kidney disease 3A: Baseline serum creatinine 1.0-1.3. Creatinine on presentation 4.94   Etiology unclear, suspect a degree of volume depletion. Renal ultrasound: No hydronephrosis, increased renal echogenicity. Nephrology consulted,  recommended to continue IV hydration. Serum creatinine  trending up 4.94>5.02>4.78>5.10>5.20 >5.75 Nephrology recommended renal biopsy for diagnosis. C-ANCA: Neg, anti DNA: Neg, ASO: Neg, AntiGBM, Neg, ANA : Neg. 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 No acute indication for hemodialysis. She underwent random renal biopsy at [facility] on 1/24.  Chronic diastolic and systolic CHF: Clinically she appears euvolemic. Last echocardiogram 12/27/2020 shows LVEF 60 to 65%, no regional wall motion abnormalities. Continue to hold Entresto, spironolactone, continue  carvedilol.  Atrial fibrillation: Heart rate controlled, Continue carvedilol.  Discontinued Eliquis 03/10/21 in anticipation for renal biopsy. Resume when cleared by nephrology  Hyperlipidemia: Continue statins.  Essential hypertension: Continue carvedilol.  Iron deficiency anemia: Continue ferrous sulfate.  Hemoglobin  dropped to 7.1.   S/p 1 unit PRBC> Hb 7.4 >8.2 >9.2>8.9  Hyponatremia: Her sodium is dropping 130> 127 Could be due to IV fluids. Continue to Monitor.  DVT prophylaxis: SCDs, Eliquis on hold. Code Status: Full code. Family Communication: Daughter at bed side. Disposition Plan:   Status is: Inpatient  Remains inpatient appropriate because:  Admitted for acute kidney injury requiring IV hydration and nephrology evaluation.   She underwent random renal biopsy for diagnosis.  Anticipated  discharged home in 1 to 2 days.  Consultants:   Nephrology  Procedures:  None  Antimicrobials: None.  Subjective: Patient was seen and examined at bedside.  Overnight events noted. Patient reports feeling better, she denies any symptoms. She is anticipating random renal biopsy at [facility] today.  Objective: Vitals:   03/12/21 1448 03/12/21 2129 03/13/21 0504 03/13/21 1312  BP: 112/63 126/60 130/66 (!) 158/71  Pulse: 64 65 (!) 58 (!) 53  Resp: 16   15  Temp: 97.7 F (36.5 C) (!) 97.4 F (36.3 C) 97.7 F (36.5 C) 98.4 F (36.9 C)  TempSrc: Oral Oral Oral   SpO2: 100% 100% 98% 100%  Weight:      Height:        Intake/Output Summary (Last 24 hours) at 03/13/2021 1626 Last data filed at 03/13/2021 1500 Gross per 24 hour  Intake 700 ml  Output --  Net 700 ml   Filed Weights   03/06/21 1008 03/06/21 1531  Weight: 75.5 kg 76.2 kg  Examination:  General exam: Appears comfortable, deconditioned, not in any acute distress. Respiratory system: Clear to auscultation bilaterally. Respiratory effort normal.  RR 14 Cardiovascular system: S1-S2 heard,  regular rate, irregular rhythm, no murmur. Gastrointestinal system: Abdomen is soft, nontender, nondistended, BS+ Central nervous system: Alert and oriented X 2. No focal neurological deficits. Extremities: +Edema, no cyanosis, no clubbing. Skin: No rashes, lesions or ulcers Psychiatry: Judgement and insight appear normal. Mood & affect appropriate.     Data Reviewed: I have personally reviewed following labs and imaging studies  CBC: Recent Labs  Lab 03/09/21 0513 03/10/21 0437 03/11/21 0629 03/12/21 0851 03/12/21 1504  WBC 4.8 5.2 4.1 6.0 6.2  HGB 7.4* 8.2* 9.1* 8.9* 8.9*  HCT 24.0* 26.2* 28.4* 28.3* 27.5*  MCV 92.0 92.3 91.3 90.1 91.4  PLT 211 236 259 261 010   Basic Metabolic Panel: Recent Labs  Lab 03/08/21 0556 03/08/21 1015 03/09/21 0513 03/10/21 0437 03/11/21 0629 03/12/21 0440 03/12/21 0851 03/13/21 0623  NA 139  --  139 138 134* 130*   130*  --  127*  K 5.2*   < > 5.1 5.0 5.1 5.6*   5.8* 5.2* 4.8  CL 109  --  109 110 106 103   103  --  101  CO2 20*  --  20* 20* 18* 15*   15*  --  16*  GLUCOSE 84  --  80 89 149* 143*   145*  --  140*  BUN 60*  --  55* 56* 63* 73*   73*  --  91*  CREATININE 5.02*  --  4.78* 5.10* 5.20* 5.14*   5.12*  --  5.73*  CALCIUM 8.1*  --  8.2* 8.2* 8.7* 8.7*   8.5*  --  8.2*  MG 1.3*  --  1.7 1.6* 1.9 2.0  --   --   PHOS 4.9*   4.9*  --  4.7* 5.2*   5.3* 5.1*   5.1* 5.6*   5.5*  --  5.8*   < > = values in this interval not displayed.   GFR: Estimated Creatinine Clearance: 9.5 mL/min (A) (by C-G formula based on SCr of 5.73 mg/dL (H)). Liver Function Tests: Recent Labs  Lab 03/09/21 0513 03/10/21 0437 03/11/21 0629 03/12/21 0440 03/13/21 0623  ALBUMIN 2.8* 2.9* 3.0* 3.0* 3.1*   No results for input(s): LIPASE, AMYLASE in the last 168 hours. No results for input(s): AMMONIA in the last 168 hours. Coagulation Profile: Recent Labs  Lab 03/12/21 1504  INR 1.2   Cardiac Enzymes: No results for input(s): CKTOTAL, CKMB,  CKMBINDEX, TROPONINI in the last 168 hours.  BNP (last 3 results) No results for input(s): PROBNP in the last 8760 hours. HbA1C: No results for input(s): HGBA1C in the last 72 hours. CBG: No results for input(s): GLUCAP in the last 168 hours. Lipid Profile: No results for input(s): CHOL, HDL, LDLCALC, TRIG, CHOLHDL, LDLDIRECT in the last 72 hours. Thyroid Function Tests: No results for input(s): TSH, T4TOTAL, FREET4, T3FREE, THYROIDAB in the last 72 hours. Anemia Panel: No results for input(s): VITAMINB12, FOLATE, FERRITIN, TIBC, IRON, RETICCTPCT in the last 72 hours.  Sepsis Labs: No results for input(s): PROCALCITON, LATICACIDVEN in the last 168 hours.  Recent Results (from the past 240 hour(s))  Urine Culture     Status: None   Collection Time: 03/06/21 10:44 AM   Specimen: Urine, Clean Catch  Result Value Ref Range Status   Specimen Description   Final    URINE,  CLEAN CATCH Performed at [facility], [address] [place], [place] [address]    Special Requests   Final    NONE Performed at [facility] [address] [place], [place] [address]    Culture   Final    NO GROWTH Performed at [facility], [place] [address] [place], [place] [address]    Report Status 03/08/2021 FINAL  Final  Resp Panel by RT-PCR (Flu A&B, Covid) Nasopharyngeal Swab     Status: None   Collection Time: 03/06/21  1:11 PM   Specimen: Nasopharyngeal Swab; Nasopharyngeal(NP) swabs in vial transport medium  Result Value Ref Range Status   SARS Coronavirus 2 by RT PCR NEGATIVE NEGATIVE Final    Comment: (NOTE) SARS-CoV-2 target nucleic acids are NOT DETECTED.  The SARS-CoV-2 RNA is generally detectable in upper respiratory specimens during the acute phase of infection. The lowest concentration of SARS-CoV-2 viral copies this assay can detect is 138 copies/mL. A negative result does not preclude SARS-Cov-2 infection and should not be used as the sole basis for treatment or other patient  management decisions. A negative result may occur with  improper specimen collection/handling, submission of specimen other than nasopharyngeal swab, presence of viral mutation(s) within the areas targeted by this assay, and inadequate number of viral copies(<138 copies/mL). A negative result must be combined with clinical observations, patient history, and epidemiological information. The expected result is Negative.  Fact Sheet for Patients:  EntrepreneurPulse.com.au  Fact Sheet for Healthcare Providers:  IncredibleEmployment.be  This test is no t yet approved or cleared by the [place] FDA and  has been authorized for detection and/or diagnosis of SARS-CoV-2 by FDA under an Emergency Use Authorization (EUA). This EUA will remain  in effect (meaning this test can be used) for the duration of the COVID-19 declaration under Section 564(b)(1) of the Act, 21 U.S.C.section 360bbb-3(b)(1), unless the authorization is terminated  or revoked sooner.       Influenza A by PCR NEGATIVE NEGATIVE Final   Influenza B by PCR NEGATIVE NEGATIVE Final    Comment: (NOTE) The Xpert Xpress SARS-CoV-2/FLU/RSV plus assay is intended as an aid in the diagnosis of influenza from Nasopharyngeal swab specimens and should not be used as a sole basis for treatment. Nasal washings and aspirates are unacceptable for Xpert Xpress SARS-CoV-2/FLU/RSV testing.  Fact Sheet for Patients: EntrepreneurPulse.com.au  Fact Sheet for Healthcare Providers: IncredibleEmployment.be  This test is not yet approved or cleared by the [place] FDA and has been authorized for detection and/or diagnosis of SARS-CoV-2 by FDA under an Emergency Use Authorization (EUA). This EUA will remain in effect (meaning this test can be used) for the duration of the COVID-19 declaration under Section 564(b)(1) of the Act, 21 U.S.C. section 360bbb-3(b)(1),  unless the authorization is terminated or revoked.  Performed at [facility], [address], [place], [place] [address]     Radiology Studies: US BIOPSY (KIDNEY)  Result Date: 03/13/2021 INDICATION: 71 year old female with history of acute/subacute kidney injury. EXAM: ULTRASOUND GUIDED RENAL BIOPSY COMPARISON:  03/06/2021 MEDICATIONS: None. ANESTHESIA/SEDATION: Fentanyl 25 mcg IV; Versed 1 mg IV Total Moderate Sedation time: 11 minutes; The patient was continuously monitored during the procedure by the interventional radiology nurse under my direct supervision. COMPLICATIONS: None immediate. PROCEDURE: Informed written consent was obtained from the patient after a discussion of the risks, benefits and alternatives to treatment. The patient understands and consents the procedure. A timeout was performed prior to the initiation of the procedure. Ultrasound  scanning was performed of the bilateral flanks. The inferior pole of the right kidney was selected for biopsy due to location and sonographic window. The procedure was planned. The operative site was prepped and draped in the usual sterile fashion. The overlying soft tissues were anesthetized with 1% lidocaine with epinephrine. A 15 gauge coaxial introducer needle was advanced to the inferior cortex of the right kidney followed by introduction of a 16 gauge core needle biopsy device, then 2 core biopsies were obtained under direct ultrasound guidance. Images were saved for documentation purposes. The biopsy device was removed and hemostasis was obtained by injection of a Gel-Foam slurry along the needle tract under ultrasound guidance and manual compression. Post procedural scanning was negative for significant post procedural hemorrhage or additional complication. A dressing was placed. The patient tolerated the procedure well without immediate post procedural complication. IMPRESSION: Technically successful ultrasound guided right renal biopsy. Ruthann Cancer, MD [facility] Electronically Signed   By: Ruthann Cancer M.D.   On: 03/13/2021 13:07    Scheduled Meds:  sodium chloride   Intravenous Once   amLODipine  10 mg Oral Daily   atorvastatin  40 mg Oral q1800   calcium-vitamin D  1 tablet Oral BID   carvedilol  12.5 mg Oral BID WC   famotidine  20 mg Oral Daily   ferrous sulfate  325 mg Oral Daily   pantoprazole  40 mg Oral Daily   predniSONE  60 mg Oral Q breakfast   sodium bicarbonate  1,300 mg Oral TID   Continuous Infusions:     LOS: 7 days    Time spent: [place], MD [facility]   If 7PM-7AM, please contact night-coverage

## 2021-03-14 LAB — RENAL FUNCTION PANEL
Albumin: 2.6 g/dL — ABNORMAL LOW (ref 3.5–5.0)
Anion gap: 12 (ref 5–15)
BUN: 98 mg/dL — ABNORMAL HIGH (ref 8–23)
CO2: 16 mmol/L — ABNORMAL LOW (ref 22–32)
Calcium: 7.7 mg/dL — ABNORMAL LOW (ref 8.9–10.3)
Chloride: 100 mmol/L (ref 98–111)
Creatinine, Ser: 5.8 mg/dL — ABNORMAL HIGH (ref 0.44–1.00)
GFR, Estimated: 7 mL/min — ABNORMAL LOW (ref >60)
Glucose, Bld: 128 mg/dL — ABNORMAL HIGH (ref 70–99)
Phosphorus: 5.5 mg/dL — ABNORMAL HIGH (ref 2.5–4.6)
Potassium: 5 mmol/L (ref 3.5–5.1)
Sodium: 128 mmol/L — ABNORMAL LOW (ref 135–145)

## 2021-03-14 LAB — QUANTIFERON-TB GOLD PLUS (RQFGPL)
QuantiFERON Mitogen Value: 0.63 IU/mL
QuantiFERON Nil Value: 0.01 IU/mL
QuantiFERON TB1 Ag Value: 0.01 IU/mL
QuantiFERON TB2 Ag Value: 0.01 IU/mL

## 2021-03-14 LAB — MAGNESIUM: Magnesium: 1.7 mg/dL (ref 1.7–2.4)

## 2021-03-14 LAB — QUANTIFERON-TB GOLD PLUS: QuantiFERON-TB Gold Plus: NEGATIVE

## 2021-03-14 LAB — ANCA PROFILE
Anti-MPO Antibodies: >8 units — ABNORMAL HIGH (ref 0.0–0.9)
Anti-PR3 Antibodies: 0.4 units (ref 0.0–0.9)
Atypical P-ANCA titer: <1:20 {titer}
C-ANCA: <1:20 {titer}
P-ANCA: 1:640 {titer} — ABNORMAL HIGH

## 2021-03-14 LAB — CBC
HCT: 25.8 % — ABNORMAL LOW (ref 36.0–46.0)
Hemoglobin: 8.4 g/dL — ABNORMAL LOW (ref 12.0–15.0)
MCH: 29 pg (ref 26.0–34.0)
MCHC: 32.6 g/dL (ref 30.0–36.0)
MCV: 89 fL (ref 80.0–100.0)
Platelets: 292 10*3/uL (ref 150–400)
RBC: 2.9 MIL/uL — ABNORMAL LOW (ref 3.87–5.11)
RDW: 15.9 % — ABNORMAL HIGH (ref 11.5–15.5)
WBC: 6 10*3/uL (ref 4.0–10.5)
nRBC: 0 % (ref 0.0–0.2)

## 2021-03-14 LAB — PHOSPHORUS: Phosphorus: 5.5 mg/dL — ABNORMAL HIGH (ref 2.5–4.6)

## 2021-03-14 NOTE — Progress Notes (Signed)
Patient ID: Mackenzie [name], female   DOB: 1950/07/07, 71 y.o.   MRN: [id number] S: Pt reports that she feels "fine" and able to do everything she normally can do.  No changes overnight and tolerated renal biopsy. O:BP 128/66 (BP Location: Left Arm)    Pulse (!) 56    Temp 97.6 F (36.4 C) (Oral)    Resp 18    Ht $R'5\' 6"'qq$  (1.676 m)    Wt 76.2 kg    SpO2 100%    BMI 27.11 kg/m   Intake/Output Summary (Last 24 hours) at 03/14/2021 1035 Last data filed at 03/14/2021 0959 Gross per 24 hour  Intake 960 ml  Output --  Net 960 ml   Intake/Output: I/O last 3 completed shifts: In: 720 [P.O.:720] Out: -   Intake/Output this shift:  Total I/O In: 240 [P.O.:240] Out: -  Weight change:  Gen:NAD CVS: RRR Resp: CTA Abd: +BS, soft, NT/ND Ext: no edema Neuro: no asterixis.  Recent Labs  Lab 03/08/21 0556 03/08/21 1015 03/09/21 0513 03/10/21 0437 03/11/21 0629 03/12/21 0440 03/12/21 0851 03/13/21 0623 03/14/21 0545  NA 139  --  139 138 134* 130*   130*  --  127* 128*  K 5.2*   < > 5.1 5.0 5.1 5.6*   5.8* 5.2* 4.8 5.0  CL 109  --  109 110 106 103   103  --  101 100  CO2 20*  --  20* 20* 18* 15*   15*  --  16* 16*  GLUCOSE 84  --  80 89 149* 143*   145*  --  140* 128*  BUN 60*  --  55* 56* 63* 73*   73*  --  91* 98*  CREATININE 5.02*  --  4.78* 5.10* 5.20* 5.14*   5.12*  --  5.73* 5.80*  ALBUMIN 2.9*  --  2.8* 2.9* 3.0* 3.0*  --  3.1* 2.6*  CALCIUM 8.1*  --  8.2* 8.2* 8.7* 8.7*   8.5*  --  8.2* 7.7*  PHOS 4.9*   4.9*  --  4.7* 5.2*   5.3* 5.1*   5.1* 5.6*   5.5*  --  5.8* 5.5*   5.5*   < > = values in this interval not displayed.   Liver Function Tests: Recent Labs  Lab 03/12/21 0440 03/13/21 0623 03/14/21 0545  ALBUMIN 3.0* 3.1* 2.6*   No results for input(s): LIPASE, AMYLASE in the last 168 hours. No results for input(s): AMMONIA in the last 168 hours. CBC: Recent Labs  Lab 03/10/21 0437 03/11/21 0629 03/12/21 0851 03/12/21 1504 03/14/21 0545  WBC 5.2 4.1 6.0 6.2 6.0  HGB  8.2* 9.1*   8.9* 8.9* 8.9* 8.4*  HCT 26.2* 28.4* 28.3* 27.5* 25.8*  MCV 92.3 91.3 90.1 91.4 89.0  PLT 236 259 261 249 292   Cardiac Enzymes: No results for input(s): CKTOTAL, CKMB, CKMBINDEX, TROPONINI in the last 168 hours. CBG: No results for input(s): GLUCAP in the last 168 hours.  Iron Studies: No results for input(s): IRON, TIBC, TRANSFERRIN, FERRITIN in the last 72 hours. Studies/Results: US BIOPSY (KIDNEY)  Result Date: 03/13/2021 INDICATION: 71 year old female with history of acute/subacute kidney injury. EXAM: ULTRASOUND GUIDED RENAL BIOPSY COMPARISON:  03/06/2021 MEDICATIONS: None. ANESTHESIA/SEDATION: Fentanyl 25 mcg IV; Versed 1 mg IV Total Moderate Sedation time: 11 minutes; The patient was continuously monitored during the procedure by the interventional radiology nurse under my direct supervision. COMPLICATIONS: None immediate. PROCEDURE: Informed written consent was obtained from the  patient after a discussion of the risks, benefits and alternatives to treatment. The patient understands and consents the procedure. A timeout was performed prior to the initiation of the procedure. Ultrasound scanning was performed of the bilateral flanks. The inferior pole of the right kidney was selected for biopsy due to location and sonographic window. The procedure was planned. The operative site was prepped and draped in the usual sterile fashion. The overlying soft tissues were anesthetized with 1% lidocaine with epinephrine. A 15 gauge coaxial introducer needle was advanced to the inferior cortex of the right kidney followed by introduction of a 16 gauge core needle biopsy device, then 2 core biopsies were obtained under direct ultrasound guidance. Images were saved for documentation purposes. The biopsy device was removed and hemostasis was obtained by injection of a Gel-Foam slurry along the needle tract under ultrasound guidance and manual compression. Post procedural scanning was negative for  significant post procedural hemorrhage or additional complication. A dressing was placed. The patient tolerated the procedure well without immediate post procedural complication. IMPRESSION: Technically successful ultrasound guided right renal biopsy. Ruthann Cancer, MD [facility] Radiology Electronically Signed   By: Ruthann Cancer M.D.   On: 03/13/2021 13:07    sodium chloride   Intravenous Once   amLODipine  10 mg Oral Daily   atorvastatin  40 mg Oral q1800   calcium-vitamin D  1 tablet Oral BID   carvedilol  12.5 mg Oral BID WC   famotidine  20 mg Oral Daily   ferrous sulfate  325 mg Oral Daily   pantoprazole  40 mg Oral Daily   predniSONE  60 mg Oral Q breakfast   sodium bicarbonate  1,300 mg Oral TID    BMET    Component Value Date/Time   NA 128 (L) 03/14/2021 0545   K 5.0 03/14/2021 0545   CL 100 03/14/2021 0545   CO2 16 (L) 03/14/2021 0545   GLUCOSE 128 (H) 03/14/2021 0545   BUN 98 (H) 03/14/2021 0545   CREATININE 5.80 (H) 03/14/2021 0545   CALCIUM 7.7 (L) 03/14/2021 0545   GFRNONAA 7 (L) 03/14/2021 0545   GFRAA >60 01/10/2018 1800   CBC    Component Value Date/Time   WBC 6.0 03/14/2021 0545   RBC 2.90 (L) 03/14/2021 0545   HGB 8.4 (L) 03/14/2021 0545   HGB 8.9 (L) 03/11/2021 0629   HCT 25.8 (L) 03/14/2021 0545   PLT 292 03/14/2021 0545   MCV 89.0 03/14/2021 0545   MCH 29.0 03/14/2021 0545   MCHC 32.6 03/14/2021 0545   RDW 15.9 (H) 03/14/2021 0545   LYMPHSABS 1.0 03/05/2021 0959   MONOABS 0.4 03/05/2021 0959   EOSABS 0.1 03/05/2021 0959   BASOSABS 0.0 03/05/2021 0959    Assessment/Plan:    Assessment/Plan:  AKI vs subacute renal failure- Scr started rising at the end of November and has continued to rise.  She is completely asymptomatic and no inciting event.  Pre-renal insults with entresto and spironolactone.  UA with some leukocytes and > 50 RBC. UP/C 0.41.  [place] without obstruction but increased echogenicity.   No history of stones.   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  Started H2 blocker and Ca+D with steroids.  G6PDH was normal so can use bactrim DS MWF for PCP prophylaxis planning. Renal biopsy - completed at [facility] 1/24 and awaiting results. She is taking normal PO now so can hold on fluids; resume PRN Continue to hold entresto and aldactone for now  No acute indication for dialysis  Cont to follow strict I/Os Discussed possible need for HD if her renal function continues to worsen, however she is not interested in HD and doesn't think she will need it.  Chronic diastolic and systolic CHF - note entresto and aldactone on hold.  Hold IVF today - volume status looks ok; can give diuetics PRN. A fib - per primary team.  She is on eliquis and this is on hold for renal biopsy.  Last dose was 1/20 PM. HTN -BP is currently stable Iron deficiency anemia - followed by hematology. S/p PRBC's on 1/19.  Hemoglobin stable posttransfusion Hypomag - s/p repletion, stable Hyponatremia - asymptomatic and likely due to AKI. Elevated serum free light chains ratio - noted.  Up/C 0.41. 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  [name], MD [employer] [phone]

## 2021-03-14 NOTE — Progress Notes (Signed)
PROGRESS NOTE    Mackenzie [name]  NLZ:[id number] DOB: 28-Jul-1950 DOA: 03/06/2021 PCP: [name], MD   Brief Narrative:  This 71 years old female with PMH significant for systolic and diastolic CHF, hyperlipidemia, iron deficiency anemia, atrial fibrillation, GERD presented in the ED with abnormal labs.  Patient has been following up with her hematologist ,  routine work-up was obtained and showed serum creatinine 4.94 . Patient was advised by hematologist to get admitted for acute kidney injury. Patient is admitted for acute kidney injury, nephrology is consulted,  continued on IV hydration.  She is not a candidate for hemodialysis but patient will require renal biopsy.  She underwent random renal biopsy at [facility] 1/24.  Assessment & Plan:   Principal Problem:   Acute on chronic renal failure (HCC) Active Problems:   Chronic systolic heart failure (HCC)   HTN (hypertension)   Acute renal failure superimposed on stage 3a chronic kidney disease (HCC)  Acute kidney injury on chronic kidney disease 3A: Baseline serum creatinine 1.0-1.3. Creatinine on presentation 4.94   Etiology unclear, suspect a degree of volume depletion. Renal ultrasound: No hydronephrosis, increased renal echogenicity. Nephrology consulted Serum creatinine  trending up 4.94>5.02>4.78>5.10>5.20 >5.75 Nephrology recommended renal biopsy for diagnosis. C-ANCA: Neg, anti DNA: Neg, ASO: Neg, AntiGBM, Neg, ANA : Neg. 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 No acute indication for hemodialysis. She underwent random renal biopsy at [facility] on 1/24. Solu-Medrol 500 mg IV x3 days completed now on prednisone 60 mg daily with taper as an outpatient  Chronic diastolic and systolic CHF: Clinically she appears euvolemic. Last echocardiogram 12/27/2020 shows LVEF 60 to 65%, no regional wall motion  abnormalities. Continue to hold Entresto, spironolactone, continue carvedilol.  Atrial fibrillation: Heart rate controlled, Continue carvedilol.  Discontinued Eliquis 03/10/21 in anticipation for renal biopsy. Resume when cleared by nephrology  Hyperlipidemia: Continue statins.  Essential hypertension: Continue carvedilol.  Iron deficiency anemia: Continue ferrous sulfate.  Hemoglobin  dropped to 7.1.   S/p 1 unit PRBC> Hb 7.4 >8.2 >9.2>8.9  Hyponatremia: Her sodium is dropping 130> 127 Could be due to IV fluids. Continue to Monitor.  DVT prophylaxis: SCDs, Eliquis on hold. Code Status: Full code. Family Communication: Daughter at bed side. Disposition Plan:   Status is: Inpatient  Remains inpatient appropriate because:  Admitted for acute kidney injury requiring IV hydration and nephrology evaluation.   She underwent random renal biopsy for diagnosis.  Anticipated  discharged when cleared by nephrology  Consultants:   Nephrology  Procedures:  None  Antimicrobials: None.  Subjective: Patient feels normal urinating normal no shortness of breath   objective: Vitals:   03/13/21 1728 03/13/21 2157 03/14/21 0628 03/14/21 0755  BP:  (!) 110/54 122/63 128/66  Pulse: 63 61 (!) 58 (!) 56  Resp:  _0 Temp:  98.2 F (36.8 C) 97.6 F (36.4 C)   TempSrc:  Oral Oral   SpO2:  100% 100% 100%  Weight:      Height:        Intake/Output Summary (Last 24 hours) at 03/14/2021 1116 Last data filed at 03/14/2021 0959 Gross per 24 hour  Intake 960 ml  Output --  Net 960 ml   Filed Weights   03/06/21 1008 03/06/21 1531  Weight: 75.5 kg 76.2 kg    Examination:  General exam: Appears comfortable, deconditioned, not in any acute distress. Respiratory system:  Clear to auscultation bilaterally. Respiratory effort normal.  RR 14 Cardiovascular system: S1-S2 heard, regular rate, irregular rhythm, no murmur. Gastrointestinal system: Abdomen is soft, nontender,  nondistended, BS+ Central nervous system: Alert and oriented X 2. No focal neurological deficits. Extremities: +Edema, no cyanosis, no clubbing. Skin: No rashes, lesions or ulcers Psychiatry: Judgement and insight appear normal. Mood & affect appropriate.     Data Reviewed: I have personally reviewed following labs and imaging studies  CBC: Recent Labs  Lab 03/10/21 0437 03/11/21 0629 03/12/21 0851 03/12/21 1504 03/14/21 0545  WBC 5.2 4.1 6.0 6.2 6.0  HGB 8.2* 9.1*   8.9* 8.9* 8.9* 8.4*  HCT 26.2* 28.4* 28.3* 27.5* 25.8*  MCV 92.3 91.3 90.1 91.4 89.0  PLT 236 259 261 249 563   Basic Metabolic Panel: Recent Labs  Lab 03/09/21 0513 03/10/21 0437 03/11/21 0629 03/12/21 0440 03/12/21 0851 03/13/21 0623 03/14/21 0545  NA 139 138 134* 130*   130*  --  127* 128*  K 5.1 5.0 5.1 5.6*   5.8* 5.2* 4.8 5.0  CL 109 110 106 103   103  --  101 100  CO2 20* 20* 18* 15*   15*  --  16* 16*  GLUCOSE 80 89 149* 143*   145*  --  140* 128*  BUN 55* 56* 63* 73*   73*  --  91* 98*  CREATININE 4.78* 5.10* 5.20* 5.14*   5.12*  --  5.73* 5.80*  CALCIUM 8.2* 8.2* 8.7* 8.7*   8.5*  --  8.2* 7.7*  MG 1.7 1.6* 1.9 2.0  --   --  1.7  PHOS 4.7* 5.2*   5.3* 5.1*   5.1* 5.6*   5.5*  --  5.8* 5.5*   5.5*   GFR: Estimated Creatinine Clearance: 9.4 mL/min (A) (by C-G formula based on SCr of 5.8 mg/dL (H)). Liver Function Tests: Recent Labs  Lab 03/10/21 0437 03/11/21 0629 03/12/21 0440 03/13/21 0623 03/14/21 0545  ALBUMIN 2.9* 3.0* 3.0* 3.1* 2.6*   No results for input(s): LIPASE, AMYLASE in the last 168 hours. No results for input(s): AMMONIA in the last 168 hours. Coagulation Profile: Recent Labs  Lab 03/12/21 1504  INR 1.2   Cardiac Enzymes: No results for input(s): CKTOTAL, CKMB, CKMBINDEX, TROPONINI in the last 168 hours.  BNP (last 3 results) No results for input(s): PROBNP in the last 8760 hours. HbA1C: No results for input(s): HGBA1C in the last 72 hours. CBG: No results  for input(s): GLUCAP in the last 168 hours. Lipid Profile: No results for input(s): CHOL, HDL, LDLCALC, TRIG, CHOLHDL, LDLDIRECT in the last 72 hours. Thyroid Function Tests: No results for input(s): TSH, T4TOTAL, FREET4, T3FREE, THYROIDAB in the last 72 hours. Anemia Panel: No results for input(s): VITAMINB12, FOLATE, FERRITIN, TIBC, IRON, RETICCTPCT in the last 72 hours.  Sepsis Labs: No results for input(s): PROCALCITON, LATICACIDVEN in the last 168 hours.  Recent Results (from the past 240 hour(s))  Urine Culture     Status: None   Collection Time: 03/06/21 10:44 AM   Specimen: Urine, Clean Catch  Result Value Ref Range Status   Specimen Description   Final    URINE, CLEAN CATCH Performed at [facility], [address] [place], [place] [address]    Special Requests   Final    NONE Performed at [facility], [address], [place], [place] [address]    Culture   Final    NO GROWTH Performed at [facility], [address] [place], [place] [address]    Report Status 03/08/2021 FINAL  Final  Resp Panel by RT-PCR (Flu A&B, Covid) Nasopharyngeal Swab     Status: None   Collection Time: 03/06/21  1:11 PM   Specimen: Nasopharyngeal Swab; Nasopharyngeal(NP) swabs in vial transport medium  Result Value Ref Range Status   SARS Coronavirus 2 by RT PCR NEGATIVE NEGATIVE Final    Comment: (NOTE) SARS-CoV-2 target nucleic acids are NOT DETECTED.  The SARS-CoV-2 RNA is generally detectable in upper respiratory specimens during the acute phase of infection. The lowest concentration of SARS-CoV-2 viral copies this assay can detect is 138 copies/mL. A negative result does not preclude SARS-Cov-2 infection and should not be used as the sole basis for treatment or other patient management decisions. A negative result may occur with  improper specimen collection/handling, submission of specimen other than nasopharyngeal swab, presence of viral mutation(s) within the areas targeted  by this assay, and inadequate number of viral copies(<138 copies/mL). A negative result must be combined with clinical observations, patient history, and epidemiological information. The expected result is Negative.  Fact Sheet for Patients:  EntrepreneurPulse.com.au  Fact Sheet for Healthcare Providers:  IncredibleEmployment.be  This test is no t yet approved or cleared by the [place] FDA and  has been authorized for detection and/or diagnosis of SARS-CoV-2 by FDA under an Emergency Use Authorization (EUA). This EUA will remain  in effect (meaning this test can be used) for the duration of the COVID-19 declaration under Section 564(b)(1) of the Act, 21 U.S.C.section 360bbb-3(b)(1), unless the authorization is terminated  or revoked sooner.       Influenza A by PCR NEGATIVE NEGATIVE Final   Influenza B by PCR NEGATIVE NEGATIVE Final    Comment: (NOTE) The Xpert Xpress SARS-CoV-2/FLU/RSV plus assay is intended as an aid in the diagnosis of influenza from Nasopharyngeal swab specimens and should not be used as a sole basis for treatment. Nasal washings and aspirates are unacceptable for Xpert Xpress SARS-CoV-2/FLU/RSV testing.  Fact Sheet for Patients: EntrepreneurPulse.com.au  Fact Sheet for Healthcare Providers: IncredibleEmployment.be  This test is not yet approved or cleared by the [place] FDA and has been authorized for detection and/or diagnosis of SARS-CoV-2 by FDA under an Emergency Use Authorization (EUA). This EUA will remain in effect (meaning this test can be used) for the duration of the COVID-19 declaration under Section 564(b)(1) of the Act, 21 U.S.C. section 360bbb-3(b)(1), unless the authorization is terminated or revoked.  Performed at [facility], [address] [place], [place] [address]     Radiology Studies: US BIOPSY (KIDNEY)  Result Date: 03/13/2021 INDICATION:  71 year old female with history of acute/subacute kidney injury. EXAM: ULTRASOUND GUIDED RENAL BIOPSY COMPARISON:  03/06/2021 MEDICATIONS: None. ANESTHESIA/SEDATION: Fentanyl 25 mcg IV; Versed 1 mg IV Total Moderate Sedation time: 11 minutes; The patient was continuously monitored during the procedure by the interventional radiology nurse under my direct supervision. COMPLICATIONS: None immediate. PROCEDURE: Informed written consent was obtained from the patient after a discussion of the risks, benefits and alternatives to treatment. The patient understands and consents the procedure. A timeout was performed prior to the initiation of the procedure. Ultrasound scanning was performed of the bilateral flanks. The inferior pole of the right kidney was selected for biopsy due to location and sonographic window. The procedure was planned. The operative site was prepped and draped in the usual sterile fashion. The overlying soft tissues were anesthetized with 1% lidocaine with epinephrine. A 15 gauge coaxial  introducer needle was advanced to the inferior cortex of the right kidney followed by introduction of a 16 gauge core needle biopsy device, then 2 core biopsies were obtained under direct ultrasound guidance. Images were saved for documentation purposes. The biopsy device was removed and hemostasis was obtained by injection of a Gel-Foam slurry along the needle tract under ultrasound guidance and manual compression. Post procedural scanning was negative for significant post procedural hemorrhage or additional complication. A dressing was placed. The patient tolerated the procedure well without immediate post procedural complication. IMPRESSION: Technically successful ultrasound guided right renal biopsy. Ruthann Cancer, MD [facility] Radiology Electronically Signed   By: Ruthann Cancer M.D.   On: 03/13/2021 13:07    Scheduled Meds:  sodium chloride   Intravenous Once    amLODipine  10 mg Oral Daily   atorvastatin  40 mg Oral q1800   calcium-vitamin D  1 tablet Oral BID   carvedilol  12.5 mg Oral BID WC   famotidine  20 mg Oral Daily   ferrous sulfate  325 mg Oral Daily   pantoprazole  40 mg Oral Daily   predniSONE  60 mg Oral Q breakfast   sodium bicarbonate  1,300 mg Oral TID   Continuous Infusions:     LOS: 8 days    Time spent: 35 MINS    Shreeya Recendiz A, MD [facility]   If 7PM-7AM, please contact night-coverage Patient ID: [name], female   DOB: 04/25/1950, 71 y.o.   MRN: [id number]

## 2021-03-14 NOTE — Progress Notes (Signed)
Tele called and stated pt had a 15 second HR of 38 and it appeared to be second degree heart block type 2. MD notified.

## 2021-03-14 NOTE — Care Management Important Message (Signed)
Important Message  Patient Details  Name: Mackenzie [name] MRN: [id number] Date of Birth: [birth year]   Medicare Important Message Given:  Yes     [name] 03/14/2021, 1:08 PM

## 2021-03-14 NOTE — Plan of Care (Signed)

## 2021-03-15 ENCOUNTER — Encounter (HOSPITAL_COMMUNITY): Payer: Self-pay

## 2021-03-15 LAB — RENAL FUNCTION PANEL
Albumin: 2.7 g/dL — ABNORMAL LOW (ref 3.5–5.0)
Anion gap: 10 (ref 5–15)
BUN: 106 mg/dL — ABNORMAL HIGH (ref 8–23)
CO2: 19 mmol/L — ABNORMAL LOW (ref 22–32)
Calcium: 7.3 mg/dL — ABNORMAL LOW (ref 8.9–10.3)
Chloride: 99 mmol/L (ref 98–111)
Creatinine, Ser: 5.88 mg/dL — ABNORMAL HIGH (ref 0.44–1.00)
GFR, Estimated: 7 mL/min — ABNORMAL LOW (ref >60)
Glucose, Bld: 95 mg/dL (ref 70–99)
Phosphorus: 5.5 mg/dL — ABNORMAL HIGH (ref 2.5–4.6)
Potassium: 4.7 mmol/L (ref 3.5–5.1)
Sodium: 128 mmol/L — ABNORMAL LOW (ref 135–145)

## 2021-03-15 LAB — CBC
HCT: 29.5 % — ABNORMAL LOW (ref 36.0–46.0)
Hemoglobin: 9.9 g/dL — ABNORMAL LOW (ref 12.0–15.0)
MCH: 29.6 pg (ref 26.0–34.0)
MCHC: 33.6 g/dL (ref 30.0–36.0)
MCV: 88.3 fL (ref 80.0–100.0)
Platelets: 288 10*3/uL (ref 150–400)
RBC: 3.34 MIL/uL — ABNORMAL LOW (ref 3.87–5.11)
RDW: 15.9 % — ABNORMAL HIGH (ref 11.5–15.5)
WBC: 10 10*3/uL (ref 4.0–10.5)
nRBC: 0 % (ref 0.0–0.2)

## 2021-03-15 LAB — SURGICAL PATHOLOGY

## 2021-03-15 MED ORDER — ACETAMINOPHEN 325 MG PO TABS
650.0000 mg | ORAL_TABLET | Freq: Once | ORAL | Status: AC
  Administered 2021-03-15: 650 mg via ORAL
  Filled 2021-03-15: qty 2

## 2021-03-15 MED ORDER — SODIUM CHLORIDE 0.9 % IV BOLUS: 1000.0000 mL | Freq: Once | INTRAVENOUS | Status: DC | PRN

## 2021-03-15 MED ORDER — APIXABAN 5 MG PO TABS
5.0000 mg | ORAL_TABLET | Freq: Two times a day (BID) | ORAL | Status: DC
  Administered 2021-03-15 – 2021-03-16 (×3): 5 mg via ORAL
  Filled 2021-03-15 (×3): qty 1

## 2021-03-15 MED ORDER — DIPHENHYDRAMINE HCL 25 MG PO CAPS
50.0000 mg | ORAL_CAPSULE | Freq: Once | ORAL | Status: AC
Start: 2021-03-15 — End: 2021-03-15
  Administered 2021-03-15: 50 mg via ORAL
  Filled 2021-03-15: qty 2

## 2021-03-15 MED ORDER — DIPHENHYDRAMINE HCL 25 MG PO CAPS
50.0000 mg | ORAL_CAPSULE | Freq: Once | ORAL | Status: AC
  Administered 2021-03-15: 50 mg via ORAL
  Filled 2021-03-15: qty 2

## 2021-03-15 MED ORDER — DIPHENHYDRAMINE HCL 50 MG/ML IJ SOLN: 50.0000 mg | Freq: Once | INTRAMUSCULAR | Status: DC | PRN

## 2021-03-15 MED ORDER — ALBUTEROL SULFATE (2.5 MG/3ML) 0.083% IN NEBU: 2.5000 mg | INHALATION_SOLUTION | Freq: Once | RESPIRATORY_TRACT | Status: DC | PRN

## 2021-03-15 MED ORDER — METHYLPREDNISOLONE SODIUM SUCC 125 MG IJ SOLR: 125.0000 mg | Freq: Once | INTRAMUSCULAR | Status: DC | PRN

## 2021-03-15 MED ORDER — AMLODIPINE BESYLATE 5 MG PO TABS
5.0000 mg | ORAL_TABLET | Freq: Every day | ORAL | Status: DC
  Administered 2021-03-15 – 2021-03-16 (×2): 5 mg via ORAL
  Filled 2021-03-15: qty 1

## 2021-03-15 MED ORDER — SODIUM CHLORIDE 0.9 % IV SOLN
1000.0000 mg | Freq: Once | INTRAVENOUS | Status: AC
  Administered 2021-03-15: 1000 mg via INTRAVENOUS
  Filled 2021-03-15: qty 100

## 2021-03-15 MED ORDER — EPINEPHRINE PF 1 MG/ML IJ SOLN: 0.3000 mg | INTRAMUSCULAR | Status: DC | PRN

## 2021-03-15 MED ORDER — SULFAMETHOXAZOLE-TRIMETHOPRIM 800-160 MG PO TABS
1.0000 | ORAL_TABLET | ORAL | Status: DC
  Administered 2021-03-16: 1 via ORAL
  Filled 2021-03-15: qty 1

## 2021-03-15 MED ORDER — FAMOTIDINE IN NACL 20-0.9 MG/50ML-% IV SOLN: 20.0000 mg | Freq: Once | INTRAVENOUS | Status: DC | PRN

## 2021-03-15 NOTE — Progress Notes (Signed)
Patient received pre medication Tylenol and Benadryl prior to receiving Rituxan. Contacted pharmacy regarding sending Rituximab(Rituxan), pharmacist stated medication had to be sent from [facility]. Made MD Shanon Brow and MD [facility] aware. Per MD [place] give dose of Tylenol and Benadryl 4 hours after last dose and then start Rituximab.

## 2021-03-15 NOTE — Progress Notes (Addendum)
Patient ID: Mackenzie [name], female   DOB: 07/10/1950, 71 y.o.   MRN: [id number] S: Feels well, but reports that her heart rate dropped into the 30's yesterday but was asymptomatic. O:BP 129/71 (BP Location: Left Arm)    Pulse (!) 57    Temp 98.2 F (36.8 C) (Oral)    Resp 18    Ht 5' 6" (1.676 m)    Wt 76.2 kg    SpO2 100%    BMI 27.11 kg/m   Intake/Output Summary (Last 24 hours) at 03/15/2021 0900 Last data filed at 03/14/2021 1900 Gross per 24 hour  Intake 720 ml  Output --  Net 720 ml   Intake/Output: I/O last 3 completed shifts: In: 960 [P.O.:960] Out: -   Intake/Output this shift:  No intake/output data recorded. Weight change:  Gen:NAD, eating breakfast in bed. CVS: bradycardic, no rub Resp: CTA Abd: +BS, soft, NT/ND Ext: no edema  Recent Labs  Lab 03/09/21 0513 03/10/21 0437 03/11/21 0629 03/12/21 0440 03/12/21 0851 03/13/21 0623 03/14/21 0545 03/15/21 0738  NA 139 138 134* 130*   130*  --  127* 128* 128*  K 5.1 5.0 5.1 5.6*   5.8* 5.2* 4.8 5.0 4.7  CL 109 110 106 103   103  --  101 100 99  CO2 20* 20* 18* 15*   15*  --  16* 16* 19*  GLUCOSE 80 89 149* 143*   145*  --  140* 128* 95  BUN 55* 56* 63* 73*   73*  --  91* 98* 106*  CREATININE 4.78* 5.10* 5.20* 5.14*   5.12*  --  5.73* 5.80* 5.88*  ALBUMIN 2.8* 2.9* 3.0* 3.0*  --  3.1* 2.6* 2.7*  CALCIUM 8.2* 8.2* 8.7* 8.7*   8.5*  --  8.2* 7.7* 7.3*  PHOS 4.7* 5.2*   5.3* 5.1*   5.1* 5.6*   5.5*  --  5.8* 5.5*   5.5* 5.5*   Liver Function Tests: Recent Labs  Lab 03/13/21 0623 03/14/21 0545 03/15/21 0738  ALBUMIN 3.1* 2.6* 2.7*   No results for input(s): LIPASE, AMYLASE in the last 168 hours. No results for input(s): AMMONIA in the last 168 hours. CBC: Recent Labs  Lab 03/11/21 0629 03/12/21 0851 03/12/21 1504 03/14/21 0545 03/15/21 0739  WBC 4.1 6.0 6.2 6.0 10.0  HGB 9.1*   8.9* 8.9* 8.9* 8.4* 9.9*  HCT 28.4* 28.3* 27.5* 25.8* 29.5*  MCV 91.3 90.1 91.4 89.0 88.3  PLT 259 261 249 292 288   Cardiac  Enzymes: No results for input(s): CKTOTAL, CKMB, CKMBINDEX, TROPONINI in the last 168 hours. CBG: No results for input(s): GLUCAP in the last 168 hours.  Iron Studies: No results for input(s): IRON, TIBC, TRANSFERRIN, FERRITIN in the last 72 hours. Studies/Results: US BIOPSY (KIDNEY)  Result Date: 03/13/2021 INDICATION: 71 year old female with history of acute/subacute kidney injury. EXAM: ULTRASOUND GUIDED RENAL BIOPSY COMPARISON:  03/06/2021 MEDICATIONS: None. ANESTHESIA/SEDATION: Fentanyl 25 mcg IV; Versed 1 mg IV Total Moderate Sedation time: 11 minutes; The patient was continuously monitored during the procedure by the interventional radiology nurse under my direct supervision. COMPLICATIONS: None immediate. PROCEDURE: Informed written consent was obtained from the patient after a discussion of the risks, benefits and alternatives to treatment. The patient understands and consents the procedure. A timeout was performed prior to the initiation of the procedure. Ultrasound scanning was performed of the bilateral flanks. The inferior pole of the right kidney was selected for biopsy due to location and sonographic window.  The procedure was planned. The operative site was prepped and draped in the usual sterile fashion. The overlying soft tissues were anesthetized with 1% lidocaine with epinephrine. A 15 gauge coaxial introducer needle was advanced to the inferior cortex of the right kidney followed by introduction of a 16 gauge core needle biopsy device, then 2 core biopsies were obtained under direct ultrasound guidance. Images were saved for documentation purposes. The biopsy device was removed and hemostasis was obtained by injection of a Gel-Foam slurry along the needle tract under ultrasound guidance and manual compression. Post procedural scanning was negative for significant post procedural hemorrhage or additional complication. A dressing was placed. The patient tolerated the procedure well without  immediate post procedural complication. IMPRESSION: Technically successful ultrasound guided right renal biopsy. Ruthann Cancer, MD [facility] Radiology Electronically Signed   By: Ruthann Cancer M.D.   On: 03/13/2021 13:07    sodium chloride   Intravenous Once   amLODipine  10 mg Oral Daily   atorvastatin  40 mg Oral q1800   calcium-vitamin D  1 tablet Oral BID   famotidine  20 mg Oral Daily   ferrous sulfate  325 mg Oral Daily   pantoprazole  40 mg Oral Daily   predniSONE  60 mg Oral Q breakfast   sodium bicarbonate  1,300 mg Oral TID    BMET    Component Value Date/Time   NA 128 (L) 03/15/2021 0738   K 4.7 03/15/2021 0738   CL 99 03/15/2021 0738   CO2 19 (L) 03/15/2021 0738   GLUCOSE 95 03/15/2021 0738   BUN 106 (H) 03/15/2021 0738   CREATININE 5.88 (H) 03/15/2021 0738   CALCIUM 7.3 (L) 03/15/2021 0738   GFRNONAA 7 (L) 03/15/2021 0738   GFRAA >60 01/10/2018 1800   CBC    Component Value Date/Time   WBC 10.0 03/15/2021 0739   RBC 3.34 (L) 03/15/2021 0739   HGB 9.9 (L) 03/15/2021 0739   HGB 8.9 (L) 03/11/2021 0629   HCT 29.5 (L) 03/15/2021 0739   PLT 288 03/15/2021 0739   MCV 88.3 03/15/2021 0739   MCH 29.6 03/15/2021 0739   MCHC 33.6 03/15/2021 0739   RDW 15.9 (H) 03/15/2021 0739   LYMPHSABS 1.0 03/05/2021 0959   MONOABS 0.4 03/05/2021 0959   EOSABS 0.1 03/05/2021 0959   BASOSABS 0.0 03/05/2021 0959      Assessment/Plan:  AKI vs subacute renal failure- Scr started rising at the end of November and has continued to rise.  She is completely asymptomatic and no inciting event.  Pre-renal insults with entresto and spironolactone.  UA with some leukocytes and > 50 RBC. UP/C 0.41.  [place] without obstruction but increased echogenicity.  No history of stones.   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  Will order Rituximab 1 gram IV today and will need another dose in 2 weeks, then 4 months. Started H2 blocker and Ca+D with steroids.  G6PDH was normal so can use bactrim DS MWF for PCP prophylaxis planning. Renal biopsy - completed at [facility] 1/24 and consistent with crescentic, pauci-immune ANCA GN.   Anti-MPO Ab >8, P-ANCA 1:640 She is taking normal PO now so can  hold on fluids; resume PRN Continue to hold entresto and aldactone for now  No acute indication for dialysis  Cont to follow strict I/Os Discussed possible need for HD if her renal function continues to worsen, however she is not interested in HD and doesn't think she will need it.  Chronic diastolic and systolic CHF - note entresto and aldactone on hold.  Hold IVF today - volume status looks ok; can give diuetics PRN. A fib - per primary team.  She is on eliquis and this is on hold for renal biopsy.  Last dose was 1/20 PM. HTN -BP is currently stable Bradycardia - asymptomatic.  Will decrease amlodipine dose and follow.  Iron deficiency anemia - followed by hematology. S/p PRBC's on 1/19.  Hemoglobin stable posttransfusion Hypomag - s/p repletion, stable Hyponatremia - asymptomatic and likely due to AKI. Elevated serum free light chains ratio - noted.  Up/C 0.41. 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  [name], MD [employer] [phone]

## 2021-03-15 NOTE — Progress Notes (Signed)
PROGRESS NOTE    Mackenzie [name]  OFB:[id number] DOB: 1951-01-02 DOA: 03/06/2021 PCP: Dionisio Whitnie Deleon, MD   Brief Narrative:  This 71 years old female with PMH significant for systolic and diastolic CHF, hyperlipidemia, iron deficiency anemia, atrial fibrillation, GERD presented in the ED with abnormal labs.  Patient has been following up with her hematologist ,  routine work-up was obtained and showed serum creatinine 4.94 . Patient was advised by hematologist to get admitted for acute kidney injury. Patient is admitted for acute kidney injury, nephrology is consulted,  continued on IV hydration.  She is not a candidate for hemodialysis but patient will require renal biopsy.  She underwent random renal biopsy at [facility] 1/24.  Assessment & Plan:   Principal Problem:   Acute on chronic renal failure (HCC) Active Problems:   Chronic systolic heart failure (HCC)   HTN (hypertension)   Acute renal failure superimposed on stage 3a chronic kidney disease (HCC)  Acute kidney injury on chronic kidney disease 3A: Baseline serum creatinine 1.0-1.3. Creatinine on presentation 4.4  Scr started rising at the end of November and has continued to rise. She is completely asymptomatic and no inciting event.  Pre-renal insults with entresto and spironolactone.  UA with some leukocytes and > 50 RBC. UP/C 0.41.  [place] without obstruction but increased echogenicity.  No history of stones.  Per nephrology recs Serologies showing ANCA pANCA 1:640 -- sending ELISA; rest neg -- dsDNA neg, ASO neg, antiGBM neg and ANA neg, complements normal.  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  Will order Rituximab 1 gram IV today and will need another dose in 2 weeks, then 4 months. Started H2 blocker and Ca+D with steroids.  G6PDH was normal so can use bactrim DS MWF for PCP  prophylaxis planning. Renal biopsy - completed at [facility] 1/24 and consistent with crescentic, pauci-immune ANCA GN.   Anti-MPO Ab >8, P-ANCA 1:640 She is taking normal PO now so can hold on fluids; resume PRN Continue to hold entresto and aldactone for now  No acute indication for dialysis  Cont to follow strict I/Os Discussed possible need for HD if her renal function continues to worsen, however she is not interested in HD and doesn't think she will need it.   Chronic diastolic and systolic CHF: Clinically she appears euvolemic. Last echocardiogram 12/27/2020 shows LVEF 60 to 65%, no regional wall motion abnormalities. Continue to hold Entresto, spironolactone, continue carvedilol.  Atrial fibrillation: Heart rate controlled, Continue carvedilol.  Discontinued Eliquis 03/10/21 in anticipation for renal biopsy. Resume Eliquis today  Hyperlipidemia: Continue statins.  Essential hypertension: Continue carvedilol.  Iron deficiency anemia: Continue ferrous sulfate.  Hemoglobin  dropped to 7.1.   S/p 1 unit PRBC> Hb 7.4 >8.2 >9.2>8.9>9.9  Hyponatremia: Monitor, currently 128 Could be due to IV fluids.  DVT prophylaxis: SCDs, Eliquis on hold. Code Status: Full code. Family Communication: Daughter at bed side. Disposition Plan:   Status is: Inpatient  Anticipated  discharged when cleared by nephrology  Consultants:   Nephrology  Procedures:  None  Antimicrobials: None.  Subjective: Patient feels normal urinating normal no shortness of breath   objective: Vitals:   03/14/21 0755 03/14/21 1231 03/14/21 2123 03/15/21 0518  BP: 128/66 113/61 119/61 129/71  Pulse: (!) 56 (!) 58 66 (!) 57  Resp: 18 18 16  18  Temp:  (!) 97.3 F (36.3 C) (!) 97.5 F (36.4 C) 98.2 F (36.8 C)  TempSrc:  Oral Oral Oral  SpO2: 100% 100% 99% 100%  Weight:      Height:        Intake/Output Summary (Last 24 hours) at 03/15/2021 1005 Last data filed at 03/14/2021 1900 Gross per 24 hour   Intake 480 ml  Output --  Net 480 ml   Filed Weights   03/06/21 1008 03/06/21 1531  Weight: 75.5 kg 76.2 kg    Examination:  General exam: Appears comfortable, deconditioned, not in any acute distress. Respiratory system: Clear to auscultation bilaterally. Respiratory effort normal.  RR 14 Cardiovascular system: S1-S2 heard, regular rate, irregular rhythm, no murmur. Gastrointestinal system: Abdomen is soft, nontender, nondistended, BS+ Central nervous system: Alert and oriented X 2. No focal neurological deficits. Extremities: +Edema, no cyanosis, no clubbing. Skin: No rashes, lesions or ulcers Psychiatry: Judgement and insight appear normal. Mood & affect appropriate.     Data Reviewed: I have personally reviewed following labs and imaging studies  CBC: Recent Labs  Lab 03/11/21 0629 03/12/21 0851 03/12/21 1504 03/14/21 0545 03/15/21 0739  WBC 4.1 6.0 6.2 6.0 10.0  HGB 9.1*   8.9* 8.9* 8.9* 8.4* 9.9*  HCT 28.4* 28.3* 27.5* 25.8* 29.5*  MCV 91.3 90.1 91.4 89.0 88.3  PLT 259 261 249 292 284   Basic Metabolic Panel: Recent Labs  Lab 03/09/21 0513 03/10/21 0437 03/11/21 0629 03/12/21 0440 03/12/21 0851 03/13/21 0623 03/14/21 0545 03/15/21 0738  NA 139 138 134* 130*   130*  --  127* 128* 128*  K 5.1 5.0 5.1 5.6*   5.8* 5.2* 4.8 5.0 4.7  CL 109 110 106 103   103  --  101 100 99  CO2 20* 20* 18* 15*   15*  --  16* 16* 19*  GLUCOSE 80 89 149* 143*   145*  --  140* 128* 95  BUN 55* 56* 63* 73*   73*  --  91* 98* 106*  CREATININE 4.78* 5.10* 5.20* 5.14*   5.12*  --  5.73* 5.80* 5.88*  CALCIUM 8.2* 8.2* 8.7* 8.7*   8.5*  --  8.2* 7.7* 7.3*  MG 1.7 1.6* 1.9 2.0  --   --  1.7  --   PHOS 4.7* 5.2*   5.3* 5.1*   5.1* 5.6*   5.5*  --  5.8* 5.5*   5.5* 5.5*   GFR: Estimated Creatinine Clearance: 9.3 mL/min (A) (by C-G formula based on SCr of 5.88 mg/dL (H)). Liver Function Tests: Recent Labs  Lab 03/11/21 0629 03/12/21 0440 03/13/21 0623 03/14/21 0545  03/15/21 0738  ALBUMIN 3.0* 3.0* 3.1* 2.6* 2.7*   No results for input(s): LIPASE, AMYLASE in the last 168 hours. No results for input(s): AMMONIA in the last 168 hours. Coagulation Profile: Recent Labs  Lab 03/12/21 1504  INR 1.2   Cardiac Enzymes: No results for input(s): CKTOTAL, CKMB, CKMBINDEX, TROPONINI in the last 168 hours.  BNP (last 3 results) No results for input(s): PROBNP in the last 8760 hours. HbA1C: No results for input(s): HGBA1C in the last 72 hours. CBG: No results for input(s): GLUCAP in the last 168 hours. Lipid Profile: No results for input(s): CHOL, HDL, LDLCALC, TRIG, CHOLHDL, LDLDIRECT in the last 72 hours. Thyroid Function Tests: No results for input(s): TSH, T4TOTAL, FREET4, T3FREE, THYROIDAB in the last 72 hours. Anemia Panel: No results for input(s): VITAMINB12, FOLATE, FERRITIN, TIBC, IRON, RETICCTPCT in the  last 72 hours.  Sepsis Labs: No results for input(s): PROCALCITON, LATICACIDVEN in the last 168 hours.  Recent Results (from the past 240 hour(s))  Urine Culture     Status: None   Collection Time: 03/06/21 10:44 AM   Specimen: Urine, Clean Catch  Result Value Ref Range Status   Specimen Description   Final    URINE, CLEAN CATCH Performed at [facility], [address], [place], [place] [address]    Special Requests   Final    NONE Performed at [facility], [address] [place], [place] [address]    Culture   Final    NO GROWTH Performed at [facility], [place] [address] [place], [place] [address]    Report Status 03/08/2021 FINAL  Final  Resp Panel by RT-PCR (Flu A&B, Covid) Nasopharyngeal Swab     Status: None   Collection Time: 03/06/21  1:11 PM   Specimen: Nasopharyngeal Swab; Nasopharyngeal(NP) swabs in vial transport medium  Result Value Ref Range Status   SARS Coronavirus 2 by RT PCR NEGATIVE NEGATIVE Final    Comment: (NOTE) SARS-CoV-2 target nucleic acids are NOT DETECTED.  The SARS-CoV-2 RNA is generally  detectable in upper respiratory specimens during the acute phase of infection. The lowest concentration of SARS-CoV-2 viral copies this assay can detect is 138 copies/mL. A negative result does not preclude SARS-Cov-2 infection and should not be used as the sole basis for treatment or other patient management decisions. A negative result may occur with  improper specimen collection/handling, submission of specimen other than nasopharyngeal swab, presence of viral mutation(s) within the areas targeted by this assay, and inadequate number of viral copies(<138 copies/mL). A negative result must be combined with clinical observations, patient history, and epidemiological information. The expected result is Negative.  Fact Sheet for Patients:  EntrepreneurPulse.com.au  Fact Sheet for Healthcare Providers:  IncredibleEmployment.be  This test is no t yet approved or cleared by the [place] FDA and  has been authorized for detection and/or diagnosis of SARS-CoV-2 by FDA under an Emergency Use Authorization (EUA). This EUA will remain  in effect (meaning this test can be used) for the duration of the COVID-19 declaration under Section 564(b)(1) of the Act, 21 U.S.C.section 360bbb-3(b)(1), unless the authorization is terminated  or revoked sooner.       Influenza A by PCR NEGATIVE NEGATIVE Final   Influenza B by PCR NEGATIVE NEGATIVE Final    Comment: (NOTE) The Xpert Xpress SARS-CoV-2/FLU/RSV plus assay is intended as an aid in the diagnosis of influenza from Nasopharyngeal swab specimens and should not be used as a sole basis for treatment. Nasal washings and aspirates are unacceptable for Xpert Xpress SARS-CoV-2/FLU/RSV testing.  Fact Sheet for Patients: EntrepreneurPulse.com.au  Fact Sheet for Healthcare Providers: IncredibleEmployment.be  This test is not yet approved or cleared by the [place] FDA  and has been authorized for detection and/or diagnosis of SARS-CoV-2 by FDA under an Emergency Use Authorization (EUA). This EUA will remain in effect (meaning this test can be used) for the duration of the COVID-19 declaration under Section 564(b)(1) of the Act, 21 U.S.C. section 360bbb-3(b)(1), unless the authorization is terminated or revoked.  Performed at [facility], [address] [place], [place] [address]     Radiology Studies: US BIOPSY (KIDNEY)  Result Date: 03/13/2021 INDICATION: 71 year old female with history of acute/subacute kidney injury. EXAM: ULTRASOUND GUIDED RENAL BIOPSY COMPARISON:  03/06/2021 MEDICATIONS: None. ANESTHESIA/SEDATION: Fentanyl 25 mcg IV; Versed 1 mg IV Total  Moderate Sedation time: 11 minutes; The patient was continuously monitored during the procedure by the interventional radiology nurse under my direct supervision. COMPLICATIONS: None immediate. PROCEDURE: Informed written consent was obtained from the patient after a discussion of the risks, benefits and alternatives to treatment. The patient understands and consents the procedure. A timeout was performed prior to the initiation of the procedure. Ultrasound scanning was performed of the bilateral flanks. The inferior pole of the right kidney was selected for biopsy due to location and sonographic window. The procedure was planned. The operative site was prepped and draped in the usual sterile fashion. The overlying soft tissues were anesthetized with 1% lidocaine with epinephrine. A 15 gauge coaxial introducer needle was advanced to the inferior cortex of the right kidney followed by introduction of a 16 gauge core needle biopsy device, then 2 core biopsies were obtained under direct ultrasound guidance. Images were saved for documentation purposes. The biopsy device was removed and hemostasis was obtained by injection of a Gel-Foam slurry along the needle tract under ultrasound guidance and manual compression.  Post procedural scanning was negative for significant post procedural hemorrhage or additional complication. A dressing was placed. The patient tolerated the procedure well without immediate post procedural complication. IMPRESSION: Technically successful ultrasound guided right renal biopsy. Ruthann Cancer, MD [facility] Radiology Electronically Signed   By: Ruthann Cancer M.D.   On: 03/13/2021 13:07    Scheduled Meds:  sodium chloride   Intravenous Once   acetaminophen  650 mg Oral Once   amLODipine  5 mg Oral Daily   atorvastatin  40 mg Oral q1800   calcium-vitamin D  1 tablet Oral BID   diphenhydrAMINE  50 mg Oral Once   famotidine  20 mg Oral Daily   ferrous sulfate  325 mg Oral Daily   pantoprazole  40 mg Oral Daily   predniSONE  60 mg Oral Q breakfast   sodium bicarbonate  1,300 mg Oral TID   [START ON 03/16/2021] sulfamethoxazole-trimethoprim  1 tablet Oral Q M,W,F   Continuous Infusions:  famotidine (PEPCID) IV     riTUXimab (RITUXAN)  NON-ONCOLOGY  infusion     sodium chloride        LOS: 9 days      Eivin Mascio A, MD [facility]

## 2021-03-16 LAB — RENAL FUNCTION PANEL
Albumin: 2.5 g/dL — ABNORMAL LOW (ref 3.5–5.0)
Anion gap: 11 (ref 5–15)
BUN: 110 mg/dL — ABNORMAL HIGH (ref 8–23)
CO2: 19 mmol/L — ABNORMAL LOW (ref 22–32)
Calcium: 7.3 mg/dL — ABNORMAL LOW (ref 8.9–10.3)
Chloride: 96 mmol/L — ABNORMAL LOW (ref 98–111)
Creatinine, Ser: 5.36 mg/dL — ABNORMAL HIGH (ref 0.44–1.00)
GFR, Estimated: 8 mL/min — ABNORMAL LOW (ref >60)
Glucose, Bld: 106 mg/dL — ABNORMAL HIGH (ref 70–99)
Phosphorus: 4.8 mg/dL — ABNORMAL HIGH (ref 2.5–4.6)
Potassium: 4.6 mmol/L (ref 3.5–5.1)
Sodium: 126 mmol/L — ABNORMAL LOW (ref 135–145)

## 2021-03-16 LAB — CBC
HCT: 28.2 % — ABNORMAL LOW (ref 36.0–46.0)
Hemoglobin: 9.2 g/dL — ABNORMAL LOW (ref 12.0–15.0)
MCH: 28 pg (ref 26.0–34.0)
MCHC: 32.6 g/dL (ref 30.0–36.0)
MCV: 86 fL (ref 80.0–100.0)
Platelets: 247 10*3/uL (ref 150–400)
RBC: 3.28 MIL/uL — ABNORMAL LOW (ref 3.87–5.11)
RDW: 15.7 % — ABNORMAL HIGH (ref 11.5–15.5)
WBC: 7 10*3/uL (ref 4.0–10.5)
nRBC: 0 % (ref 0.0–0.2)

## 2021-03-16 MED ORDER — PREDNISONE 20 MG PO TABS: 60.0000 mg | ORAL_TABLET | Freq: Every day | ORAL | 0 refills | Status: AC

## 2021-03-16 MED ORDER — SULFAMETHOXAZOLE-TRIMETHOPRIM 800-160 MG PO TABS: 1.0000 | ORAL_TABLET | ORAL | 0 refills | Status: AC

## 2021-03-16 MED ORDER — AMLODIPINE BESYLATE 5 MG PO TABS: 5.0000 mg | ORAL_TABLET | Freq: Every day | ORAL | 0 refills | Status: DC

## 2021-03-16 NOTE — Discharge Summary (Signed)
Physician Discharge Summary  Patient ID: Mackenzie [name] MRN: [id number] DOB/AGE: 71-Jan-1952 71 y.o.  Admit date: 03/06/2021 Discharge date: 03/16/2021  Admission Diagnoses:  Discharge Diagnoses:  Principal Problem:   Acute on chronic renal failure ([place]) Active Problems:   Chronic systolic heart failure (HCC)   HTN (hypertension)   Acute renal failure superimposed on stage 3a chronic kidney disease ([place])   Discharged Condition: stable  Hospital Course: This 71 years old female with PMH significant for systolic and diastolic CHF, hyperlipidemia, iron deficiency anemia, atrial fibrillation, GERD presented in the ED with abnormal labs.  Patient has been following up with her hematologist ,  routine work-up was obtained and showed serum creatinine 4.94 . Patient was advised by hematologist to get admitted for acute kidney injury. Patient is admitted for acute kidney injury, nephrology is consulted,  continued on IV hydration.  She is not a candidate for hemodialysis but patient will require renal biopsy.  She underwent random renal biopsy at [facility] 1/24.  Acute kidney injury on chronic kidney disease 3A: Baseline serum creatinine 1.0-1.3. Creatinine on presentation 4.4  Scr started rising at the end of November and has continued to rise. She is completely asymptomatic and no inciting event.  Pre-renal insults with entresto and spironolactone.  UA with some leukocytes and > 50 RBC. UP/C 0.41.  [place] without obstruction but increased echogenicity.  No history of stones.  Per nephrology recs Serologies showing ANCA pANCA 1:640 -- sending ELISA; rest neg -- dsDNA neg, ASO neg, antiGBM neg and ANA neg, complements normal.  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  Rituximab 1 gram IV given and will need another dose in 2  weeks, then 4 months. Started H2 blocker and Ca+D with steroids.  G6PDH was normal so can use bactrim DS MWF for PCP prophylaxis planning. Renal biopsy - completed at [facility] 1/24 and consistent with crescentic, pauci-immune ANCA GN.   Anti-MPO Ab >8, P-ANCA 1:640 Continue to hold entresto and aldactone for now  No acute indication for dialysis  Cont to follow strict I/Os Discussed possible need for HD if her renal function continues to worsen, however she is not interested in HD and doesn't think she will need it.    Chronic diastolic and systolic CHF: Clinically she appears euvolemic. Last echocardiogram 12/27/2020 shows LVEF 60 to 65%, no regional wall motion abnormalities. Continue to hold Entresto, spironolactone, continue carvedilol.   Atrial fibrillation: Heart rate controlled, Continue carvedilol.  Discontinued Eliquis 03/10/21 in anticipation for renal biopsy. Resume Eliquis at discharge   Hyperlipidemia: Continue statins.   Essential hypertension: Continue carvedilol.   Iron deficiency anemia: Continue ferrous sulfate.  Hemoglobin  dropped to 7.1.   S/p 1 unit PRBC> Hb 7.4 >8.2 >9.2>8.9>9.9   Hyponatremia: Monitor, currently 128 Could be due to IV fluids.   Discharge Exam: Blood pressure 129/69, pulse (!) 59, temperature 98 F (36.7 C), temperature source Oral, resp. rate 20, height $RemoveBe'5\' 6"'PCGqeNaZy$  (1.676 m), weight 76.2 kg, SpO2 100 %. General appearance: alert and cooperative Head: Normocephalic, without obvious abnormality, atraumatic Resp: clear to auscultation bilaterally Cardio: regular rate and rhythm, S1, S2 normal, no murmur, click, rub or gallop GI: soft, non-tender; bowel sounds normal; no masses,  no organomegaly Extremities: extremities normal, atraumatic, no cyanosis or edema Pulses: 2+ and symmetric Skin:  Skin color, texture, turgor normal. No rashes or lesions  Disposition: Discharge disposition: 01-Home or Self Care       Discharge Instructions      Diet - low sodium heart healthy   Complete by: As directed    Discharge instructions   Complete by: As directed    Follow-up with primary care physician in 1 to 2 weeks  Follow-up with nephrology February 6 as scheduled   Increase activity slowly   Complete by: As directed    No wound care   Complete by: As directed       Allergies as of 03/16/2021   No Known Allergies      Medication List     STOP taking these medications    carvedilol 12.5 MG tablet Commonly known as: COREG   sacubitril-valsartan 24-26 MG Commonly known as: ENTRESTO   spironolactone 25 MG tablet Commonly known as: ALDACTONE       TAKE these medications    albuterol 108 (90 Base) MCG/ACT inhaler Commonly known as: VENTOLIN HFA Inhale 2 puffs into the lungs daily as needed for wheezing or shortness of breath.   amLODipine 5 MG tablet Commonly known as: NORVASC Take 1 tablet (5 mg total) by mouth daily. Start taking on: March 17, 2021   apixaban 5 MG Tabs tablet Commonly known as: ELIQUIS Take 1 tablet (5 mg total) by mouth 2 (two) times daily.   atorvastatin 40 MG tablet Commonly known as: LIPITOR Take 1 tablet (40 mg total) by mouth daily at 6 PM.   blood glucose meter kit and supplies Kit Dispense based on patient and insurance preference. Use up to four times daily as directed.   ferrous sulfate 325 (65 FE) MG tablet Take 1 tablet (325 mg total) by mouth daily.   guaiFENesin-dextromethorphan 100-10 MG/5ML syrup Commonly known as: ROBITUSSIN DM Take 5 mLs by mouth every 4 (four) hours as needed for cough.   pantoprazole 40 MG tablet Commonly known as: Protonix Take 1 tablet (40 mg total) by mouth daily.   predniSONE 20 MG tablet Commonly known as: DELTASONE Take 3 tablets (60 mg total) by mouth daily with breakfast for 10 days. Start taking on: March 17, 2021   sulfamethoxazole-trimethoprim 800-160 MG tablet Commonly known as: BACTRIM DS Take 1 tablet by mouth every  Monday, Wednesday, and Friday. Start taking on: March 19, 2021         Signed: Ioan Landini A 03/16/2021, 10:04 AM

## 2021-03-16 NOTE — Progress Notes (Addendum)
Patient ID: Mackenzie [name], female   DOB: 1950-05-31, 71 y.o.   MRN: [id number] S: Feels well, no complaints.  Tolerated rituximab infusion without issue yesterday. O:BP 129/69 (BP Location: Left Arm)    Pulse (!) 59    Temp 98 F (36.7 C) (Oral)    Resp 20    Ht $R'5\' 6"'fG$  (1.676 m)    Wt 76.2 kg    SpO2 100%    BMI 27.11 kg/m   Intake/Output Summary (Last 24 hours) at 03/16/2021 4742 Last data filed at 03/16/2021 0800 Gross per 24 hour  Intake 720 ml  Output 600 ml  Net 120 ml   Intake/Output: I/O last 3 completed shifts: In: 39 [P.O.:720] Out: 300 [Urine:300]  Intake/Output this shift:  Total I/O In: -  Out: 300 [Urine:300] Weight change:  Gen:NAD CVS: bradycardic at 59, no rub Resp: CTA Abd: +BS, soft, NT/ND Ext: no edema  Recent Labs  Lab 03/10/21 0437 03/11/21 0629 03/12/21 0440 03/12/21 0851 03/13/21 0623 03/14/21 0545 03/15/21 0738 03/16/21 0607  NA 138 134* 130*   130*  --  127* 128* 128* 126*  K 5.0 5.1 5.6*   5.8* 5.2* 4.8 5.0 4.7 4.6  CL 110 106 103   103  --  101 100 99 96*  CO2 20* 18* 15*   15*  --  16* 16* 19* 19*  GLUCOSE 89 149* 143*   145*  --  140* 128* 95 106*  BUN 56* 63* 73*   73*  --  91* 98* 106* 110*  CREATININE 5.10* 5.20* 5.14*   5.12*  --  5.73* 5.80* 5.88* 5.36*  ALBUMIN 2.9* 3.0* 3.0*  --  3.1* 2.6* 2.7* 2.5*  CALCIUM 8.2* 8.7* 8.7*   8.5*  --  8.2* 7.7* 7.3* 7.3*  PHOS 5.2*   5.3* 5.1*   5.1* 5.6*   5.5*  --  5.8* 5.5*   5.5* 5.5* 4.8*   Liver Function Tests: Recent Labs  Lab 03/14/21 0545 03/15/21 0738 03/16/21 0607  ALBUMIN 2.6* 2.7* 2.5*   No results for input(s): LIPASE, AMYLASE in the last 168 hours. No results for input(s): AMMONIA in the last 168 hours. CBC: Recent Labs  Lab 03/12/21 0851 03/12/21 1504 03/14/21 0545 03/15/21 0739 03/16/21 0607  WBC 6.0 6.2 6.0 10.0 7.0  HGB 8.9* 8.9* 8.4* 9.9* 9.2*  HCT 28.3* 27.5* 25.8* 29.5* 28.2*  MCV 90.1 91.4 89.0 88.3 86.0  PLT 261 249 292 288 247   Cardiac Enzymes: No  results for input(s): CKTOTAL, CKMB, CKMBINDEX, TROPONINI in the last 168 hours. CBG: No results for input(s): GLUCAP in the last 168 hours.  Iron Studies: No results for input(s): IRON, TIBC, TRANSFERRIN, FERRITIN in the last 72 hours. Studies/Results: No results found.  sodium chloride   Intravenous Once   amLODipine  5 mg Oral Daily   apixaban  5 mg Oral BID   atorvastatin  40 mg Oral q1800   calcium-vitamin D  1 tablet Oral BID   famotidine  20 mg Oral Daily   ferrous sulfate  325 mg Oral Daily   pantoprazole  40 mg Oral Daily   predniSONE  60 mg Oral Q breakfast   sodium bicarbonate  1,300 mg Oral TID   sulfamethoxazole-trimethoprim  1 tablet Oral Q M,W,F    BMET    Component Value Date/Time   NA 126 (L) 03/16/2021 0607   K 4.6 03/16/2021 0607   CL 96 (L) 03/16/2021 0607   CO2 19 (  L) 03/16/2021 0607   GLUCOSE 106 (H) 03/16/2021 0607   BUN 110 (H) 03/16/2021 0607   CREATININE 5.36 (H) 03/16/2021 0607   CALCIUM 7.3 (L) 03/16/2021 0607   GFRNONAA 8 (L) 03/16/2021 0607   GFRAA >60 01/10/2018 1800   CBC    Component Value Date/Time   WBC 7.0 03/16/2021 0607   RBC 3.28 (L) 03/16/2021 0607   HGB 9.2 (L) 03/16/2021 0607   HGB 8.9 (L) 03/11/2021 0629   HCT 28.2 (L) 03/16/2021 0607   PLT 247 03/16/2021 0607   MCV 86.0 03/16/2021 0607   MCH 28.0 03/16/2021 0607   MCHC 32.6 03/16/2021 0607   RDW 15.7 (H) 03/16/2021 0607   LYMPHSABS 1.0 03/05/2021 0959   MONOABS 0.4 03/05/2021 0959   EOSABS 0.1 03/05/2021 0959   BASOSABS 0.0 03/05/2021 0959    Assessment/Plan:  AKI vs subacute renal failure- Scr started rising at the end of November and has continued to rise.  She is completely asymptomatic and no inciting event.  Pre-renal insults with entresto and spironolactone.  UA with some leukocytes and > 50 RBC. UP/C 0.41.  [place] without obstruction but increased echogenicity.  No history of stones.   Serologies showing ANCA pANCA 1:640 -- sending ELISA; rest neg -- dsDNA neg, ASO  neg, antiGBM neg and ANA neg, complements normal.  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  Received Rituximab 1 gram IV on 03/15/21 and will need another dose in 2 weeks, then 4 months.  These will be arranged by our office. Started H2 blocker and Ca+D with steroids.  G6PDH was normal so can use bactrim DS one po MWF for PCP prophylaxis. Renal biopsy - completed at [facility] 1/24 and consistent with crescentic, pauci-immune ANCA GN.   Anti-MPO Ab >8, P-ANCA 1:640 Continue to hold entresto and aldactone for now  No acute indication for dialysis  Cont to follow strict I/Os Discussed possible need for HD if her renal function continues to worsen, however she is not interested in HD and doesn't think she will need it.  Follow up with me on 03/26/21 at 11:20 am at [facility] at [address], [place], [place] [address].  (Can't fit her in our [place] office on Monday).  Office number [phone]. Chronic diastolic and systolic CHF - note entresto and aldactone on hold.  Hold IVF today - volume status looks ok; can give diuetics PRN. A fib - per primary team.  She is on eliquis and this is on hold for renal biopsy.  Last dose was 1/20 PM. HTN -BP is currently stable Bradycardia - asymptomatic.  Improved with decrease amlodipine dose.  Iron deficiency anemia - followed by hematology. S/p PRBC's on 1/19.  Hemoglobin stable posttransfusion Hypomag - s/p repletion, stable Hyponatremia - asymptomatic and likely due to AKI. Elevated serum free light chains ratio - noted.  Up/C 0.41. 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 Disposition - stable for discharge with above f/u plans.   Mackenzie Potts, MD [employer] [phone]

## 2021-03-30 ENCOUNTER — Other Ambulatory Visit (HOSPITAL_COMMUNITY): Payer: Self-pay

## 2021-04-02 ENCOUNTER — Ambulatory Visit (HOSPITAL_COMMUNITY)
Admission: RE | Admit: 2021-04-02 | Discharge: 2021-04-02 | Disposition: A | Discharge status: Home / Self Care | Payer: Medicare Other | Source: Ambulatory Visit | Attending: Nephrology | Admitting: Nephrology

## 2021-04-02 DIAGNOSIS — N057 Unspecified nephritic syndrome with diffuse crescentic glomerulonephritis: Secondary | ICD-10-CM | POA: Insufficient documentation

## 2021-04-02 DIAGNOSIS — N058 Unspecified nephritic syndrome with other morphologic changes: Secondary | ICD-10-CM | POA: Insufficient documentation

## 2021-04-02 MED ORDER — SODIUM CHLORIDE 0.9 % IV SOLN
1000.0000 mg | Freq: Once | INTRAVENOUS | Status: AC
  Administered 2021-04-02: 1000 mg via INTRAVENOUS
  Filled 2021-04-02: qty 100

## 2021-04-02 MED ORDER — ACETAMINOPHEN 325 MG PO TABS
ORAL_TABLET | ORAL | Status: AC
  Administered 2021-04-02: 650 mg via ORAL
  Filled 2021-04-02: qty 2

## 2021-04-02 MED ORDER — DIPHENHYDRAMINE HCL 50 MG/ML IJ SOLN: 25.0000 mg | Freq: Once | INTRAMUSCULAR | Status: AC

## 2021-04-02 MED ORDER — METHYLPREDNISOLONE SODIUM SUCC 125 MG IJ SOLR: 125.0000 mg | Freq: Once | INTRAMUSCULAR | Status: AC

## 2021-04-02 MED ORDER — DIPHENHYDRAMINE HCL 50 MG/ML IJ SOLN
INTRAMUSCULAR | Status: AC
  Administered 2021-04-02: 25 mg via INTRAVENOUS
  Filled 2021-04-02: qty 1

## 2021-04-02 MED ORDER — ACETAMINOPHEN 325 MG PO TABS: 650.0000 mg | ORAL_TABLET | Freq: Once | ORAL | Status: AC

## 2021-04-02 MED ORDER — METHYLPREDNISOLONE SODIUM SUCC 125 MG IJ SOLR
INTRAMUSCULAR | Status: AC
  Administered 2021-04-02: 125 mg via INTRAVENOUS
  Filled 2021-04-02: qty 2

## 2021-05-07 ENCOUNTER — Ambulatory Visit (INDEPENDENT_AMBULATORY_CARE_PROVIDER_SITE_OTHER): Payer: Medicare Other | Admitting: Gastroenterology

## 2021-05-07 ENCOUNTER — Emergency Department (HOSPITAL_COMMUNITY)
Admission: EM | Admit: 2021-05-07 | Discharge: 2021-05-07 | Disposition: A | Discharge status: Home / Self Care | Payer: Medicare Other | Attending: Physician Assistant | Admitting: Physician Assistant

## 2021-05-07 ENCOUNTER — Emergency Department (HOSPITAL_COMMUNITY): Payer: Medicare Other

## 2021-05-07 ENCOUNTER — Encounter (INDEPENDENT_AMBULATORY_CARE_PROVIDER_SITE_OTHER): Payer: Self-pay | Admitting: Gastroenterology

## 2021-05-07 ENCOUNTER — Encounter (HOSPITAL_COMMUNITY): Payer: Self-pay | Admitting: *Deleted

## 2021-05-07 DIAGNOSIS — N390 Urinary tract infection, site not specified: Secondary | ICD-10-CM | POA: Insufficient documentation

## 2021-05-07 DIAGNOSIS — R0602 Shortness of breath: Secondary | ICD-10-CM | POA: Insufficient documentation

## 2021-05-07 LAB — CBC WITH DIFFERENTIAL/PLATELET
Abs Immature Granulocytes: 0.15 10*3/uL — ABNORMAL HIGH (ref 0.00–0.07)
Basophils Absolute: 0 10*3/uL (ref 0.0–0.1)
Basophils Relative: 0 %
Eosinophils Absolute: 0 10*3/uL (ref 0.0–0.5)
Eosinophils Relative: 0 %
HCT: 32.4 % — ABNORMAL LOW (ref 36.0–46.0)
Hemoglobin: 9.4 g/dL — ABNORMAL LOW (ref 12.0–15.0)
Immature Granulocytes: 2 %
Lymphocytes Relative: 13 %
Lymphs Abs: 0.9 10*3/uL (ref 0.7–4.0)
MCH: 29.8 pg (ref 26.0–34.0)
MCHC: 29 g/dL — ABNORMAL LOW (ref 30.0–36.0)
MCV: 102.9 fL — ABNORMAL HIGH (ref 80.0–100.0)
Monocytes Absolute: 0.4 10*3/uL (ref 0.1–1.0)
Monocytes Relative: 7 %
Neutro Abs: 4.9 10*3/uL (ref 1.7–7.7)
Neutrophils Relative %: 78 %
Platelets: 134 10*3/uL — ABNORMAL LOW (ref 150–400)
RBC: 3.15 MIL/uL — ABNORMAL LOW (ref 3.87–5.11)
RDW: 18.5 % — ABNORMAL HIGH (ref 11.5–15.5)
WBC: 6.4 10*3/uL (ref 4.0–10.5)
nRBC: 0 % (ref 0.0–0.2)

## 2021-05-07 LAB — COMPREHENSIVE METABOLIC PANEL
ALT: 38 U/L (ref 0–44)
AST: 25 U/L (ref 15–41)
Albumin: 3.5 g/dL (ref 3.5–5.0)
Alkaline Phosphatase: 68 U/L (ref 38–126)
Anion gap: 16 — ABNORMAL HIGH (ref 5–15)
BUN: 90 mg/dL — ABNORMAL HIGH (ref 8–23)
CO2: 12 mmol/L — ABNORMAL LOW (ref 22–32)
Calcium: 7.2 mg/dL — ABNORMAL LOW (ref 8.9–10.3)
Chloride: 113 mmol/L — ABNORMAL HIGH (ref 98–111)
Creatinine, Ser: 3.47 mg/dL — ABNORMAL HIGH (ref 0.44–1.00)
GFR, Estimated: 14 mL/min — ABNORMAL LOW (ref >60)
Glucose, Bld: 101 mg/dL — ABNORMAL HIGH (ref 70–99)
Potassium: 3.7 mmol/L (ref 3.5–5.1)
Sodium: 141 mmol/L (ref 135–145)
Total Bilirubin: 0.6 mg/dL (ref 0.3–1.2)
Total Protein: 6 g/dL — ABNORMAL LOW (ref 6.5–8.1)

## 2021-05-07 LAB — URINALYSIS, ROUTINE W REFLEX MICROSCOPIC
Bilirubin Urine: NEGATIVE
Glucose, UA: NEGATIVE mg/dL
Ketones, ur: NEGATIVE mg/dL
Nitrite: NEGATIVE
Protein, ur: 30 mg/dL — AB
RBC / HPF: >50 RBC/hpf — ABNORMAL HIGH (ref 0–5)
Specific Gravity, Urine: 1.011 (ref 1.005–1.030)
pH: 6 (ref 5.0–8.0)

## 2021-05-07 MED ORDER — CEPHALEXIN 500 MG PO CAPS: 500.0000 mg | ORAL_CAPSULE | Freq: Three times a day (TID) | ORAL | 0 refills | Status: AC

## 2021-05-07 MED ORDER — CEPHALEXIN 500 MG PO CAPS
500.0000 mg | ORAL_CAPSULE | Freq: Once | ORAL | Status: AC
Start: 2021-05-07 — End: 2021-05-07
  Administered 2021-05-07: 500 mg via ORAL
  Filled 2021-05-07: qty 1

## 2021-05-07 NOTE — ED Triage Notes (Signed)
Multiple complaints, swelling of feet and shortness of breath, denies pain ?

## 2021-05-07 NOTE — Discharge Instructions (Addendum)
Return if any problems.

## 2021-05-07 NOTE — ED Provider Notes (Signed)
?[place] ?Provider Note ? ? ?CSN: [id number] ?Arrival date & time: 05/07/21  1153 ? ?  ? ?History ? ?Chief Complaint  ?Patient presents with  ? Shortness of Breath  ? ? ?Mackenzie [name] is a 71 y.o. female. ? ?Patient is here with the daughter patient complains of swelling in her feet patient also reports that she becomes short of breath with any exertion.  Since daughter reports patient was diagnosed with kidney disease in January and was admitted to the hospital.  Patient has been followed by the nephrologist in [place].  Patient reports she feels well except for feet swelling ? ?The history is provided by the patient. No language interpreter was used.  ?Shortness of Breath ?Severity:  Mild ?Onset quality:  Gradual ? ?  ? ?Home Medications ?Prior to Admission medications   ?Medication Sig Start Date End Date Taking? Authorizing Provider  ?cephALEXin (KEFLEX) 500 MG capsule Take 1 capsule (500 mg total) by mouth 3 (three) times daily for 21 days. 05/07/21 05/28/21 Yes Fransico Meadow, PA-C  ?albuterol (PROVENTIL HFA;VENTOLIN HFA) 108 (90 Base) MCG/ACT inhaler Inhale 2 puffs into the lungs daily as needed for wheezing or shortness of breath. 06/11/16   Alisa Graff, FNP  ?amLODipine (NORVASC) 5 MG tablet Take 1 tablet (5 mg total) by mouth daily. 03/17/21   Phillips Grout, MD  ?apixaban (ELIQUIS) 5 MG TABS tablet Take 1 tablet (5 mg total) by mouth 2 (two) times daily. 06/11/16   Alisa Graff, FNP  ?atorvastatin (LIPITOR) 40 MG tablet Take 1 tablet (40 mg total) by mouth daily at 6 PM. 06/11/16   Alisa Graff, FNP  ?blood glucose meter kit and supplies KIT Dispense based on patient and insurance preference. Use up to four times daily as directed. 01/20/21   Manuella Ghazi, Pratik D, DO  ?ENTRESTO 97-103 MG Take 1 tablet by mouth 2 (two) times daily. 03/30/21   [provider]  ?ferrous sulfate 325 (65 FE) MG tablet Take 1 tablet (325 mg total) by mouth daily. 01/20/21 01/20/22  Manuella Ghazi, Pratik  D, DO  ?guaiFENesin-dextromethorphan (ROBITUSSIN DM) 100-10 MG/5ML syrup Take 5 mLs by mouth every 4 (four) hours as needed for cough. 01/20/21   Manuella Ghazi, Pratik D, DO  ?pantoprazole (PROTONIX) 40 MG tablet Take 1 tablet (40 mg total) by mouth daily. 01/20/21 01/20/22  Manuella Ghazi, Pratik D, DO  ?predniSONE (DELTASONE) 20 MG tablet Take 60 mg by mouth daily. 05/04/21   [provider]  ?spironolactone (ALDACTONE) 25 MG tablet Take 25 mg by mouth daily. 04/06/21   [provider]  ?   ? ?Allergies    ?Patient has no known allergies.   ? ?Review of Systems   ?Review of Systems  ?Respiratory:  Positive for shortness of breath.   ?All other systems reviewed and are negative. ? ?Physical Exam ?Updated Vital Signs ?BP (!) 143/75   Pulse 84   Temp 97.7 ?F (36.5 ?C) (Oral)   Resp (!) 21   Ht 5' 6" (1.676 m)   Wt 78.9 kg   SpO2 100%   BMI 28.08 kg/m?  ?Physical Exam ?Vitals and nursing note reviewed.  ?Constitutional:   ?   Appearance: She is well-developed.  ?HENT:  ?   Head: Normocephalic.  ?Eyes:  ?   Pupils: Pupils are equal, round, and reactive to light.  ?Cardiovascular:  ?   Rate and Rhythm: Normal rate and regular rhythm.  ?Pulmonary:  ?   Effort: Pulmonary effort  is normal.  ?Chest:  ?   Chest wall: No mass.  ?Abdominal:  ?   General: There is no distension.  ?Musculoskeletal:     ?   General: Normal range of motion.  ?   Cervical back: Normal range of motion.  ?Skin: ?   General: Skin is warm.  ?   Capillary Refill: Capillary refill takes less than 2 seconds.  ?Neurological:  ?   Mental Status: She is alert and oriented to person, place, and time.  ?Psychiatric:     ?   Mood and Affect: Mood normal.  ? ? ?ED Results / Procedures / Treatments   ?Labs ?(all labs ordered are listed, but only abnormal results are displayed) ?Labs Reviewed  ?CBC WITH DIFFERENTIAL/PLATELET - Abnormal; Notable for the following components:  ?    Result Value  ? RBC 3.15 (*)   ? Hemoglobin 9.4 (*)   ? HCT 32.4 (*)   ? MCV 102.9  (*)   ? MCHC 29.0 (*)   ? RDW 18.5 (*)   ? Platelets 134 (*)   ? Abs Immature Granulocytes 0.15 (*)   ? All other components within normal limits  ?COMPREHENSIVE METABOLIC PANEL - Abnormal; Notable for the following components:  ? Chloride 113 (*)   ? CO2 12 (*)   ? Glucose, Bld 101 (*)   ? BUN 90 (*)   ? Creatinine, Ser 3.47 (*)   ? Calcium 7.2 (*)   ? Total Protein 6.0 (*)   ? GFR, Estimated 14 (*)   ? Anion gap 16 (*)   ? All other components within normal limits  ?URINALYSIS, ROUTINE W REFLEX MICROSCOPIC - Abnormal; Notable for the following components:  ? Color, Urine STRAW (*)   ? Hgb urine dipstick LARGE (*)   ? Protein, ur 30 (*)   ? Leukocytes,Ua SMALL (*)   ? RBC / HPF >50 (*)   ? Bacteria, UA RARE (*)   ? All other components within normal limits  ?URINE CULTURE  ? ? ?EKG ?None ? ?Radiology ?DG Chest Port 1 View ? ?Result Date: 05/07/2021 ?CLINICAL DATA:  Cough, bilateral lower extremity swelling. EXAM: PORTABLE CHEST 1 VIEW COMPARISON:  February 15, 2021. FINDINGS: Stable cardiomegaly. Both lungs are clear. The visualized skeletal structures are unremarkable. IMPRESSION: No active disease. Aortic Atherosclerosis (ICD10-I70.0). Electronically Signed   By: Marijo Conception M.D.   On: 05/07/2021 14:31   ? ?Procedures ?Procedures  ? ? ?Medications Ordered in ED ?Medications  ?cephALEXin (KEFLEX) capsule 500 mg (500 mg Oral Given 05/07/21 1749)  ? ? ?ED Course/ Medical Decision Making/ A&P ?  ?                        ?Medical Decision Making ?Patient complains of swelling in both feet patient reports she has some shortness of breath when she walks. ? ?Amount and/or Complexity of Data Reviewed ?Independent Historian:  ?   Details: Daughter with patient and gives history of swelling in both feet she reports patient sits in a chair with her feet down all day ?External Data Reviewed: notes. ?   Details: Notes from recent hospitalization reviewed patient was seen by nephrology Dr. Rich Number she has been followed by  nephrology for kidney failure.  Patient has been told that she is not a candidate for dialysis ?Labs: ordered. Decision-making details documented in ED Course. ?   Details: Laboratory evaluations are ordered reviewed and interpreted patient  has a hemoglobin of 9.4 this is at her baseline platelets are 134 and this is at baseline patient has a BUN of 90 and creatinine of 3.47 this is improved from patient's recent labs when she was hospitalized.  Urinalysis shows 21-50 white blood cells greater than 50 red blood cells many bacteria. ?Radiology: ordered and independent interpretation performed. Decision-making details documented in ED Course. ?   Details: Chest x-ray is obtained reviewed and interpreted chest x-ray shows no evidence of congestive heart failure bilateral lungs are clear ? ?Risk ?Prescription drug management. ?Risk Details: Patient counseled on urinary tract infections patient is given a prescription for Keflex.  Patient has had decrease in swelling of her feet while sitting on the stretcher with her feet elevated patient advised that she should elevate her feet at home. ? ? ? ? ? ? ? ? ? ? ?Final Clinical Impression(s) / ED Diagnoses ?Final diagnoses:  ?Urinary tract infection without hematuria, site unspecified  ? ? ?Rx / DC Orders ?ED Discharge Orders   ? ?      Ordered  ?  cephALEXin (KEFLEX) 500 MG capsule  3 times daily       ? 05/07/21 1730  ? ?  ?  ? ?  ? ?An After Visit Summary was printed and given to the patient.  ?  ?Fransico Meadow, PA-C ?05/07/21 2303 ? ?  ?Luna Fuse, MD ?05/18/21 1643 ? ?

## 2021-05-09 LAB — URINE CULTURE: Culture: >=100000 — AB

## 2021-06-22 ENCOUNTER — Emergency Department: Payer: Medicare Other

## 2021-06-22 ENCOUNTER — Emergency Department: Admit: 2021-06-22 | Payer: Medicare Other | Admitting: Cardiology

## 2021-06-22 ENCOUNTER — Other Ambulatory Visit: Payer: Self-pay

## 2021-06-22 ENCOUNTER — Inpatient Hospital Stay
Admission: EM | Admit: 2021-06-22 | Discharge: 2021-06-24 | DRG: 871 | Disposition: A | Discharge status: Other Acute Inpatient Hospital | Payer: Medicare Other | Attending: Internal Medicine | Admitting: Internal Medicine

## 2021-06-22 DIAGNOSIS — E1122 Type 2 diabetes mellitus with diabetic chronic kidney disease: Secondary | ICD-10-CM | POA: Diagnosis present

## 2021-06-22 DIAGNOSIS — R6520 Severe sepsis without septic shock: Secondary | ICD-10-CM | POA: Diagnosis present

## 2021-06-22 DIAGNOSIS — E785 Hyperlipidemia, unspecified: Secondary | ICD-10-CM | POA: Diagnosis present

## 2021-06-22 DIAGNOSIS — D631 Anemia in chronic kidney disease: Secondary | ICD-10-CM | POA: Diagnosis present

## 2021-06-22 DIAGNOSIS — Z833 Family history of diabetes mellitus: Secondary | ICD-10-CM | POA: Diagnosis not present

## 2021-06-22 DIAGNOSIS — J189 Pneumonia, unspecified organism: Secondary | ICD-10-CM | POA: Diagnosis present

## 2021-06-22 DIAGNOSIS — I10 Essential (primary) hypertension: Secondary | ICD-10-CM | POA: Diagnosis not present

## 2021-06-22 DIAGNOSIS — E875 Hyperkalemia: Secondary | ICD-10-CM | POA: Diagnosis present

## 2021-06-22 DIAGNOSIS — E869 Volume depletion, unspecified: Secondary | ICD-10-CM | POA: Diagnosis present

## 2021-06-22 DIAGNOSIS — I5032 Chronic diastolic (congestive) heart failure: Secondary | ICD-10-CM

## 2021-06-22 DIAGNOSIS — J9601 Acute respiratory failure with hypoxia: Secondary | ICD-10-CM

## 2021-06-22 DIAGNOSIS — Z7901 Long term (current) use of anticoagulants: Secondary | ICD-10-CM

## 2021-06-22 DIAGNOSIS — Z79899 Other long term (current) drug therapy: Secondary | ICD-10-CM

## 2021-06-22 DIAGNOSIS — D849 Immunodeficiency, unspecified: Secondary | ICD-10-CM | POA: Diagnosis present

## 2021-06-22 DIAGNOSIS — E872 Acidosis, unspecified: Secondary | ICD-10-CM | POA: Diagnosis present

## 2021-06-22 DIAGNOSIS — Z8249 Family history of ischemic heart disease and other diseases of the circulatory system: Secondary | ICD-10-CM | POA: Diagnosis not present

## 2021-06-22 DIAGNOSIS — I130 Hypertensive heart and chronic kidney disease with heart failure and stage 1 through stage 4 chronic kidney disease, or unspecified chronic kidney disease: Secondary | ICD-10-CM | POA: Diagnosis present

## 2021-06-22 DIAGNOSIS — R6521 Severe sepsis with septic shock: Secondary | ICD-10-CM

## 2021-06-22 DIAGNOSIS — Z20822 Contact with and (suspected) exposure to covid-19: Secondary | ICD-10-CM | POA: Diagnosis present

## 2021-06-22 DIAGNOSIS — Z794 Long term (current) use of insulin: Secondary | ICD-10-CM | POA: Diagnosis not present

## 2021-06-22 DIAGNOSIS — N059 Unspecified nephritic syndrome with unspecified morphologic changes: Secondary | ICD-10-CM | POA: Diagnosis present

## 2021-06-22 DIAGNOSIS — I2510 Atherosclerotic heart disease of native coronary artery without angina pectoris: Secondary | ICD-10-CM | POA: Diagnosis present

## 2021-06-22 DIAGNOSIS — I4892 Unspecified atrial flutter: Secondary | ICD-10-CM | POA: Diagnosis present

## 2021-06-22 DIAGNOSIS — N179 Acute kidney failure, unspecified: Secondary | ICD-10-CM | POA: Diagnosis present

## 2021-06-22 DIAGNOSIS — B9620 Unspecified Escherichia coli [E. coli] as the cause of diseases classified elsewhere: Secondary | ICD-10-CM | POA: Diagnosis present

## 2021-06-22 DIAGNOSIS — I151 Hypertension secondary to other renal disorders: Secondary | ICD-10-CM | POA: Diagnosis not present

## 2021-06-22 DIAGNOSIS — D649 Anemia, unspecified: Secondary | ICD-10-CM | POA: Diagnosis present

## 2021-06-22 DIAGNOSIS — I4891 Unspecified atrial fibrillation: Secondary | ICD-10-CM | POA: Diagnosis present

## 2021-06-22 DIAGNOSIS — N1831 Chronic kidney disease, stage 3a: Secondary | ICD-10-CM | POA: Diagnosis not present

## 2021-06-22 DIAGNOSIS — N184 Chronic kidney disease, stage 4 (severe): Secondary | ICD-10-CM | POA: Diagnosis present

## 2021-06-22 DIAGNOSIS — I447 Left bundle-branch block, unspecified: Secondary | ICD-10-CM | POA: Diagnosis present

## 2021-06-22 DIAGNOSIS — R7881 Bacteremia: Secondary | ICD-10-CM | POA: Diagnosis not present

## 2021-06-22 DIAGNOSIS — N2889 Other specified disorders of kidney and ureter: Secondary | ICD-10-CM | POA: Diagnosis not present

## 2021-06-22 DIAGNOSIS — A419 Sepsis, unspecified organism: Principal | ICD-10-CM | POA: Diagnosis present

## 2021-06-22 DIAGNOSIS — E119 Type 2 diabetes mellitus without complications: Secondary | ICD-10-CM | POA: Diagnosis not present

## 2021-06-22 LAB — COMPREHENSIVE METABOLIC PANEL
ALT: 14 U/L (ref 0–44)
AST: 24 U/L (ref 15–41)
Albumin: 3 g/dL — ABNORMAL LOW (ref 3.5–5.0)
Alkaline Phosphatase: 48 U/L (ref 38–126)
Anion gap: 14 (ref 5–15)
BUN: 104 mg/dL — ABNORMAL HIGH (ref 8–23)
CO2: 10 mmol/L — ABNORMAL LOW (ref 22–32)
Calcium: 8.5 mg/dL — ABNORMAL LOW (ref 8.9–10.3)
Chloride: 112 mmol/L — ABNORMAL HIGH (ref 98–111)
Creatinine, Ser: 4.47 mg/dL — ABNORMAL HIGH (ref 0.44–1.00)
GFR, Estimated: 10 mL/min — ABNORMAL LOW (ref >60)
Glucose, Bld: 204 mg/dL — ABNORMAL HIGH (ref 70–99)
Potassium: 5.7 mmol/L — ABNORMAL HIGH (ref 3.5–5.1)
Sodium: 136 mmol/L (ref 135–145)
Total Bilirubin: 0.8 mg/dL (ref 0.3–1.2)
Total Protein: 6.5 g/dL (ref 6.5–8.1)

## 2021-06-22 LAB — CBC WITH DIFFERENTIAL/PLATELET
Abs Immature Granulocytes: 3.7 10*3/uL — ABNORMAL HIGH (ref 0.00–0.07)
Basophils Absolute: 0.1 10*3/uL (ref 0.0–0.1)
Basophils Relative: 1 %
Eosinophils Absolute: 0 10*3/uL (ref 0.0–0.5)
Eosinophils Relative: 0 %
HCT: 35.5 % — ABNORMAL LOW (ref 36.0–46.0)
Hemoglobin: 11 g/dL — ABNORMAL LOW (ref 12.0–15.0)
Immature Granulocytes: 68 %
Lymphocytes Relative: 4 %
Lymphs Abs: 0.2 10*3/uL — ABNORMAL LOW (ref 0.7–4.0)
MCH: 30.1 pg (ref 26.0–34.0)
MCHC: 31 g/dL (ref 30.0–36.0)
MCV: 97 fL (ref 80.0–100.0)
Monocytes Absolute: 0.2 10*3/uL (ref 0.1–1.0)
Monocytes Relative: 3 %
Neutro Abs: 1.3 10*3/uL — ABNORMAL LOW (ref 1.7–7.7)
Neutrophils Relative %: 24 %
Platelets: 129 10*3/uL — ABNORMAL LOW (ref 150–400)
RBC: 3.66 MIL/uL — ABNORMAL LOW (ref 3.87–5.11)
RDW: 14.6 % (ref 11.5–15.5)
Smear Review: NORMAL
WBC: 5.4 10*3/uL (ref 4.0–10.5)
nRBC: 0 % (ref 0.0–0.2)

## 2021-06-22 LAB — PATHOLOGIST SMEAR REVIEW

## 2021-06-22 LAB — BLOOD GAS, VENOUS
Acid-base deficit: 15.6 mmol/L — ABNORMAL HIGH (ref 0.0–2.0)
Bicarbonate: 10.9 mmol/L — ABNORMAL LOW (ref 20.0–28.0)
O2 Saturation: 68.6 %
Patient temperature: 37
pCO2, Ven: 28 mmHg — ABNORMAL LOW (ref 44–60)
pH, Ven: 7.2 — ABNORMAL LOW (ref 7.25–7.43)
pO2, Ven: 43 mmHg (ref 32–45)

## 2021-06-22 LAB — URINALYSIS, COMPLETE (UACMP) WITH MICROSCOPIC
Bilirubin Urine: NEGATIVE
Glucose, UA: NEGATIVE mg/dL
Hgb urine dipstick: NEGATIVE
Ketones, ur: NEGATIVE mg/dL
Leukocytes,Ua: NEGATIVE
Nitrite: NEGATIVE
Protein, ur: 30 mg/dL — AB
Specific Gravity, Urine: 1.013 (ref 1.005–1.030)
pH: 5 (ref 5.0–8.0)

## 2021-06-22 LAB — CBG MONITORING, ED
Glucose-Capillary: 162 mg/dL — ABNORMAL HIGH (ref 70–99)
Glucose-Capillary: 173 mg/dL — ABNORMAL HIGH (ref 70–99)

## 2021-06-22 LAB — RESP PANEL BY RT-PCR (FLU A&B, COVID) ARPGX2
Influenza A by PCR: NEGATIVE
Influenza B by PCR: NEGATIVE
SARS Coronavirus 2 by RT PCR: NEGATIVE

## 2021-06-22 LAB — LACTIC ACID, PLASMA
Lactic Acid, Venous: 4.4 mmol/L (ref 0.5–1.9)
Lactic Acid, Venous: 5.7 mmol/L (ref 0.5–1.9)

## 2021-06-22 LAB — APTT: aPTT: 46 seconds — ABNORMAL HIGH (ref 24–36)

## 2021-06-22 LAB — TROPONIN I (HIGH SENSITIVITY)
Troponin I (High Sensitivity): 170 ng/L (ref <18)
Troponin I (High Sensitivity): 145 ng/L (ref <18)

## 2021-06-22 LAB — PROTIME-INR
INR: 3.7 — ABNORMAL HIGH (ref 0.8–1.2)
Prothrombin Time: 36.6 seconds — ABNORMAL HIGH (ref 11.4–15.2)

## 2021-06-22 LAB — LIPASE, BLOOD: Lipase: 27 U/L (ref 11–51)

## 2021-06-22 SURGERY — CORONARY/GRAFT ACUTE MI REVASCULARIZATION
Anesthesia: Moderate Sedation

## 2021-06-22 MED ORDER — ALBUTEROL SULFATE (2.5 MG/3ML) 0.083% IN NEBU
2.5000 mg | INHALATION_SOLUTION | Freq: Once | RESPIRATORY_TRACT | Status: AC
  Administered 2021-06-22: 2.5 mg via RESPIRATORY_TRACT
  Filled 2021-06-22: qty 3

## 2021-06-22 MED ORDER — SODIUM CHLORIDE 0.9 % IV SOLN
500.0000 mg | INTRAVENOUS | Status: DC
  Administered 2021-06-22 – 2021-06-24 (×3): 500 mg via INTRAVENOUS
  Filled 2021-06-22 (×4): qty 5

## 2021-06-22 MED ORDER — ACETAMINOPHEN 650 MG RE SUPP: 650.0000 mg | Freq: Four times a day (QID) | RECTAL | Status: DC | PRN

## 2021-06-22 MED ORDER — SODIUM CHLORIDE 0.9 % IV SOLN
2.0000 g | Freq: Once | INTRAVENOUS | Status: AC
  Administered 2021-06-22: 2 g via INTRAVENOUS
  Filled 2021-06-22: qty 20

## 2021-06-22 MED ORDER — IPRATROPIUM-ALBUTEROL 0.5-2.5 (3) MG/3ML IN SOLN
RESPIRATORY_TRACT | Status: AC
  Administered 2021-06-22: 3 mL via RESPIRATORY_TRACT
  Filled 2021-06-22: qty 3

## 2021-06-22 MED ORDER — SODIUM ZIRCONIUM CYCLOSILICATE 10 G PO PACK
10.0000 g | PACK | Freq: Every day | ORAL | Status: DC
  Administered 2021-06-22: 10 g via ORAL
  Filled 2021-06-22 (×3): qty 1

## 2021-06-22 MED ORDER — PANTOPRAZOLE SODIUM 40 MG PO TBEC
40.0000 mg | DELAYED_RELEASE_TABLET | Freq: Every day | ORAL | Status: DC
  Administered 2021-06-23 – 2021-06-24 (×2): 40 mg via ORAL
  Filled 2021-06-22 (×3): qty 1

## 2021-06-22 MED ORDER — LACTATED RINGERS IV BOLUS (SEPSIS)
250.0000 mL | Freq: Once | INTRAVENOUS | Status: AC
  Administered 2021-06-22: 250 mL via INTRAVENOUS

## 2021-06-22 MED ORDER — ONDANSETRON HCL 4 MG PO TABS: 4.0000 mg | ORAL_TABLET | Freq: Four times a day (QID) | ORAL | Status: DC | PRN

## 2021-06-22 MED ORDER — INSULIN ASPART 100 UNIT/ML IJ SOLN
5.0000 [IU] | Freq: Once | INTRAMUSCULAR | Status: AC
  Administered 2021-06-22: 5 [IU] via INTRAVENOUS
  Filled 2021-06-22: qty 1

## 2021-06-22 MED ORDER — PREDNISONE 50 MG PO TABS
60.0000 mg | ORAL_TABLET | Freq: Every day | ORAL | Status: DC
  Administered 2021-06-23 – 2021-06-24 (×2): 60 mg via ORAL
  Filled 2021-06-22: qty 3
  Filled 2021-06-22: qty 1

## 2021-06-22 MED ORDER — HEPARIN SODIUM (PORCINE) 5000 UNIT/ML IJ SOLN: 5000.0000 [IU] | Freq: Three times a day (TID) | INTRAMUSCULAR | Status: DC

## 2021-06-22 MED ORDER — LACTATED RINGERS IV BOLUS (SEPSIS)
1000.0000 mL | Freq: Once | INTRAVENOUS | Status: AC
  Administered 2021-06-22: 1000 mL via INTRAVENOUS

## 2021-06-22 MED ORDER — SODIUM BICARBONATE 8.4 % IV SOLN
50.0000 meq | Freq: Once | INTRAVENOUS | Status: AC
  Administered 2021-06-22: 50 meq via INTRAVENOUS
  Filled 2021-06-22: qty 50

## 2021-06-22 MED ORDER — INSULIN ASPART 100 UNIT/ML IJ SOLN
0.0000 [IU] | Freq: Three times a day (TID) | INTRAMUSCULAR | Status: DC
  Administered 2021-06-22: 2 [IU] via SUBCUTANEOUS
  Administered 2021-06-23 (×3): 1 [IU] via SUBCUTANEOUS
  Administered 2021-06-24 (×2): 2 [IU] via SUBCUTANEOUS
  Filled 2021-06-22 (×6): qty 1

## 2021-06-22 MED ORDER — VANCOMYCIN HCL 1500 MG/300ML IV SOLN
1500.0000 mg | Freq: Once | INTRAVENOUS | Status: AC
  Administered 2021-06-22: 1500 mg via INTRAVENOUS
  Filled 2021-06-22: qty 300

## 2021-06-22 MED ORDER — VANCOMYCIN VARIABLE DOSE PER UNSTABLE RENAL FUNCTION (PHARMACIST DOSING): Status: DC

## 2021-06-22 MED ORDER — ONDANSETRON HCL 4 MG/2ML IJ SOLN: 4.0000 mg | Freq: Four times a day (QID) | INTRAMUSCULAR | Status: DC | PRN

## 2021-06-22 MED ORDER — LACTATED RINGERS IV SOLN: INTRAVENOUS | Status: AC

## 2021-06-22 MED ORDER — SODIUM CHLORIDE 0.9 % IV SOLN
2.0000 g | INTRAVENOUS | Status: DC
  Administered 2021-06-22: 2 g via INTRAVENOUS
  Filled 2021-06-22: qty 12.5

## 2021-06-22 MED ORDER — BENZONATATE 100 MG PO CAPS
100.0000 mg | ORAL_CAPSULE | Freq: Three times a day (TID) | ORAL | Status: DC | PRN
  Administered 2021-06-22 – 2021-06-24 (×5): 100 mg via ORAL
  Filled 2021-06-22 (×5): qty 1

## 2021-06-22 MED ORDER — APIXABAN 5 MG PO TABS
5.0000 mg | ORAL_TABLET | Freq: Two times a day (BID) | ORAL | Status: DC
  Administered 2021-06-23 – 2021-06-24 (×4): 5 mg via ORAL
  Filled 2021-06-22 (×4): qty 1

## 2021-06-22 MED ORDER — ATORVASTATIN CALCIUM 20 MG PO TABS
40.0000 mg | ORAL_TABLET | Freq: Every day | ORAL | Status: DC
  Administered 2021-06-22 – 2021-06-24 (×3): 40 mg via ORAL
  Filled 2021-06-22 (×3): qty 2

## 2021-06-22 MED ORDER — ACETAMINOPHEN 325 MG PO TABS
650.0000 mg | ORAL_TABLET | Freq: Four times a day (QID) | ORAL | Status: DC | PRN
  Administered 2021-06-23: 650 mg via ORAL
  Filled 2021-06-22 (×2): qty 2

## 2021-06-22 MED ORDER — IPRATROPIUM-ALBUTEROL 0.5-2.5 (3) MG/3ML IN SOLN
3.0000 mL | Freq: Once | RESPIRATORY_TRACT | Status: AC
Start: 2021-06-22 — End: 2021-06-22

## 2021-06-22 NOTE — Sepsis Progress Note (Signed)
eLink is following this Code Sepsis. °

## 2021-06-22 NOTE — Progress Notes (Signed)
Communicated with MD/RN to get additional lactic acid evaluation. ?

## 2021-06-22 NOTE — Significant Event (Signed)
Cardiology STEMI evaluation ? ?EMS alerted [place] for possible STEMI in route.  Briefly Mackenzie [name] is a 71 year old female with recent severe acute kidney injury with creatinine up to 5.8 in January, HFpEF, paroxysmal atrial fibrillation who has been experiencing nausea and vomiting as well as shortness of breath for the last few days.  Today she felt quite short of breath trying to get into the car to go see her other doctor prompting them to call EMS.  Specifically she denies any chest pain currently or in the past. ? ?On arrival she is tachycardic in atrial flutter with variable conduction with a left bundle branch block (chronic).  I do not believe this meets STEMI criteria and given her recent issues with acute kidney injury and lack of chest pain we will defer emergent heart catheterization. ? ?She appears to need an evaluation for possible sepsis or other medical conditions ongoing, and if cardiology consultation is required please contact your primary cardiologist [facility]) for further evaluation. ? ?Andrez Grime, MD ? ?

## 2021-06-22 NOTE — ED Provider Notes (Signed)
? ?[facility] ?Provider Note ? ? ? Event Date/Time  ? First MD Initiated Contact with Patient 06/22/21 1207   ?  (approximate) ? ? ?History  ? ?Shortness of Breath (Patient presents with 2 days of shortness of breath, back pain, and malaise; Patient reports decreased appetite and is lethargic during triage) ? ? ?HPI ? ?Mackenzie [name] is a 71 y.o. female to the ER for back pain generalized malaise shortness of breath for the past several days.  Reportedly has been having symptoms of urinary tract infection and was given prescription for antibiotic took 1 dose and then had a "bad reaction "and did not take anymore.  EMS was called the day because she is been feeling worse.  Reportedly poor p.o. intake.  EKG found the patient ill-appearing EKG was drawn that showed left bundle branch field STEMI was called.  Patient was evaluated immediately by cardiology at bedside but given her symptoms and lack of findings of STEMI on EKG code STEMI was canceled. ?  ? ? ?Physical Exam  ? ?Triage Vital Signs: ?ED Triage Vitals [06/22/21 1215]  ?Enc Vitals Group  ?   BP   ?   Pulse Rate (!) 119  ?   Resp (!) 30  ?   Temp 98.3 ?F (36.8 ?C)  ?   Temp Source Oral  ?   SpO2 95 %  ?   Weight   ?   Height   ?   Head Circumference   ?   Peak Flow   ?   Pain Score   ?   Pain Loc   ?   Pain Edu?   ?   Excl. in [place]?   ? ? ?Most recent vital signs: ?Vitals:  ? 06/22/21 1217 06/22/21 1255  ?BP: 100/62   ?Pulse:    ?Resp: (!) 24   ?Temp:  99.1 ?F (37.3 ?C)  ?SpO2: 95%   ? ? ? ?Constitutional: Alert ill appearing ?Eyes: Conjunctivae are normal.  ?Head: Atraumatic. ?Nose: No congestion/rhinnorhea. ?Mouth/Throat: Mucous membranes are moist.   ?Neck: Painless ROM.  ?Cardiovascular:   Good peripheral circulation. ?Respiratory: Normal respiratory effort.  No retractions.  ?Gastrointestinal: Soft and nontender.  ?Musculoskeletal:  no deformity ?Neurologic:  MAE spontaneously. No gross focal neurologic deficits are appreciated.   ?Skin:  Skin is warm, dry and intact. No rash noted. ?Psychiatric: Mood and affect are normal. Speech and behavior are normal. ? ? ? ?ED Results / Procedures / Treatments  ? ?Labs ?(all labs ordered are listed, but only abnormal results are displayed) ?Labs Reviewed  ?LACTIC ACID, PLASMA - Abnormal; Notable for the following components:  ?    Result Value  ? Lactic Acid, Venous 4.4 (*)   ? All other components within normal limits  ?COMPREHENSIVE METABOLIC PANEL - Abnormal; Notable for the following components:  ? Potassium 5.7 (*)   ? Chloride 112 (*)   ? CO2 10 (*)   ? Glucose, Bld 204 (*)   ? BUN 104 (*)   ? Creatinine, Ser 4.47 (*)   ? Calcium 8.5 (*)   ? Albumin 3.0 (*)   ? GFR, Estimated 10 (*)   ? All other components within normal limits  ?CBC WITH DIFFERENTIAL/PLATELET - Abnormal; Notable for the following components:  ? RBC 3.66 (*)   ? Hemoglobin 11.0 (*)   ? HCT 35.5 (*)   ? Platelets 129 (*)   ? Neutro Abs 1.3 (*)   ? Lymphs Abs  0.2 (*)   ? Abs Immature Granulocytes 3.70 (*)   ? All other components within normal limits  ?PROTIME-INR - Abnormal; Notable for the following components:  ? Prothrombin Time 36.6 (*)   ? INR 3.7 (*)   ? All other components within normal limits  ?APTT - Abnormal; Notable for the following components:  ? aPTT 46 (*)   ? All other components within normal limits  ?URINALYSIS, COMPLETE (UACMP) WITH MICROSCOPIC - Abnormal; Notable for the following components:  ? Color, Urine YELLOW (*)   ? APPearance HAZY (*)   ? Protein, ur 30 (*)   ? Bacteria, UA FEW (*)   ? All other components within normal limits  ?TROPONIN I (HIGH SENSITIVITY) - Abnormal; Notable for the following components:  ? Troponin I (High Sensitivity) 170 (*)   ? All other components within normal limits  ?CULTURE, BLOOD (ROUTINE X 2)  ?CULTURE, BLOOD (ROUTINE X 2)  ?URINE CULTURE  ?RESP PANEL BY RT-PCR (FLU A&B, COVID) [id number]  ?LIPASE, BLOOD  ?LACTIC ACID, PLASMA  ?PATHOLOGIST SMEAR REVIEW  ?BLOOD GAS, VENOUS   ?TROPONIN I (HIGH SENSITIVITY)  ? ? ? ?EKG ? ?ED ECG REPORT ?I, Merlyn Lot, the attending physician, personally viewed and interpreted this ECG. ? ? Date: 06/22/2021 ? EKG Time: 12:13 ? Rate: 120 ? Rhythm: a fib ? Axis: normal ? Intervals:normal ? ST&T Change: lbbb, no sgarbossa criteria ? ? ? ?RADIOLOGY ?Please see ED Course for my review and interpretation. ? ?I personally reviewed all radiographic images ordered to evaluate for the above acute complaints and reviewed radiology reports and findings.  These findings were personally discussed with the patient.  Please see medical record for radiology report. ? ? ? ?PROCEDURES: ? ?Critical Care performed: Yes, see critical care procedure note(s) ? ?.Critical Care ?Performed by: Merlyn Lot, MD ?Authorized by: Merlyn Lot, MD  ? ?Critical care provider statement:  ?  Critical care time (minutes):  40 ?  Critical care was necessary to treat or prevent imminent or life-threatening deterioration of the following conditions:  Sepsis and metabolic crisis ?  Critical care was time spent personally by me on the following activities:  Ordering and performing treatments and interventions, ordering and review of laboratory studies, ordering and review of radiographic studies, pulse oximetry, re-evaluation of patient's condition, review of old charts, obtaining history from patient or surrogate, examination of patient, evaluation of patient's response to treatment, discussions with primary provider, discussions with consultants and development of treatment plan with patient or surrogate ? ? ?MEDICATIONS ORDERED IN ED: ?Medications  ?cefTRIAXone (ROCEPHIN) 2 g in sodium chloride 0.9 % 100 mL IVPB (2 g Intravenous New Bag/Given 06/22/21 1405)  ?lactated ringers infusion (has no administration in time range)  ?lactated ringers bolus 1,000 mL (1,000 mLs Intravenous New Bag/Given 06/22/21 1346)  ?  And  ?lactated ringers bolus 1,000 mL (1,000 mLs Intravenous New  Bag/Given 06/22/21 1346)  ?  And  ?lactated ringers bolus 250 mL (has no administration in time range)  ?azithromycin (ZITHROMAX) 500 mg in sodium chloride 0.9 % 250 mL IVPB (has no administration in time range)  ?sodium bicarbonate injection 50 mEq (has no administration in time range)  ?insulin aspart (novoLOG) injection 5 Units (has no administration in time range)  ?albuterol (PROVENTIL) (2.5 MG/3ML) 0.083% nebulizer solution 2.5 mg (has no administration in time range)  ? ? ? ?IMPRESSION / MDM / ASSESSMENT AND PLAN / ED COURSE  ?I reviewed the triage vital signs and the nursing  notes. ?             ?               ? ?Differential diagnosis includes, but is not limited to, Dehydration, sepsis, pna, uti, hypoglycemia, cva, drug effect, withdrawal, encephalitis ? ?Patient presented to the ER with symptoms as described above she is ill-appearing mildly tachycardic tachypneic complaining of back pain as described above eval by cardiology not felt to meet STEMI criteria more concern for infectious process blood work will be sent for the but differential ? ?Clinical Course as of 06/22/21 1415  ?Fri Jun 22, 2021  ?1319 her lactate is critically elevated have ordered abx [PR]  ?1319 Chest x-ray on my review and interpretation appears consistent with pneumonia right lower lobe.  Will broaden antibiotics.  Will make sepsis alert. [PR]  ?1345 Troponin is elevated likely secondary demand ischemia in the setting of probable sepsis and pneumonia.  IV fluids as well as broad-spectrum antibiotics have been ordered but patient with poor IV access. [PR]  ?1352 Patient noted to with significant metabolic acidosis with normal anion gap potassium is elevated to 5.7 she is hyperglycemic BUN elevated 104 she is receiving IV fluids EKG did not showing peaked T waves appears consistent with sinus dysrhythmia do not feel that this reflects A-fib.  Will treat hyperkalemia with insulin and bicarb. [PR]  ?1412 Case discussed in consultation  with hospitalist agrees to admit patient to their service. [PR]  ?  ?Clinical Course User Index ?[PR] Merlyn Lot, MD  ? ? ? ?FINAL CLINICAL IMPRESSION(S) / ED DIAGNOSES  ? ?Final diagnoses:  ?Sepsis with acute

## 2021-06-22 NOTE — Consult Note (Signed)
CODE SEPSIS - PHARMACY COMMUNICATION ? ?**Broad Spectrum Antibiotics should be administered within 1 hour of Sepsis diagnosis** ? ?Time Code Sepsis Called/Page Received: 3539 ? ?Antibiotics Ordered: 1330 ? ?Time of 1st antibiotic administration: 1405 ? ?Additional action taken by pharmacy: N/A ? ?If necessary, Name of Provider/Nurse Contacted: N/A ? ?Lorna Dibble ,PharmD ?Clinical Pharmacist  ?06/22/2021  1:28 PM ? ?

## 2021-06-22 NOTE — H&P (Addendum)
? ? ?History and Physical:  ? ? ?[name]  ? ?VVZ:[id number] DOB: [birth year] DOA: 06/22/2021 ? ?Referring MD/provider: Dr. Quentin Cornwall ?PCP: [name], MD  ? ?Patient coming from: Home ? ?Chief Complaint: Fatigue and weakness for a week, shortness of breath since last night.  History is primarily per patient's daughter given significant tachypnea. ? ?History of Present Illness:  ? ?Mackenzie [name] is an 71 y.o. female with HTN, CAD, DM 2, HFpEF, CKD 3B and atrial fibrillation was in [facility] until 1 to 2 weeks ago when she started having progressive fatigue and malaise.  She was seen in PCP office and was treated for UTI.  However the medication caused nausea and vomiting and diarrhea so she only took 1 dose and then stop taking it. ? ?Her daughter noticed that over the past 2 days patient has been sleeping and in the morning which is unusual for her.  Usually she is up and about helping with her grandkids but has stayed in bed the last couple of days.  Last night and into early this morning patient had progressive increase in shortness of breath and progressive increase in cough and was brought to the ED.  Patient denies fevers but is not sure.  No further nausea vomiting or diarrhea since she stopped taking the OTC antibiotic for UTI. ? ?ED Course:  The patient was noted to be markedly hypotensive and tachypneic.  EMS had called patient in as a STEMI however patient was seen by cardiology who noted that this was not a STEMI just LBBB.  Further work-up revealed multifocal pneumonia and a bland urine sediment.  Patient was treated with sepsis protocol with aggressive IV fluids and initiation of ceftriaxone and azithromycin. ? ?ROS:  ? ?ROS  ?As per HPI ? ? ?Past Medical History:  ? ?Past Medical History:  ?Diagnosis Date  ? Arthritis   ? Atrial fibrillation ([place])   ? CHF (congestive heart failure) ([place])   ? HLD (hyperlipidemia)   ? Hypertension   ? ? ?Past Surgical History:  ? ?Past Surgical History:  ?Procedure  Laterality Date  ? COLONOSCOPY WITH PROPOFOL N/A 01/19/2021  ? hemorrhoids on perianal exam, examined portion ofileum normal, entire examined colon, normal. non bleeding external hemorrhoids, no specimens  ? ESOPHAGOGASTRODUODENOSCOPY (EGD) WITH PROPOFOL N/A 01/17/2021  ? normal hypopharynx and esophagus, z line regular 37cm from incisors, 2cm HH, congestive gastropathy, hematobulbar and post bulbar mucosa with hemosiderosis but no evidence of PUD. no specimens  ? GIVENS CAPSULE STUDY N/A 02/02/2021  ? Procedure: GIVENS CAPSULE STUDY;  Surgeon: Harvel Quale, MD;  Location: [facility];  Service: Gastroenterology;  Laterality: N/A;  7:30  ? LEFT HEART CATH AND CORONARY ANGIOGRAPHY Right 05/06/2016  ? Procedure: Left Heart Cath and Coronary Angiography;  Surgeon: [name], MD;  Location: [place] [facility];  Service: Cardiovascular;  Laterality: Right;  ? ? ?Social History:  ? ?Social History  ? ?Socioeconomic History  ? Marital status: Widowed  ?  Spouse name: Not on file  ? Number of children: Not on file  ? Years of education: Not on file  ? Highest education level: Not on file  ?Occupational History  ? Not on file  ?Tobacco Use  ? Smoking status: Never  ? Smokeless tobacco: Never  ?Vaping Use  ? Vaping Use: Never used  ?Substance and Sexual Activity  ? Alcohol use: No  ? Drug use: No  ? Sexual activity: Not on file  ?  Other Topics Concern  ? Not on file  ?Social History Narrative  ? Not on file  ? ?Social Determinants of Health  ? ?Financial Resource Strain: Not on file  ?Food Insecurity: Not on file  ?Transportation Needs: Not on file  ?Physical Activity: Not on file  ?Stress: Not on file  ?Social Connections: Not on file  ?Intimate Partner Violence: Not on file  ? ? ?Allergies  ? ?Patient has no known allergies. ? ?Family history:  ? ?Family History  ?Problem Relation Age of Onset  ? Diabetes Mother   ? Heart disease Mother   ? Heart attack Father   ? Diabetes Brother   ? Breast cancer  Maternal Grandmother   ? Breast cancer Maternal Aunt   ? Colon cancer Neg Hx   ? Colon polyps Neg Hx   ? ? ?Current Medications:  ? ?Prior to Admission medications   ?Medication Sig Start Date End Date Taking? Authorizing Provider  ?albuterol (PROVENTIL HFA;VENTOLIN HFA) 108 (90 Base) MCG/ACT inhaler Inhale 2 puffs into the lungs daily as needed for wheezing or shortness of breath. 06/11/16   Alisa Graff, FNP  ?amLODipine (NORVASC) 5 MG tablet Take 1 tablet (5 mg total) by mouth daily. 03/17/21   Phillips Grout, MD  ?apixaban (ELIQUIS) 5 MG TABS tablet Take 1 tablet (5 mg total) by mouth 2 (two) times daily. 06/11/16   Alisa Graff, FNP  ?atorvastatin (LIPITOR) 40 MG tablet Take 1 tablet (40 mg total) by mouth daily at 6 PM. 06/11/16   Alisa Graff, FNP  ?blood glucose meter kit and supplies KIT Dispense based on patient and insurance preference. Use up to four times daily as directed. 01/20/21   Manuella Ghazi, Pratik D, DO  ?ENTRESTO 97-103 MG Take 1 tablet by mouth 2 (two) times daily. 03/30/21   [provider]  ?ferrous sulfate 325 (65 FE) MG tablet Take 1 tablet (325 mg total) by mouth daily. 01/20/21 01/20/22  Manuella Ghazi, Pratik D, DO  ?furosemide (LASIX) 40 MG tablet Take 40 mg by mouth daily. 06/05/21   [provider]  ?guaiFENesin-dextromethorphan (ROBITUSSIN DM) 100-10 MG/5ML syrup Take 5 mLs by mouth every 4 (four) hours as needed for cough. 01/20/21   Manuella Ghazi, Pratik D, DO  ?LOKELMA 10 g PACK packet Take 1 packet by mouth daily. 03/27/21   [provider]  ?pantoprazole (PROTONIX) 40 MG tablet Take 1 tablet (40 mg total) by mouth daily. 01/20/21 01/20/22  Manuella Ghazi, Pratik D, DO  ?predniSONE (DELTASONE) 20 MG tablet Take 60 mg by mouth daily. 05/04/21   [provider]  ?spironolactone (ALDACTONE) 25 MG tablet Take 25 mg by mouth daily. 04/06/21   [provider]  ?sulfamethoxazole-trimethoprim (BACTRIM DS) 800-160 MG tablet Take 1 tablet by mouth in the morning and at bedtime. 06/15/21    [provider]  ? ? ?Physical Exam:  ? ?Vitals:  ? 06/22/21 1215 06/22/21 1217 06/22/21 1223 06/22/21 1255  ?BP:  100/62    ?Pulse: (!) 119     ?Resp: (!) 30 (!) 24    ?Temp: 98.3 ?F (36.8 ?C)   99.1 ?F (37.3 ?C)  ?TempSrc: Oral   Rectal  ?SpO2: 95% 95%    ?Weight:   72.6 kg   ?Height:   '5\' 6"'$  (1.676 m)   ? ? ? ?Physical Exam: ?Blood pressure 100/62, pulse (!) 119, temperature 99.1 ?F (37.3 ?C), temperature source Rectal, resp. rate (!) 24, height $RemoveBe'5\' 6"'eZyIVEedQ$  (1.676 m), weight 72.6 kg, SpO2  95 %. ?Gen: Patient lying in bed with tachypnea and increased work of breathing, patient only able to speak in 1-2 words before having to take a breath.  Attentive daughter at bedside. ?CVS: S1-S2, tachycardic ?Respiratory: Reasonable air entry with scattered rhonchi, no wheezes noted. ?GI: NABS, soft, NT  ?LE: No edema. No cyanosis ?Neuro: A/O x 3,grossly nonfocal.  ?Psych: mood and affect appropriate to situation. ? ? ?Data Review:  ? ? ?Labs: ?Basic Metabolic Panel: ?Recent Labs  ?Lab 06/22/21 ?1240  ?NA 136  ?K 5.7*  ?CL 112*  ?CO2 10*  ?GLUCOSE 204*  ?BUN 104*  ?CREATININE 4.47*  ?CALCIUM 8.5*  ? ?Liver Function Tests: ?Recent Labs  ?Lab 06/22/21 ?1240  ?AST 24  ?ALT 14  ?ALKPHOS 48  ?BILITOT 0.8  ?PROT 6.5  ?ALBUMIN 3.0*  ? ?Recent Labs  ?Lab 06/22/21 ?1240  ?LIPASE 27  ? ?No results for input(s): AMMONIA in the last 168 hours. ?CBC: ?Recent Labs  ?Lab 06/22/21 ?1240  ?WBC 5.4  ?NEUTROABS 1.3*  ?HGB 11.0*  ?HCT 35.5*  ?MCV 97.0  ?PLT 129*  ? ?Cardiac Enzymes: ?No results for input(s): CKTOTAL, CKMB, CKMBINDEX, TROPONINI in the last 168 hours. ? ?BNP (last 3 results) ?No results for input(s): PROBNP in the last 8760 hours. ?CBG: ?No results for input(s): GLUCAP in the last 168 hours. ? ?Urinalysis ?   ?Component Value Date/Time  ? COLORURINE YELLOW (A) 06/22/2021 1240  ? APPEARANCEUR HAZY (A) 06/22/2021 1240  ? LABSPEC 1.013 06/22/2021 1240  ? PHURINE 5.0 06/22/2021 1240  ? GLUCOSEU NEGATIVE 06/22/2021 1240  ? HGBUR  NEGATIVE 06/22/2021 1240  ? [place] NEGATIVE 06/22/2021 1240  ? [place] NEGATIVE 06/22/2021 1240  ? PROTEINUR 30 (A) 06/22/2021 1240  ? NITRITE NEGATIVE 06/22/2021 1240  ? LEUKOCYTESUR NEGATIVE 06/23/18

## 2021-06-22 NOTE — Sepsis Progress Note (Signed)
Notified provider of need to order repeat lactic acid. ° °

## 2021-06-22 NOTE — Consult Note (Signed)
Pharmacy Antibiotic Note ? ?Mackenzie [name] is a 71 y.o. female admitted on 06/22/2021 with sepsis.  Pharmacy has been consulted for Vancomycin and Cefepime dosing. ? ?Plan: ?Give Vancomycin '1500mg'$  x1 followed by dosing per levels. Random level ordered tomorrow at 1600 (24hours post-load) ?Give Cefepime 2g IV every 24 hours ? ? ?Height: '5\' 6"'$  (167.6 cm) ?Weight: 72.6 kg (160 lb 0.9 oz) ?IBW/kg (Calculated) : 59.3 ? ?Temp (24hrs), Avg:98.7 ?F (37.1 ?C), Min:98.3 ?F (36.8 ?C), Max:99.1 ?F (37.3 ?C) ? ?Recent Labs  ?Lab 06/22/21 ?1240  ?WBC 5.4  ?CREATININE 4.47*  ?LATICACIDVEN 4.4*  ?  ?Estimated Creatinine Clearance: 11.9 mL/min (A) (by C-G formula based on SCr of 4.47 mg/dL (H)).   ? ?No Known Allergies ? ?Antimicrobials this admission: ?Rocephin + Azithromycin x1 ED ? ?Dose adjustments this admission: ? ? ?Microbiology results: ?23 BCx: sent ?0505 UCx: sent  ? ? ?Thank you for allowing pharmacy to be a part of this patient?s care. ? ?Darrick Penna ?06/22/2021 4:08 PM ? ?

## 2021-06-22 NOTE — Progress Notes (Signed)
?   06/22/21 1220  ?Clinical Encounter Type  ?Visited With Patient and family together  ?Visit Type Initial  ?Referral From Nurse  ?Consult/Referral To Chaplain  ?Advance Directives (For Healthcare)  ?Does Patient Have a Medical Advance Directive? No  ?Would patient like information on creating a medical advance directive? No - Patient declined  ?Mental Health Advance Directives  ?Does Patient Have a Mental Health Advance Directive? No  ? ?Chaplain responded to Code Stemi. Chaplain escorted daughter to room. Chaplain provided compassionate presence and reflective listening while daughter spoke about current situation. Chaplain will follow up as needs are assessed. ?

## 2021-06-23 ENCOUNTER — Encounter: Payer: Self-pay | Admitting: Internal Medicine

## 2021-06-23 DIAGNOSIS — N179 Acute kidney failure, unspecified: Secondary | ICD-10-CM

## 2021-06-23 DIAGNOSIS — N1831 Chronic kidney disease, stage 3a: Secondary | ICD-10-CM

## 2021-06-23 DIAGNOSIS — I5032 Chronic diastolic (congestive) heart failure: Secondary | ICD-10-CM | POA: Diagnosis present

## 2021-06-23 DIAGNOSIS — E875 Hyperkalemia: Secondary | ICD-10-CM

## 2021-06-23 DIAGNOSIS — I4891 Unspecified atrial fibrillation: Secondary | ICD-10-CM

## 2021-06-23 DIAGNOSIS — J189 Pneumonia, unspecified organism: Secondary | ICD-10-CM

## 2021-06-23 DIAGNOSIS — E872 Acidosis, unspecified: Secondary | ICD-10-CM | POA: Diagnosis present

## 2021-06-23 DIAGNOSIS — I10 Essential (primary) hypertension: Secondary | ICD-10-CM

## 2021-06-23 LAB — COMPREHENSIVE METABOLIC PANEL
ALT: 15 U/L (ref 0–44)
AST: 24 U/L (ref 15–41)
Albumin: 2.1 g/dL — ABNORMAL LOW (ref 3.5–5.0)
Alkaline Phosphatase: 37 U/L — ABNORMAL LOW (ref 38–126)
Anion gap: 12 (ref 5–15)
BUN: 96 mg/dL — ABNORMAL HIGH (ref 8–23)
CO2: 14 mmol/L — ABNORMAL LOW (ref 22–32)
Calcium: 7.5 mg/dL — ABNORMAL LOW (ref 8.9–10.3)
Chloride: 113 mmol/L — ABNORMAL HIGH (ref 98–111)
Creatinine, Ser: 4.19 mg/dL — ABNORMAL HIGH (ref 0.44–1.00)
GFR, Estimated: 11 mL/min — ABNORMAL LOW (ref >60)
Glucose, Bld: 147 mg/dL — ABNORMAL HIGH (ref 70–99)
Potassium: 5.7 mmol/L — ABNORMAL HIGH (ref 3.5–5.1)
Sodium: 139 mmol/L (ref 135–145)
Total Bilirubin: 0.8 mg/dL (ref 0.3–1.2)
Total Protein: 5 g/dL — ABNORMAL LOW (ref 6.5–8.1)

## 2021-06-23 LAB — BLOOD CULTURE ID PANEL (REFLEXED) - BCID2

## 2021-06-23 LAB — SURGICAL PCR SCREEN
MRSA, PCR: NEGATIVE
Staphylococcus aureus: NEGATIVE

## 2021-06-23 LAB — CBC
HCT: 27.1 % — ABNORMAL LOW (ref 36.0–46.0)
Hemoglobin: 8.6 g/dL — ABNORMAL LOW (ref 12.0–15.0)
MCH: 30.4 pg (ref 26.0–34.0)
MCHC: 31.7 g/dL (ref 30.0–36.0)
MCV: 95.8 fL (ref 80.0–100.0)
Platelets: 114 10*3/uL — ABNORMAL LOW (ref 150–400)
RBC: 2.83 MIL/uL — ABNORMAL LOW (ref 3.87–5.11)
RDW: 14.7 % (ref 11.5–15.5)
WBC: 7.7 10*3/uL (ref 4.0–10.5)
nRBC: 0 % (ref 0.0–0.2)

## 2021-06-23 LAB — BRAIN NATRIURETIC PEPTIDE: B Natriuretic Peptide: 224.3 pg/mL — ABNORMAL HIGH (ref 0.0–100.0)

## 2021-06-23 LAB — URINE CULTURE: Culture: NO GROWTH

## 2021-06-23 LAB — CBG MONITORING, ED: Glucose-Capillary: 122 mg/dL — ABNORMAL HIGH (ref 70–99)

## 2021-06-23 LAB — GLUCOSE, CAPILLARY
Glucose-Capillary: 151 mg/dL — ABNORMAL HIGH (ref 70–99)
Glucose-Capillary: 139 mg/dL — ABNORMAL HIGH (ref 70–99)
Glucose-Capillary: 131 mg/dL — ABNORMAL HIGH (ref 70–99)

## 2021-06-23 LAB — PROCALCITONIN: Procalcitonin: 76.05 ng/mL

## 2021-06-23 LAB — LACTIC ACID, PLASMA: Lactic Acid, Venous: 1.2 mmol/L (ref 0.5–1.9)

## 2021-06-23 LAB — VANCOMYCIN, RANDOM: Vancomycin Rm: 22

## 2021-06-23 MED ORDER — SODIUM CHLORIDE 0.9 % IV SOLN
2.0000 g | Freq: Once | INTRAVENOUS | Status: DC
  Filled 2021-06-23 (×2): qty 20

## 2021-06-23 MED ORDER — SODIUM BICARBONATE 650 MG PO TABS
1300.0000 mg | ORAL_TABLET | Freq: Two times a day (BID) | ORAL | Status: DC
  Administered 2021-06-23 – 2021-06-24 (×2): 1300 mg via ORAL
  Filled 2021-06-23 (×2): qty 2

## 2021-06-23 MED ORDER — METOPROLOL TARTRATE 25 MG PO TABS
12.5000 mg | ORAL_TABLET | Freq: Two times a day (BID) | ORAL | Status: DC
  Administered 2021-06-23 – 2021-06-24 (×2): 12.5 mg via ORAL
  Filled 2021-06-23 (×2): qty 1

## 2021-06-23 MED ORDER — PATIROMER SORBITEX CALCIUM 8.4 G PO PACK
16.8000 g | PACK | Freq: Every day | ORAL | Status: DC
  Administered 2021-06-23 – 2021-06-24 (×2): 16.8 g via ORAL
  Filled 2021-06-23 (×3): qty 2

## 2021-06-23 MED ORDER — SODIUM CHLORIDE 0.9 % IV SOLN
2.0000 g | INTRAVENOUS | Status: DC
  Administered 2021-06-23: 2 g via INTRAVENOUS
  Filled 2021-06-23 (×2): qty 20

## 2021-06-23 MED ORDER — SODIUM POLYSTYRENE SULFONATE 15 GM/60ML PO SUSP
30.0000 g | Freq: Once | ORAL | Status: AC
  Administered 2021-06-23: 30 g via ORAL
  Filled 2021-06-23: qty 120

## 2021-06-23 NOTE — Progress Notes (Signed)
PHARMACY - PHYSICIAN COMMUNICATION ?CRITICAL VALUE ALERT - BLOOD CULTURE IDENTIFICATION (BCID) ? ? BCID results:  1 set (both bottles) of 2 with E. Coli, no resistance.  Pt on Cefepime and Vancomycin for sepsis secondary to multifocal pneumonia.  ? ?Name of provider contacted: Rachael Fee, NP ? ?Changes to prescribed antibiotics required: Transition Cefepime to Ceftriaxone 2 gm, continue Vancomycin at this time. ? ?Renda Rolls, PharmD, MBA ?06/23/2021 ?3:58 AM ? ? ?

## 2021-06-23 NOTE — Assessment & Plan Note (Signed)
Patient had known anion gap metabolic acidosis most likely secondary to sepsis with lactic acidosis and CKD.  Received IV bicarb in ED. ?A.m. bicarb at 14 ?-Start her on p.o. bicarb supplement ?-Continue to monitor ?

## 2021-06-23 NOTE — Assessment & Plan Note (Signed)
Hemoglobin at 8.6 this morning, close to her baseline.  It was 11 on admission, most likely concentrated. ?-Continue to monitor ?-Transfuse if below 7 ?

## 2021-06-23 NOTE — Assessment & Plan Note (Signed)
Most likely secondary to sepsis.  Creatinine with slight improvement to 4.19. ?Fluctuating creatinine between 3-5 over the past few months. ?Being followed up by [place] [facility]. ?-Continue to monitor ?-Avoid nephrotoxins ?

## 2021-06-23 NOTE — Assessment & Plan Note (Addendum)
Patient met severe sepsis criteria with tachycardia, tachypnea and lactic acidosis above 5.  Chest imaging with concern of multifocal pneumonia. ?Patient received broad-spectrum antibiotics which narrowed down to ceftriaxone and Zithromax. ?Preliminary blood cultures with E. Coli.  Urine cultures pending ?Urine cultures pending.  Lactic acid remained elevated. ?-Trend lactic acid ?-Check procalcitonin ?-Continue with ceftriaxone and Zithromax. ?-Continue with supportive care ?-Follow-up cultures ?

## 2021-06-23 NOTE — ED Notes (Signed)
Report received from [place], [place] ?

## 2021-06-23 NOTE — Assessment & Plan Note (Addendum)
Potassium at 5.7, most likely is secondary to AKI with CKD stage IV. ?Received 1 dose of Lokelma-potassium remains at 5.7. 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  Patient was also on spironolactone at home. ?-Give her some Kayexalate ?-Daily Veltassa ?-Continue to monitor ?-If remains elevated we will consult nephrology. ?

## 2021-06-23 NOTE — Assessment & Plan Note (Signed)
Blood pressure mildly elevated now. ?Patient was on amlodipine, Lasix and spironolactone at home ?Initial home antihypertensives were held due to concern of sepsis. ?-Her 1 dose of IV Lasix ?-Start her on low-dose metoprolol. ?-Keep holding home amlodipine-we will restart if needed. ?-Keep holding home spironolactone for concern of hyperkalemia ?

## 2021-06-23 NOTE — Assessment & Plan Note (Signed)
Patient with history of atrial fibrillation/atrial flutter with variable conduction with LBBB.  Mildly elevated troponin likely due to demand ischemia. ?Mild tachycardia. ?-Restart home beta-blocker ?-Continue with Eliquis ?

## 2021-06-23 NOTE — Assessment & Plan Note (Addendum)
Echocardiogram November 2022 shows EF of 60% with asymmetrical LV thickening consistent with diastolic dysfunction grade 2. ?Patient was on p.o. Lasix and spironolactone at home. ?-Given one-time dose of IV Lasix due to basal crackles ?-Holding spironolactone due to hyperkalemia ?-Continue to monitor ?

## 2021-06-23 NOTE — ED Notes (Signed)
Daughter at the bedside, swabbed pt's lips and provided chapstick.  ?

## 2021-06-23 NOTE — Progress Notes (Incomplete)
Patient patient is A-flutter with some labored shallow breathing with resp of 24. Patient is on 2L O2 via [place]. ?

## 2021-06-23 NOTE — Progress Notes (Signed)
?Progress Note ? ? ?Patient: Mackenzie [name] MGQ:[id number] DOB: [birth year] DOA: 06/22/2021     1 ?DOS: the patient was seen and examined on 06/23/2021 ?  ?Brief hospital course: ?Taken from H&P. ? ?Mackenzie [name] is an 71 y.o. female with HTN, CAD, DM 2, HFpEF, CKD IV and atrial fibrillation was in [facility] until 1 to 2 weeks ago when she started having progressive fatigue and malaise.  She was seen in PCP office and was treated for UTI.  However the medication caused nausea and vomiting and diarrhea so she only took 1 dose and then stop taking it. ?  ?Her daughter noticed that over the past 2 days patient has been sleeping and in the morning which is unusual for her.  Usually she is up and about helping with her grandkids but has stayed in bed the last couple of days.  Last night and into early this morning patient had progressive increase in shortness of breath and progressive increase in cough and was brought to the ED.  Patient denies fevers but is not sure.  No further nausea vomiting or diarrhea since she stopped taking the OTC antibiotic for UTI. ? ?On arrival to ED she was markedly hypertensive and tachypneic.  EMS initially called as STEMI however patient was seen by cardiology and diagnosed as not having any STEMI just LBBB. ?Further work-up with concern of multifocal pneumonia, bland urine sediment. ?Initial troponin elevated at 170 with a downward trend to 145.  No chest pain. ?Also found to have creatinine at 4.47 which is close to her baseline.  Potassium of 5.7 which remained at that level despite getting 1 dose of Lokelma. ?She was also had known anion gap metabolic acidosis, most likely secondary to lactic acidosis and AKI, received IV bicarb in the ED. ?1 set of blood cultures with E. Coli. ?Urine cultures pending ?MRSA swab negative. ? ?CT chest, abdomen and pelvis with large bilateral airspace opacities, right greater than left, consistent with multifocal pneumonia.  No acute abnormality noted in the  abdomen or pelvis. ? ?Patient was started on cefepime, vancomycin and Zithromax.  And later switched to ceftriaxone and Zithromax. ?Patient also received aggressive IV fluid per sepsis protocol. ? ?5/6: Patient was still feeling short of breath, saturating well on 4 L of oxygen, no baseline oxygen use.  On exam she was having bilateral basal crackles and trace lower extremity edema. ?Procalcitonin, BNP ordered.  IV fluid discontinued. ?Giving one-time dose of IV Lasix. ?Starting her on p.o. bicarb supplement. ?  ? ? ? ? ?Assessment and Plan: ?* Sepsis ([place]) ?Patient met severe sepsis criteria with tachycardia, tachypnea and lactic acidosis above 5.  Chest imaging with concern of multifocal pneumonia. ?Patient received broad-spectrum antibiotics which narrowed down to ceftriaxone and Zithromax. ?Preliminary blood cultures with E. Coli.  Urine cultures pending ?Urine cultures pending.  Lactic acid remained elevated. ?-Trend lactic acid ?-Check procalcitonin ?-Continue with ceftriaxone and Zithromax. ?-Continue with supportive care ?-Follow-up cultures ? ?Metabolic acidosis ?Patient had known anion gap metabolic acidosis most likely secondary to sepsis with lactic acidosis and CKD.  Received IV bicarb in ED. ?A.m. bicarb at 14 ?-Start her on p.o. bicarb supplement ?-Continue to monitor ? ?Acute on chronic renal failure (HCC) ?Most likely secondary to sepsis.  Creatinine with slight improvement to 4.19. ?Fluctuating creatinine between 3-5 over the past few months. ?Being followed up by [facility]. ?-Continue to monitor ?-Avoid nephrotoxins ? ?Hyperkalemia ?Potassium at 5.7, most likely is secondary to AKI  with CKD stage IV. ?Received 1 dose of Lokelma-potassium remains at 5.7. ?Apparently patient had chronic hyperkalemia and used to take [facility] every other day which was recently stopped.  Patient was also on spironolactone at home. ?-Give her some Kayexalate ?-Daily Veltassa ?-Continue to monitor ?-If remains  elevated we will consult nephrology. ? ?Atrial fibrillation ([place]) ?Patient with history of atrial fibrillation/atrial flutter with variable conduction with LBBB.  Mildly elevated troponin likely due to demand ischemia. ?Mild tachycardia. ?-Restart home beta-blocker ?-Continue with Eliquis ? ?Chronic diastolic CHF (congestive heart failure) ([place]) ?Echocardiogram November 2022 shows EF of 60% with asymmetrical LV thickening consistent with diastolic dysfunction grade 2. ?Patient was on p.o. Lasix and spironolactone at home. ?-Given one-time dose of IV Lasix due to basal crackles ?-Holding spironolactone due to hyperkalemia ?-Continue to monitor ? ?HTN (hypertension) ?Blood pressure mildly elevated now. ?Patient was on amlodipine, Lasix and spironolactone at home ?Initial home antihypertensives were held due to concern of sepsis. ?-Her 1 dose of IV Lasix ?-Start her on low-dose metoprolol. ?-Keep holding home amlodipine-we will restart if needed. ?-Keep holding home spironolactone for concern of hyperkalemia ? ?Normocytic anemia ?Hemoglobin at 8.6 this morning, close to her baseline.  It was 11 on admission, most likely concentrated. ?-Continue to monitor ?-Transfuse if below 7 ? ? ?Subjective: Patient continued to feel short of breath.  Coughing improved.  Not on any oxygen at baseline.  Denies any fever or chills.  Daughter at bedside. ? ?Physical Exam: ?Vitals:  ? 06/23/21 0742 06/23/21 1000 06/23/21 1200 06/23/21 1219  ?BP: 137/68 (!) 152/66 (!) 148/70 (!) 148/70  ?Pulse: (!) 105 (!) 105 (!) 104 (!) 105  ?Resp: (!) 29 (!) 25 (!) 24 19  ?Temp: 98.4 ?F (36.9 ?C)  98.6 ?F (37 ?C) 98.6 ?F (37 ?C)  ?TempSrc: Oral  Oral   ?SpO2: 97% 96%  98%  ?Weight:      ?Height:      ? ?General.  Ill-appearing elderly lady, in no acute distress. ?Pulmonary.  Bilateral basal crackles, normal respiratory effort. ?CV.  Regular rate and rhythm, no JVD, rub or murmur. ?Abdomen.  Soft, nontender, nondistended, BS positive. ?CNS.  Alert and  oriented .  No focal neurologic deficit. ?Extremities.  Trace LE edema, no cyanosis, pulses intact and symmetrical. ?Psychiatry.  Judgment and insight appears normal. ? ?Data Reviewed: ?Prior notes, labs and images reviewed ? ?Family Communication: Discussed with daughter at bedside ? ?Disposition: ?Status is: Inpatient ?Remains inpatient appropriate because: Severity of illness ? ? Planned Discharge Destination: Home ? ?DVT prophylaxis.  Eliquis ?Time spent: 50 minutes ? ?This record has been created using Systems analyst. Errors have been sought and corrected,but may not always be located. Such creation errors do not reflect on the standard of care. ? ?Author: ?Lorella Nimrod, MD ?06/23/2021 4:03 PM ? ?For on call review [email or web address].  ?

## 2021-06-23 NOTE — Hospital Course (Addendum)
Taken from H&P. ? ?Mackenzie [name] is an 71 y.o. female with HTN, CAD, DM 2, HFpEF, CKD IV and atrial fibrillation was in [facility] until 1 to 2 weeks ago when she started having progressive fatigue and malaise.  She was seen in PCP office and was treated for UTI.  However the medication caused nausea and vomiting and diarrhea so she only took 1 dose and then stop taking it. ?  ?Her daughter noticed that over the past 2 days patient has been sleeping and in the morning which is unusual for her.  Usually she is up and about helping with her grandkids but has stayed in bed the last couple of days.  Last night and into early this morning patient had progressive increase in shortness of breath and progressive increase in cough and was brought to the ED.  Patient denies fevers but is not sure.  No further nausea vomiting or diarrhea since she stopped taking the OTC antibiotic for UTI. ? ?On arrival to ED she was markedly hypertensive and tachypneic.  EMS initially called as STEMI however patient was seen by cardiology and diagnosed as not having any STEMI just LBBB. ?Further work-up with concern of multifocal pneumonia, bland urine sediment. ?Initial troponin elevated at 170 with a downward trend to 145.  No chest pain. ?Also found to have creatinine at 4.47 which is close to her baseline.  Potassium of 5.7 which remained at that level despite getting 1 dose of Lokelma. ?She was also had known anion gap metabolic acidosis, most likely secondary to lactic acidosis and AKI, received IV bicarb in the ED. ?1 set of blood cultures with E. Coli. ?Urine cultures negative. ?MRSA swab negative. ? ?CT chest, abdomen and pelvis with large bilateral airspace opacities, right greater than left, consistent with multifocal pneumonia.  No acute abnormality noted in the abdomen or pelvis. ? ?Patient was started on cefepime, vancomycin and Zithromax.  And later switched to ceftriaxone and Zithromax. ?Patient also received aggressive IV fluid  per sepsis protocol. ? ?5/6: Patient was still feeling short of breath, saturating well on 4 L of oxygen, no baseline oxygen use.  On exam she was having bilateral basal crackles and trace lower extremity edema. ?Procalcitonin, BNP ordered.  IV fluid discontinued. ?Giving one-time dose of IV Lasix. ?Starting her on p.o. bicarb supplement. ? ?5/7: BNP elevated at 224.  Oxygen requirement improved after giving 1 dose of Lasix yesterday. ?Continues to have significant cough, no sputum production or hemoptysis. ?Continues to have significant shortness of breath. ?Worsening procalcitonin, at 80 today.  Lactic acidosis has been resolved.  Hyperkalemia resolved. ?Slight worsening of creatinine, at 4.27 with BUN of 100 today. 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  Patient was placed on rituximab and prednisone by his nephrologist.  Per family member prednisone dose was recently decreased to 20 mg daily. ?Due to worsening cough and respiratory symptoms and her immunocompromise state, antibiotics were broadened to cefepime for pseudomonal coverage. ?No sputum production for culture. ?Also concern of vasculitis associated lung involvement. ? ?Patient and family was requesting transfer to [facility] as they wanted to see there own nephrologist and was very concerned about worsening renal function. ?We consulted with our nephrologist and after discussion with patient and family, decided to transfer to [facility] as patient might need higher level of care and involvement of pulmonology along with nephrology. ?We kept her on 20 mg of  prednisone for concern of worsening superadded bacterial infection. ? ?Patient is being transferred to [facility] for continuity and further management. ? ?  ? ? ?

## 2021-06-24 ENCOUNTER — Encounter (HOSPITAL_COMMUNITY): Payer: Self-pay | Admitting: Internal Medicine

## 2021-06-24 ENCOUNTER — Inpatient Hospital Stay (HOSPITAL_COMMUNITY)
Admission: AD | Admit: 2021-06-24 | Discharge: 2021-07-02 | DRG: 673 | Disposition: A | Discharge status: Home Health | Payer: Medicare Other | Source: Other Acute Inpatient Hospital | Attending: Internal Medicine | Admitting: Internal Medicine

## 2021-06-24 DIAGNOSIS — N390 Urinary tract infection, site not specified: Secondary | ICD-10-CM | POA: Diagnosis present

## 2021-06-24 DIAGNOSIS — I2510 Atherosclerotic heart disease of native coronary artery without angina pectoris: Secondary | ICD-10-CM | POA: Diagnosis present

## 2021-06-24 DIAGNOSIS — M1990 Unspecified osteoarthritis, unspecified site: Secondary | ICD-10-CM | POA: Diagnosis present

## 2021-06-24 DIAGNOSIS — E1122 Type 2 diabetes mellitus with diabetic chronic kidney disease: Secondary | ICD-10-CM | POA: Diagnosis present

## 2021-06-24 DIAGNOSIS — I10 Essential (primary) hypertension: Secondary | ICD-10-CM | POA: Diagnosis present

## 2021-06-24 DIAGNOSIS — R6521 Severe sepsis with septic shock: Secondary | ICD-10-CM | POA: Diagnosis not present

## 2021-06-24 DIAGNOSIS — J189 Pneumonia, unspecified organism: Secondary | ICD-10-CM | POA: Diagnosis present

## 2021-06-24 DIAGNOSIS — Z79899 Other long term (current) drug therapy: Secondary | ICD-10-CM | POA: Diagnosis not present

## 2021-06-24 DIAGNOSIS — I5033 Acute on chronic diastolic (congestive) heart failure: Secondary | ICD-10-CM | POA: Diagnosis present

## 2021-06-24 DIAGNOSIS — N184 Chronic kidney disease, stage 4 (severe): Secondary | ICD-10-CM | POA: Diagnosis not present

## 2021-06-24 DIAGNOSIS — B9620 Unspecified Escherichia coli [E. coli] as the cause of diseases classified elsewhere: Secondary | ICD-10-CM | POA: Diagnosis not present

## 2021-06-24 DIAGNOSIS — I132 Hypertensive heart and chronic kidney disease with heart failure and with stage 5 chronic kidney disease, or end stage renal disease: Secondary | ICD-10-CM | POA: Diagnosis present

## 2021-06-24 DIAGNOSIS — N058 Unspecified nephritic syndrome with other morphologic changes: Secondary | ICD-10-CM | POA: Diagnosis present

## 2021-06-24 DIAGNOSIS — D631 Anemia in chronic kidney disease: Secondary | ICD-10-CM | POA: Diagnosis present

## 2021-06-24 DIAGNOSIS — R6520 Severe sepsis without septic shock: Secondary | ICD-10-CM | POA: Diagnosis present

## 2021-06-24 DIAGNOSIS — Z833 Family history of diabetes mellitus: Secondary | ICD-10-CM | POA: Diagnosis not present

## 2021-06-24 DIAGNOSIS — Z794 Long term (current) use of insulin: Secondary | ICD-10-CM

## 2021-06-24 DIAGNOSIS — E875 Hyperkalemia: Secondary | ICD-10-CM | POA: Diagnosis present

## 2021-06-24 DIAGNOSIS — Z8249 Family history of ischemic heart disease and other diseases of the circulatory system: Secondary | ICD-10-CM

## 2021-06-24 DIAGNOSIS — I447 Left bundle-branch block, unspecified: Secondary | ICD-10-CM | POA: Diagnosis present

## 2021-06-24 DIAGNOSIS — E119 Type 2 diabetes mellitus without complications: Secondary | ICD-10-CM

## 2021-06-24 DIAGNOSIS — I7782 Antineutrophilic cytoplasmic antibody (ANCA) vasculitis: Secondary | ICD-10-CM | POA: Diagnosis present

## 2021-06-24 DIAGNOSIS — I4891 Unspecified atrial fibrillation: Secondary | ICD-10-CM | POA: Diagnosis not present

## 2021-06-24 DIAGNOSIS — N186 End stage renal disease: Secondary | ICD-10-CM | POA: Diagnosis present

## 2021-06-24 DIAGNOSIS — J9601 Acute respiratory failure with hypoxia: Secondary | ICD-10-CM | POA: Diagnosis present

## 2021-06-24 DIAGNOSIS — A419 Sepsis, unspecified organism: Secondary | ICD-10-CM | POA: Diagnosis not present

## 2021-06-24 DIAGNOSIS — N3000 Acute cystitis without hematuria: Secondary | ICD-10-CM

## 2021-06-24 DIAGNOSIS — D649 Anemia, unspecified: Secondary | ICD-10-CM | POA: Diagnosis present

## 2021-06-24 DIAGNOSIS — E785 Hyperlipidemia, unspecified: Secondary | ICD-10-CM | POA: Diagnosis present

## 2021-06-24 DIAGNOSIS — Z7901 Long term (current) use of anticoagulants: Secondary | ICD-10-CM

## 2021-06-24 DIAGNOSIS — I5032 Chronic diastolic (congestive) heart failure: Secondary | ICD-10-CM | POA: Diagnosis present

## 2021-06-24 DIAGNOSIS — I151 Hypertension secondary to other renal disorders: Secondary | ICD-10-CM

## 2021-06-24 DIAGNOSIS — N2889 Other specified disorders of kidney and ureter: Secondary | ICD-10-CM

## 2021-06-24 DIAGNOSIS — A4151 Sepsis due to Escherichia coli [E. coli]: Secondary | ICD-10-CM | POA: Diagnosis present

## 2021-06-24 DIAGNOSIS — Z803 Family history of malignant neoplasm of breast: Secondary | ICD-10-CM | POA: Diagnosis not present

## 2021-06-24 DIAGNOSIS — I480 Paroxysmal atrial fibrillation: Secondary | ICD-10-CM | POA: Diagnosis present

## 2021-06-24 DIAGNOSIS — N179 Acute kidney failure, unspecified: Secondary | ICD-10-CM | POA: Diagnosis present

## 2021-06-24 DIAGNOSIS — R7881 Bacteremia: Secondary | ICD-10-CM | POA: Diagnosis present

## 2021-06-24 DIAGNOSIS — E872 Acidosis, unspecified: Secondary | ICD-10-CM | POA: Diagnosis not present

## 2021-06-24 HISTORY — DX: Type 2 diabetes mellitus without complications: E11.9

## 2021-06-24 LAB — BASIC METABOLIC PANEL
Anion gap: 15 (ref 5–15)
BUN: 100 mg/dL — ABNORMAL HIGH (ref 8–23)
CO2: 15 mmol/L — ABNORMAL LOW (ref 22–32)
Calcium: 7.4 mg/dL — ABNORMAL LOW (ref 8.9–10.3)
Chloride: 114 mmol/L — ABNORMAL HIGH (ref 98–111)
Creatinine, Ser: 4.27 mg/dL — ABNORMAL HIGH (ref 0.44–1.00)
GFR, Estimated: 11 mL/min — ABNORMAL LOW (ref >60)
Glucose, Bld: 105 mg/dL — ABNORMAL HIGH (ref 70–99)
Potassium: 4.7 mmol/L (ref 3.5–5.1)
Sodium: 144 mmol/L (ref 135–145)

## 2021-06-24 LAB — GLUCOSE, CAPILLARY
Glucose-Capillary: 106 mg/dL — ABNORMAL HIGH (ref 70–99)
Glucose-Capillary: 158 mg/dL — ABNORMAL HIGH (ref 70–99)
Glucose-Capillary: 196 mg/dL — ABNORMAL HIGH (ref 70–99)
Glucose-Capillary: 153 mg/dL — ABNORMAL HIGH (ref 70–99)

## 2021-06-24 LAB — PROCALCITONIN: Procalcitonin: 80.02 ng/mL

## 2021-06-24 MED ORDER — PREDNISONE 20 MG PO TABS: 20.0000 mg | ORAL_TABLET | Freq: Every day | ORAL | Status: DC

## 2021-06-24 MED ORDER — AZITHROMYCIN 500 MG PO TABS: 500.0000 mg | ORAL_TABLET | Freq: Every day | ORAL | 0 refills | Status: DC

## 2021-06-24 MED ORDER — ACETAMINOPHEN 325 MG PO TABS: 650.0000 mg | ORAL_TABLET | Freq: Four times a day (QID) | ORAL | Status: DC | PRN

## 2021-06-24 MED ORDER — SODIUM CHLORIDE 0.9 % IV SOLN
2.0000 g | INTRAVENOUS | Status: DC
  Administered 2021-06-24: 2 g via INTRAVENOUS
  Filled 2021-06-24: qty 12.5

## 2021-06-24 MED ORDER — ALBUTEROL SULFATE (2.5 MG/3ML) 0.083% IN NEBU: 3.0000 mL | INHALATION_SOLUTION | Freq: Every day | RESPIRATORY_TRACT | Status: DC | PRN

## 2021-06-24 MED ORDER — ONDANSETRON HCL 4 MG PO TABS: 4.0000 mg | ORAL_TABLET | Freq: Four times a day (QID) | ORAL | 0 refills | Status: DC | PRN

## 2021-06-24 MED ORDER — PATIROMER SORBITEX CALCIUM 8.4 G PO PACK
16.8000 g | PACK | Freq: Every day | ORAL | Status: DC
  Administered 2021-06-25: 16.8 g via ORAL
  Filled 2021-06-24: qty 2

## 2021-06-24 MED ORDER — SODIUM CHLORIDE 0.9 % IV SOLN: 2.0000 g | INTRAVENOUS | Status: DC

## 2021-06-24 MED ORDER — APIXABAN 5 MG PO TABS
5.0000 mg | ORAL_TABLET | Freq: Two times a day (BID) | ORAL | Status: DC
  Administered 2021-06-24 – 2021-06-25 (×2): 5 mg via ORAL
  Filled 2021-06-24 (×2): qty 1

## 2021-06-24 MED ORDER — ONDANSETRON HCL 4 MG/2ML IJ SOLN: 4.0000 mg | Freq: Four times a day (QID) | INTRAMUSCULAR | 0 refills | Status: DC | PRN

## 2021-06-24 MED ORDER — SODIUM CHLORIDE 0.9 % IV SOLN
2.0000 g | INTRAVENOUS | Status: DC
  Administered 2021-06-24 – 2021-06-25 (×2): 2 g via INTRAVENOUS
  Filled 2021-06-24 (×2): qty 12.5

## 2021-06-24 MED ORDER — INSULIN ASPART 100 UNIT/ML IJ SOLN
0.0000 [IU] | Freq: Three times a day (TID) | INTRAMUSCULAR | Status: DC
  Administered 2021-06-25: 1 [IU] via SUBCUTANEOUS
  Administered 2021-06-28: 2 [IU] via SUBCUTANEOUS
  Administered 2021-06-28: 1 [IU] via SUBCUTANEOUS
  Administered 2021-06-30: 2 [IU] via SUBCUTANEOUS
  Administered 2021-07-01: 1 [IU] via SUBCUTANEOUS
  Administered 2021-07-01: 3 [IU] via SUBCUTANEOUS
  Administered 2021-07-02: 1 [IU] via SUBCUTANEOUS

## 2021-06-24 MED ORDER — AZITHROMYCIN 500 MG PO TABS
500.0000 mg | ORAL_TABLET | Freq: Every day | ORAL | Status: DC
  Administered 2021-06-25: 500 mg via ORAL
  Filled 2021-06-24: qty 1

## 2021-06-24 MED ORDER — INSULIN ASPART 100 UNIT/ML IJ SOLN: 0.0000 [IU] | Freq: Three times a day (TID) | INTRAMUSCULAR | 11 refills | Status: DC

## 2021-06-24 MED ORDER — METOPROLOL TARTRATE 12.5 MG HALF TABLET
12.5000 mg | ORAL_TABLET | Freq: Two times a day (BID) | ORAL | Status: DC
  Administered 2021-06-24 – 2021-07-02 (×9): 12.5 mg via ORAL
  Filled 2021-06-24 (×13): qty 1

## 2021-06-24 MED ORDER — IPRATROPIUM-ALBUTEROL 0.5-2.5 (3) MG/3ML IN SOLN
3.0000 mL | Freq: Four times a day (QID) | RESPIRATORY_TRACT | Status: DC
  Administered 2021-06-24 (×2): 3 mL via RESPIRATORY_TRACT
  Filled 2021-06-24 (×2): qty 3

## 2021-06-24 MED ORDER — SODIUM BICARBONATE 650 MG PO TABS
1300.0000 mg | ORAL_TABLET | Freq: Two times a day (BID) | ORAL | Status: DC
Start: 2021-06-24 — End: 2022-10-14

## 2021-06-24 MED ORDER — BENZONATATE 100 MG PO CAPS
100.0000 mg | ORAL_CAPSULE | Freq: Three times a day (TID) | ORAL | Status: DC | PRN
  Administered 2021-06-24 – 2021-06-27 (×5): 100 mg via ORAL
  Filled 2021-06-24 (×5): qty 1

## 2021-06-24 MED ORDER — IPRATROPIUM-ALBUTEROL 0.5-2.5 (3) MG/3ML IN SOLN: 3.0000 mL | Freq: Four times a day (QID) | RESPIRATORY_TRACT | Status: DC

## 2021-06-24 MED ORDER — ATORVASTATIN CALCIUM 40 MG PO TABS
40.0000 mg | ORAL_TABLET | Freq: Every day | ORAL | Status: DC
Start: 2021-06-25 — End: 2021-07-02
  Administered 2021-06-25 – 2021-07-01 (×7): 40 mg via ORAL
  Filled 2021-06-24 (×7): qty 1

## 2021-06-24 MED ORDER — ACETAMINOPHEN 325 MG PO TABS
650.0000 mg | ORAL_TABLET | Freq: Four times a day (QID) | ORAL | Status: DC | PRN
  Administered 2021-06-26: 650 mg via ORAL
  Filled 2021-06-24: qty 2

## 2021-06-24 MED ORDER — ACETAMINOPHEN 650 MG RE SUPP
650.0000 mg | Freq: Four times a day (QID) | RECTAL | Status: DC | PRN
Start: 2021-06-24 — End: 2021-07-02

## 2021-06-24 MED ORDER — METOPROLOL TARTRATE 25 MG PO TABS: 12.5000 mg | ORAL_TABLET | Freq: Two times a day (BID) | ORAL | Status: DC

## 2021-06-24 MED ORDER — BENZONATATE 100 MG PO CAPS: 100.0000 mg | ORAL_CAPSULE | Freq: Three times a day (TID) | ORAL | 0 refills | Status: DC | PRN

## 2021-06-24 MED ORDER — SODIUM BICARBONATE 650 MG PO TABS
1300.0000 mg | ORAL_TABLET | Freq: Two times a day (BID) | ORAL | Status: DC
  Administered 2021-06-24 – 2021-06-25 (×3): 1300 mg via ORAL
  Filled 2021-06-24 (×3): qty 2

## 2021-06-24 MED ORDER — PATIROMER SORBITEX CALCIUM 8.4 G PO PACK: 16.8000 g | PACK | Freq: Every day | ORAL | Status: DC

## 2021-06-24 MED ORDER — PREDNISONE 20 MG PO TABS
20.0000 mg | ORAL_TABLET | Freq: Every day | ORAL | Status: DC
  Administered 2021-06-25: 20 mg via ORAL
  Filled 2021-06-24: qty 1

## 2021-06-24 NOTE — Consult Note (Signed)
[facility] ?Elyse Hsu Note: 06/24/2021 ? ? ? ?Date of Admission:  06/22/2021    ? ?      ?Reason for Consult: AKI    ?Referring Provider: Lorella Nimrod, MD ?Primary Care Provider: [name], MD ? ? ?History of Presenting Illness:  ?Mackenzie [name] is a 71 y.o. female with multiple medical problems.  She is admitted for progressive fatigue and malaise.  Her family member reports that in March she was diagnosed with a UTI but did not take the antibiotics because it caused diarrhea. She is now admitted with bilateral pneumonia and blood cultures are positive for E. coli and Enterobacterales ?Patient also has renal insufficiency with creatinine of 4.27 today, BUN 100.  Potassium is now in the normal range, was high yesterday. ?She has acidosis with low bicarb of 15. ? ? ? ?Review of Systems: ?Review of Systems  ?Constitutional:  Positive for chills and malaise/fatigue. Negative for fever.  ?HENT:  Negative for hearing loss.   ?Eyes:  Negative for blurred vision.  ?Respiratory:  Positive for cough, shortness of breath and wheezing. Negative for hemoptysis and sputum production.   ?Cardiovascular:  Negative for chest pain.  ?Gastrointestinal:  Positive for nausea. Negative for blood in stool, heartburn and vomiting.  ?Genitourinary:  Negative for dysuria and urgency.  ?Musculoskeletal:  Negative for myalgias.  ?Skin:  Negative for rash.  ?Neurological:  Negative for dizziness.  ?Endo/Heme/Allergies:  Does not bruise/bleed easily.  ?Psychiatric/Behavioral:  Negative for depression.   ? ?Past Medical History:  ?Diagnosis Date  ? Arthritis   ? Atrial fibrillation ([place])   ? CHF (congestive heart failure) ([place])   ? HLD (hyperlipidemia)   ? Hypertension   ? ? ?Social History  ? ?Tobacco Use  ? Smoking status: Never  ? Smokeless tobacco: Never  ?Vaping Use  ? Vaping Use: Never used  ?Substance Use Topics  ? Alcohol use: No  ? Drug use: No  ? ? ?Family History  ?Problem Relation Age of Onset  ? Diabetes  Mother   ? Heart disease Mother   ? Heart attack Father   ? Diabetes Brother   ? Breast cancer Maternal Grandmother   ? Breast cancer Maternal Aunt   ? Colon cancer Neg Hx   ? Colon polyps Neg Hx   ? ? ? ?OBJECTIVE: ?Blood pressure 136/62, pulse (!) 101, temperature 98.1 ?F (36.7 ?C), resp. rate 17, height '5\' 6"'$  (1.676 m), weight 72.6 kg, SpO2 98 %. ? ?Physical Exam ?General appearance: Frail, elderly, laying in the bed ?HEENT: Moist oral mucous membranes ?Pulmonary: Bilateral wheezing and basilar crackles, [place] O2 ?Cardiovascular: Regular, no rub ?Abdomen: Soft, nontender ?Extremities: Trace edema ?Neuro: Alert, able to answer questions appropriately ?Skin: Warm, dry ? ?Lab Results ?Lab Results  ?Component Value Date  ? WBC 7.7 06/23/2021  ? HGB 8.6 (L) 06/23/2021  ? HCT 27.1 (L) 06/23/2021  ? MCV 95.8 06/23/2021  ? PLT 114 (L) 06/23/2021  ?  ?Lab Results  ?Component Value Date  ? CREATININE 4.27 (H) 06/24/2021  ? BUN 100 (H) 06/24/2021  ? NA 144 06/24/2021  ? K 4.7 06/24/2021  ? CL 114 (H) 06/24/2021  ? CO2 15 (L) 06/24/2021  ?  ?Lab Results  ?Component Value Date  ? ALT 15 06/23/2021  ? AST 24 06/23/2021  ? ALKPHOS 37 (L) 06/23/2021  ? BILITOT 0.8 06/23/2021  ?  ? ?Microbiology: ?Recent Results (from the past 240 hour(s))  ?Blood Culture (routine x 2)  Status: None (Preliminary result)  ? Collection Time: 06/22/21 12:40 PM  ? Specimen: BLOOD  ?Result Value Ref Range Status  ? Specimen Description   Final  ?  BLOOD LEFT ANTECUBITAL ?Performed at [facility], [address], [place], [place] [address] ?  ? Special Requests   Final  ?  BOTTLES DRAWN AEROBIC AND ANAEROBIC Blood Culture results may not be optimal due to an excessive volume of blood received in culture bottles ?Performed at [facility], [address], [place], [place] [address] ?  ? Culture  Setup Time   Final  ?  Organism ID to follow ?GRAM NEGATIVE RODS ?IN BOTH AEROBIC AND ANAEROBIC BOTTLES ?CRITICAL RESULT CALLED TO, READ  BACK BY AND VERIFIED WITH: ?NATHAN BELUE '@320'$  06/23/2021 TTG ?GRAM STAIN REVIEWED-AGREE WITH RESULT ?Performed at [facility], [place] [address] [place], [place] [address] ?  ? Culture GRAM NEGATIVE RODS  Final  ? Report Status PENDING  Incomplete  ?Urine Culture     Status: None  ? Collection Time: 06/22/21 12:40 PM  ? Specimen: In/Out Cath Urine  ?Result Value Ref Range Status  ? Specimen Description   Final  ?  IN/OUT CATH URINE ?Performed at [facility], [address] [place], [place] [address] ?  ? Special Requests   Final  ?  NONE ?Performed at [facility], [address] [place], [place] [address] ?  ? Culture   Final  ?  NO GROWTH ?Performed at [facility], [place] [address] [place], [place] [address] ?  ? Report Status 06/23/2021 FINAL  Final  ?Blood Culture ID Panel (Reflexed)     Status: Abnormal  ? Collection Time: 06/22/21 12:40 PM  ?Result Value Ref Range Status  ? Enterococcus faecalis NOT DETECTED NOT DETECTED Final  ? Enterococcus Faecium NOT DETECTED NOT DETECTED Final  ? Listeria monocytogenes NOT DETECTED NOT DETECTED Final  ? Staphylococcus species NOT DETECTED NOT DETECTED Final  ? Staphylococcus aureus (BCID) NOT DETECTED NOT DETECTED Final  ? Staphylococcus epidermidis NOT DETECTED NOT DETECTED Final  ? Staphylococcus lugdunensis NOT DETECTED NOT DETECTED Final  ? Streptococcus species NOT DETECTED NOT DETECTED Final  ? Streptococcus agalactiae NOT DETECTED NOT DETECTED Final  ? Streptococcus pneumoniae NOT DETECTED NOT DETECTED Final  ? Streptococcus pyogenes NOT DETECTED NOT DETECTED Final  ? A.calcoaceticus-baumannii NOT DETECTED NOT DETECTED Final  ? Bacteroides fragilis NOT DETECTED NOT DETECTED Final  ? Enterobacterales DETECTED (A) NOT DETECTED Final  ?  Comment: Enterobacterales represent a large order of gram negative bacteria, not a single organism. ?CRITICAL RESULT CALLED TO, READ BACK BY AND VERIFIED WITH: ?NATHAN BELUE '@320'$  06/23/2021 TTG ?  ?  Enterobacter cloacae complex NOT DETECTED NOT DETECTED Final  ? Escherichia coli DETECTED (A) NOT DETECTED Final  ?  Comment: CRITICAL RESULT CALLED TO, READ BACK BY AND VERIFIED WITH: ?NATHAN BELUE '@320'$  06/23/2021 TTG ?  ? Klebsiella aerogenes NOT DETECTED NOT DETECTED Final  ? Klebsiella oxytoca NOT DETECTED NOT DETECTED Final  ? Klebsiella pneumoniae NOT DETECTED NOT DETECTED Final  ? Proteus species NOT DETECTED NOT DETECTED Final  ? Salmonella species NOT DETECTED NOT DETECTED Final  ? Serratia marcescens NOT DETECTED NOT DETECTED Final  ? Haemophilus influenzae NOT DETECTED NOT DETECTED Final  ? Neisseria meningitidis NOT DETECTED NOT DETECTED Final  ? Pseudomonas aeruginosa NOT DETECTED NOT DETECTED Final  ? Stenotrophomonas maltophilia NOT DETECTED NOT DETECTED Final  ? Candida albicans NOT DETECTED NOT DETECTED  Final  ? Candida auris NOT DETECTED NOT DETECTED Final  ? Candida glabrata NOT DETECTED NOT DETECTED Final  ? Candida krusei NOT DETECTED NOT DETECTED Final  ? Candida parapsilosis NOT DETECTED NOT DETECTED Final  ? Candida tropicalis NOT DETECTED NOT DETECTED Final  ? Cryptococcus neoformans/gattii NOT DETECTED NOT DETECTED Final  ? CTX-M ESBL NOT DETECTED NOT DETECTED Final  ? Carbapenem resistance IMP NOT DETECTED NOT DETECTED Final  ? Carbapenem resistance KPC NOT DETECTED NOT DETECTED Final  ? Carbapenem resistance NDM NOT DETECTED NOT DETECTED Final  ? Carbapenem resist OXA 48 LIKE NOT DETECTED NOT DETECTED Final  ? Carbapenem resistance VIM NOT DETECTED NOT DETECTED Final  ?  Comment: Performed at [facility], [address], [place], [place] [address]  ?Resp Panel by RT-PCR (Flu A&B, Covid) Nasopharyngeal Swab     Status: None  ? Collection Time: 06/22/21  1:22 PM  ? Specimen: Nasopharyngeal Swab; Nasopharyngeal(NP) swabs in vial transport medium  ?Result Value Ref Range Status  ? SARS Coronavirus 2 by RT PCR NEGATIVE NEGATIVE Final  ?  Comment: (NOTE) ?SARS-CoV-2 target nucleic  acids are NOT DETECTED. ? ?The SARS-CoV-2 RNA is generally detectable in upper respiratory ?specimens during the acute phase of infection. The lowest ?concentration of SARS-CoV-2 viral copies this assay can detect

## 2021-06-24 NOTE — Progress Notes (Signed)
Patient came to [facility] on 5/5 for urosepsis and E coli bacteremia and PNA. 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 Oneita Kras.  ? ?On Cefepime to cover both UTI and PNA ? ?Vital signs BP and O2 sat stable on 2 liters. ? ?Today patient requested transfer to [facility] for worsening of kidney function and asking to be seen her own nephro. Attending at [facility] also recommend PCCM involvement for a worsening of procalcitonin and suspicious vasculitis involvement of the pulm changes. ? ?Accepted to PCU. ?

## 2021-06-24 NOTE — Assessment & Plan Note (Signed)
-   see severe sepsis 

## 2021-06-24 NOTE — Assessment & Plan Note (Signed)
Patient met severe sepsis criteria with tachycardia, tachypnea and lactic acidosis above 5.  Chest imaging with concern of multifocal pneumonia. ?Patient received broad-spectrum antibiotics which narrowed down to ceftriaxone and Zithromax, and now switched to cefepime and Zithromax due to worsening procalcitonin and cough. ?Preliminary blood cultures with E. Coli.  Urine cultures with no growth. ?Lactic acidosis resolved. ?-Ceftriaxone switched with cefepime ?-Continue with Zithromax ?-Continue with supportive care ?-Follow-up final culture results ?

## 2021-06-24 NOTE — Assessment & Plan Note (Signed)
Patient with history of atrial fibrillation/atrial flutter with variable conduction with LBBB.  Mildly elevated troponin likely due to demand ischemia. ?Mild tachycardia. ?-Restart home beta-blocker ?-Continue with Eliquis ?

## 2021-06-24 NOTE — Assessment & Plan Note (Signed)
? 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  Nephrology consulted at [facility] with treatment during this hospitalization notable for prednisone 20 mg p.o. daily.  Per patient preference, she requested transfer to [facility] for renal consultation from [facility], as her outpatient nephrologist is Dr. Oneita Kras.  ? ?As noted above, presentation has not been associated with any overt acute volume overload, nor any evidence of uremia, while initial mild hyperkalemia has subsequently resolved, while noting improvement in initial metabolic acidosis, as further quantified and described above.  Subsequently, no apparent clinical evidence for urgent overnight nephrology consultation.  We will plan for nephrology consultation of [facility] in the morning. ? ? ?Plan: Continue prednisone 20 mg p.o. daily.  Monitor strict I's and O's and daily weights.  Tempt avoid nephrotoxic agents.  Repeat CMP in the morning.  Check serum magnesium and phosphorus levels.  Anticipate consultation of [facility] in the morning, as above. ? ? ?

## 2021-06-24 NOTE — Assessment & Plan Note (Signed)
? ?#)   Severe sepsis: Initially admitted with severe sepsis due to multifactorial infectious process, including urinary tract infection, associated E. coli bacteremia, as well as multifocal pneumonia, recurrent left.  Initial lactate found to be elevated greater than 5, which reportedly is subsequently improved with IV antibiotics as well as relative improvement in renal function from initial presenting creatinine of 4.47 to final creatinine value of 4.27.  No evidence of hemodynamic instability reported.  On day of admission, initiated on broad-spectrum IV antibiotics, which were subsequently narrowed to Rocephin and azithromycin.  However, Rocephin was subsequently discontinued in favor of initiation of cefepime due to interval increase in procalcitonin level.  At the time of transfer, she was on azithromycin as well as cefepime. ? ?Plan: Continue azithromycin and cefepime, as above.  Follow for final results of urine culture for correlation with aforementioned E. coli bacteremia.  Repeat CBC with differential in the morning.  Prn acetaminophen for fever.  Flutter valve.  Incentive spirometry. ? ? ?

## 2021-06-24 NOTE — Progress Notes (Signed)
CCM notified this RN of cardiac rhythm change from A.fib to A flutter. Oncall NP notified at this time. No new orders. Pt is resting comfortably. Vitals stable.  ?

## 2021-06-24 NOTE — Assessment & Plan Note (Signed)
? ?#)   Paroxysmal atrial fibrillation: Documented history of such. In setting of CHA2DS2-VASc score of 5, there is an indication for chronic anticoagulation for thromboembolic prophylaxis. Consistent with this, patient is chronically anticoagulated on Eliquis. Home AV nodal blocking regimen: Metoprolol tartrate.  Most recent echocardiogram occurred in November 2022, as further detailed above.  Reportedly in sinus rhythm throughout hospital course at [facility]. ? ?Plan: monitor strict I's & O's and daily weights. Repeat BMP/CBC in AM. Check serum mag level.  Continue metoprolol tartrate as well as Eliquis. ? ? ?

## 2021-06-24 NOTE — H&P (Signed)
?History and Physical  ? ? ?PLEASE NOTE THAT DRAGON DI[employer] SOFTWARE WAS USED IN THE CONSTRUCTION OF THIS NOTE. ? ? ?Mackenzie [name] EQA:[id number] DOB: 12-Aug-1950 DOA: 06/24/2021 ? ?PCP: [name], MD  ?Patient coming from: home  ? ?I have personally briefly reviewed patient's old medical records in [place] ? ?Chief Complaint/reason for transfer: Acute kidney injury superimposed on stage IV CKD (patient preference) ? ?HPI: Mackenzie [name] is a 71 y.o. 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  ? ?The patient was admitted to [facility] on 06/22/2021 with severe sepsis due to suspected multifactorial infection including urinary tract infection, associated E. coli bacteremia, as well as multifocal pneumonia, right greater than left.  She was reportedly initially started on broad-spectrum IV antibiotics, that were subsequently narrowed to azithromycin and Rocephin, before Rocephin was discontinued in favor of cefepime due to interval progression in elevation of her procalcitonin level. ? ?Patient's pneumonia was reportedly associated with acute hypoxic respiratory distress, with initial oxygen saturations in the 80s before improving into the mid 90s on 2 L nasal cannula.  This is relative to no reported baseline supplemental oxygen requirements.  ? ?Her medical history is also notable for stage IV CKD secondary to nephritic syndrome in the setting of p-ANCA vasculitis, with a  diagnosis of such confirmed via renal biopsy in January 2023.  Per chart review, it appears that the patient's serum creatinine throughout the second half of January 2023 was in the range of 4.78-5.88.  She follows with Dr. Oneita K[facility] [place] [facility] and was reportedly started on high-dose steroids at that time, with ensuing mild improvement in creatinine to 3.47 per most recent prior serum creatinine data point on 05/07/2021.  Per review of [facility] discharge summary, outpatient nephrology had recently decreased the patient's high-dose steroid leading up to this most recent hospitalization at [facility].  Her creatinine upon admission to [facility] was noted to be 4.47, before decreasing slightly to 4.19 on 06/23/2021, with final serum creatinine data point noted to be 4.27 on the morning of 06/24/2021.  ? ?Per discharge summary, nephrology at [facility] was consulted and the patient received a dose of rituximab and was treated with prednisone 20 mg p.o. daily, with ensuing plan to continue the aforementioned steroid.   ? ?Hospitalization at [facility] was also associated with initial hyperkalemia, with presenting serum potassium level noted to be 5.7.  Discharge summary notes subsequent resolution of the patient's hyperkalemia following doses of Lokelma, Kayexalate, and initiation of daily Veltassa, with nephrology plan for continuation of daily Veltassa moving forward.  Final serum potassium data point at [facility] noted to be 4.7. ? ?Hospital course sodium see also notable for metabolic acidosis, felt to be multifactorial in the setting of initial sepsis, initial lactic acidosis as well as implications from AKI on CKD 4.  In the ED, the patient was reportedly treated with IV bicarbonate, before transition to oral bicarbonate, with reported interval improvement in degree of metabolic acidosis. ? ?In the setting of interval increase in serum creatinine relative to  value of 3.47 on 05/07/2021, the patient requested transfer to [facility] for renal  consultation via [place] [facility].  This request was accommodated, and the patient was transferred to [facility] on 06/24/2021 for further evaluation and management of the above, per patient request.  Per discharge summary, hospital course at [facility] was not associate with any evidence of acute volume overload, nor any evidence of uremia, while noting resolution of initial hyperkalemia and improving metabolic acidosis. ? ?Her medical history is also notable for chronic diastolic heart failure, with most recent echocardiogram performed in November 2022 notable for LVEF 60% along with grade 2 diastolic dysfunction.  As an outpatient, she is reportedly on Lasix and spironolactone.  However, in the setting of suspected euvolemia during hospitalization at [facility], both of these diuretic medications have been held.  ? ?She also has a history of hypertension, with outpatient hypertensive regimen notable for Lasix, prolactin, Norvasc, and Lopressor.  In the setting of euvolemia, normotensive blood pressures, and initial sepsis, her Lasix also elected, and amlodipine were held throughout the [facility] hospital course, while noting continuation of her metoprolol tartrate given her history of paroxysmal atrial fibrillation. ? ? ? ? ?Review of Systems: As per HPI otherwise 10 point review of systems negative.  ? ?Past Medical History:  ?Diagnosis Date  ? Arthritis   ? Atrial fibrillation ([place])   ? CHF (congestive heart failure) ([place])   ? HLD (hyperlipidemia)   ? Hypertension   ? ? ?Past Surgical History:  ?Procedure Laterality Date  ? COLONOSCOPY WITH PROPOFOL N/A 01/19/2021  ? hemorrhoids on perianal exam, examined portion ofileum normal, entire examined colon, normal. non bleeding external hemorrhoids, no specimens  ? ESOPHAGOGASTRODUODENOSCOPY (EGD) WITH PROPOFOL N/A 01/17/2021  ? normal hypopharynx and esophagus, z line regular 37cm from incisors, 2cm HH, congestive gastropathy, hematobulbar and post bulbar mucosa with  hemosiderosis but no evidence of PUD. no specimens  ? GIVENS CAPSULE STUDY N/A 02/02/2021  ? Procedure: GIVENS CAPSULE STUDY;  Surgeon: Harvel Quale, MD;  Location: [facility];  Service: Gastroenterology;  Laterality: N/A;  7:30  ? LEFT HEART CATH AND CORONARY ANGIOGRAPHY Right 05/06/2016  ? Procedure: Left Heart Cath and Coronary Angiography;  Surgeon: [name], MD;  Location: [place] CV LAB;  Service: Cardiovascular;  Laterality: Right;  ? ? ?Social History: ? reports that she has never smoked. She has never used smokeless tobacco. She reports that she does not drink alcohol and does not use drugs. ? ? ?No Known Allergies ? ?Family History  ?Problem Relation Age of Onset  ? Diabetes Mother   ? Heart disease Mother   ? Heart attack Father   ? Diabetes Brother   ? Breast cancer Maternal Grandmother   ? Breast cancer Maternal Aunt   ? Colon cancer Neg Hx   ? Colon polyps Neg Hx   ? ? ? ?Prior to Admission medications   ?Medication Sig Start Date End Date Taking? Authorizing Provider  ?acetaminophen (TYLENOL) 325 MG tablet Take 2 tablets (650 mg total) by mouth every 6 (six) hours as needed for mild pain (or Fever >/= 101). 06/24/21   Lorella Nimrod, MD  ?albuterol (PROVENTIL HFA;VENTOLIN HFA) 108 (90 Base) MCG/ACT inhaler Inhale 2 puffs into the lungs daily as needed for wheezing or shortness of breath. 06/11/16   Alisa Graff, FNP  ?amLODipine (NORVASC) 5 MG tablet Take 1 tablet (5 mg total) by mouth daily. 03/17/21   Phillips Grout, MD  ?apixaban [place])  5 MG TABS tablet Take 1 tablet (5 mg total) by mouth 2 (two) times daily. 06/11/16   Alisa Graff, FNP  ?atorvastatin (LIPITOR) 40 MG tablet Take 1 tablet (40 mg total) by mouth daily at 6 PM. 06/11/16   Alisa Graff, FNP  ?azithromycin (ZITHROMAX) 500 MG tablet Take 1 tablet (500 mg total) by mouth daily for 3 days. Take 1 tablet daily for 3 days. 06/24/21 06/27/21  Lorella Nimrod, MD  ?benzonatate (TESSALON) 100 MG capsule Take 1  capsule (100 mg total) by mouth 3 (three) times daily as needed for cough. 06/24/21   Lorella Nimrod, MD  ?blood glucose meter kit and supplies KIT Dispense based on patient and insurance preference. Use up to four times

## 2021-06-24 NOTE — Discharge Summary (Signed)
?Physician Discharge Summary ?  ?Patient: Mackenzie [name] MRN: [id number] DOB: Feb 18, 1951  ?Admit date:     06/22/2021  ?Discharge date: 06/24/21  ?Discharge Physician: Lorella Nimrod  ? ?PCP: [name], MD  ? ?Recommendations at discharge:  ?Patient is being transferred to [facility] ? ?Discharge Diagnoses: ?Principal Problem: ?  Sepsis ([place]) ?Active Problems: ?  Metabolic acidosis ?  Pneumonia of right lower lobe due to infectious organism ?  Acute on chronic renal failure (HCC) ?  Hyperkalemia ?  Atrial fibrillation ([place]) ?  Chronic diastolic CHF (congestive heart failure) ([place]) ?  HTN (hypertension) ?  Normocytic anemia ? ?Resolved Problems: ?  * No resolved hospital problems. * ? ?Hospital Course: ?Taken from H&P. ? ?GALADRIEL [name] is an 71 y.o. female with HTN, CAD, DM 2, HFpEF, CKD IV and atrial fibrillation was in [facility] until 1 to 2 weeks ago when she started having progressive fatigue and malaise.  She was seen in PCP office and was treated for UTI.  However the medication caused nausea and vomiting and diarrhea so she only took 1 dose and then stop taking it. ?  ?Her daughter noticed that over the past 2 days patient has been sleeping and in the morning which is unusual for her.  Usually she is up and about helping with her grandkids but has stayed in bed the last couple of days.  Last night and into early this morning patient had progressive increase in shortness of breath and progressive increase in cough and was brought to the ED.  Patient denies fevers but is not sure.  No further nausea vomiting or diarrhea since she stopped taking the OTC antibiotic for UTI. ? ?On arrival to ED she was markedly hypertensive and tachypneic.  EMS initially called as STEMI however patient was seen by cardiology and diagnosed as not having any STEMI just LBBB. ?Further work-up with concern of multifocal pneumonia, bland urine sediment. ?Initial troponin elevated at 170 with a downward trend to 145.  No chest pain. ?Also  found to have creatinine at 4.47 which is close to her baseline.  Potassium of 5.7 which remained at that level despite getting 1 dose of Lokelma. ?She was also had known anion gap metabolic acidosis, most likely secondary to lactic acidosis and AKI, received IV bicarb in the ED. ?1 set of blood cultures with E. Coli. ?Urine cultures negative. ?MRSA swab negative. ? ?CT chest, abdomen and pelvis with large bilateral airspace opacities, right greater than left, consistent with multifocal pneumonia.  No acute abnormality noted in the abdomen or pelvis. ? ?Patient was started on cefepime, vancomycin and Zithromax.  And later switched to ceftriaxone and Zithromax. ?Patient also received aggressive IV fluid per sepsis protocol. ? ?5/6: Patient was still feeling short of breath, saturating well on 4 L of oxygen, no baseline oxygen use.  On exam she was having bilateral basal crackles and trace lower extremity edema. ?Procalcitonin, BNP ordered.  IV fluid discontinued. ?Giving one-time dose of IV Lasix. ?Starting her on p.o. bicarb supplement. ? ?5/7: BNP elevated at 224.  Oxygen requirement improved after giving 1 dose of Lasix yesterday. ?Continues to have significant cough, no sputum production or hemoptysis. ?Continues to have significant shortness of breath. ?Worsening procalcitonin, at 80 today.  Lactic acidosis has been resolved.  Hyperkalemia resolved. ?Slight worsening of creatinine, at 4.27 with BUN of 100 today. 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  Patient was placed on rituximab and prednisone by his nephrologist.  Per family member prednisone dose was recently decreased to 20 mg daily. ?Due to worsening cough and respiratory symptoms and her immunocompromise state, antibiotics were broadened to cefepime for pseudomonal coverage. ?No sputum production for culture. ?Also concern of vasculitis associated lung  involvement. ? ?Patient and family was requesting transfer to [facility] as they wanted to see there own nephrologist and was very concerned about worsening renal function. ?We consulted with our nephrologist and after discussion with patient and family, decided to transfer to [facility] as patient might need higher level of care and involvement of pulmonology along with nephrology. ?We kept her on 20 mg of prednisone for concern of worsening superadded bacterial infection. ? ?Patient is being transferred to [facility] for continuity and further management. ? ?  ? ? ? ?Assessment and Plan: ?* Sepsis ([place]) ?Patient met severe sepsis criteria with tachycardia, tachypnea and lactic acidosis above 5.  Chest imaging with concern of multifocal pneumonia. ?Patient received broad-spectrum antibiotics which narrowed down to ceftriaxone and Zithromax, and now switched to cefepime and Zithromax due to worsening procalcitonin and cough. ?Preliminary blood cultures with E. Coli.  Urine cultures with no growth. ?Lactic acidosis resolved. ?-Ceftriaxone switched with cefepime ?-Continue with Zithromax ?-Continue with supportive care ?-Follow-up final culture results ? ?Pneumonia of right lower lobe due to infectious organism ?Chest imaging concerning for bilateral pneumonia, right more than left. ?Worsening procalcitonin, at 80 today. ?-Switch ceftriaxone with cefepime. ?-Continue with Zithromax ? ?Metabolic acidosis ?Patient had known anion gap metabolic acidosis most likely secondary to sepsis with lactic acidosis and CKD.  Received IV bicarb in ED. ?A.m. bicarb at 15, she was started on p.o. bicarb supplement ?-Continue p.o. bicarb supplement ?-Continue to monitor ? ?Acute on chronic renal failure (HCC) ?Most likely secondary to sepsis.  History of P ANCA associated pauci-immune glomerulonephritis, diagnosed on renal biopsy earlier this year in January 2023.  Patient received rituximab and also placed on high-dose steroid, dose  recently decreased to 20 mg daily.  Slight worsening of creatinine at 4.27 with BUN of 100 today.  Nephrology was also consulted but patient would like to continue with her own nephrologist, she follow-up with Dr. Sherrilee Gilles with [place] [facility]. ?She is being transferred to [facility] for further management. ?-Continue to monitor ?-Avoid nephrotoxins ? ?Hyperkalemia ?Potassium at 5.7 on presentation, most likely is secondary to AKI with CKD stage IV. Received 1 dose of Lokelma-potassium remains at 5.7. ?Potassium improved to 4.7 after getting Kayexalate with Veltassa. ?-Daily Veltassa ?-Continue to monitor ? ? ?Atrial fibrillation ([place]) ?Patient with history of atrial fibrillation/atrial flutter with variable conduction with LBBB.  Mildly elevated troponin likely due to demand ischemia. ?Mild tachycardia. ?-Restart home beta-blocker ?-Continue with Eliquis ? ?Chronic diastolic CHF (congestive heart failure) ([place]) ?Echocardiogram November 2022 shows EF of 60% with asymmetrical LV thickening consistent with diastolic dysfunction grade 2. ?Patient was on p.o. Lasix and spironolactone at home. ?-Given one-time dose of IV Lasix due to basal crackles ?-Holding spironolactone due to hyperkalemia ?-Continue to monitor ? ?HTN (hypertension) ?Blood pressure within goal today. ?Patient was on amlodipine, Lasix and spironolactone at home ?Initial home antihypertensives were held due to concern of sepsis. ?She was started on low-dose metoprolol. ?-Continue metoprolol. ?-Keep holding home amlodipine-we will restart if needed. ?-Keep holding home spironolactone for concern of hyperkalemia ? ?Normocytic anemia ?Hemoglobin at 8.6 this morning, close to her baseline.  It was 11 on admission,  most likely concentrated. ?-Continue to monitor ?-Transfuse if below 7 ? ? ?Consultants: Nephrology ?Procedures performed: None ?Disposition:  [place] ?Diet recommendation:  ?Discharge Diet Orders (From admission, onward)  ? ?  Start      Ordered  ? 06/24/21 0000  Diet - low sodium heart healthy       ? 06/24/21 1500  ? ?  ?  ? ?  ? ?Cardiac and Carb modified diet ?DISCHARGE MEDICATION: ?Allergies as of 06/24/2021   ?No Known Allergies ?  ? ?  ?Medic

## 2021-06-24 NOTE — Assessment & Plan Note (Signed)
Chest imaging concerning for bilateral pneumonia, right more than left. ?Worsening procalcitonin, at 80 today. ?-Switch ceftriaxone with cefepime. ?-Continue with Zithromax ?

## 2021-06-24 NOTE — Assessment & Plan Note (Signed)
? ?#(  Acute hypoxic respiratory distress: In the context of no known baseline supplemental oxygen requirements, initial O2 sats noted to be in the high 80s, subsequent proving in the mid 90s on 2 L nasal cannula.  Felt to be on the basis of patient's multifocal pneumonia, as further detailed above.  Per review of discharge summary, no reported concern for acute decompensated heart failure, as the patient reportedly appeared euvolemic throughout hospital course.  ? ? ?Plan: Further evaluation management of severe sepsis due to multifocal pneumonia, as above.  Including IV antibiotics as above.  Flutter valve.  Incentive spirometry.  Repeat CBC differential in the morning.  Check serum magnesium and phosphorus levels.  Prn albuterol inhaler. ? ? ?

## 2021-06-24 NOTE — Assessment & Plan Note (Signed)
? ?#)   Chronic diastolic heart failure: Documented history of such, with most recent echocardiogram in November 2022 notable for LVEF 60 to 65% as well as grade 2 diastolic dysfunction.  Diuretic regimen includes Lasix as well as spironolactone.  Per discharge summary, hospital course at [facility] was felt to be associate with euvolemia.  Consequently, spironolactone and Lasix held throughout the hospital course, in addition to spironolactone being held in the setting of initial hyperkalemia.  ? ?Plan: Monitor strict I's and O's and daily weights.  Continue to hold spironolactone and Lasix, as above. ? ?

## 2021-06-24 NOTE — Assessment & Plan Note (Signed)
? ?#)   hypertension, likely with an element of secondary contributions in the setting of CKD 4, with outpatient hypertensive regimen including Lasix, spironolactone, Norvasc, metoprolol.  In the setting of clinical suspicion for euvolemia as well as initial hyperkalemia, and with initial severe sepsis, Lasix, spironolactone, Norvasc were held throughout hospital course at [facility], while metoprolol tartrate was continued in the setting of the patient's history of paroxysmal atrial fibrillation.  Normotensive blood pressures reported throughout hospital course at [facility]. ? ?Plan: Continue metoprolol tartrate, as above, while holding home Lasix, spironolactone, and Norvasc, as above.  Monitor strict I's and O's and daily weights.  Close monitoring of ensuing blood pressure via routine vital signs. ? ? ?

## 2021-06-24 NOTE — Assessment & Plan Note (Signed)
? ? ?#)   Anemia of chronic kidney disease: Documented history of such, with reported baseline hemoglobin of approximately 9.  Final hemoglobin at [facility] consistent with baseline, with value of 8.6 on the morning of 06/24/2021.  No evidence of active bleed. ? ?Plan: Repeat CBC in the morning.  Further evaluation management of AKI on CKD 4, as above. ? ? ?

## 2021-06-24 NOTE — Assessment & Plan Note (Signed)
Potassium at 5.7 on presentation, most likely is secondary to AKI with CKD stage IV. Received 1 dose of Lokelma-potassium remains at 5.7. ?Potassium improved to 4.7 after getting Kayexalate with Veltassa. ?-Daily Veltassa ?-Continue to monitor ? ?

## 2021-06-24 NOTE — Assessment & Plan Note (Signed)
? ?#)   Metabolic acidosis: Per discharge summary, now improved, but initially suspected to be on the basis of severe sepsis as well as acute kidney injury superimposed on stage IV CKD with associated lactic acidosis.  Initially treated with IV bicarbonate in the ED, followed by transition to oral bicarbonate.  ? ?Plan: Repeat CMP in the morning.  Further evaluation management of AKI on CKD 4, as above.  Continue aforementioned oral bicarbonate. ? ? ? ?

## 2021-06-24 NOTE — Assessment & Plan Note (Signed)
? ? ?#)   Type 2 Diabetes Mellitus: documented history of such.  Was treated with sliding scale NovoLog throughout course at [facility]. ? ?Plan: accuchecks QAC and HS with low dose SSI.  ? ? ?

## 2021-06-24 NOTE — Assessment & Plan Note (Signed)
Hemoglobin at 8.6 this morning, close to her baseline.  It was 11 on admission, most likely concentrated. ?-Continue to monitor ?-Transfuse if below 7 ?

## 2021-06-24 NOTE — Assessment & Plan Note (Signed)
Echocardiogram November 2022 shows EF of 60% with asymmetrical LV thickening consistent with diastolic dysfunction grade 2. ?Patient was on p.o. Lasix and spironolactone at home. ?-Given one-time dose of IV Lasix due to basal crackles ?-Holding spironolactone due to hyperkalemia ?-Continue to monitor ?

## 2021-06-24 NOTE — Progress Notes (Signed)
Pharmacy Antibiotic Note ? ?Mackenzie [name] is a 71 y.o. female admitted on 06/22/2021 with  HCAP .  PMH includes HTN, CAD, T2DM, HFpEF, CKD4, and atrial fibrillation. Pharmacy has been consulted for cefepime dosing. ? ?Started on cefepime and vancomycin, then transitioned to ceftriaxone due to E. Coli on BCID. CT chest, abdomen and pelvis with large bilateral airspace opacities, right greater than left, consistent with multifocal pneumonia. On [place] 4L. ? ?Plan: Cefepime 2 grams every 24 hours ? ?Also on azithromycin 500 mg.  ? ?Monitor renal function, clinical course ? ?Height: '5\' 6"'$  (167.6 cm) ?Weight: 72.6 kg (160 lb 0.9 oz) ?IBW/kg (Calculated) : 59.3 ? ?Temp (24hrs), Avg:98.3 ?F (36.8 ?C), Min:98.1 ?F (36.7 ?C), Max:98.6 ?F (37 ?C) ? ?Recent Labs  ?Lab 06/22/21 ?1240 06/22/21 ?1440 06/23/21 ?1610 06/23/21 ?1729 06/24/21 ?0420  ?WBC 5.4  --  7.7  --   --   ?CREATININE 4.47*  --  4.19*  --  4.27*  ?LATICACIDVEN 4.4* 5.7*  --  1.2  --   ?VANCORANDOM  --   --   --  22  --   ?  ?Estimated Creatinine Clearance: 12.5 mL/min (A) (by C-G formula based on SCr of 4.27 mg/dL (H)).   ? ?No Known Allergies ? ?Antimicrobials this admission: ?5/5 cefepime >> 5/5 ?5/5 vancomycin >> 5/5 ?5/5 azithromycin >>  ?5/5 ceftriaxone >> ? ?Dose adjustments this admission: ?N/a ? ?Microbiology results: ?5/5 BCx: E. Coli, no resistance ?5/5 MRSA PCR: Negative ? ?Thank you for allowing pharmacy to be a part of this patient?s care. ? ?Wynelle Cleveland, PharmD ?Pharmacy Resident  ?06/24/2021 ?11:02 AM ? ?

## 2021-06-24 NOTE — Assessment & Plan Note (Signed)
Patient had known anion gap metabolic acidosis most likely secondary to sepsis with lactic acidosis and CKD.  Received IV bicarb in ED. ?A.m. bicarb at 15, she was started on p.o. bicarb supplement ?-Continue p.o. bicarb supplement ?-Continue to monitor ?

## 2021-06-24 NOTE — Progress Notes (Signed)
Receiving RN Tad Moore updated on ETA. ?

## 2021-06-24 NOTE — Progress Notes (Signed)
Received patient from [facility] 2 A to [place] x 4. Denied any acute pain. The patient is oriented to her room, ascom, call bell and staff. Patient voiced no concern at this time. Awaiting for  orders from the MD. Will continue to monitor. ?

## 2021-06-24 NOTE — Assessment & Plan Note (Signed)
Most likely secondary to sepsis.  History of P ANCA associated pauci-immune glomerulonephritis, diagnosed on renal biopsy earlier this year in January 2023.  Patient received rituximab and also placed on high-dose steroid, dose recently decreased to 20 mg daily.  Slight worsening of creatinine at 4.27 with BUN of 100 today.  Nephrology was also consulted but patient would like to continue with her own nephrologist, she follow-up with Dr. Sherrilee Gilles with [facility]. ?She is being transferred to [facility] for further management. ?-Continue to monitor ?-Avoid nephrotoxins ?

## 2021-06-24 NOTE — TOC Progression Note (Signed)
Transition of Care (TOC) - Progression Note  ? ? ?Patient Details  ?Name: Mackenzie [name] ?MRN: [id number] ?Date of Birth: [birth year] ? ?Transition of Care (TOC) CM/SW Contact  ?Izola Price, RN ?Phone Number: ?06/24/2021, 1:20 PM ? ?Clinical Narrative:  ?Patient is being transferred today to [facility] per provider. Simmie Davies RN CM     ? ? ? ?  ?  ? ?Expected Discharge Plan and Services ?  ?  ?  ?  ?  ?                ?  ?  ?  ?  ?  ?  ?  ?  ?  ?  ? ? ?Social Determinants of Health (SDOH) Interventions ?  ? ?Readmission Risk Interventions ?   ? View : No data to display.  ?  ?  ?  ? ? ?

## 2021-06-24 NOTE — Progress Notes (Signed)
Released to care of [facility] RN for transfer to [facility] Room 13.  Belongings previously packed up and with daughter April.  No distress noted at this time. Atrial fib continues on telemetry with controlled rates.  Droplet precautions maintained. ?

## 2021-06-24 NOTE — Assessment & Plan Note (Signed)
Blood pressure within goal today. ?Patient was on amlodipine, Lasix and spironolactone at home ?Initial home antihypertensives were held due to concern of sepsis. ?She was started on low-dose metoprolol. ?-Continue metoprolol. ?-Keep holding home amlodipine-we will restart if needed. ?-Keep holding home spironolactone for concern of hyperkalemia ?

## 2021-06-24 NOTE — Assessment & Plan Note (Signed)
? ?#)   Hyperkalemia: Now resolved; initial serum potassium level noted to be 5.7, which is subsequently improved following dose of Lokelma, Kayexalate, as well as daily Veltassa, with discharge summary conveying final serum potassium data point of 4.7.  Initial hyperkalemia felt to be on the basis of AKI on CKD 4 as well as contribution from aforementioned metabolic acidosis plan at time of transfer was to continue daily Veltassa. ? ?Plan: Continue daily Veltassa.  Repeat CMP in the morning.  Check magnesium level in the morning. ? ? ?

## 2021-06-25 ENCOUNTER — Other Ambulatory Visit: Payer: Self-pay

## 2021-06-25 ENCOUNTER — Inpatient Hospital Stay (HOSPITAL_COMMUNITY): Payer: Medicare Other

## 2021-06-25 ENCOUNTER — Encounter (HOSPITAL_COMMUNITY): Payer: Self-pay | Admitting: Internal Medicine

## 2021-06-25 LAB — CBC WITH DIFFERENTIAL/PLATELET
Abs Immature Granulocytes: 1.25 10*3/uL — ABNORMAL HIGH (ref 0.00–0.07)
Basophils Absolute: 0.1 10*3/uL (ref 0.0–0.1)
Basophils Relative: 1 %
Eosinophils Absolute: 0 10*3/uL (ref 0.0–0.5)
Eosinophils Relative: 0 %
HCT: 24.5 % — ABNORMAL LOW (ref 36.0–46.0)
Hemoglobin: 7.9 g/dL — ABNORMAL LOW (ref 12.0–15.0)
Immature Granulocytes: 12 %
Lymphocytes Relative: 2 %
Lymphs Abs: 0.2 10*3/uL — ABNORMAL LOW (ref 0.7–4.0)
MCH: 30.7 pg (ref 26.0–34.0)
MCHC: 32.2 g/dL (ref 30.0–36.0)
MCV: 95.3 fL (ref 80.0–100.0)
Monocytes Absolute: 0.2 10*3/uL (ref 0.1–1.0)
Monocytes Relative: 2 %
Neutro Abs: 8.5 10*3/uL — ABNORMAL HIGH (ref 1.7–7.7)
Neutrophils Relative %: 83 %
Platelets: 104 10*3/uL — ABNORMAL LOW (ref 150–400)
RBC: 2.57 MIL/uL — ABNORMAL LOW (ref 3.87–5.11)
RDW: 15 % (ref 11.5–15.5)
WBC: 10.1 10*3/uL (ref 4.0–10.5)
nRBC: 0.2 % (ref 0.0–0.2)

## 2021-06-25 LAB — COMPREHENSIVE METABOLIC PANEL
ALT: 16 U/L (ref 0–44)
AST: 22 U/L (ref 15–41)
Albumin: 2 g/dL — ABNORMAL LOW (ref 3.5–5.0)
Alkaline Phosphatase: 55 U/L (ref 38–126)
Anion gap: 13 (ref 5–15)
BUN: 101 mg/dL — ABNORMAL HIGH (ref 8–23)
CO2: 13 mmol/L — ABNORMAL LOW (ref 22–32)
Calcium: 7.4 mg/dL — ABNORMAL LOW (ref 8.9–10.3)
Chloride: 114 mmol/L — ABNORMAL HIGH (ref 98–111)
Creatinine, Ser: 4.82 mg/dL — ABNORMAL HIGH (ref 0.44–1.00)
GFR, Estimated: 9 mL/min — ABNORMAL LOW (ref >60)
Glucose, Bld: 146 mg/dL — ABNORMAL HIGH (ref 70–99)
Potassium: 5 mmol/L (ref 3.5–5.1)
Sodium: 140 mmol/L (ref 135–145)
Total Bilirubin: 1 mg/dL (ref 0.3–1.2)
Total Protein: 5.2 g/dL — ABNORMAL LOW (ref 6.5–8.1)

## 2021-06-25 LAB — HEPATITIS B SURFACE ANTIGEN: Hepatitis B Surface Ag: NONREACTIVE

## 2021-06-25 LAB — CULTURE, BLOOD (ROUTINE X 2)

## 2021-06-25 LAB — GLUCOSE, CAPILLARY
Glucose-Capillary: 110 mg/dL — ABNORMAL HIGH (ref 70–99)
Glucose-Capillary: 135 mg/dL — ABNORMAL HIGH (ref 70–99)
Glucose-Capillary: 174 mg/dL — ABNORMAL HIGH (ref 70–99)
Glucose-Capillary: 169 mg/dL — ABNORMAL HIGH (ref 70–99)

## 2021-06-25 LAB — BRAIN NATRIURETIC PEPTIDE: B Natriuretic Peptide: 322 pg/mL — ABNORMAL HIGH (ref 0.0–100.0)

## 2021-06-25 LAB — PATHOLOGIST SMEAR REVIEW

## 2021-06-25 LAB — IRON AND TIBC
Iron: 26 ug/dL — ABNORMAL LOW (ref 28–170)
Saturation Ratios: 18 % (ref 10.4–31.8)
TIBC: 141 ug/dL — ABNORMAL LOW (ref 250–450)
UIBC: 115 ug/dL

## 2021-06-25 LAB — PHOSPHORUS: Phosphorus: 6.9 mg/dL — ABNORMAL HIGH (ref 2.5–4.6)

## 2021-06-25 LAB — PROCALCITONIN: Procalcitonin: 64.87 ng/mL

## 2021-06-25 LAB — MAGNESIUM: Magnesium: 1.3 mg/dL — ABNORMAL LOW (ref 1.7–2.4)

## 2021-06-25 LAB — C-REACTIVE PROTEIN: CRP: 22.8 mg/dL — ABNORMAL HIGH (ref <1.0)

## 2021-06-25 LAB — FERRITIN: Ferritin: 1924 ng/mL — ABNORMAL HIGH (ref 11–307)

## 2021-06-25 LAB — HEPATITIS B SURFACE ANTIBODY,QUALITATIVE: Hep B S Ab: NONREACTIVE

## 2021-06-25 MED ORDER — MAGNESIUM SULFATE 2 GM/50ML IV SOLN
2.0000 g | Freq: Once | INTRAVENOUS | Status: AC
  Administered 2021-06-25: 2 g via INTRAVENOUS
  Filled 2021-06-25: qty 50

## 2021-06-25 MED ORDER — LIDOCAINE HCL (PF) 1 % IJ SOLN
5.0000 mL | INTRAMUSCULAR | Status: DC | PRN
  Filled 2021-06-25: qty 5

## 2021-06-25 MED ORDER — HEPARIN SODIUM (PORCINE) 1000 UNIT/ML DIALYSIS
1000.0000 [IU] | INTRAMUSCULAR | Status: DC | PRN
  Filled 2021-06-25: qty 1

## 2021-06-25 MED ORDER — ALTEPLASE 2 MG IJ SOLR: 2.0000 mg | Freq: Once | INTRAMUSCULAR | Status: DC | PRN

## 2021-06-25 MED ORDER — HYDRALAZINE HCL 20 MG/ML IJ SOLN: 10.0000 mg | Freq: Four times a day (QID) | INTRAMUSCULAR | Status: DC | PRN

## 2021-06-25 MED ORDER — PANTOPRAZOLE SODIUM 40 MG PO TBEC
40.0000 mg | DELAYED_RELEASE_TABLET | Freq: Every day | ORAL | Status: DC
  Administered 2021-06-25 – 2021-07-02 (×7): 40 mg via ORAL
  Filled 2021-06-25 (×7): qty 1

## 2021-06-25 MED ORDER — PREDNISONE 20 MG PO TABS
40.0000 mg | ORAL_TABLET | Freq: Once | ORAL | Status: AC
  Administered 2021-06-25: 40 mg via ORAL
  Filled 2021-06-25: qty 2

## 2021-06-25 MED ORDER — PENTAFLUOROPROP-TETRAFLUOROETH EX AERO: 1.0000 "application " | INHALATION_SPRAY | CUTANEOUS | Status: DC | PRN

## 2021-06-25 MED ORDER — PREDNISONE 20 MG PO TABS
60.0000 mg | ORAL_TABLET | Freq: Every day | ORAL | Status: DC
  Administered 2021-06-27 – 2021-06-29 (×3): 60 mg via ORAL
  Filled 2021-06-25 (×3): qty 3

## 2021-06-25 MED ORDER — CHLORHEXIDINE GLUCONATE CLOTH 2 % EX PADS
6.0000 | MEDICATED_PAD | Freq: Every day | CUTANEOUS | Status: DC
  Administered 2021-06-26 – 2021-06-27 (×2): 6 via TOPICAL

## 2021-06-25 MED ORDER — LIDOCAINE-PRILOCAINE 2.5-2.5 % EX CREA
1.0000 "application " | TOPICAL_CREAM | CUTANEOUS | Status: DC | PRN
  Filled 2021-06-25: qty 5

## 2021-06-25 MED ORDER — SODIUM CHLORIDE 0.9 % IV SOLN: 100.0000 mL | INTRAVENOUS | Status: DC | PRN

## 2021-06-25 MED ORDER — FUROSEMIDE 10 MG/ML IJ SOLN
60.0000 mg | Freq: Once | INTRAMUSCULAR | Status: AC
  Administered 2021-06-25: 60 mg via INTRAVENOUS
  Filled 2021-06-25: qty 6

## 2021-06-25 NOTE — Evaluation (Signed)
Physical Therapy Evaluation ?Patient Details ?Name: Mackenzie [name] ?MRN: [id number] ?DOB: 1950/11/07 ?Today's Date: 06/25/2021 ? ?History of Present Illness ? 71 y.o. female who is admitted to [facility] 06/24/2021 by way of transfer from [facility] (adm 06/22/21 for sepsis due to UTI and multifocal pneumonia) for evaluation and management of acute kidney injury superimposed on CKD 4. 5/08 pt deciding if she wants to start HD  PMH significant for stage IV CKD, paroxysmal atrial fibrillation chronically anticoagulated on Eliquis, chronic diastolic heart failure, hypertension, anemia of chronic kidney disease, type 2 diabetes mellitus  ?Clinical Impression ?  ?Pt admitted secondary to problem above with deficits below. PTA patient was living with daughter with nearly 24/7 supervision/assist as needed. Patient ambulating without a device, however did need assist going up/down 2 steps to enter/exit home. Pt currently requires min-guard assist to ambulate with RW and oxygen (sats 87% on room air; 90% on 2L).  Anticipate patient will benefit from PT to address problems listed below.Will continue to follow acutely to maximize functional mobility independence and safety.   ?   ?   ? ?Recommendations for follow up therapy are one component of a multi-disciplinary discharge planning process, led by the attending physician.  Recommendations may be updated based on patient status, additional functional criteria and insurance authorization. ? ?Follow Up Recommendations No PT follow up ? ?  ?Assistance Recommended at Discharge Intermittent Supervision/Assistance  ?Patient can return home with the following ? A little help with walking and/or transfers;Assistance with cooking/housework;Direct supervision/assist for medications management;Direct supervision/assist for financial management;Help with stairs or ramp for entrance ? ?  ?Equipment Recommendations Rolling walker (2 wheels) (pt currently refusing RW; will continue to assess)   ?Recommendations for Other Services ?    ?  ?Functional Status Assessment Patient has had a recent decline in their functional status and demonstrates the ability to make significant improvements in function in a reasonable and predictable amount of time.  ? ?  ?Precautions / Restrictions Precautions ?Precautions: Fall  ? ?  ? ?Mobility ? Bed Mobility ?  ?  ?  ?  ?  ?  ?  ?General bed mobility comments: up in chair on arrival ?  ? ?Transfers ?Overall transfer level: Needs assistance ?Equipment used: Rolling walker (2 wheels) ?Transfers: Sit to/from Stand ?Sit to Stand: Min guard ?  ?  ?  ?  ?  ?General transfer comment: vc for proper hand placement/safety with RW ?  ? ?Ambulation/Gait ?Ambulation/Gait assistance: Min guard ?Gait Distance (Feet): 50 Feet ?Assistive device: Rolling walker (2 wheels) ?Gait Pattern/deviations: Step-through pattern, Decreased stride length, Trunk flexed ?  ?Gait velocity interpretation: 1.31 - 2.62 ft/sec, indicative of limited community ambulator ?  ?General Gait Details: pt with some hesitancy with gait; agreed to use RW to incr her confidence but later states she does not want to use a RW at home ? ?Stairs ?  ?  ?  ?  ?  ? ?Wheelchair Mobility ?  ? ?Modified Rankin (Stroke Patients Only) ?  ? ?  ? ?Balance Overall balance assessment: Needs assistance ?Sitting-balance support: No upper extremity supported ?Sitting balance-Leahy Scale: Good ?  ?  ?Standing balance support: Bilateral upper extremity supported ?Standing balance-Leahy Scale: Poor ?  ?  ?  ?  ?  ?  ?  ?  ?  ?  ?  ?  ?   ? ? ? ?Pertinent Vitals/Pain Pain Assessment ?Pain Assessment: No/denies pain  ? ? ?Home Living Family/patient expects to  be discharged to:: Private residence ?Living Arrangements: Children ?Available Help at Discharge: Family;Available 24 hours/day ?Type of Home: House ?Home Access: Stairs to enter ?Entrance Stairs-Rails: None ?Entrance Stairs-Number of Steps: 2 ?  ?Home Layout: One level ?Home Equipment:  None ?   ?  ?Prior Function Prior Level of Function : Needs assist ?  ?  ?  ?  ?  ?  ?Mobility Comments: reports walks with no device; does need assist up/down outside steps ?ADLs Comments: bathes at sink ?  ? ? ?Hand Dominance  ?   ? ?  ?Extremity/Trunk Assessment  ? Upper Extremity Assessment ?Upper Extremity Assessment: Generalized weakness ?  ? ?Lower Extremity Assessment ?Lower Extremity Assessment: Generalized weakness ?  ? ?Cervical / Trunk Assessment ?Cervical / Trunk Assessment: Normal  ?Communication  ? Communication: Other (comment) (somewhat difficult to understand due to lack of dentures)  ?Cognition Arousal/Alertness: Awake/alert ?Behavior During Therapy: [facility] for tasks assessed/performed ?Overall Cognitive Status: History of cognitive impairments - at baseline ?  ?  ?  ?  ?  ?  ?  ?  ?  ?  ?  ?  ?  ?  ?  ?  ?General Comments: family member correcting her anwers to home set up ?  ?  ? ?  ?General Comments General comments (skin integrity, edema, etc.): Family member present initially and left mid-session ? ?  ?Exercises    ? ?Assessment/Plan  ?  ?PT Assessment Patient needs continued PT services  ?PT Problem List Decreased strength;Decreased activity tolerance;Decreased balance;Decreased mobility;Decreased knowledge of use of DME;Decreased safety awareness;Cardiopulmonary status limiting activity ? ?   ?  ?PT Treatment Interventions DME instruction;Gait training;Stair training;Functional mobility training;Therapeutic activities;Therapeutic exercise;Balance training;Patient/family education   ? ?PT Goals (Current goals can be found in the Care Plan section)  ?Acute Rehab PT Goals ?Patient Stated Goal: return home without needing a RW ?PT Goal Formulation: With patient ?Time For Goal Achievement: 07/09/21 ?Potential to Achieve Goals: Good ? ?  ?Frequency Min 3X/week ?  ? ? ?Co-evaluation   ?  ?  ?  ?  ? ? ?  ?AM-PAC PT "6 Clicks" Mobility  ?Outcome Measure Help needed turning from your back to your side  while in a flat bed without using bedrails?: A Little ?Help needed moving from lying on your back to sitting on the side of a flat bed without using bedrails?: A Little ?Help needed moving to and from a bed to a chair (including a wheelchair)?: A Little ?Help needed standing up from a chair using your arms (e.g., wheelchair or bedside chair)?: A Little ?Help needed to walk in hospital room?: A Little ?Help needed climbing 3-5 steps with a railing? : A Little ?6 Click Score: 18 ? ?  ?End of Session Equipment Utilized During Treatment: Gait belt;Oxygen ?Activity Tolerance: Patient tolerated treatment well ?Patient left: in chair;with call bell/phone within reach ?Nurse Communication: Mobility status ?PT Visit Diagnosis: Difficulty in walking, not elsewhere classified (R26.2) ?  ? ?Time: 1937-9024 ?PT Time Calculation (min) (ACUTE ONLY): 21 min ? ? ?Charges:   PT Evaluation ?$PT Eval Low Complexity: 1 Low ?  ?  ?   ? ? ? ?Arby Barrette, PT ?Acute Rehabilitation Services  ?Pager [phone] ?Office [phone] ? ? ?Jeanie Cooks Teshia Mahone ?06/25/2021, 1:37 PM ? ?

## 2021-06-25 NOTE — Consult Note (Addendum)
[facility] Nephrology Consultation Note  Requesting MD: Susa Raring, MD Reason for consult: AKI on CKD IV  HPI:  Mackenzie [name] is a 71 y.o. 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 Patient initially presented to [facility] on 5/5 with fatigue, malaise, and cough where she was admitted for severe sepsis. Sepsis appeared to be multifactorial in etiology secondary to E. Coli bacteremia and multifocal pneumonia. Patient subsequently transferred to [facility] on 5/7 for ongoing management from a renal standpoint.  Patient follows with Dr. Arrie Aran outpatient. 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 Her renal function has varied greatly in recent months, with baseline SCr ~5 in January 2023. She was started on high-dose steroids (prednisone 60mg  daily) around that time, with subsequent improvement in renal function. Most recent SCr prior to admission was 3.47 in March 2023, around which time her prednisone was reduced to 20mg  daily.   Patient endorses feeling tired today and notes her breathing still feels somewhat labored. Her daughter is at bedside and is concerned that she has only made 100cc urine since admission last night. Patient unable to tell me how much urine she makes at baseline. Daughter suspects it's more than what she's making currently, but "not as much as you or I make".   Creatinine, Ser  Date/Time Value Ref Range Status  06/25/2021 01:20 AM 4.82 (H) 0.44 - 1.00 mg/dL Final  47/82/9562 13:08 AM 4.27 (H) 0.44 - 1.00 mg/dL Final  65/78/4696 29:52 AM 4.19 (H) 0.44 - 1.00 mg/dL Final  84/13/2440 10:27 PM 4.47 (H) 0.44 - 1.00 mg/dL Final  25/36/6440 34:74 PM 3.47 (H) 0.44 - 1.00 mg/dL Final  25/95/6387 56:43 AM 5.36 (H) 0.44 - 1.00 mg/dL Final  32/95/1884 16:60 AM 5.88  (H) 0.44 - 1.00 mg/dL Final  63/02/6008 93:23 AM 5.80 (H) 0.44 - 1.00 mg/dL Final  55/73/2202 54:27 AM 5.73 (H) 0.44 - 1.00 mg/dL Final  08/11/7626 31:51 AM 5.12 (H) 0.44 - 1.00 mg/dL Final  76/16/0737 10:62 AM 5.14 (H) 0.44 - 1.00 mg/dL Final  69/48/5462 70:35 AM 5.20 (H) 0.44 - 1.00 mg/dL Final  00/93/8182 99:37 AM 5.10 (H) 0.44 - 1.00 mg/dL Final  16/96/7893 81:01 AM 4.78 (H) 0.44 - 1.00 mg/dL Final  75/11/2583 27:78 AM 5.02 (H) 0.44 - 1.00 mg/dL Final  24/23/5361 44:31 AM 4.85 (H) 0.44 - 1.00 mg/dL Final  54/00/8676 19:50 AM 4.94 (H) 0.44 - 1.00 mg/dL Final  93/26/7124 58:09 PM 2.23 (H) 0.44 - 1.00 mg/dL Final  98/33/8250 53:97 PM 1.18 (H) 0.44 - 1.00 mg/dL Final  67/34/1937 90:24 AM 1.29 (H) 0.44 - 1.00 mg/dL Final  09/73/5329 92:42 AM 1.29 (H) 0.44 - 1.00 mg/dL Final  68/34/1962 22:97 AM 1.02 (H) 0.44 - 1.00 mg/dL Final  98/92/1194 17:40 AM 1.13 (H) 0.44 - 1.00 mg/dL Final  81/44/8185 63:14 PM 1.05 (H) 0.44 - 1.00 mg/dL Final  97/03/6376 58:85 PM 0.77 0.44 - 1.00 mg/dL Final  02/77/4128 78:67 PM 0.99 0.44 - 1.00 mg/dL Final  67/20/9470 96:28 PM 0.71 0.44 - 1.00 mg/dL Final  36/62/9476 54:65 AM 0.86 0.44 - 1.00 mg/dL Final  03/54/6568 12:75 AM 0.99 0.44 - 1.00 mg/dL Final  17/00/1749 44:96 AM 0.73 0.44 - 1.00 mg/dL Final  75/91/6384 66:59 AM 0.76 0.44 - 1.00 mg/dL Final  93/57/0177 93:90 AM 0.88 0.44 - 1.00 mg/dL Final  05/05/2016 12:50 AM 0.82 0.44 - 1.00 mg/dL Final  14/78/2956 21:30 AM 0.86 0.44 - 1.00 mg/dL Final  86/57/8469 62:95 AM 0.76 0.44 - 1.00 mg/dL Final  28/41/3244 01:02 AM 0.64 0.44 - 1.00 mg/dL Final  72/53/6644 03:47 AM 0.94 0.44 - 1.00 mg/dL Final     PMHx:   Past Medical History:  Diagnosis Date   Arthritis    Atrial fibrillation (HCC)    CHF (congestive heart failure) (HCC)    DM2 (diabetes mellitus, type 2) (HCC) 06/24/2021   HLD (hyperlipidemia)    Hypertension     Past Surgical History:  Procedure Laterality Date   COLONOSCOPY WITH PROPOFOL N/A  01/19/2021   hemorrhoids on perianal exam, examined portion ofileum normal, entire examined colon, normal. 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 no specimens   GIVENS CAPSULE STUDY N/A 02/02/2021   Procedure: GIVENS CAPSULE STUDY;  Surgeon: Dolores Frame, MD;  [facility];  Service: Gastroenterology;  Laterality: N/A;  7:30   LEFT HEART CATH AND CORONARY ANGIOGRAPHY Right 05/06/2016   Procedure: Left Heart Cath and Coronary Angiography;  Surgeon: Laurier Nancy, MD;  Location: [facility] INVASIVE CV LAB;  Service: Cardiovascular;  Laterality: Right;    Family Hx:  Family History  Problem Relation Age of Onset   Diabetes Mother    Heart disease Mother    Heart attack Father    Diabetes Brother    Breast cancer Maternal Grandmother    Breast cancer Maternal Aunt    Colon cancer Neg Hx    Colon polyps Neg Hx     Social History:  reports that she has never smoked. She has never used smokeless tobacco. She reports that she does not drink alcohol and does not use drugs.  Allergies: No Known Allergies  Medications: Prior to Admission medications   Medication Sig Start Date End Date Taking? Authorizing Provider  acetaminophen (TYLENOL) 325 MG tablet Take 2 tablets (650 mg total) by mouth every 6 (six) hours as needed for mild pain (or Fever >/= 101). 06/24/21   Arnetha Courser, MD  albuterol (PROVENTIL HFA;VENTOLIN HFA) 108 (90 Base) MCG/ACT inhaler Inhale 2 puffs into the lungs daily as needed for wheezing or shortness of breath. 06/11/16   Delma Freeze, FNP  amLODipine (NORVASC) 5 MG tablet Take 1 tablet (5 mg total) by mouth daily. 03/17/21   Haydee Monica, MD  apixaban (ELIQUIS) 5 MG TABS tablet Take 1 tablet (5 mg  total) by mouth 2 (two) times daily. 06/11/16   Delma Freeze, FNP  atorvastatin (LIPITOR) 40 MG tablet Take 1 tablet (40 mg total) by mouth daily at 6 PM. 06/11/16   Delma Freeze, FNP  azithromycin (ZITHROMAX) 500 MG tablet Take 1 tablet (500 mg total) by mouth daily for 3 days. Take 1 tablet daily for 3 days. 06/24/21 06/27/21  Arnetha Courser, MD  benzonatate (TESSALON) 100 MG capsule Take 1 capsule (100 mg total) by mouth 3 (three) times daily as needed for cough. 06/24/21   Arnetha Courser, MD  blood glucose meter kit and supplies KIT Dispense based on patient and insurance preference. Use up to four times daily as directed. 01/20/21   Sherryll Burger, Pratik D, DO  ceFEPIme 2 g in sodium chloride 0.9 % 100 mL Inject  2 g into the vein daily. 06/25/21   Arnetha Courser, MD  ferrous sulfate 325 (65 FE) MG tablet Take 1 tablet (325 mg total) by mouth daily. 01/20/21 01/20/22  Sherryll Burger, Pratik D, DO  furosemide (LASIX) 40 MG tablet Take 40 mg by mouth daily. 06/05/21   [provider]  insulin aspart (NOVOLOG) 100 UNIT/ML injection Inject 0-9 Units into the skin 3 (three) times daily with meals. 06/24/21   Arnetha Courser, MD  ipratropium-albuterol (DUONEB) 0.5-2.5 (3) MG/3ML SOLN Take 3 mLs by nebulization every 6 (six) hours. 06/24/21   Arnetha Courser, MD  metoprolol tartrate (LOPRESSOR) 25 MG tablet Take 0.5 tablets (12.5 mg total) by mouth 2 (two) times daily. 06/24/21   Arnetha Courser, MD  ondansetron (ZOFRAN) 4 MG tablet Take 1 tablet (4 mg total) by mouth every 6 (six) hours as needed for nausea. 06/24/21   Arnetha Courser, MD  ondansetron (ZOFRAN) 4 MG/2ML SOLN injection Inject 2 mLs (4 mg total) into the vein every 6 (six) hours as needed for nausea. 06/24/21   Arnetha Courser, MD  pantoprazole (PROTONIX) 40 MG tablet Take 1 tablet (40 mg total) by mouth daily. 01/20/21 01/20/22  Sherryll Burger, Pratik D, DO  patiromer (VELTASSA) 8.4 g packet Take 2 packets (16.8 g total) by mouth daily. 06/25/21   Arnetha Courser, MD  predniSONE (DELTASONE) 20  MG tablet Take 1 tablet (20 mg total) by mouth daily. 06/25/21   Arnetha Courser, MD  sodium bicarbonate 650 MG tablet Take 2 tablets (1,300 mg total) by mouth 2 (two) times daily. 06/24/21   Arnetha Courser, MD  spironolactone (ALDACTONE) 25 MG tablet Take 25 mg by mouth daily. 04/06/21   [provider]    I have reviewed the patient's current medications.  Labs:  Results for orders placed or performed during the hospital encounter of 06/24/21 (from the past 48 hour(s))  Glucose, capillary     Status: Abnormal   Collection Time: 06/24/21 10:28 PM  Result Value Ref Range   Glucose-Capillary 153 (H) 70 - 99 mg/dL    Comment: Glucose reference range applies only to samples taken after fasting for at least 8 hours.  Phosphorus     Status: Abnormal   Collection Time: 06/25/21  1:20 AM  Result Value Ref Range   Phosphorus 6.9 (H) 2.5 - 4.6 mg/dL    Comment: Performed at [facility], [address], [place], [place] [address]  Magnesium     Status: Abnormal   Collection Time: 06/25/21  1:20 AM  Result Value Ref Range   Magnesium 1.3 (L) 1.7 - 2.4 mg/dL    Comment: Performed at [facility], [address] [place], [place] [address]  Comprehensive metabolic panel     Status: Abnormal   Collection Time: 06/25/21  1:20 AM  Result Value Ref Range   Sodium 140 135 - 145 mmol/L   Potassium 5.0 3.5 - 5.1 mmol/L   Chloride 114 (H) 98 - 111 mmol/L   CO2 13 (L) 22 - 32 mmol/L   Glucose, Bld 146 (H) 70 - 99 mg/dL    Comment: Glucose reference range applies only to samples taken after fasting for at least 8 hours.   BUN 101 (H) 8 - 23 mg/dL   Creatinine, Ser 7.25 (H) 0.44 - 1.00 mg/dL   Calcium 7.4 (L) 8.9 - 10.3 mg/dL   Total Protein 5.2 (L) 6.5 - 8.1 g/dL   Albumin 2.0 (L) 3.5 - 5.0 g/dL   AST  22 15 - 41 U/L   ALT 16 0 - 44 U/L   Alkaline Phosphatase 55 38 - 126 U/L   Total Bilirubin 1.0 0.3 - 1.2 mg/dL   GFR, Estimated 9 (L) >60 mL/min    Comment: (NOTE) Calculated using  the CKD-EPI Creatinine Equation (2021)    Anion gap 13 5 - 15    Comment: Performed at [facility], [address], [place], [place] [address]  CBC with Differential/Platelet     Status: Abnormal   Collection Time: 06/25/21  1:20 AM  Result Value Ref Range   WBC 10.1 4.0 - 10.5 K/uL   RBC 2.57 (L) 3.87 - 5.11 MIL/uL   Hemoglobin 7.9 (L) 12.0 - 15.0 g/dL   HCT 63.8 (L) 75.6 - 43.3 %   MCV 95.3 80.0 - 100.0 fL   MCH 30.7 26.0 - 34.0 pg   MCHC 32.2 30.0 - 36.0 g/dL   RDW 29.5 18.8 - 41.6 %   Platelets 104 (L) 150 - 400 K/uL    Comment: Immature Platelet Fraction may be clinically indicated, consider ordering this additional test [id number] REPEATED TO VERIFY    nRBC 0.2 0.0 - 0.2 %   Neutrophils Relative % 83 %   Neutro Abs 8.5 (H) 1.7 - 7.7 K/uL   Lymphocytes Relative 2 %   Lymphs Abs 0.2 (L) 0.7 - 4.0 K/uL   Monocytes Relative 2 %   Monocytes Absolute 0.2 0.1 - 1.0 K/uL   Eosinophils Relative 0 %   Eosinophils Absolute 0.0 0.0 - 0.5 K/uL   Basophils Relative 1 %   Basophils Absolute 0.1 0.0 - 0.1 K/uL   Immature Granulocytes 12 %   Abs Immature Granulocytes 1.25 (H) 0.00 - 0.07 K/uL    Comment: Performed at [facility], [address], [place], [place] [address]  Brain natriuretic peptide     Status: Abnormal   Collection Time: 06/25/21  1:20 AM  Result Value Ref Range   B Natriuretic Peptide 322.0 (H) 0.0 - 100.0 pg/mL    Comment: Performed at [facility], [address], [place], [place] [address]  Procalcitonin - Baseline     Status: None   Collection Time: 06/25/21  1:20 AM  Result Value Ref Range   Procalcitonin 64.87 ng/mL    Comment:        Interpretation: PCT >= 10 ng/mL: Important systemic inflammatory response, almost exclusively due to severe bacterial sepsis or septic shock. (NOTE)       Sepsis PCT Algorithm           Lower Respiratory Tract                                      Infection PCT Algorithm    ----------------------------      ----------------------------         PCT < 0.25 ng/mL                PCT < 0.10 ng/mL          Strongly encourage             Strongly discourage   discontinuation of antibiotics    initiation of antibiotics    ----------------------------     -----------------------------       PCT 0.25 - 0.50 ng/mL            PCT 0.10 -  0.25 ng/mL               OR       >80% decrease in PCT            Discourage initiation of                                            antibiotics      Encourage discontinuation           of antibiotics    ----------------------------     -----------------------------         PCT >= 0.50 ng/mL              PCT 0.26 - 0.50 ng/mL                AND       <80% decrease in PCT             Encourage initiation of                                             antibiotics       Encourage continuation           of antibiotics    ----------------------------     -----------------------------        PCT >= 0.50 ng/mL                  PCT > 0.50 ng/mL               AND         increase in PCT                  Strongly encourage                                      initiation of antibiotics    Strongly encourage escalation           of antibiotics                                     -----------------------------                                           PCT <= 0.25 ng/mL                                                 OR                                        > 80% decrease in PCT  Discontinue / Do not initiate                                             antibiotics  Performed at [facility], [address], [place], [place] [address]   C-reactive protein     Status: Abnormal   Collection Time: 06/25/21  7:08 AM  Result Value Ref Range   CRP 22.8 (H) <1.0 mg/dL    Comment: Performed at [facility], [address], [place], [place] [address]  Glucose, capillary     Status: Abnormal   Collection Time: 06/25/21  7:42 AM   Result Value Ref Range   Glucose-Capillary 110 (H) 70 - 99 mg/dL    Comment: Glucose reference range applies only to samples taken after fasting for at least 8 hours.     Physical Exam: Vitals:   06/25/21 0741 06/25/21 0744  BP: 134/68   Pulse: 83 89  Resp: (!) 27 20  Temp: 99.1 F (37.3 C)   SpO2: 93%      General exam: Alert, very tired appearing Respiratory system: Tachypneic, lungs CTAB without wheezes or rales appreciated Cardiovascular system: RRR, normal S1/S2 without m/r/g Gastrointestinal system: soft, nontender, nondistended Central nervous system: Alert and oriented Extremities: No peripheral edema Skin: No rashes Psychiatry: Judgement and insight appear normal  Assessment/Plan: Acute kidney injury on CKD stage IV Etiology of chronic kidney disease is p-ANCA vasculitis (diagnosed January 2023), with her current acute kidney injury likely secondary to sepsis as well as worsening of underlying glomerular disease. Renal function slightly worse today from yesterday (Cr 4.82, BUN 101) and minimal UOP noted. Meets criteria for dialysis at this point given her persistent acidosis and presence of electrolyte abnormalities (phos 6.9, K 5.0 on daily Veltassa). Renal ultrasound pending. Will increase prednisone to 60mg  daily while patient thinks about whether she'd want dialysis or not. If not, will need to involve palliative. Non-Anion Gap Metabolic Acidosis Bicarb 13 on this morning's labs. Felt to be secondary to progressive renal dysfunction. s/p bicarb infusion at [facility], now on oral bicarb 1300mg  BID. Hyperkalemia; Hyperphosphatemia K 5.0 this morning. Continue daily Veltassa.  Phos 6.9. Monitor closely on daily labs. Patient deciding whether she would want dialysis or not. Anemia Likely secondary to chronic disease, Hgb 7.9 this morning, from baseline ~9. Consider obtaining iron studies to determine need for ESA. HTN Well-controlled since admission. Continue home Lopressor.  Primary team holding home amlodipine, Lasix, aldactone 6. HFpEF Appears mildly fluid overloaded on exam today. Iv Lasix 40mg  x1 today.  7. Sepsis Antibiotics per primary. On Cefepime and Azithromycin.   Maury Dus 06/25/2021, 9:08 AM  PGY-2 [facility]    Seen and examined independently.  Agree with note and exam as documented above by resident physician and as noted here.  Patient presented to outside facility with fatigue. Hx of pauci immune GN.  S/p rituxan x 2 (due for redose later this month) and follows with Dr. Arrie Aran. Rituxan 03/15/21 and again in Feb.  Last labs with Cr 3.47 on 05/07/21.  She is on pred 20 mg daily on presentation.  She and I and her daughter spoke at bedside and then her daughter spoke with me after rounds as well.  She had contacted RN after rounds and then she and I met in person again.  Initially they wanted to think about  it but they do now want her to have dialysis.  Her daughter is concerned her mother is a little confused   General adult female in bed in no acute distress HEENT normocephalic atraumatic extraocular movements intact sclera anicteric Neck supple trachea midline Lungs crackles on auscultation  Heart regular rate and rhythm no rubs or gallops appreciated Abdomen soft nontender nondistended Extremities no pitting edema  Psych normal mood and affect   # AKI  - Pauci immune glomerulonephritis.  She has some uremic symptoms and metabolic derangements for which HD is indicated - Increase pred back to 60 mg daily  - Start HD  - I have consulted IR for tunn catheter - they are placing temp instead citing blood cx from 5/5 - stopped eliquis for catheter - notified primary team  - Plan for first HD on 5/9 after access in place - she may have ultimately progressed to ESRD.  Follow response to HD and UOP  - start protonix for GI ppx - rituxan due later in May if continued   # CKD stage V - Secondary to GN as above - GFR was at 14  in March   # Heart failure with preserved EF  - Lasix IV once now to optimize resp status   # CAP  - abx per primary team   # E coli Bacteremia - note e coli and enterobacterales in blood last check  - update blood cultures  - abx per primary team   # Metabolic acidosis  - secondary to renal failure  - Stop bicarb once on HD  # Hyperkalemia - improved from presentation  - stop veltassa; starting HD  # HTN  - optimize volume with HD  # Anemia CKD  - start ESA if pt willing. Discuss on 5/9.  - check iron stores  Continue inpatient monitoring.   Estanislado Emms, MD 06/25/2021  2:15 PM

## 2021-06-25 NOTE — Plan of Care (Signed)
  Problem: Education: Goal: Knowledge of General Education information will improve Description: Including pain rating scale, medication(s)/side effects and non-pharmacologic comfort measures Outcome: Progressing   Problem: Clinical Measurements: Goal: Ability to maintain clinical measurements within normal limits will improve Outcome: Progressing Goal: Will remain free from infection Outcome: Progressing Goal: Diagnostic test results will improve Outcome: Progressing Goal: Respiratory complications will improve Outcome: Progressing Goal: Cardiovascular complication will be avoided Outcome: Progressing   Problem: Activity: Goal: Risk for activity intolerance will decrease Outcome: Progressing   Problem: Nutrition: Goal: Adequate nutrition will be maintained Outcome: Progressing   Problem: Coping: Goal: Level of anxiety will decrease Outcome: Progressing   Problem: Elimination: Goal: Will not experience complications related to bowel motility Outcome: Progressing Goal: Will not experience complications related to urinary retention Outcome: Progressing   Problem: Safety: Goal: Ability to remain free from injury will improve Outcome: Progressing   Problem: Skin Integrity: Goal: Risk for impaired skin integrity will decrease Outcome: Progressing   

## 2021-06-25 NOTE — TOC Initial Note (Signed)
Transition of Care (TOC) - Initial/Assessment Note  ? ? ?Patient Details  ?Name: Mackenzie [name] ?MRN: [id number] ?Date of Birth: 07/25/1950 ? ?Transition of Care [facility]) CM/SW Contact:    ?Ninfa Meeker, RN ?Phone Number: ?06/25/2021, 12:35 PM ? ?Clinical Narrative:     ?Transition of Care Screening Note:     ? ?Transition of Care Department [facility]) has reviewed patient and no TOC needs have been identified at this time. We will continue to monitor patient advancement through Interdisciplinary progressions. If new patient transition needs arise, please place a consult.           ? ? ?  ?  ? ? ?Patient Goals and CMS Choice ?  ?  ?  ? ?Expected Discharge Plan and Services ?  ?  ?  ?  ?  ?                ?  ?  ?  ?  ?  ?  ?  ?  ?  ?  ? ?Prior Living Arrangements/Services ?  ?  ?  ?       ?  ?  ?  ?  ? ?Activities of Daily Living ?  ?  ? ?Permission Sought/Granted ?  ?  ?   ?   ?   ?   ? ?Emotional Assessment ?  ?  ?  ?  ?  ?  ? ?Admission diagnosis:  Acute renal failure superimposed on stage 4 chronic kidney disease ([place]) [N17.9, N18.4] ?Patient Active Problem List  ? Diagnosis Date Noted  ? Acute renal failure superimposed on stage 4 chronic kidney disease ([place]) 06/24/2021  ? Severe sepsis ([place]) 06/24/2021  ? UTI (urinary tract infection) 06/24/2021  ? E coli bacteremia 06/24/2021  ? CAP (community acquired pneumonia) 06/24/2021  ? DM2 (diabetes mellitus, type 2) ([place]) 06/24/2021  ? Metabolic acidosis 07/62/2633  ? Hyperkalemia 06/23/2021  ? Chronic diastolic CHF (congestive heart failure) ([place]) 06/23/2021  ? Pneumonia of right lower lobe due to infectious organism   ? Sepsis ([place]) 06/22/2021  ? Acute on chronic renal failure ([place]) 03/06/2021  ? Melena   ? Dark stools   ? Normocytic anemia 01/15/2021  ? Syncope 01/15/2021  ? Influenza A 01/15/2021  ? Atrial fibrillation ([place]) 01/15/2021  ? Pneumonia due to influenza 01/15/2021  ? HTN (hypertension) 05/14/2016  ? Snoring 05/14/2016  ? Acute respiratory failure with hypoxia  ([place]) 05/01/2016  ? ?PCP:  [name], MD ?Pharmacy:   ?[place] ?[place] [address] ?Phone: [phone] Fax: [phone] ? ?[id number] - [place], [address] ?[employer] ?[place] [address] ?Phone: [phone] Fax: [phone] ? ? ? ? ?Social Determinants of Health (SDOH) Interventions ?  ? ?Readmission Risk Interventions ?   ? View : No data to display.  ?  ?  ?  ? ? ? ?

## 2021-06-25 NOTE — Progress Notes (Signed)
?                                  PROGRESS NOTE                                             ?                                                                                                                     ?                                         ? ? Patient Demographics:  ? ? Mackenzie [name], is a 71 y.o. female, DOB - [birth year], [id number] ? ?Outpatient Primary MD for the patient is [name], MD    LOS - 1  Admit date - 06/24/2021   ? ?No chief complaint on file. ?    ? ?Brief Narrative (HPI from H&P)   71 y.o. female with HTN, CAD, DM 2, HFpEF, CKD 5 and atrial fibrillation, her daughter noticed that patient started having progressive fatigue, malaise and then became progressively more short of breath with mild cough.  She saw her PCP where she was diagnosed with UTI, she was subsequently admitted to [facility] hospital for sepsis, pneumonia, acute hypoxic respiratory failure, AKI on CKD 5, she was then transferred to [facility] for further care upon family request. ? ? Subjective:  ? ? Mackenzie [name] today has, No headache, No chest pain, No abdominal pain - No Nausea, No new weakness tingling or numbness, mild cough and SOB. ? ? Assessment  & Plan :  ? ? ?Acute hypoxic respiratory failure due to acute on chronic diastolic CHF EF 10% on last echocardiogram 1 year ago along with community-acquired pneumonia with severe sepsis upon admission at [facility] -she is currently on IV antibiotics which will be continued, will defer diuretics to nephrology however I do think she is in fluid overload at this time and could be major issue with her hypoxic respiratory failure, continue supplemental oxygen, encouraged to sit in chair use I-S and flutter valve for pulmonary toiletry. ? 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  Nephrology has been consulted. ? ?E. coli bacteremia.  Likely source UTI which was diagnosed at PCP office.  Continue antibiotics.  Clinically sepsis pathophysiology is depressing. ? ?Paroxysmal atrial fibrillation (HCC) with [place] vasc 2 score of 5.  Continue beta-blocker and Eliquis combination. ? ?Acute on chronic diastolic CHF with EF 54% on echocardiogram in November 2022.  Kindly see #1 above. ? ?HTN (hypertension) - currently on beta-blocker, monitor with as needed hydralazine in addition to beta-blocker. ? ?Anemia of chronic disease.  Monitor. ? ?Hypomagnesemia.  Replaced. ? ?DM2 (diabetes mellitus, type 2) ([place]) - ISS ? ?Lab Results  ?Component Value Date  ? HGBA1C 5.8 (H) 01/15/2021  ? ? ?CBG (last 3)  ?Recent Labs  ?  06/24/21 ?1613 06/24/21 ?2228 06/25/21 ?0742  ?GLUCAP 196* 153* 110*  ? ? ?   ? ?Condition - Extremely Guarded ? ?Family Communication  :  family member bedside ? ?Code Status :  Full ? ?Consults  :  Renal ? ?PUD Prophylaxis :   ? ? Procedures  :    ? ?CT  -  Large bilateral airspace opacities are noted, right greater than left, consistent with multifocal pneumonia. Coronary artery calcifications are noted. No acute abnormality seen in the abdomen or pelvis. Aortic Atherosclerosis ? ?   ? ?Disposition Plan  :   ? ?Status is: Inpatient ? ?DVT Prophylaxis  :   ? ?SCDs Start: 06/24/21 2213 ?apixaban (ELIQUIS) tablet 5 mg  ?  ? ?Lab Results  ?Component Value Date  ? PLT 104 (L) 06/25/2021   ? ? ?Diet :  ?Diet Order   ? ?       ?  Diet renal with fluid restriction Fluid restriction: 1200 mL Fluid; Room service appropriate? Yes; Fluid consistency: Thin  Diet effective now       ?  ? ?  ?  ? ?  ?  ? ?Inpatient Medications ? ?Scheduled Meds: ? apixaban  5 mg Oral BID  ? atorvastatin  40 mg Oral q1800  ? azithromycin  500 mg Oral Daily  ? insulin aspart  0-6 Units Subcutaneous TID WC  ? metoprolol tartrate  12.5 mg Oral BID  ? patiromer  16.8 g Oral Daily  ? predniSONE  20 mg Oral Daily  ? sodium bicarbonate  1,300 mg Oral BID  ? ?Continuous Infusions: ? ceFEPIme (MAXIPIME) 2 GM IVPB (Mini-Bag Plus) 2 g (06/24/21 2333)  ? ?PRN Meds:.acetaminophen **OR** acetaminophen, albuterol, benzonatate ? Time Spent in minutes  30 ? ? ?Lala Lund M.D on 06/25/2021 at 11:00 AM ? ?To page go to [email or web address]  ? ?[facility] -  Office  [phone] ? ?See all Orders from today for further details ? ? ? Objective:  ? ?Vitals:  ? 06/25/21 0500 06/25/21 0600 06/25/21 0741 06/25/21 0744  ?BP: 115/65 126/62 134/68   ?Pulse: 81 85 83 89  ?Resp: (!) 23 (!) 29 (!) 27 20  ?Temp:   99.1 ?F (37.3 ?C)   ?TempSrc:   Oral   ?SpO2:   93%   ?Weight:      ?Height:      ? ? ?Wt Readings from Last 3 Encounters:  ?06/24/21 74.3 kg  ?06/22/21 72.6 kg  ?05/07/21 78.9 kg  ? ? ? ?Intake/Output Summary (Last 24 hours) at 06/25/2021 1100 ?Last data filed at 06/25/2021 0200 ?Gross per 24 hour  ?Intake 100  ml  ?Output --  ?Net 100 ml  ? ? ? ?Physical Exam ? ?Awake Alert, No new F.N deficits, Normal affect ?[place].AT,PERRAL ?Supple Neck, No JVD,   ?Symmetrical Chest wall movement, Good air movement bilaterally, ++ rales ?RRR,No Gallops,Rubs or new Murmurs,  ?+ve B.Sounds, Abd Soft, No tenderness,   ?No Cyanosis, Clubbing or edema  ?   ? ? Data Review:  ? ? ?CBC ?Recent Labs  ?Lab 06/22/21 ?1240 06/23/21 ?3825 06/25/21 ?0120  ?WBC 5.4 7.7 10.1  ?HGB 11.0* 8.6* 7.9*  ?HCT 35.5* 27.1* 24.5*  ?PLT 129* 114* 104*  ?MCV 97.0 95.8 95.3  ?MCH 30.1 30.4 30.7   ?MCHC 31.0 31.7 32.2  ?RDW 14.6 14.7 15.0  ?LYMPHSABS 0.2*  --  0.2*  ?MONOABS 0.2  --  0.2  ?EOSABS 0.0  --  0.0  ?BASOSABS 0.1  --  0.1  ? ? ?Electrolytes ?Recent Labs  ?Lab 06/22/21 ?1240 06/22/21 ?1440 06/23/21 ?0539 06/23/21 ?1729 06/24/21 ?7673 06/25/21 ?0120 06/25/21 ?4193  ?NA 136  --  139  --  144 140  --   ?K 5.7*  --  5.7*  --  4.7 5.0  --   ?CL 112*  --  113*  --  114* 114*  --   ?CO2 10*  --  14*  --  15* 13*  --   ?GLUCOSE 204*  --  147*  --  105* 146*  --   ?BUN 104*  --  96*  --  100* 101*  --   ?CREATININE 4.47*  --  4.19*  --  4.27* 4.82*  --   ?CALCIUM 8.5*  --  7.5*  --  7.4* 7.4*  --   ?AST 24  --  24  --   --  22  --   ?ALT 14  --  15  --   --  16  --   ?ALKPHOS 48  --  37*  --   --  55  --   ?BILITOT 0.8  --  0.8  --   --  1.0  --   ?ALBUMIN 3.0*  --  2.1*  --   --  2.0*  --   ?MG  --   --   --   --   --  1.3*  --   ?CRP  --   --   --   --   --   --  22.8*  ?PROCALCITON  --   --   --  76.05 80.02 64.87  --   ?LATICACIDVEN 4.4* 5.7*  --  1.2  --   --   --   ?INR 3.7*  --   --   --   --   --   --   ?BNP  --   --   --  224.3*  --  322.0*  --   ? ? ?------------------------------------------------------------------------------------------------------------------ ?No results for input(s): CHOL, HDL, LDLCALC, TRIG, CHOLHDL, LDLDIRECT in the last 72 hours. ? ?Lab Results  ?Component Value Date  ? HGBA1C 5.8 (H) 01/15/2021  ? ? ?No results for input(s): TSH, T4TOTAL, T3FREE, THYROIDAB in the last 72 hours. ? ?Invalid input(s): FREET3 ?------------------------------------------------------------------------------------------------------------------ ?ID Labs ?Recent Labs  ?Lab 06/22/21 ?1240 06/22/21 ?1440 06/23/21 ?7902 06/23/21 ?1729 06/24/21 ?4097 06/25/21 ?0120 06/25/21 ?3532  ?WBC 5.4  --  7.7  --   --  10.1  --   ?PLT 129*  --  114*  --   --  104*  --   ?  CRP  --   --   --   --   --   --  22.8*  ?PROCALCITON  --   --   --  76.05 80.02 64.87  --   ?LATICACIDVEN 4.4* 5.7*  --  1.2  --   --   --    ?CREATININE 4.47*  --  4.19*  --  4.27* 4.82*  --   ? ? ? ?Radiology Reports ?DG Chest Port 1 View ? ?Result Date: 06/25/2021 ?CLINICAL DATA:  71 year old female with shortness of breath. Atrial fibrillation

## 2021-06-26 ENCOUNTER — Inpatient Hospital Stay (HOSPITAL_COMMUNITY): Payer: Medicare Other

## 2021-06-26 HISTORY — PX: IR US GUIDE VASC ACCESS RIGHT: IMG2390

## 2021-06-26 HISTORY — PX: IR FLUORO GUIDE CV LINE RIGHT: IMG2283

## 2021-06-26 LAB — COMPREHENSIVE METABOLIC PANEL
ALT: 14 U/L (ref 0–44)
AST: 19 U/L (ref 15–41)
Albumin: 1.9 g/dL — ABNORMAL LOW (ref 3.5–5.0)
Alkaline Phosphatase: 57 U/L (ref 38–126)
Anion gap: 14 (ref 5–15)
BUN: 106 mg/dL — ABNORMAL HIGH (ref 8–23)
CO2: 12 mmol/L — ABNORMAL LOW (ref 22–32)
Calcium: 7.9 mg/dL — ABNORMAL LOW (ref 8.9–10.3)
Chloride: 110 mmol/L (ref 98–111)
Creatinine, Ser: 4.91 mg/dL — ABNORMAL HIGH (ref 0.44–1.00)
GFR, Estimated: 9 mL/min — ABNORMAL LOW (ref >60)
Glucose, Bld: 135 mg/dL — ABNORMAL HIGH (ref 70–99)
Potassium: 4.9 mmol/L (ref 3.5–5.1)
Sodium: 136 mmol/L (ref 135–145)
Total Bilirubin: 0.8 mg/dL (ref 0.3–1.2)
Total Protein: 5.1 g/dL — ABNORMAL LOW (ref 6.5–8.1)

## 2021-06-26 LAB — CBC WITH DIFFERENTIAL/PLATELET
Abs Immature Granulocytes: 1.23 10*3/uL — ABNORMAL HIGH (ref 0.00–0.07)
Basophils Absolute: 0 10*3/uL (ref 0.0–0.1)
Basophils Relative: 0 %
Eosinophils Absolute: 0 10*3/uL (ref 0.0–0.5)
Eosinophils Relative: 0 %
HCT: 25.1 % — ABNORMAL LOW (ref 36.0–46.0)
Hemoglobin: 8.1 g/dL — ABNORMAL LOW (ref 12.0–15.0)
Immature Granulocytes: 13 %
Lymphocytes Relative: 2 %
Lymphs Abs: 0.2 10*3/uL — ABNORMAL LOW (ref 0.7–4.0)
MCH: 30.7 pg (ref 26.0–34.0)
MCHC: 32.3 g/dL (ref 30.0–36.0)
MCV: 95.1 fL (ref 80.0–100.0)
Monocytes Absolute: 0.2 10*3/uL (ref 0.1–1.0)
Monocytes Relative: 2 %
Neutro Abs: 8.1 10*3/uL — ABNORMAL HIGH (ref 1.7–7.7)
Neutrophils Relative %: 83 %
Platelets: 108 10*3/uL — ABNORMAL LOW (ref 150–400)
RBC: 2.64 MIL/uL — ABNORMAL LOW (ref 3.87–5.11)
RDW: 15.1 % (ref 11.5–15.5)
WBC: 9.8 10*3/uL (ref 4.0–10.5)
nRBC: 0.2 % (ref 0.0–0.2)

## 2021-06-26 LAB — MAGNESIUM: Magnesium: 2 mg/dL (ref 1.7–2.4)

## 2021-06-26 LAB — C-REACTIVE PROTEIN: CRP: 19.4 mg/dL — ABNORMAL HIGH (ref <1.0)

## 2021-06-26 LAB — PROCALCITONIN: Procalcitonin: 40.53 ng/mL

## 2021-06-26 LAB — GLUCOSE, CAPILLARY
Glucose-Capillary: 114 mg/dL — ABNORMAL HIGH (ref 70–99)
Glucose-Capillary: 81 mg/dL (ref 70–99)
Glucose-Capillary: 99 mg/dL (ref 70–99)
Glucose-Capillary: 90 mg/dL (ref 70–99)

## 2021-06-26 LAB — BRAIN NATRIURETIC PEPTIDE: B Natriuretic Peptide: 350.2 pg/mL — ABNORMAL HIGH (ref 0.0–100.0)

## 2021-06-26 MED ORDER — CEFAZOLIN SODIUM-DEXTROSE 2-4 GM/100ML-% IV SOLN
2.0000 g | INTRAVENOUS | Status: DC
  Administered 2021-06-26 – 2021-07-01 (×6): 2 g via INTRAVENOUS
  Filled 2021-06-26 (×6): qty 100

## 2021-06-26 MED ORDER — CHLORHEXIDINE GLUCONATE CLOTH 2 % EX PADS
6.0000 | MEDICATED_PAD | Freq: Every day | CUTANEOUS | Status: DC
  Administered 2021-06-27: 6 via TOPICAL

## 2021-06-26 MED ORDER — DARBEPOETIN ALFA 40 MCG/0.4ML IJ SOSY
40.0000 ug | PREFILLED_SYRINGE | INTRAMUSCULAR | Status: DC
  Administered 2021-06-26: 40 ug via SUBCUTANEOUS
  Filled 2021-06-26: qty 0.4

## 2021-06-26 MED ORDER — CEFAZOLIN SODIUM-DEXTROSE 1-4 GM/50ML-% IV SOLN: 1.0000 g | INTRAVENOUS | Status: DC

## 2021-06-26 MED ORDER — LIDOCAINE HCL 1 % IJ SOLN
INTRAMUSCULAR | Status: AC
  Administered 2021-06-26: 10 mL
  Filled 2021-06-26: qty 20

## 2021-06-26 MED ORDER — HEPARIN SODIUM (PORCINE) 1000 UNIT/ML IJ SOLN
INTRAMUSCULAR | Status: AC
Start: 2021-06-26 — End: 2021-06-26
  Administered 2021-06-26: 2.6 mL
  Filled 2021-06-26: qty 10

## 2021-06-26 MED ORDER — APIXABAN 5 MG PO TABS
5.0000 mg | ORAL_TABLET | Freq: Two times a day (BID) | ORAL | Status: DC
  Administered 2021-06-26 – 2021-06-27 (×3): 5 mg via ORAL
  Filled 2021-06-26 (×5): qty 1

## 2021-06-26 NOTE — Plan of Care (Signed)
  Problem: Education: Goal: Knowledge of General Education information will improve Description: Including pain rating scale, medication(s)/side effects and non-pharmacologic comfort measures Outcome: Progressing   Problem: Clinical Measurements: Goal: Ability to maintain clinical measurements within normal limits will improve Outcome: Progressing Goal: Will remain free from infection Outcome: Progressing Goal: Diagnostic test results will improve Outcome: Progressing Goal: Respiratory complications will improve Outcome: Progressing Goal: Cardiovascular complication will be avoided Outcome: Progressing   Problem: Activity: Goal: Risk for activity intolerance will decrease Outcome: Progressing   Problem: Nutrition: Goal: Adequate nutrition will be maintained Outcome: Progressing   Problem: Coping: Goal: Level of anxiety will decrease Outcome: Progressing   Problem: Elimination: Goal: Will not experience complications related to bowel motility Outcome: Progressing Goal: Will not experience complications related to urinary retention Outcome: Progressing   Problem: Safety: Goal: Ability to remain free from injury will improve Outcome: Progressing   Problem: Skin Integrity: Goal: Risk for impaired skin integrity will decrease Outcome: Progressing   

## 2021-06-26 NOTE — Progress Notes (Signed)
RN attempted to have the pt signed consent form for tunnel cath. Pt's daughter is at the bedside and states " I do not know what is going to be done in the morning, I would like to have the provider come by and explained everything again". Pt refused to sign consent. RN will notify day shift.  ?

## 2021-06-26 NOTE — Procedures (Signed)
Seen and examined on dialysis.  Procedure supervised.  RIJ nontunneled HD catheter in use - this was just placed this am with IR.  Blood pressure 156/80 and HR 84.  Tolerating goal.   ? ?Plan for HD today and tomorrow.  Then hold Thursday and HD again on Friday.  ? ?Claudia Desanctis, MD ?06/26/2021 11:12 AM ? ?

## 2021-06-26 NOTE — Progress Notes (Addendum)
?[facility] ?Progress Note  ? ? 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 May have AKI in the setting of acute illness vs overt progression to ESRD. Renal function slightly worse today with Cr 4.91 and BUN 106. IR has placed R IJ temp HD catheter and she is receiving first HD session today. Plan for HD again tomorrow. Continue prednisone '60mg'$  daily. ?HFpEF- unfortunately UOP not well recorded s/p Lasix IV '60mg'$  yesterday. However breathing is much improved, appears more euvolemic than prior. ?CAP- antibiotics per primary ?E Coli Bacteremia- antibiotics per primary ?Metabolic Acidosis- secondary to renal failure. Stop bicarb with initiation of HD ?Hyperkalemia- improving. D/c'd Veltassa. ?Anemia of CKD- Hgb stable at 8.1 this morning. Consider starting ESA tomorrow. ? ?Subjective:   ?No acute events overnight. Patient reports she feels well this morning and her breathing is better than before. Only complaint is that she can't get anything good to eat.  ? ?Objective:   ?BP (!) 143/68 (BP Location: Right Arm)   Pulse 78   Temp (!) 97.5 ?F (36.4 ?C) (Oral)   Resp 17   Ht '5\' 6"'$  (1.676 m)   Wt 74.3 kg   SpO2 97%   BMI 26.44 kg/m?  ?No intake or output data in the 24 hours ending 06/26/21 0908 ?Weight change:  ? ?Physical Exam: ?Gen: alert, resting comfortably ?CVS: RRR, normal S1/S2 without m/r/g ?Resp: normal effort on 2L [place] ?Abd: soft, nontender ?Ext: no pedal edema ? ? ?Imaging: ?US RENAL ? ?Result Date: 06/25/2021 ?CLINICAL DATA:  Acute kidney injury. EXAM: RENAL / URINARY TRACT ULTRASOUND COMPLETE COMPARISON:  CT abdomen pelvis dated Jun 22, 2021. Renal ultrasound dated March 06, 2021. FINDINGS: Right Kidney: Renal measurements: 11.2 x 5.1 x 4.8 cm = volume: 144 mL. Unchanged increased echogenicity. No mass or hydronephrosis visualized. 1.0 cm simple cyst. No follow-up imaging is recommended. Left Kidney: Renal measurements: 10.6 x 5.9 x 5.7 cm =  volume: 186 mL. Unchanged increased echogenicity. No mass or hydronephrosis visualized. 1.0 cm simple cysts. No follow-up imaging is recommended. Bladder: Appears normal for degree of bladder distention. Other: None. IMPRESSION: 1. No acute abnormality. 2. Unchanged increased bilateral renal echogenicity, consistent with medical renal disease. Electronically Signed   By: Titus Dubin M.D.   On: 06/25/2021 11:38  ? ?DG Chest Port 1 View ? ?Result Date: 06/25/2021 ?CLINICAL DATA:  71 year old female with shortness of breath. Atrial fibrillation, CHF. Sepsis. EXAM: PORTABLE CHEST 1 VIEW COMPARISON:  CT Chest, Abdomen, and Pelvis 06/22/2021 and earlier. FINDINGS: Portable AP upright view at 0614 hours. Lung volumes, mediastinal contours and cardiomegaly appears stable. Multifocal right greater than left confluent airspace opacity. Right hemidiaphragm has become obscured since the prior CT. And there is increased confluent opacity in the right upper lobe now. But other bilateral perihilar ventilation has mildly improved. Small if any pleural effusions. No pneumothorax. Visualized tracheal air column is within normal limits. No acute osseous abnormality identified. Negative visible bowel gas. IMPRESSION: 1. Bilateral pneumonia with mixed response since 06/22/2021. Worsening right lower lobe collapse or consolidation. But some improved perihilar ventilation. 2. No new cardiopulmonary abnormality. Electronically Signed   By: Genevie Ann M.D.   On: 06/25/2021 06:25   ? ?Labs: ?BMET ?Recent Labs  ?Lab 06/22/21 ?1240 06/23/21 ?7353 06/24/21 ?0420 06/25/21 ?0120 06/26/21 ?0254  ?NA 136 139 144 140 136  ?K 5.7* 5.7* 4.7 5.0 4.9  ?CL 112* 113* 114* 114* 110  ?CO2 10* 14* 15*  13* 12*  ?GLUCOSE 204* 147* 105* 146* 135*  ?BUN 104* 96* 100* 101* 106*  ?CREATININE 4.47* 4.19* 4.27* 4.82* 4.91*  ?CALCIUM 8.5* 7.5* 7.4* 7.4* 7.9*  ?PHOS  --   --   --  6.9*  --   ? ?CBC ?Recent Labs  ?Lab 06/22/21 ?1240 06/23/21 ?6333 06/25/21 ?0120  06/26/21 ?0254  ?WBC 5.4 7.7 10.1 9.8  ?NEUTROABS 1.3*  --  8.5* 8.1*  ?HGB 11.0* 8.6* 7.9* 8.1*  ?HCT 35.5* 27.1* 24.5* 25.1*  ?MCV 97.0 95.8 95.3 95.1  ?PLT 129* 114* 104* 108*  ? ? ?Medications:   ? ? atorvastatin  40 mg Oral q1800  ? azithromycin  500 mg Oral Daily  ? Chlorhexidine Gluconate Cloth  6 each Topical Q0600  ? insulin aspart  0-6 Units Subcutaneous TID WC  ? metoprolol tartrate  12.5 mg Oral BID  ? pantoprazole  40 mg Oral Daily  ? predniSONE  60 mg Oral Daily  ? sodium bicarbonate  1,300 mg Oral BID  ? ? ? ?Alcus Dad, MD ?06/26/2021, 9:08 AM  ?PGY-2 [facility] ? ? ?Seen and examined independently.  Agree with note and exam as documented above by Dr. Rock Nephew and as noted here. ? ?General adult female in bed in no acute distress ?HEENT normocephalic atraumatic extraocular movements intact sclera anicteric ?Neck supple trachea midline ?Lungs crackles on auscultation  ?Heart regular rate and rhythm no rubs or gallops appreciated ?Abdomen soft nontender nondistended ?Extremities no pitting edema  ?Psych normal mood and affect ?Access :RIJ nontunneled HD catheter ? ?# AKI  ?- Pauci immune glomerulonephritis.  She has some uremic symptoms and metabolic derangements for which HD is indicated ?- Continue prednisone 60 mg daily for now  ?- HD today and tomorrow.  Then will plan to hold Thursday and do HD again on Friday.  ?- will restart eliquis ( was stopped prior to planned tunneled catheter) ?- she may have ultimately progressed to ESRD.  Follow response to HD and UOP  ?- on protonix for GI ppx ?- rituxan due later in May if continued  ?  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 ?# Heart failure with preserved EF  ?- volume status improved with lasix and on HD today  ?  ?# CAP  ?- abx per primary team  ?  ?# E coli Bacteremia ?- note e coli and enterobacterales in blood last check  ?- updated blood cultures - NGTD ?- abx per primary team  ?  ?# Metabolic  acidosis  ?- secondary to renal failure  ?- stop PO bicarb ?  ?# Hyperkalemia ?- improved from presentation  ?- off veltassa; starting HD ?  ?# HTN  ?- optimize volume with HD ?  ?# Anemia CKD  ?- Discuss ESA with patient  ?- check iron stores ? ?Dispo - continue inpatient monitoring ? ?Claudia Desanctis, MD ?06/26/2021 ? 12:12 PM ? ? ?Spoke with patient about risks/benefits/indications for ESA and she does agree to start ESA. Confirms no known hx of malignancy.  Start aranesp 40 mcg weekly on tuesdays ? ?Claudia Desanctis, MD ?3:34 PM ?06/26/2021 ? ?

## 2021-06-26 NOTE — Procedures (Signed)
Interventional Radiology Procedure Note ? ?Procedure: Placement of a right IJ approach triple lumen temp HD cath. Tip is positioned at the superior cavoatrial junction and catheter is ready for immediate use.  ? ?Complications: None ? ?Recommendations:  ?- OK to use ?- Do not submerge ?- Routine line care  ? ?Signed, ? ?Dulcy Fanny. Earleen Newport, DO ? ? ?

## 2021-06-26 NOTE — Progress Notes (Signed)
?                                  PROGRESS NOTE                                             ?                                                                                                                     ?                                         ? ? Patient Demographics:  ? ? Mackenzie [name], is a 71 y.o. female, DOB - 1950/06/10, [id number] ? ?Outpatient Primary MD for the patient is [name], MD    LOS - 2  Admit date - 06/24/2021   ? ?No chief complaint on file. ?    ? ?Brief Narrative (HPI from H&P)   71 y.o. female with HTN, CAD, DM 2, HFpEF, CKD 5 and atrial fibrillation, her daughter noticed that patient started having progressive fatigue, malaise and then became progressively more short of breath with mild cough.  She saw her PCP where she was diagnosed with UTI, she was subsequently admitted to [facility] hospital for sepsis, pneumonia, acute hypoxic respiratory failure, AKI on CKD 5, she was then transferred to [facility] for further care upon family request. ? ? Subjective:  ? ?Patient in bed, appears comfortable, denies any headache, no fever, no chest pain or pressure, improved shortness of breath , no abdominal pain. No new focal weakness. ? ? Assessment  & Plan :  ? ? ?Acute hypoxic respiratory failure due to acute on chronic diastolic CHF EF 89% on last echocardiogram 1 year ago along with community-acquired pneumonia with severe sepsis upon admission at [facility] -she is currently on IV antibiotics which will be continued, she clearly was also in fluid overload and received a dose of IV Lasix on 06/25/2021 by nephrology after which her respiratory failure is much improved and lung sounds are considerably better, continue to advance activity and titrate down supplemental oxygen, encouraged to sit in chair use I-S and flutter valve for pulmonary toiletry.  Overall this problem is much improved. ? 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  Renal ultrasound this admission nonacute, HD if needed per nephrology. ? ?E. coli bacteremia.  Likely source UTI which was diagnosed at PCP office.  Continue IV antibiotics.  Clinically sepsis pathophysiology has defervesced, repeat blood cultures thus far negative. ? ?Paroxysmal atrial fibrillation (HCC) with [place] vasc 2 score of 5.  Continue beta-blocker and Eliquis combination.  Eliquis held by nephrology. ? ?Acute on chronic diastolic CHF with EF 74% on echocardiogram in November 2022.  Kindly see #1 above. ? ?HTN (hypertension) - currently on beta-blocker, monitor with as needed hydralazine in addition to beta-blocker. ? ?Anemia of chronic disease.  Monitor. ? ?Hypomagnesemia.  Replaced. ? ?DM2 (diabetes mellitus, type 2) ([place]) - ISS ? ?Lab Results  ?Component Value Date  ? HGBA1C 5.8 (H) 01/15/2021  ? ? ?CBG (last 3)  ?Recent Labs  ?  06/25/21 ?1556 06/25/21 ?2043 06/26/21 ?0803  ?GLUCAP 174* 169* 114*  ? ? ?   ? ?Condition - Extremely Guarded ? ?Family Communication  :  family member bedside ? ?Code Status :  Full ? ?Consults  :  Renal ? ?PUD Prophylaxis :   ? ? Procedures  :    ? ?HD Temp Cath per IR due on 06/26/21 ? ?Renal US  - Non acute ? ?CT  -  Large bilateral airspace opacities are noted, right greater than left,  consistent with multifocal pneumonia. Coronary artery calcifications are noted. No acute abnormality seen in the abdomen or pelvis. Aortic Atherosclerosis ? ?   ? ?Disposition Plan  :   ? ?Status is: Inpatient ? ?DVT Prophylaxis  :   ? ?SCDs Start: 06/24/21 2213 ? ?  ? ?Lab Results  ?Component Value Date  ? PLT 108 (L) 06/26/2021  ? ? ?Diet :  ?Diet Order   ? ?       ?  Diet NPO time specified  Diet effective midnight       ?  ? ?  ?  ? ?  ?  ? ?Inpatient Medications ? ?Scheduled Meds: ? atorvastatin  40 mg Oral q1800  ? azithromycin  500 mg Oral Daily  ? Chlorhexidine Gluconate Cloth  6 each Topical Q0600  ? heparin sodium (porcine)      ? insulin aspart  0-6 Units Subcutaneous TID WC  ? lidocaine      ? metoprolol tartrate  12.5 mg Oral BID  ? pantoprazole  40 mg Oral Daily  ? predniSONE  60 mg Oral Daily  ? sodium bicarbonate  1,300 mg Oral BID  ? ?Continuous Infusions: ? sodium chloride    ? sodium chloride    ? ceFEPIme (MAXIPIME) 2 GM IVPB (Mini-Bag Plus) 2 g (06/25/21 2212)  ? ?PRN Meds:.sodium chloride, sodium chloride, acetaminophen **OR** acetaminophen, albuterol, alteplase, benzonatate, heparin, hydrALAZINE, lidocaine (PF), lidocaine-prilocaine, pentafluoroprop-tetrafluoroeth ? Time Spent in minutes  30 ? ? ?Lala Lund M.D on 06/26/2021 at 8:39 AM ? ?To page go to [email or web address]  ? ?[facility] -  Office  [phone] ? ?See all Orders from today for further details ? ? ? Objective:  ? ?Vitals:  ? 06/25/21 2200 06/26/21 0000 06/26/21 0300 06/26/21 0804  ?BP: 139/66 129/78 136/88 (!) 143/68  ?Pulse: 87 77 77 78  ?Resp: Marland Kitchen)  23 (!) 21 (!) 22 17  ?Temp:  98.5 ?F (36.9 ?C)  (!) 97.5 ?F (36.4 ?C)  ?TempSrc:  Oral  Oral  ?SpO2:  98% 99% 97%  ?Weight:      ?Height:      ? ? ?Wt Readings from Last 3 Encounters:  ?06/24/21 74.3 kg  ?06/22/21 72.6 kg  ?05/07/21 78.9 kg  ? ? ?No intake or output data in the 24 hours ending 06/26/21 0839 ? ? ? ?Physical Exam ? ?Awake Alert, No new F.N deficits, Normal  affect ?[place].AT,PERRAL ?Supple Neck, No JVD,   ?Symmetrical Chest wall movement, Good air movement bilaterally, CTAB ?RRR,No Gallops, Rubs or new Murmurs,  ?+ve B.Sounds, Abd Soft, No tenderness,   ?No Cyanosis, Clubbing or edema  ? ?   ? ? Data Review:  ? ? ?CBC ?Recent Labs  ?Lab 06/22/21 ?1240 06/23/21 ?6301 06/25/21 ?0120 06/26/21 ?0254  ?WBC 5.4 7.7 10.1 9.8  ?HGB 11.0* 8.6* 7.9* 8.1*  ?HCT 35.5* 27.1* 24.5* 25.1*  ?PLT 129* 114* 104* 108*  ?MCV 97.0 95.8 95.3 95.1  ?MCH 30.1 30.4 30.7 30.7  ?MCHC 31.0 31.7 32.2 32.3  ?RDW 14.6 14.7 15.0 15.1  ?LYMPHSABS 0.2*  --  0.2* 0.2*  ?MONOABS 0.2  --  0.2 0.2  ?EOSABS 0.0  --  0.0 0.0  ?BASOSABS 0.1  --  0.1 0.0  ? ? ?Electrolytes ?Recent Labs  ?Lab 06/22/21 ?1240 06/22/21 ?1440 06/23/21 ?6010 06/23/21 ?1729 06/24/21 ?9323 06/25/21 ?0120 06/25/21 ?5573 06/26/21 ?0254  ?NA 136  --  139  --  144 140  --  136  ?K 5.7*  --  5.7*  --  4.7 5.0  --  4.9  ?CL 112*  --  113*  --  114* 114*  --  110  ?CO2 10*  --  14*  --  15* 13*  --  12*  ?GLUCOSE 204*  --  147*  --  105* 146*  --  135*  ?BUN 104*  --  96*  --  100* 101*  --  106*  ?CREATININE 4.47*  --  4.19*  --  4.27* 4.82*  --  4.91*  ?CALCIUM 8.5*  --  7.5*  --  7.4* 7.4*  --  7.9*  ?AST 24  --  24  --   --  22  --  19  ?ALT 14  --  15  --   --  16  --  14  ?ALKPHOS 48  --  37*  --   --  55  --  57  ?BILITOT 0.8  --  0.8  --   --  1.0  --  0.8  ?ALBUMIN 3.0*  --  2.1*  --   --  2.0*  --  1.9*  ?MG  --   --   --   --   --  1.3*  --  2.0  ?CRP  --   --   --   --   --   --  22.8* 19.4*  ?PROCALCITON  --   --   --  76.05 80.02 64.87  --  40.53  ?LATICACIDVEN 4.4* 5.7*  --  1.2  --   --   --   --   ?INR 3.7*  --   --   --   --   --   --   --   ?BNP  --   --   --  224.3*  --  322.0*  --  350.2*  ? ? ?------------------------------------------------------------------------------------------------------------------ ?No results for input(s): CHOL, HDL, LDLCALC, TRIG, CHOLHDL, LDLDIRECT in the last 72 hours. ? ?Lab Results  ?Component  Value Date  ? HGBA1C 5.8 (H) 01/15/2021  ? ? ?No results for input(s): TSH, T4TOTAL, T3FREE, THYROIDAB in the last 72 hours. ? ?Invalid input(s): FREET3 ?-------------------------------------------------------

## 2021-06-26 NOTE — Procedures (Addendum)
Arrived to hemodialysis following temporary cath placement "vascath".  Cath insertion site is covered with gauze and is clean with no bleeding noted.  BP upon arrival was 138/73 and pt had no complaints.  Pt's first treatment went well with no distress noted throughout her treatment.  Vital signs remained stable, Pt's treatment was completed and pt was transferred to her original unit with a BP of 147/80. We were able to remove our goal of 500 mls as per order.  No medication given ?

## 2021-06-26 NOTE — Progress Notes (Signed)
Physical Therapy Treatment ?Patient Details ?Name: Mackenzie [name] ?MRN: [id number] ?DOB: 12-Sep-1950 ?Today's Date: 06/26/2021 ? ? ?History of Present Illness 71 y.o. female who is admitted to [facility] 06/24/2021 by way of transfer from [facility] (adm 06/22/21 for sepsis due to UTI and multifocal pneumonia) for evaluation and management of acute kidney injury superimposed on CKD 4. 5/08 pt deciding if she wants to start HD  PMH significant for stage IV CKD, paroxysmal atrial fibrillation chronically anticoagulated on Eliquis, chronic diastolic heart failure, hypertension, anemia of chronic kidney disease, type 2 diabetes mellitus ? ?  ?PT Comments  ? ? Patient required encouragement to participate (daughter helpful with this). Ambulated a farther distance today compared to 5/8, yet still required RW for safety. Pt's goal is to not need RW at home. Will continue to work towards goal.  ?   ?Recommendations for follow up therapy are one component of a multi-disciplinary discharge planning process, led by the attending physician.  Recommendations may be updated based on patient status, additional functional criteria and insurance authorization. ? ?Follow Up Recommendations ? No PT follow up ?  ?  ?Assistance Recommended at Discharge Intermittent Supervision/Assistance  ?Patient can return home with the following A little help with walking and/or transfers;Assistance with cooking/housework;Direct supervision/assist for medications management;Direct supervision/assist for financial management;Help with stairs or ramp for entrance ?  ?Equipment Recommendations ? Rolling walker (2 wheels) (pt currently refusing RW; will continue to assess)  ?  ?Recommendations for Other Services   ? ? ?  ?Precautions / Restrictions Precautions ?Precautions: Fall ?Restrictions ?Weight Bearing Restrictions: No  ?  ? ?Mobility ? Bed Mobility ?Overal bed mobility: Modified Independent ?  ?  ?  ?  ?  ?  ?General bed mobility comments: HOB elevated;  to EOB without difficulty,slightly incr time ?  ? ?Transfers ?Overall transfer level: Needs assistance ?Equipment used: Rolling walker (2 wheels) ?Transfers: Sit to/from Stand ?Sit to Stand: Min guard ?  ?  ?  ?  ?  ?General transfer comment: vc for proper hand placement/safety with RW ?  ? ?Ambulation/Gait ?Ambulation/Gait assistance: Min assist ?Gait Distance (Feet): 85 Feet ?Assistive device: Rolling walker (2 wheels) ?Gait Pattern/deviations: Step-through pattern, Decreased stride length, Trunk flexed ?  ?Gait velocity interpretation: 1.31 - 2.62 ft/sec, indicative of limited community ambulator ?  ?General Gait Details: required encouragement to walk out into hall and walk more than she did 5/8; daughter very encouraging; assist with maneuvering RW through narrow spaces ? ? ?Stairs ?  ?  ?  ?  ?  ? ? ?Wheelchair Mobility ?  ? ?Modified Rankin (Stroke Patients Only) ?  ? ? ?  ?Balance Overall balance assessment: Needs assistance ?Sitting-balance support: No upper extremity supported ?Sitting balance-Leahy Scale: Good ?  ?  ?Standing balance support: Bilateral upper extremity supported ?Standing balance-Leahy Scale: Poor ?  ?  ?  ?  ?  ?  ?  ?  ?  ?  ?  ?  ?  ? ?  ?Cognition Arousal/Alertness: Awake/alert ?Behavior During Therapy: [facility] for tasks assessed/performed ?Overall Cognitive Status: History of cognitive impairments - at baseline ?  ?  ?  ?  ?  ?  ?  ?  ?  ?  ?  ?  ?  ?  ?  ?  ?  ?  ?  ? ?  ?Exercises General Exercises - Lower Extremity ?Ankle Circles/Pumps: Both, 10 reps ?Long Arc Quad: AROM, Both, 15 reps ?Hip Flexion/Marching: Both, 10  reps ?Heel Raises: Both, 10 reps ? ?  ?General Comments General comments (skin integrity, edema, etc.): Daughter present throughout ?  ?  ? ?Pertinent Vitals/Pain Pain Assessment ?Pain Assessment: Faces ?Faces Pain Scale: Hurts a little bit ?Pain Location: neck at IJ site ?Pain Descriptors / Indicators: Discomfort ?Pain Intervention(s): Limited activity within patient's  tolerance, Monitored during session  ? ? ?Home Living   ?  ?  ?  ?  ?  ?  ?  ?  ?  ?   ?  ?Prior Function    ?  ?  ?   ? ?PT Goals (current goals can now be found in the care plan section) Acute Rehab PT Goals ?Patient Stated Goal: return home without needing a RW ?Time For Goal Achievement: 07/09/21 ?Potential to Achieve Goals: Good ?Progress towards PT goals: Progressing toward goals ? ?  ?Frequency ? ? ? Min 3X/week ? ? ? ?  ?PT Plan Current plan remains appropriate  ? ? ?Co-evaluation   ?  ?  ?  ?  ? ?  ?AM-PAC PT "6 Clicks" Mobility   ?Outcome Measure ? Help needed turning from your back to your side while in a flat bed without using bedrails?: None ?Help needed moving from lying on your back to sitting on the side of a flat bed without using bedrails?: None ?Help needed moving to and from a bed to a chair (including a wheelchair)?: A Little ?Help needed standing up from a chair using your arms (e.g., wheelchair or bedside chair)?: A Little ?Help needed to walk in hospital room?: A Little ?Help needed climbing 3-5 steps with a railing? : A Little ?6 Click Score: 20 ? ?  ?End of Session Equipment Utilized During Treatment: Gait belt ?Activity Tolerance: Patient tolerated treatment well ?Patient left: in chair;with call bell/phone within reach;with family/visitor present ?Nurse Communication: Mobility status;Other (comment) (walking O2 94%; seated 100%) ?PT Visit Diagnosis: Difficulty in walking, not elsewhere classified (R26.2) ?  ? ? ?Time: 5974-1638 ?PT Time Calculation (min) (ACUTE ONLY): 25 min ? ?Charges:  $Gait Training: 23-37 mins          ?          ? ? ?Arby Barrette, PT ?Acute Rehabilitation Services  ?Pager [phone] ?Office [phone] ? ? ? ?Jeanie Cooks Leata Dominy ?06/26/2021, 1:56 PM ? ?

## 2021-06-27 ENCOUNTER — Inpatient Hospital Stay (HOSPITAL_COMMUNITY): Payer: Medicare Other

## 2021-06-27 LAB — CBC WITH DIFFERENTIAL/PLATELET
Abs Immature Granulocytes: 1.36 10*3/uL — ABNORMAL HIGH (ref 0.00–0.07)
Basophils Absolute: 0 10*3/uL (ref 0.0–0.1)
Basophils Relative: 0 %
Eosinophils Absolute: 0 10*3/uL (ref 0.0–0.5)
Eosinophils Relative: 0 %
HCT: 23.9 % — ABNORMAL LOW (ref 36.0–46.0)
Hemoglobin: 7.9 g/dL — ABNORMAL LOW (ref 12.0–15.0)
Immature Granulocytes: 17 %
Lymphocytes Relative: 4 %
Lymphs Abs: 0.3 10*3/uL — ABNORMAL LOW (ref 0.7–4.0)
MCH: 31 pg (ref 26.0–34.0)
MCHC: 33.1 g/dL (ref 30.0–36.0)
MCV: 93.7 fL (ref 80.0–100.0)
Monocytes Absolute: 0.1 10*3/uL (ref 0.1–1.0)
Monocytes Relative: 1 %
Neutro Abs: 6.2 10*3/uL (ref 1.7–7.7)
Neutrophils Relative %: 78 %
Platelets: 121 10*3/uL — ABNORMAL LOW (ref 150–400)
RBC: 2.55 MIL/uL — ABNORMAL LOW (ref 3.87–5.11)
RDW: 14.6 % (ref 11.5–15.5)
WBC: 8.1 10*3/uL (ref 4.0–10.5)
nRBC: 0.2 % (ref 0.0–0.2)

## 2021-06-27 LAB — PROCALCITONIN: Procalcitonin: 32.79 ng/mL

## 2021-06-27 LAB — C-REACTIVE PROTEIN: CRP: 14.1 mg/dL — ABNORMAL HIGH (ref <1.0)

## 2021-06-27 LAB — GLUCOSE, CAPILLARY
Glucose-Capillary: 70 mg/dL (ref 70–99)
Glucose-Capillary: 86 mg/dL (ref 70–99)
Glucose-Capillary: 117 mg/dL — ABNORMAL HIGH (ref 70–99)
Glucose-Capillary: 157 mg/dL — ABNORMAL HIGH (ref 70–99)

## 2021-06-27 LAB — COMPREHENSIVE METABOLIC PANEL
ALT: 15 U/L (ref 0–44)
AST: 18 U/L (ref 15–41)
Albumin: 1.8 g/dL — ABNORMAL LOW (ref 3.5–5.0)
Alkaline Phosphatase: 53 U/L (ref 38–126)
Anion gap: 12 (ref 5–15)
BUN: 71 mg/dL — ABNORMAL HIGH (ref 8–23)
CO2: 18 mmol/L — ABNORMAL LOW (ref 22–32)
Calcium: 7.9 mg/dL — ABNORMAL LOW (ref 8.9–10.3)
Chloride: 105 mmol/L (ref 98–111)
Creatinine, Ser: 3.77 mg/dL — ABNORMAL HIGH (ref 0.44–1.00)
GFR, Estimated: 12 mL/min — ABNORMAL LOW (ref >60)
Glucose, Bld: 84 mg/dL (ref 70–99)
Potassium: 3.9 mmol/L (ref 3.5–5.1)
Sodium: 135 mmol/L (ref 135–145)
Total Bilirubin: 1.5 mg/dL — ABNORMAL HIGH (ref 0.3–1.2)
Total Protein: 5 g/dL — ABNORMAL LOW (ref 6.5–8.1)

## 2021-06-27 LAB — HEPATITIS B SURFACE ANTIBODY, QUANTITATIVE: Hep B S AB Quant (Post): <3.1 m[IU]/mL — ABNORMAL LOW (ref >9.9)

## 2021-06-27 LAB — MAGNESIUM: Magnesium: 1.7 mg/dL (ref 1.7–2.4)

## 2021-06-27 LAB — BRAIN NATRIURETIC PEPTIDE: B Natriuretic Peptide: 338.1 pg/mL — ABNORMAL HIGH (ref 0.0–100.0)

## 2021-06-27 MED ORDER — SODIUM CHLORIDE 0.9 % IV SOLN
250.0000 mg | Freq: Every day | INTRAVENOUS | Status: AC
  Administered 2021-06-27 – 2021-06-28 (×2): 250 mg via INTRAVENOUS
  Filled 2021-06-27 (×2): qty 20

## 2021-06-27 MED ORDER — FUROSEMIDE 10 MG/ML IJ SOLN
80.0000 mg | Freq: Once | INTRAMUSCULAR | Status: AC
  Administered 2021-06-27: 80 mg via INTRAVENOUS
  Filled 2021-06-27: qty 8

## 2021-06-27 MED ORDER — FUROSEMIDE 10 MG/ML IJ SOLN
60.0000 mg | Freq: Once | INTRAMUSCULAR | Status: DC
Start: 2021-06-27 — End: 2021-06-27

## 2021-06-27 NOTE — Progress Notes (Addendum)
Contacted by nephrologist regarding pt's need for out-pt HD at d/c. Attempted to reach pt and pt's daughter via phone (due to working remotely) this afternoon but was unsuccessful. Will f/u with pt/pt's daughter tomorrow.  ? ?Melven Sartorius ?Renal Navigator ?[phone] ? ?Addendum at 5:31 pm: ?Pt's daughter returned call to navigator this afternoon. Daughter thinks pt may be interested in a clinic in [place]. Advised daughter will investigate clinic options and f/u with pt and daughter tomorrow. Daughter agreeable to plan.  ?

## 2021-06-27 NOTE — Progress Notes (Addendum)
?[facility] ?Progress Note  ? ? 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 At this point unclear whether this represents AKI in the setting of her acute illness vs progression to ESRD. HD initiated yesterday, 5/9. Plan for HD again today. Continue prednisone 60mg  daily with protonix for GI ppx. ?HFpEF- unfortunately no UOP charted yesterday. 574mL removed with HD yesterday. Clinically appears euvolemic. ?CAP- antibiotics per primary ?E Coli Bacteremia- antibiotics per primary. Repeat cultures show NGTD ?Metabolic Acidosis- secondary to renal failure. Improved with initiation of HD. ?Hyperkalemia- Resolved w/HD. Off Veltassa. ?Anemia of CKD- Aranesp 64mcg weekly (qTuesday), first dose 5/9 ? ?Subjective:   ?No acute events overnight. Patient denies complaints this morning other than disliking the hospital food. Feels her breathing is significantly improved from prior.  ? ?Objective:   ?BP (!) 112/92 (BP Location: Left Arm)   Pulse 99   Temp 98.3 ?F (36.8 ?C) (Oral)   Resp 20   Ht 5\' 6"  (1.676 m)   Wt 77.6 kg   SpO2 96%   BMI 27.61 kg/m?  ? ?Intake/Output Summary (Last 24 hours) at 06/27/2021 0831 ?Last data filed at 06/26/2021 1210 ?Gross per 24 hour  ?Intake --  ?Output 500 ml  ?Net -500 ml  ? ?Weight change:  ? ?Physical Exam: ?Gen: alert, resting comfortably ?CVS: RRR, normal S1/S2 without m/r/g ?Resp: normal effort on 2L [place] ?Abd: soft, nontender ?Ext: no pedal edema ? ? ?Imaging: ?US RENAL ? ?Result Date: 06/25/2021 ?CLINICAL DATA:  Acute kidney injury. EXAM: RENAL / URINARY TRACT ULTRASOUND COMPLETE COMPARISON:  CT abdomen pelvis dated Jun 22, 2021. Renal ultrasound dated March 06, 2021. FINDINGS: Right Kidney: Renal measurements: 11.2 x 5.1 x 4.8 cm = volume: 144 mL. Unchanged increased echogenicity. No mass or hydronephrosis visualized. 1.0 cm simple cyst. No follow-up imaging is recommended. Left Kidney: Renal measurements: 10.6 x 5.9 x 5.7  cm = volume: 186 mL. Unchanged increased echogenicity. No mass or hydronephrosis visualized. 1.0 cm simple cysts. No follow-up imaging is recommended. Bladder: Appears normal for degree of bladder distention. Other: None. IMPRESSION: 1. No acute abnormality. 2. Unchanged increased bilateral renal echogenicity, consistent with medical renal disease. Electronically Signed   By: Titus Dubin M.D.   On: 06/25/2021 11:38  ? ?IR Fluoro Guide CV Line Right ? ?Result Date: 06/26/2021 ?INDICATION: 71 year old female referred for temporary hemodialysis catheter EXAM: IMAGE GUIDED PLACEMENT OF RIGHT IJ TEMPORARY HEMODIALYSIS CATHETER MEDICATIONS: None ANESTHESIA/SEDATION: None FLUOROSCOPY: Radiation Exposure Index (as provided by the fluoroscopic device): 10 mGy Kerma COMPLICATIONS: None PROCEDURE: Informed written consent was obtained from the patient's family after a discussion of the risks, benefits, and alternatives to treatment. Questions regarding the procedure were encouraged and answered. The right neck was prepped with chlorhexidine in a sterile fashion, and a sterile drape was applied covering the operative field. Maximum barrier sterile technique with sterile gowns and gloves were used for the procedure. A timeout was performed prior to the initiation of the procedure. A micropuncture kit was utilized to access the right internal jugular vein under direct, real-time ultrasound guidance after the overlying soft tissues were anesthetized with 1% lidocaine with epinephrine. Ultrasound image documentation was performed. The microwire was kinked to measure appropriate catheter length. A guidewire was advanced to the level of the IVC. A 16 cm hemodialysis catheter was then placed over the wire. Final catheter positioning was confirmed and documented with a spot radiographic image. The catheter aspirates and flushes normally. The  catheter was flushed with appropriate volume heparin dwells. Dressings were applied. The  patient tolerated the procedure well without immediate post procedural complication. IMPRESSION: Status post right IJ temporary hemodialysis catheter. Signed, Dulcy Fanny. Dellia Nims, RPVI [facility] Electronically Signed   By: Corrie Mckusick D.O.   On: 06/26/2021 16:30  ? ?IR US Guide Vasc Access Right ? ?Result Date: 06/26/2021 ?INDICATION: 71 year old female referred for temporary hemodialysis catheter EXAM: IMAGE GUIDED PLACEMENT OF RIGHT IJ TEMPORARY HEMODIALYSIS CATHETER MEDICATIONS: None ANESTHESIA/SEDATION: None FLUOROSCOPY: Radiation Exposure Index (as provided by the fluoroscopic device): 10 mGy Kerma COMPLICATIONS: None PROCEDURE: Informed written consent was obtained from the patient's family after a discussion of the risks, benefits, and alternatives to treatment. Questions regarding the procedure were encouraged and answered. The right neck was prepped with chlorhexidine in a sterile fashion, and a sterile drape was applied covering the operative field. Maximum barrier sterile technique with sterile gowns and gloves were used for the procedure. A timeout was performed prior to the initiation of the procedure. A micropuncture kit was utilized to access the right internal jugular vein under direct, real-time ultrasound guidance after the overlying soft tissues were anesthetized with 1% lidocaine with epinephrine. Ultrasound image documentation was performed. The microwire was kinked to measure appropriate catheter length. A guidewire was advanced to the level of the IVC. A 16 cm hemodialysis catheter was then placed over the wire. Final catheter positioning was confirmed and documented with a spot radiographic image. The catheter aspirates and flushes normally. The catheter was flushed with appropriate volume heparin dwells. Dressings were applied. The patient tolerated the procedure well without immediate post procedural complication. IMPRESSION:  Status post right IJ temporary hemodialysis catheter. Signed, Dulcy Fanny. Dellia Nims, RPVI [facility] Electronically Signed   By: Corrie Mckusick D.O.   On: 06/26/2021 16:30  ? ?DG Chest Port 1 View ? ?Result Date: 06/27/2021 ?CLINICAL DATA:  Shortness of breath. EXAM: PORTABLE CHEST 1 VIEW COMPARISON:  06/25/2021 FINDINGS: Stable cardiac enlargement. New right IJ dialysis catheter in good position without complicating features. Worsening bilateral pulmonary infiltrates most notably in the right upper lobe and right lower lobe. Associated right pleural effusion appears larger. No pneumothorax. IMPRESSION: 1. New right IJ dialysis catheter in good position. 2. Worsening bilateral pulmonary infiltrates and enlarging right pleural effusion. Electronically Signed   By: Marijo Sanes M.D.   On: 06/27/2021 07:29   ? ?Labs: ?BMET ?Recent Labs  ?Lab 06/22/21 ?1240 06/23/21 ?2952 06/24/21 ?0420 06/25/21 ?0120 06/26/21 ?0254 06/27/21 ?0139  ?NA 136 139 144 140 136 135  ?K 5.7* 5.7* 4.7 5.0 4.9 3.9  ?CL 112* 113* 114* 114* 110 105  ?CO2 10* 14* 15* 13* 12* 18*  ?GLUCOSE 204* 147* 105* 146* 135* 84  ?BUN 104* 96* 100* 101* 106* 71*  ?CREATININE 4.47* 4.19* 4.27* 4.82* 4.91* 3.77*  ?CALCIUM 8.5* 7.5* 7.4* 7.4* 7.9* 7.9*  ?PHOS  --   --   --  6.9*  --   --   ? ?CBC ?Recent Labs  ?Lab 06/22/21 ?1240 06/23/21 ?8413 06/25/21 ?0120 06/26/21 ?0254 06/27/21 ?0139  ?WBC 5.4 7.7 10.1 9.8 8.1  ?NEUTROABS 1.3*  --  8.5* 8.1* 6.2  ?HGB 11.0* 8.6* 7.9* 8.1* 7.9*  ?HCT 35.5* 27.1* 24.5* 25.1* 23.9*  ?MCV 97.0 95.8 95.3 95.1 93.7  ?PLT 129* 114* 104* 108* 121*  ? ? ?Medications:   ? ? apixaban  5 mg Oral BID  ?  atorvastatin  40 mg Oral q1800  ? Chlorhexidine Gluconate Cloth  6 each Topical Q0600  ? Chlorhexidine Gluconate Cloth  6 each Topical Q0600  ? darbepoetin (ARANESP) injection - NON-DIALYSIS  40 mcg Subcutaneous Q Tue-1800  ? insulin aspart  0-6 Units Subcutaneous TID WC  ?  metoprolol tartrate  12.5 mg Oral BID  ? pantoprazole  40 mg Oral Daily  ? predniSONE  60 mg Oral Daily  ? ? ? ?Alcus Dad, MD ?06/27/2021, 8:31 AM  ?PGY-2 [facility] ? ? ?Seen and examined independently.  Agree

## 2021-06-27 NOTE — Care Management Important Message (Signed)
Important Message ? ?Patient Details  ?Name: Mackenzie [name] ?MRN: [id number] ?Date of Birth: 1950/06/02 ? ? ?Medicare Important Message Given:  Yes ? ? ? ? ?Toivo Bordon ?06/27/2021, 2:56 PM ?

## 2021-06-27 NOTE — Progress Notes (Signed)
?                                  PROGRESS NOTE                                             ?                                                                                                                     ?                                         ? ? Patient Demographics:  ? ? Mackenzie [name], is a 71 y.o. female, DOB - 07-22-1950, WYO:[id number] ? ?Outpatient Primary MD for the patient is [name], MD    LOS - 3  Admit date - 06/24/2021   ? ?No chief complaint on file. ?    ? ?Brief Narrative (HPI from H&P)   71 y.o. female with HTN, CAD, DM 2, HFpEF, CKD 5 and atrial fibrillation, her daughter noticed that patient started having progressive fatigue, malaise and then became progressively more short of breath with mild cough.  She saw her PCP where she was diagnosed with UTI, she was subsequently admitted to [place] hospital for sepsis, pneumonia, acute hypoxic respiratory failure, AKI on CKD 5, she was then transferred to [facility] for further care upon family request. ? ? Subjective:  ? ?Patient in bed, appears comfortable, denies any headache, no fever, no chest pain or pressure, no shortness of breath , no abdominal pain. No new focal weakness. ? ? Assessment  & Plan :  ? ? ?Acute hypoxic respiratory failure due to acute on chronic diastolic CHF EF 77% on last echocardiogram 1 year ago along with community-acquired pneumonia with severe sepsis upon admission at [facility] -she is currently on IV antibiotics which will be continued, she clearly was also in fluid overload and received a dose of IV Lasix on 06/25/2021 by nephrology after which her respiratory failure is much improved and lung sounds are considerably better, continue to advance activity and titrate down supplemental oxygen, encouraged to sit in chair use I-S and flutter valve for pulmonary toiletry.  Overall this problem is much improved. ? 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 and associated with most recent prior serum creatinine data point of 3.47 on 05/07/2021, her presenting serum creatinine throughout [facility] hospitalization has been noted to be in the range of 4.27 - 4.47, she has received Rituxan in the past and high-dose  steroids, currently on 20 mg of prednisone daily, creatinine is slightly elevated than her baseline, at this time we will continue her oral bicarb, Veltassa, clinically was in fluid overload and responded very well to IV Lasix on 06/25/2021, will repeat Lasix on 06/27/2021 as she is still slightly fluid overloaded, temporary dialysis catheter placed by IR on 06/26/2021 and HD resumed on 06/26/2021.  Patient and family counseled on fluid restriction in detail on 06/27/2021. ? ? ?E. coli bacteremia.  Likely source UTI which was diagnosed at PCP office.  Continue IV antibiotics.  Clinically sepsis pathophysiology has defervesced, repeat blood cultures thus far negative. ? ?Paroxysmal atrial fibrillation (HCC) with [place] vasc 2 score of 5.  Continue beta-blocker and Eliquis combination.   ? ?Acute on chronic diastolic CHF with EF 79% on echocardiogram in November 2022.  Kindly see #1 above. ? ?HTN (hypertension) - currently on beta-blocker, monitor with as needed hydralazine in addition to beta-blocker. ? ?Anemia of chronic disease.  Monitor. ? ?Hypomagnesemia.  Replaced. ? ?DM2 (diabetes mellitus, type 2) ([place]) - ISS ? ?Lab Results  ?Component Value Date  ? HGBA1C 5.8 (H) 01/15/2021  ? ? ?CBG (last 3)  ?Recent Labs  ?  06/26/21 ?1600 06/26/21 ?2154 06/27/21 ?0742  ?GLUCAP 99 90 70  ? ? ?   ? ?Condition - Extremely Guarded ? ?Family Communication  :  family member bedside ? ?Code Status :  Full ? ?Consults  :  Renal ? ?PUD Prophylaxis :   ? ? Procedures  :    ? ?Right IJ HD Temp Cath placed by IR on 06/26/2021 ? ?Renal US  - Non  acute ? ?CT  -  Large bilateral airspace opacities are noted, right greater than left, consistent with multifocal pneumonia. Coronary artery calcifications are noted. No acute abnormality seen in the abdomen or pelvis. Aortic Atherosclerosis ? ?   ? ?Disposition Plan  :   ? ?Status is: Inpatient ? ?DVT Prophylaxis  :   ? ?SCDs Start: 06/24/21 2213 ?apixaban (ELIQUIS) tablet 5 mg  ?  ? ?Lab Results  ?Component Value Date  ? PLT 121 (L) 06/27/2021  ? ? ?Diet :  ?Diet Order   ? ?       ?  Diet renal with fluid restriction Fluid restriction: 1200 mL Fluid; Room service appropriate? Yes; Fluid consistency: Thin  Diet effective now       ?  ? ?  ?  ? ?  ?  ? ?Inpatient Medications ? ?Scheduled Meds: ? apixaban  5 mg Oral BID  ? atorvastatin  40 mg Oral q1800  ? Chlorhexidine Gluconate Cloth  6 each Topical Q0600  ? Chlorhexidine Gluconate Cloth  6 each Topical Q0600  ? darbepoetin (ARANESP) injection - NON-DIALYSIS  40 mcg Subcutaneous Q Tue-1800  ? furosemide  60 mg Intravenous Once  ? insulin aspart  0-6 Units Subcutaneous TID WC  ? metoprolol tartrate  12.5 mg Oral BID  ? pantoprazole  40 mg Oral Daily  ? predniSONE  60 mg Oral Daily  ? ?Continuous Infusions: ?  ceFAZolin (ANCEF) IV 2 g (06/26/21 2105)  ? ?PRN Meds:.acetaminophen **OR** acetaminophen, albuterol, benzonatate, hydrALAZINE ? Time Spent in minutes  30 ? ? ?Lala Lund M.D on 06/27/2021 at 8:39 AM ? ?To page go to [email or web address]  ? ?[facility] -  Office  [phone] ? ?See all Orders from today for further details ? ? ? Objective:  ? ?Vitals:  ? 06/26/21 2327 06/27/21 0215 06/27/21 0308  06/27/21 0700  ?BP: 129/64  138/63 (!) 112/92  ?Pulse: 80  82 99  ?Resp: '20  19 20  '$ ?Temp: 98.5 ?F (36.9 ?C)  98.4 ?F (36.9 ?C) 98.3 ?F (36.8 ?C)  ?TempSrc: Oral  Oral Oral  ?SpO2: 94%  95% 96%  ?Weight:  77.6 kg    ?Height:      ? ? ?Wt Readings from Last 3 Encounters:  ?06/27/21 77.6 kg  ?06/22/21 72.6 kg  ?05/07/21 78.9 kg  ? ? ? ?Intake/Output Summary (Last 24  hours) at 06/27/2021 0839 ?Last data filed at 06/26/2021 1210 ?Gross per 24 hour  ?Intake --  ?Output 500 ml  ?Net -500 ml  ? ? ? ? ?Physical Exam ? ?Awake Alert, No new F.N deficits, Normal affect ?[place].AT, some conjunctival edema ?Supple Neck, No JVD, right IJ temporary dialysis catheter in place ?Symmetrical Chest wall movement, Good air movement bilaterally, CTAB ?RRR,No Gallops, Rubs or new Murmurs,  ?+ve B.Sounds, Abd Soft, No tenderness,   ?No Cyanosis, Clubbing or edema  ?   ? ? Data Review:  ? ? ?CBC ?Recent Labs  ?Lab 06/22/21 ?1240 06/23/21 ?2831 06/25/21 ?0120 06/26/21 ?0254 06/27/21 ?0139  ?WBC 5.4 7.7 10.1 9.8 8.1  ?HGB 11.0* 8.6* 7.9* 8.1* 7.9*  ?HCT 35.5* 27.1* 24.5* 25.1* 23.9*  ?PLT 129* 114* 104* 108* 121*  ?MCV 97.0 95.8 95.3 95.1 93.7  ?MCH 30.1 30.4 30.7 30.7 31.0  ?MCHC 31.0 31.7 32.2 32.3 33.1  ?RDW 14.6 14.7 15.0 15.1 14.6  ?LYMPHSABS 0.2*  --  0.2* 0.2* 0.3*  ?MONOABS 0.2  --  0.2 0.2 0.1  ?EOSABS 0.0  --  0.0 0.0 0.0  ?BASOSABS 0.1  --  0.1 0.0 0.0  ? ? ?Electrolytes ?Recent Labs  ?Lab 06/22/21 ?1240 06/22/21 ?1440 06/23/21 ?5176 06/23/21 ?1729 06/24/21 ?1607 06/25/21 ?0120 06/25/21 ?3710 06/26/21 ?6269 06/27/21 ?0139  ?NA 136  --  139  --  144 140  --  136 135  ?K 5.7*  --  5.7*  --  4.7 5.0  --  4.9 3.9  ?CL 112*  --  113*  --  114* 114*  --  110 105  ?CO2 10*  --  14*  --  15* 13*  --  12* 18*  ?GLUCOSE 204*  --  147*  --  105* 146*  --  135* 84  ?BUN 104*  --  96*  --  100* 101*  --  106* 71*  ?CREATININE 4.47*  --  4.19*  --  4.27* 4.82*  --  4.91* 3.77*  ?CALCIUM 8.5*  --  7.5*  --  7.4* 7.4*  --  7.9* 7.9*  ?AST 24  --  24  --   --  22  --  19 18  ?ALT 14  --  15  --   --  16  --  14 15  ?ALKPHOS 48  --  37*  --   --  55  --  57 53  ?BILITOT 0.8  --  0.8  --   --  1.0  --  0.8 1.5*  ?ALBUMIN 3.0*  --  2.1*  --   --  2.0*  --  1.9* 1.8*  ?MG  --   --   --   --   --  1.3*  --  2.0 1.7  ?CRP  --   --   --   --   --   --  22.8* 19.4* 14.1*  ?PROCALCITON  --   --   --  76.05 80.02 64.87  --   40.53 32.79  ?LATICACIDVEN 4.4* 5.7*  --  1.2  --   --   --   --   --   ?INR 3.7*  --   --   --   --   --   --   --   --   ?BNP  --   --   --  224.3*  --  322.0*  --  350.2* 338.1*  ? ? ?--------------

## 2021-06-27 NOTE — Procedures (Signed)
Seen and examined on dialysis.  Blood pressure 164/82 and HR 94.  Tolerating goal.  RIJ nontunneled catheter in use  ? ?Full note to follow ? ?Claudia Desanctis, MD ?06/27/2021  9:58 AM ? ? ?

## 2021-06-28 LAB — COMPREHENSIVE METABOLIC PANEL
ALT: 11 U/L (ref 0–44)
AST: 15 U/L (ref 15–41)
Albumin: 1.8 g/dL — ABNORMAL LOW (ref 3.5–5.0)
Alkaline Phosphatase: 53 U/L (ref 38–126)
Anion gap: 12 (ref 5–15)
BUN: 51 mg/dL — ABNORMAL HIGH (ref 8–23)
CO2: 20 mmol/L — ABNORMAL LOW (ref 22–32)
Calcium: 7.6 mg/dL — ABNORMAL LOW (ref 8.9–10.3)
Chloride: 102 mmol/L (ref 98–111)
Creatinine, Ser: 3.25 mg/dL — ABNORMAL HIGH (ref 0.44–1.00)
GFR, Estimated: 15 mL/min — ABNORMAL LOW (ref >60)
Glucose, Bld: 163 mg/dL — ABNORMAL HIGH (ref 70–99)
Potassium: 3.7 mmol/L (ref 3.5–5.1)
Sodium: 134 mmol/L — ABNORMAL LOW (ref 135–145)
Total Bilirubin: 1 mg/dL (ref 0.3–1.2)
Total Protein: 4.9 g/dL — ABNORMAL LOW (ref 6.5–8.1)

## 2021-06-28 LAB — GLUCOSE, CAPILLARY
Glucose-Capillary: 170 mg/dL — ABNORMAL HIGH (ref 70–99)
Glucose-Capillary: 137 mg/dL — ABNORMAL HIGH (ref 70–99)
Glucose-Capillary: 216 mg/dL — ABNORMAL HIGH (ref 70–99)
Glucose-Capillary: 204 mg/dL — ABNORMAL HIGH (ref 70–99)

## 2021-06-28 LAB — CBC WITH DIFFERENTIAL/PLATELET
Abs Immature Granulocytes: 0.69 10*3/uL — ABNORMAL HIGH (ref 0.00–0.07)
Basophils Absolute: 0 10*3/uL (ref 0.0–0.1)
Basophils Relative: 0 %
Eosinophils Absolute: 0 10*3/uL (ref 0.0–0.5)
Eosinophils Relative: 0 %
HCT: 24.1 % — ABNORMAL LOW (ref 36.0–46.0)
Hemoglobin: 7.7 g/dL — ABNORMAL LOW (ref 12.0–15.0)
Immature Granulocytes: 8 %
Lymphocytes Relative: 4 %
Lymphs Abs: 0.3 10*3/uL — ABNORMAL LOW (ref 0.7–4.0)
MCH: 30.6 pg (ref 26.0–34.0)
MCHC: 32 g/dL (ref 30.0–36.0)
MCV: 95.6 fL (ref 80.0–100.0)
Monocytes Absolute: 0.1 10*3/uL (ref 0.1–1.0)
Monocytes Relative: 2 %
Neutro Abs: 7.4 10*3/uL (ref 1.7–7.7)
Neutrophils Relative %: 86 %
Platelets: 158 10*3/uL (ref 150–400)
RBC: 2.52 MIL/uL — ABNORMAL LOW (ref 3.87–5.11)
RDW: 14.3 % (ref 11.5–15.5)
WBC: 8.6 10*3/uL (ref 4.0–10.5)
nRBC: 0 % (ref 0.0–0.2)

## 2021-06-28 LAB — PROCALCITONIN: Procalcitonin: 22.44 ng/mL

## 2021-06-28 LAB — BRAIN NATRIURETIC PEPTIDE: B Natriuretic Peptide: 247.8 pg/mL — ABNORMAL HIGH (ref 0.0–100.0)

## 2021-06-28 LAB — C-REACTIVE PROTEIN: CRP: 18.8 mg/dL — ABNORMAL HIGH (ref <1.0)

## 2021-06-28 LAB — MAGNESIUM: Magnesium: 1.7 mg/dL (ref 1.7–2.4)

## 2021-06-28 MED ORDER — FUROSEMIDE 10 MG/ML IJ SOLN
80.0000 mg | Freq: Once | INTRAMUSCULAR | Status: AC
  Administered 2021-06-28: 80 mg via INTRAVENOUS
  Filled 2021-06-28: qty 8

## 2021-06-28 MED ORDER — CHLORHEXIDINE GLUCONATE CLOTH 2 % EX PADS
6.0000 | MEDICATED_PAD | Freq: Every day | CUTANEOUS | Status: DC
  Administered 2021-06-29: 6 via TOPICAL

## 2021-06-28 NOTE — Plan of Care (Signed)
  Problem: Clinical Measurements: Goal: Diagnostic test results will improve Outcome: Progressing   Problem: Activity: Goal: Risk for activity intolerance will decrease Outcome: Progressing   

## 2021-06-28 NOTE — Progress Notes (Signed)
Physical Therapy Treatment ?Patient Details ?Name: Mackenzie [name] ?MRN: [id number] ?DOB: December 28, 1950 ?Today's Date: 06/28/2021 ? ? ?History of Present Illness 71 y.o. female who is admitted to [name] 06/24/2021 by way of transfer from [facility] (adm 06/22/21 for sepsis due to UTI and multifocal pneumonia) for evaluation and management of acute kidney injury superimposed on CKD 4. 5/08 pt deciding if she wants to start HD  PMH significant for stage IV CKD, paroxysmal atrial fibrillation chronically anticoagulated on Eliquis, chronic diastolic heart failure, hypertension, anemia of chronic kidney disease, type 2 diabetes mellitus ? ?  ?PT Comments  ? ? Patient continues to require RW for ambulation. Offered for her to attempt ambulation without RW (as pt has said she does not want to use one upon discharge). Pt stood and immediately seeking UE support and asking for RW. Required cues for safe,proper use of RW with transfers and gait. Able to ambulate on room air with sats 93%.  ?   ?Recommendations for follow up therapy are one component of a multi-disciplinary discharge planning process, led by the attending physician.  Recommendations may be updated based on patient status, additional functional criteria and insurance authorization. ? ?Follow Up Recommendations ? No PT follow up ?  ?  ?Assistance Recommended at Discharge Intermittent Supervision/Assistance  ?Patient can return home with the following A little help with walking and/or transfers;Assistance with cooking/housework;Direct supervision/assist for medications management;Direct supervision/assist for financial management;Help with stairs or ramp for entrance ?  ?Equipment Recommendations ? Rolling walker (2 wheels)  ?  ?Recommendations for Other Services   ? ? ?  ?Precautions / Restrictions Precautions ?Precautions: Fall ?Restrictions ?Weight Bearing Restrictions: No  ?  ? ?Mobility ? Bed Mobility ?Overal bed mobility: Modified Independent ?  ?  ?  ?  ?  ?   ?General bed mobility comments: to EOB without difficulty,slightly incr time ?  ? ?Transfers ?Overall transfer level: Needs assistance ?Equipment used: Rolling walker (2 wheels) ?Transfers: Sit to/from Stand ?Sit to Stand: Supervision ?  ?  ?  ?  ?  ?General transfer comment: vc for proper hand placement/safety with RW x 4 reps ?  ? ?Ambulation/Gait ?Ambulation/Gait assistance: Min guard ?Gait Distance (Feet): 130 Feet ?Assistive device: Rolling walker (2 wheels) ?Gait Pattern/deviations: Step-through pattern, Decreased stride length, Trunk flexed ?  ?Gait velocity interpretation: 1.31 - 2.62 ft/sec, indicative of limited community ambulator ?  ?General Gait Details: vc for proximity to RW and upright posture; attempted to get pt to walk without RW, but she did not feel confident to do so ? ? ?Stairs ?  ?  ?  ?  ?  ? ? ?Wheelchair Mobility ?  ? ?Modified Rankin (Stroke Patients Only) ?  ? ? ?  ?Balance Overall balance assessment: Needs assistance ?Sitting-balance support: No upper extremity supported ?Sitting balance-Leahy Scale: Good ?  ?  ?Standing balance support: Bilateral upper extremity supported ?Standing balance-Leahy Scale: Poor ?Standing balance comment: stood from bed without RW and immediately seeking UE support ?  ?  ?  ?  ?  ?  ?  ?  ?  ?  ?  ?  ? ?  ?Cognition Arousal/Alertness: Awake/alert ?Behavior During Therapy: Mackenzie [name] for tasks assessed/performed ?Overall Cognitive Status: History of cognitive impairments - at baseline ?  ?  ?  ?  ?  ?  ?  ?  ?  ?  ?  ?  ?  ?  ?  ?  ?  ?  ?  ? ?  ?  Exercises   ? ?  ?General Comments General comments (skin integrity, edema, etc.): on 2L with sats 97%; removed O2 and sats remained 93% during session; RN okayed to stay on RA ?  ?  ? ?Pertinent Vitals/Pain Pain Assessment ?Pain Assessment: No/denies pain  ? ? ?Home Living Family/patient expects to be discharged to:: Private residence ?Living Arrangements: Children ?Available Help at Discharge: Family;Available 24  hours/day ?Type of Home: House ?Home Access: Stairs to enter ?Entrance Stairs-Rails: None ?Entrance Stairs-Number of Steps: 2 ?  ?Home Layout: One level ?Home Equipment: None ?   ?  ?Prior Function    ?  ?  ?   ? ?PT Goals (current goals can now be found in the care plan section) Acute Rehab PT Goals ?Patient Stated Goal: return home without needing a RW ?PT Goal Formulation: With patient ?Time For Goal Achievement: 07/09/21 ?Potential to Achieve Goals: Good ?Progress towards PT goals: Progressing toward goals ? ?  ?Frequency ? ? ? Min 3X/week ? ? ? ?  ?PT Plan Current plan remains appropriate  ? ? ?Co-evaluation   ?  ?  ?  ?  ? ?  ?AM-PAC PT "6 Clicks" Mobility   ?Outcome Measure ? Help needed turning from your back to your side while in a flat bed without using bedrails?: None ?Help needed moving from lying on your back to sitting on the side of a flat bed without using bedrails?: None ?Help needed moving to and from a bed to a chair (including a wheelchair)?: A Little ?Help needed standing up from a chair using your arms (e.g., wheelchair or bedside chair)?: A Little ?Help needed to walk in hospital room?: A Little ?Help needed climbing 3-5 steps with a railing? : A Little ?6 Click Score: 20 ? ?  ?End of Session Equipment Utilized During Treatment: Gait belt ?Activity Tolerance: Patient tolerated treatment well ?Patient left: in chair;with call bell/phone within reach;with chair alarm set ?Nurse Communication: Mobility status;Other (comment) (walking O2 93%; had small bowel movment) ?PT Visit Diagnosis: Difficulty in walking, not elsewhere classified (R26.2) ?  ? ? ?Time: 0263-7858 ?PT Time Calculation (min) (ACUTE ONLY): 30 min ? ?Charges:  $Gait Training: 23-37 mins          ?          ? ? ?Mackenzie [name], PT ?Acute Rehabilitation Services  ?Pager [phone] ?Office [phone] ? ? ? ?Mackenzie [name] Mackenzie [name] ?06/28/2021, 11:13 AM ? ?

## 2021-06-28 NOTE — Progress Notes (Signed)
?                                  PROGRESS NOTE                                             ?                                                                                                                     ?                                         ? ? Patient Demographics:  ? ? Mackenzie [name], is a 71 y.o. female, DOB - 22-Jun-1950, WUJ:[id number] ? ?Outpatient Primary MD for the patient is [name], MD    LOS - 4  Admit date - 06/24/2021   ? ?No chief complaint on file. ?    ? ?Brief Narrative (HPI from H&P)   71 y.o. female with HTN, CAD, DM 2, HFpEF, CKD 5 and atrial fibrillation, her daughter noticed that patient started having progressive fatigue, malaise and then became progressively more short of breath with mild cough.  She saw her PCP where she was diagnosed with UTI, she was subsequently admitted to [facility] hospital for sepsis, pneumonia, acute hypoxic respiratory failure, AKI on CKD 5, she was then transferred to [facility] for further care upon family request. ? ? Subjective:  ? ?Patient in bed, appears comfortable, denies any headache, no fever, no chest pain or pressure, no shortness of breath , no abdominal pain. No focal weakness. ? ? Assessment  & Plan :  ? ? ?Acute hypoxic respiratory failure due to acute on chronic diastolic CHF EF 95% on last echocardiogram 1 year ago along with community-acquired pneumonia with severe sepsis upon admission at [facility] -she is currently on IV antibiotics which will be continued, she clearly was also in fluid overload and received a dose of IV Lasix on 06/25/2021 by nephrology after which her respiratory failure is much improved and lung sounds are considerably better, continue to advance activity and titrate down supplemental oxygen, encouraged to sit in chair use I-S and flutter valve for pulmonary toiletry.  Overall this problem is much improved. ? 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 ? ?E. coli bacteremia.  Likely source UTI which was diagnosed at PCP office.  Continue IV antibiotics - cefazolin will complete total of 10 days.  Clinically sepsis pathophysiology has defervesced, repeat blood cultures drawn 06/26/2021 thus far negative on 06/28/2021. ? ?Paroxysmal atrial fibrillation (HCC) with [place] vasc 2 score of 5.  Continue beta-blocker and Eliquis combination.   ? ?Acute on chronic diastolic CHF with EF 44% on echocardiogram in November 2022.  Kindly see #1 above. ? ?HTN (hypertension) - currently on beta-blocker, monitor with as needed hydralazine in addition to beta-blocker. ? ?Anemia of chronic disease.  Monitor. ? ?Hypomagnesemia.  Replaced. ? ?DM2 (diabetes mellitus, type 2) ([place]) - ISS ? ?Lab Results  ?Component Value Date  ? HGBA1C 5.8 (H) 01/15/2021  ? ? ?CBG (last 3)  ?Recent Labs  ?  06/27/21 ?1655 06/27/21 ?2106 06/28/21 ?0900  ?GLUCAP 117* 157* 170*  ? ? ?   ? ?Condition - Extremely Guarded ? ?Family Communication  : Daughter bedside on 06/26/2021, 06/27/2021, 06/28/2021, daughter April over the phone on 06/26/2021 ? ?Code Status :  Full ? ?Consults  :  Renal ? ?PUD Prophylaxis :   ? ? Procedures  :    ? ?Right IJ HD Temp Cath placed by IR on 06/26/2021 ? ?Renal US  - Non acute ? ?CT  -  Large  bilateral airspace opacities are noted, right greater than left, consistent with multifocal pneumonia. Coronary artery calcifications are noted. No acute abnormality seen in the abdomen or pelvis. Aortic Atherosclerosis ? ?   ? ?Disposition Plan  :   ? ?Status is: Inpatient ? ?DVT Prophylaxis  :   ? ?SCDs Start: 06/24/21 2213 ? ?  ? ?Lab Results  ?Component Value Date  ? PLT 158 06/28/2021  ? ? ?Diet :  ?Diet Order   ? ?       ?  Diet NPO time specified  Diet effective midnight       ?  ?  Diet NPO time specified  Diet effective midnight       ?  ?  Diet renal with fluid restriction Fluid restriction: 1200 mL Fluid; Room service appropriate? Yes; Fluid consistency: Thin  Diet effective now       ?  ? ?  ?  ? ?  ?  ? ?Inpatient Medications ? ?Scheduled Meds: ? atorvastatin  40 mg Oral q1800  ? Chlorhexidine Gluconate Cloth  6 each Topical Q0600  ? Chlorhexidine Gluconate Cloth  6 each Topical Q0600  ? darbepoetin (ARANESP) injection - NON-DIALYSIS  40 mcg Subcutaneous Q Tue-1800  ? insulin aspart  0-6 Units Subcutaneous TID WC  ? metoprolol tartrate  12.5 mg Oral BID  ? pantoprazole  40 mg Oral Daily  ? predniSONE  60 mg Oral Daily  ? ?Continuous Infusions: ?  ceFAZolin (ANCEF) IV 2 g (06/27/21 2241)  ? ferric gluconate (FERRLECIT) IVPB 250 mg (06/28/21 1009)  ? ?PRN Meds:.acetaminophen **OR** acetaminophen, albuterol, benzonatate, hydrALAZINE ? Time Spent in minutes  30 ? ? ?Lala Lund M.D on 06/28/2021 at 11:00 AM ? ?To page go to [email or web address]  ? ?[facility] -  Office  [phone] ? ?See all Orders from today for further details ? ? ?  Objective:  ? ?Vitals:  ? 06/27/21 2342 06/28/21 0302 06/28/21 0500 06/28/21 0800  ?BP: (!) 146/79 (!) 146/74  135/69  ?Pulse: 82 81  79  ?Resp: '20 18  16  '$ ?Temp: 98.7 ?F (37.1 ?C) 98.6 ?F (37 ?C)  97.9 ?F (36.6 ?C)  ?TempSrc: Oral Oral  Oral  ?SpO2: 97% 97%  97%  ?Weight:   73.6 kg   ?Height:      ? ? ?Wt Readings from Last 3 Encounters:  ?06/28/21 73.6 kg  ?06/22/21  72.6 kg  ?05/07/21 78.9 kg  ? ? ? ?Intake/Output Summary (Last 24 hours) at 06/28/2021 1100 ?Last data filed at 06/27/2021 1958 ?Gross per 24 hour  ?Intake 507 ml  ?Output 1700 ml  ?Net -1193 ml  ? ? ? ? ?Physical Exam ? ?Awake Alert, No new F.N deficits, Normal affect ?[place].AT, does have some conjunctival edema, few crackles,  right IJ temporary dialysis catheter ?Supple Neck, No JVD,   ?Symmetrical Chest wall movement, Good air movement bilaterally, rales bilaterally ?RRR,No Gallops, Rubs or new Murmurs,  ?+ve B.Sounds, Abd Soft, No tenderness,   ?No Cyanosis, Clubbing or edema  ?   ? ? Data Review:  ? ? ?CBC ?Recent Labs  ?Lab 06/22/21 ?1240 06/23/21 ?8588 06/25/21 ?0120 06/26/21 ?0254 06/27/21 ?0139 06/28/21 ?0133  ?WBC 5.4 7.7 10.1 9.8 8.1 8.6  ?HGB 11.0* 8.6* 7.9* 8.1* 7.9* 7.7*  ?HCT 35.5* 27.1* 24.5* 25.1* 23.9* 24.1*  ?PLT 129* 114* 104* 108* 121* 158  ?MCV 97.0 95.8 95.3 95.1 93.7 95.6  ?MCH 30.1 30.4 30.7 30.7 31.0 30.6  ?MCHC 31.0 31.7 32.2 32.3 33.1 32.0  ?RDW 14.6 14.7 15.0 15.1 14.6 14.3  ?LYMPHSABS 0.2*  --  0.2* 0.2* 0.3* 0.3*  ?MONOABS 0.2  --  0.2 0.2 0.1 0.1  ?EOSABS 0.0  --  0.0 0.0 0.0 0.0  ?BASOSABS 0.1  --  0.1 0.0 0.0 0.0  ? ? ?Electrolytes ?Recent Labs  ?Lab 06/22/21 ?1240 06/22/21 ?1440 06/23/21 ?5027 06/23/21 ?1729 06/23/21 ?1729 06/24/21 ?7412 06/25/21 ?0120 06/25/21 ?8786 06/26/21 ?7672 06/27/21 ?0139 06/28/21 ?0133  ?NA 136  --  139  --   --  144 140  --  136 135 134*  ?K 5.7*  --  5.7*  --   --  4.7 5.0  --  4.9 3.9 3.7  ?CL 112*  --  113*  --   --  114* 114*  --  110 105 102  ?CO2 10*  --  14*  --   --  15* 13*  --  12* 18* 20*  ?GLUCOSE 204*  --  147*  --   --  105* 146*  --  135* 84 163*  ?BUN 104*  --  96*  --   --  100* 101*  --  106* 71* 51*  ?CREATININE 4.47*  --  4.19*  --   --  4.27* 4.82*  --  4.91* 3.77* 3.25*  ?CALCIUM 8.5*  --  7.5*  --   --  7.4* 7.4*  --  7.9* 7.9* 7.6*  ?AST 24  --  24  --   --   --  22  --  '19 18 15  '$ ?ALT 14  --  15  --   --   --  16  --  '14 15 11   '$ ?ALKPHOS 48  --  37*  --   --   --  55  --  57 53 53  ?BILITOT 0.8  --  0.8  --   --   --  1.0  --  0.8 1.5* 1.0  ?ALBUMIN 3.0*  --  2.1*  --   --   --  2.0*  --  1.9* 1.8* 1.8*  ?MG  --   --   --   --   --   --  1.

## 2021-06-28 NOTE — Progress Notes (Addendum)
?[facility] ?Progress Note  ? ? 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 At this point unclear whether this represents AKI in the setting of her acute illness vs progression to ESRD. Felt to be more likely the latter. Now oliguric (200cc UOP yesterday per chart, unclear how accurate this is). HD initiated 5/9 due to electrolyte derangements and uremia. Last HD session 5/10.  ?-Plan to hold HD today then do HD tomorrow 5/12 ?-Continue prednisone 60mg  daily for now ?-Protonix for GI ppx ?-Due for Rituxan end of May if still indicated at that time ?-Have initiated process of arranging outpatient HD ?-Hold Eliquis, NPO @ [facility] for placement of tunneled HD cath tomorrow ?HFpEF- Clinically appears euvolemic. Received Lasix 80mg  IV yesterday. ?CAP- Per primary. On abx. Much improved from a respiratory standpoint.  ?E Coli Bacteremia- antibiotics per primary. Repeat cultures show NGTD (x3 days) ?Metabolic Acidosis- secondary to renal failure. Improved with initiation of HD. Previously on PO bicarb, which has been d/c'd. ?Anemia of CKD- Aranesp 32mcg weekly (qTuesday), first dose 5/9 ? ?Subjective:   ?No acute events overnight. Patient denies complaints. States she feels much better overall.  ? ?Objective:   ?BP 135/69 (BP Location: Right Arm)   Pulse 79   Temp 98.6 ?F (37 ?C) (Oral)   Resp 16   Ht 5\' 6"  (1.676 m)   Wt 73.6 kg   SpO2 97%   BMI 26.19 kg/m?  ? ?Intake/Output Summary (Last 24 hours) at 06/28/2021 0821 ?Last data filed at 06/27/2021 1958 ?Gross per 24 hour  ?Intake 507 ml  ?Output 1700 ml  ?Net -1193 ml  ? ?Weight change: -2.5 kg ? ?Physical Exam: ?Gen: alert, resting comfortably ?CVS: RRR, normal S1/S2 without m/r/g ?Resp: normal effort on 2L [place] ?Abd: soft, nontender ?Ext: no pedal edema ? ? ?Imaging: ?IR Fluoro Guide CV Line Right ? ?Result Date: 06/26/2021 ?INDICATION: 71 year old female referred for temporary hemodialysis catheter EXAM:  IMAGE GUIDED PLACEMENT OF RIGHT IJ TEMPORARY HEMODIALYSIS CATHETER MEDICATIONS: None ANESTHESIA/SEDATION: None FLUOROSCOPY: Radiation Exposure Index (as provided by the fluoroscopic device): 10 mGy Kerma COMPLICATIONS: None PROCEDURE: Informed written consent was obtained from the patient's family after a discussion of the risks, benefits, and alternatives to treatment. Questions regarding the procedure were encouraged and answered. The right neck was prepped with chlorhexidine in a sterile fashion, and a sterile drape was applied covering the operative field. Maximum barrier sterile technique with sterile gowns and gloves were used for the procedure. A timeout was performed prior to the initiation of the procedure. A micropuncture kit was utilized to access the right internal jugular vein under direct, real-time ultrasound guidance after the overlying soft tissues were anesthetized with 1% lidocaine with epinephrine. Ultrasound image documentation was performed. The microwire was kinked to measure appropriate catheter length. A guidewire was advanced to the level of the IVC. A 16 cm hemodialysis catheter was then placed over the wire. Final catheter positioning was confirmed and documented with a spot radiographic image. The catheter aspirates and flushes normally. The catheter was flushed with appropriate volume heparin dwells. Dressings were applied. The patient tolerated the procedure well without immediate post procedural complication. IMPRESSION: Status post right IJ temporary hemodialysis catheter. Signed, Dulcy Fanny. Dellia Nims, RPVI [facility] Radiology Electronically Signed   By: Corrie Mckusick D.O.   On: 06/26/2021 16:30  ? ?IR US Guide Vasc Access Right ? ?Result Date: 06/26/2021 ?INDICATION: 71 year old female referred for  temporary hemodialysis catheter EXAM: IMAGE GUIDED PLACEMENT OF RIGHT IJ TEMPORARY HEMODIALYSIS CATHETER MEDICATIONS: None  ANESTHESIA/SEDATION: None FLUOROSCOPY: Radiation Exposure Index (as provided by the fluoroscopic device): 10 mGy Kerma COMPLICATIONS: None PROCEDURE: Informed written consent was obtained from the patient's family after a discussion of the risks, benefits, and alternatives to treatment. Questions regarding the procedure were encouraged and answered. The right neck was prepped with chlorhexidine in a sterile fashion, and a sterile drape was applied covering the operative field. Maximum barrier sterile technique with sterile gowns and gloves were used for the procedure. A timeout was performed prior to the initiation of the procedure. A micropuncture kit was utilized to access the right internal jugular vein under direct, real-time ultrasound guidance after the overlying soft tissues were anesthetized with 1% lidocaine with epinephrine. Ultrasound image documentation was performed. The microwire was kinked to measure appropriate catheter length. A guidewire was advanced to the level of the IVC. A 16 cm hemodialysis catheter was then placed over the wire. Final catheter positioning was confirmed and documented with a spot radiographic image. The catheter aspirates and flushes normally. The catheter was flushed with appropriate volume heparin dwells. Dressings were applied. The patient tolerated the procedure well without immediate post procedural complication. IMPRESSION: Status post right IJ temporary hemodialysis catheter. Signed, Dulcy Fanny. Dellia Nims, RPVI [facility] Radiology Electronically Signed   By: Corrie Mckusick D.O.   On: 06/26/2021 16:30  ? ?DG Chest Port 1 View ? ?Result Date: 06/27/2021 ?CLINICAL DATA:  Shortness of breath. EXAM: PORTABLE CHEST 1 VIEW COMPARISON:  06/25/2021 FINDINGS: Stable cardiac enlargement. New right IJ dialysis catheter in good position without complicating features. Worsening bilateral pulmonary infiltrates most notably in the right  upper lobe and right lower lobe. Associated right pleural effusion appears larger. No pneumothorax. IMPRESSION: 1. New right IJ dialysis catheter in good position. 2. Worsening bilateral pulmonary infiltrates and enlarging right pleural effusion. Electronically Signed   By: Marijo Sanes M.D.   On: 06/27/2021 07:29   ? ?Labs: ?BMET ?Recent Labs  ?Lab 06/22/21 ?1240 06/23/21 ?9476 06/24/21 ?5465 06/25/21 ?0120 06/26/21 ?0254 06/27/21 ?0139 06/28/21 ?0133  ?NA 136 139 144 140 136 135 134*  ?K 5.7* 5.7* 4.7 5.0 4.9 3.9 3.7  ?CL 112* 113* 114* 114* 110 105 102  ?CO2 10* 14* 15* 13* 12* 18* 20*  ?GLUCOSE 204* 147* 105* 146* 135* 84 163*  ?BUN 104* 96* 100* 101* 106* 71* 51*  ?CREATININE 4.47* 4.19* 4.27* 4.82* 4.91* 3.77* 3.25*  ?CALCIUM 8.5* 7.5* 7.4* 7.4* 7.9* 7.9* 7.6*  ?PHOS  --   --   --  6.9*  --   --   --   ? ?CBC ?Recent Labs  ?Lab 06/25/21 ?0120 06/26/21 ?0354 06/27/21 ?0139 06/28/21 ?0133  ?WBC 10.1 9.8 8.1 8.6  ?NEUTROABS 8.5* 8.1* 6.2 7.4  ?HGB 7.9* 8.1* 7.9* 7.7*  ?HCT 24.5* 25.1* 23.9* 24.1*  ?MCV 95.3 95.1 93.7 95.6  ?PLT 104* 108* 121* 158  ? ? ?Medications:   ? ? apixaban  5 mg Oral BID  ? atorvastatin  40 mg Oral q1800  ? Chlorhexidine Gluconate Cloth  6 each Topical Q0600  ? Chlorhexidine Gluconate Cloth  6 each Topical Q0600  ? darbepoetin (ARANESP) injection - NON-DIALYSIS  40 mcg Subcutaneous Q Tue-1800  ? insulin aspart  0-6 Units Subcutaneous TID WC  ? metoprolol tartrate  12.5 mg Oral BID  ? pantoprazole  40 mg Oral Daily  ? predniSONE  60  mg Oral Daily  ? ? ? ?Alcus Dad, MD ?06/28/2021, 8:21 AM  ?PGY-2 [facility] ? ? ?Seen and examined independently.  Agree with note and exam as documented above by Dr. Rock Nephew and as noted here. ? ?She feels much better than when she came in.  Spoke with pt and her daughter at bedside.  ?  ?  ?General adult female in bed in no acute distress ?HEENT normocephalic atraumatic extraocular movements intact sclera anicteric ?Neck supple trachea  midline ?Lungs clear to auscultation and normal work of breathing at rest ?Heart S1S2 no rub ?Abdomen soft nontender nondistended ?Extremities no pitting edema  ?Psych normal mood and affect ?Neuro - alert and conversant; provides hx and fo

## 2021-06-28 NOTE — Progress Notes (Signed)
SATURATION QUALIFICATIONS: (This note is used to comply with regulatory documentation for home oxygen) ? ?Patient Saturations on Room Air at Rest = 93%% ? ?Patient Saturations on Room Air while Ambulating = 93% ? ? ?Patient did not require oxygen while ambulating. ? ? ?Arby Barrette, PT ?Acute Rehabilitation Services  ?Pager [phone] ?Office [phone] ? ?

## 2021-06-28 NOTE — Progress Notes (Signed)
Met with pt's daughter, April, this morning at bedside. Discussed pt's out-pt HD options close to pt's home. Daughter prefers [facility] (pt resides in [place] in spite of having a [place] address). Referral made to [facility] admissions this morning. Daughter reports that she will provide transportation to out-pt HD appts at d/c. Will assist as needed.  ? ?Melven Sartorius ?Renal Navigator ?[phone] ?

## 2021-06-29 LAB — CBC WITH DIFFERENTIAL/PLATELET
Abs Immature Granulocytes: 0.4 10*3/uL — ABNORMAL HIGH (ref 0.00–0.07)
Basophils Absolute: 0 10*3/uL (ref 0.0–0.1)
Basophils Relative: 0 %
Eosinophils Absolute: 0 10*3/uL (ref 0.0–0.5)
Eosinophils Relative: 0 %
HCT: 26.2 % — ABNORMAL LOW (ref 36.0–46.0)
Hemoglobin: 8.3 g/dL — ABNORMAL LOW (ref 12.0–15.0)
Lymphocytes Relative: 4 %
Lymphs Abs: 0.5 10*3/uL — ABNORMAL LOW (ref 0.7–4.0)
MCH: 30.2 pg (ref 26.0–34.0)
MCHC: 31.7 g/dL (ref 30.0–36.0)
MCV: 95.3 fL (ref 80.0–100.0)
Metamyelocytes Relative: 3 %
Monocytes Absolute: 0.1 10*3/uL (ref 0.1–1.0)
Monocytes Relative: 1 %
Neutro Abs: 10.8 10*3/uL — ABNORMAL HIGH (ref 1.7–7.7)
Neutrophils Relative %: 92 %
Platelets: 209 10*3/uL (ref 150–400)
RBC: 2.75 MIL/uL — ABNORMAL LOW (ref 3.87–5.11)
RDW: 14.3 % (ref 11.5–15.5)
WBC: 11.7 10*3/uL — ABNORMAL HIGH (ref 4.0–10.5)
nRBC: 0.2 % (ref 0.0–0.2)
nRBC: 1 /100 WBC — ABNORMAL HIGH

## 2021-06-29 LAB — COMPREHENSIVE METABOLIC PANEL
ALT: 7 U/L (ref 0–44)
AST: 17 U/L (ref 15–41)
Albumin: 1.8 g/dL — ABNORMAL LOW (ref 3.5–5.0)
Alkaline Phosphatase: 57 U/L (ref 38–126)
Anion gap: 12 (ref 5–15)
BUN: 69 mg/dL — ABNORMAL HIGH (ref 8–23)
CO2: 18 mmol/L — ABNORMAL LOW (ref 22–32)
Calcium: 7.5 mg/dL — ABNORMAL LOW (ref 8.9–10.3)
Chloride: 102 mmol/L (ref 98–111)
Creatinine, Ser: 4.21 mg/dL — ABNORMAL HIGH (ref 0.44–1.00)
GFR, Estimated: 11 mL/min — ABNORMAL LOW (ref >60)
Glucose, Bld: 174 mg/dL — ABNORMAL HIGH (ref 70–99)
Potassium: 4 mmol/L (ref 3.5–5.1)
Sodium: 132 mmol/L — ABNORMAL LOW (ref 135–145)
Total Bilirubin: 0.8 mg/dL (ref 0.3–1.2)
Total Protein: 5 g/dL — ABNORMAL LOW (ref 6.5–8.1)

## 2021-06-29 LAB — GLUCOSE, CAPILLARY
Glucose-Capillary: 123 mg/dL — ABNORMAL HIGH (ref 70–99)
Glucose-Capillary: 122 mg/dL — ABNORMAL HIGH (ref 70–99)
Glucose-Capillary: 139 mg/dL — ABNORMAL HIGH (ref 70–99)
Glucose-Capillary: 207 mg/dL — ABNORMAL HIGH (ref 70–99)

## 2021-06-29 LAB — C-REACTIVE PROTEIN: CRP: 11.1 mg/dL — ABNORMAL HIGH (ref <1.0)

## 2021-06-29 LAB — BRAIN NATRIURETIC PEPTIDE: B Natriuretic Peptide: 466.3 pg/mL — ABNORMAL HIGH (ref 0.0–100.0)

## 2021-06-29 LAB — PROCALCITONIN: Procalcitonin: 16.41 ng/mL

## 2021-06-29 LAB — MAGNESIUM: Magnesium: 1.9 mg/dL (ref 1.7–2.4)

## 2021-06-29 MED ORDER — CEFAZOLIN SODIUM-DEXTROSE 2-4 GM/100ML-% IV SOLN
2.0000 g | INTRAVENOUS | Status: AC
  Administered 2021-07-02: 2 g via INTRAVENOUS

## 2021-06-29 MED ORDER — FUROSEMIDE 40 MG PO TABS
80.0000 mg | ORAL_TABLET | ORAL | Status: DC
Start: 2021-06-30 — End: 2021-07-02
  Administered 2021-07-02: 80 mg via ORAL
  Filled 2021-06-29: qty 2

## 2021-06-29 MED ORDER — DARBEPOETIN ALFA 100 MCG/0.5ML IJ SOSY: 100.0000 ug | PREFILLED_SYRINGE | INTRAMUSCULAR | Status: DC

## 2021-06-29 MED ORDER — CHLORHEXIDINE GLUCONATE CLOTH 2 % EX PADS
6.0000 | MEDICATED_PAD | Freq: Every day | CUTANEOUS | Status: DC
  Administered 2021-06-30 – 2021-07-02 (×2): 6 via TOPICAL

## 2021-06-29 MED ORDER — HEPARIN SODIUM (PORCINE) 1000 UNIT/ML IJ SOLN
INTRAMUSCULAR | Status: AC
  Administered 2021-06-29: 2600 [IU]
  Filled 2021-06-29: qty 4

## 2021-06-29 MED ORDER — CEFAZOLIN SODIUM-DEXTROSE 2-4 GM/100ML-% IV SOLN: 2.0000 g | INTRAVENOUS | Status: DC

## 2021-06-29 MED ORDER — PREDNISONE 20 MG PO TABS
40.0000 mg | ORAL_TABLET | Freq: Every day | ORAL | Status: DC
  Administered 2021-06-30 – 2021-07-02 (×3): 40 mg via ORAL
  Filled 2021-06-29 (×3): qty 2

## 2021-06-29 NOTE — Progress Notes (Addendum)
Contacted [facility] admissions this am to request update regarding out-pt HD clinic acceptance. Awaiting response.  ? ?Melven Sartorius ?Renal Navigator ?[phone] ? ?Addendum at 3:30 pm: ?[employer] directly and spoke to [employer] manager, Butch Penny. Pt's case is still under review at this time. Medical director needs to review and approve. Butch Penny states pt's likely schedule will be TTS once pt approved. Butch Penny uncertain if pt will be approved this afternoon. Will continue to follow and assist.  ? ?Addendum at 6:45 pm: ?Contacted by [facility] admissions this afternoon after 5:00 with final approval. Pt has been accepted at [facility] on TTS schedule with 1:25 chair time. Pt can start on Tuesday, May 16. Pt will need to arrive between 12:30 -12:45 to complete paperwork prior to first treatment. Spoke to pt's daughter, April, via phone. Provided above details. Text sent to daughter with above details noted as well. Daughter agreeable to plan. Update provided to nephrologist via phone as well.  ?

## 2021-06-29 NOTE — Consult Note (Signed)
? ?Chief Complaint: ?Patient was seen in consultation today for placement of HD catheter ? at the request of nephrology ? ?Referring Physician(s): ?Harrie Jeans, MD ? ?Supervising Physician: Jacqulynn Cadet ? ?Patient Status: [facility] - In-pt ? ?History of Present Illness: ?Mackenzie [name] is a 71 y.o. female with acute renal failure on CKD.  She has temp cath currently and nephrology has asked for a tunneled HD cath to be placed.  Mackenzie [name] is accompanied bedside by her daughter, April.  She had dialysis through her temp cath this morning.  No current complaints. ? ?Past Medical History:  ?Diagnosis Date  ? Arthritis   ? Atrial fibrillation ([place])   ? CHF (congestive heart failure) ([place])   ? DM2 (diabetes mellitus, type 2) ([place]) 06/24/2021  ? HLD (hyperlipidemia)   ? Hypertension   ? ? ?Past Surgical History:  ?Procedure Laterality Date  ? COLONOSCOPY WITH PROPOFOL N/A 01/19/2021  ? hemorrhoids on perianal exam, examined portion ofileum normal, entire examined colon, normal. non bleeding external hemorrhoids, no specimens  ? ESOPHAGOGASTRODUODENOSCOPY (EGD) WITH PROPOFOL N/A 01/17/2021  ? normal hypopharynx and esophagus, z line regular 37cm from incisors, 2cm HH, congestive gastropathy, hematobulbar and post bulbar mucosa with hemosiderosis but no evidence of PUD. no specimens  ? GIVENS CAPSULE STUDY N/A 02/02/2021  ? Procedure: GIVENS CAPSULE STUDY;  Surgeon: Harvel Quale, MD;  Location: [facility];  Service: Gastroenterology;  Laterality: N/A;  7:30  ? IR FLUORO GUIDE CV LINE RIGHT  06/26/2021  ? IR US GUIDE VASC ACCESS RIGHT  06/26/2021  ? LEFT HEART CATH AND CORONARY ANGIOGRAPHY Right 05/06/2016  ? Procedure: Left Heart Cath and Coronary Angiography;  Surgeon: [name], MD;  Location: [facility];  Service: Cardiovascular;  Laterality: Right;  ? ? ?Allergies: ?Patient has no known allergies. ? ?Medications: ?Prior to Admission medications   ?Medication Sig Start Date End Date Taking?  Authorizing Provider  ?acetaminophen (TYLENOL) 325 MG tablet Take 2 tablets (650 mg total) by mouth every 6 (six) hours as needed for mild pain (or Fever >/= 101). 06/24/21   Lorella Nimrod, MD  ?albuterol (PROVENTIL HFA;VENTOLIN HFA) 108 (90 Base) MCG/ACT inhaler Inhale 2 puffs into the lungs daily as needed for wheezing or shortness of breath. 06/11/16   Alisa Graff, FNP  ?amLODipine (NORVASC) 5 MG tablet Take 1 tablet (5 mg total) by mouth daily. 03/17/21   Phillips Grout, MD  ?apixaban (ELIQUIS) 5 MG TABS tablet Take 1 tablet (5 mg total) by mouth 2 (two) times daily. 06/11/16   Alisa Graff, FNP  ?atorvastatin (LIPITOR) 40 MG tablet Take 1 tablet (40 mg total) by mouth daily at 6 PM. 06/11/16   Alisa Graff, FNP  ?benzonatate (TESSALON) 100 MG capsule Take 1 capsule (100 mg total) by mouth 3 (three) times daily as needed for cough. 06/24/21   Lorella Nimrod, MD  ?blood glucose meter kit and supplies KIT Dispense based on patient and insurance preference. Use up to four times daily as directed. 01/20/21   Manuella Ghazi, Pratik D, DO  ?ceFEPIme 2 g in sodium chloride 0.9 % 100 mL Inject 2 g into the vein daily. 06/25/21   Lorella Nimrod, MD  ?ferrous sulfate 325 (65 FE) MG tablet Take 1 tablet (325 mg total) by mouth daily. 01/20/21 01/20/22  Manuella Ghazi, Pratik D, DO  ?furosemide (LASIX) 40 MG tablet Take 40 mg by mouth daily. 06/05/21   [provider]  ?insulin aspart (NOVOLOG) 100 UNIT/ML  injection Inject 0-9 Units into the skin 3 (three) times daily with meals. 06/24/21   Lorella Nimrod, MD  ?ipratropium-albuterol (DUONEB) 0.5-2.5 (3) MG/3ML SOLN Take 3 mLs by nebulization every 6 (six) hours. 06/24/21   Lorella Nimrod, MD  ?metoprolol tartrate (LOPRESSOR) 25 MG tablet Take 0.5 tablets (12.5 mg total) by mouth 2 (two) times daily. 06/24/21   Lorella Nimrod, MD  ?ondansetron (ZOFRAN) 4 MG tablet Take 1 tablet (4 mg total) by mouth every 6 (six) hours as needed for nausea. 06/24/21   Lorella Nimrod, MD  ?ondansetron (ZOFRAN) 4 MG/2ML  SOLN injection Inject 2 mLs (4 mg total) into the vein every 6 (six) hours as needed for nausea. 06/24/21   Lorella Nimrod, MD  ?pantoprazole (PROTONIX) 40 MG tablet Take 1 tablet (40 mg total) by mouth daily. 01/20/21 01/20/22  Heath Lark D, DO  ?patiromer Daryll Drown) 8.4 g packet Take 2 packets (16.8 g total) by mouth daily. 06/25/21   Lorella Nimrod, MD  ?predniSONE (DELTASONE) 20 MG tablet Take 1 tablet (20 mg total) by mouth daily. 06/25/21   Lorella Nimrod, MD  ?sodium bicarbonate 650 MG tablet Take 2 tablets (1,300 mg total) by mouth 2 (two) times daily. 06/24/21   Lorella Nimrod, MD  ?spironolactone (ALDACTONE) 25 MG tablet Take 25 mg by mouth daily. 04/06/21   [provider]  ?  ? ?Family History  ?Problem Relation Age of Onset  ? Diabetes Mother   ? Heart disease Mother   ? Heart attack Father   ? Diabetes Brother   ? Breast cancer Maternal Grandmother   ? Breast cancer Maternal Aunt   ? Colon cancer Neg Hx   ? Colon polyps Neg Hx   ? ? ?Social History  ? ?Socioeconomic History  ? Marital status: Widowed  ?  Spouse name: Not on file  ? Number of children: Not on file  ? Years of education: Not on file  ? Highest education level: Not on file  ?Occupational History  ? Not on file  ?Tobacco Use  ? Smoking status: Never  ? Smokeless tobacco: Never  ?Vaping Use  ? Vaping Use: Never used  ?Substance and Sexual Activity  ? Alcohol use: No  ? Drug use: No  ? Sexual activity: Not on file  ?Other Topics Concern  ? Not on file  ?Social History Narrative  ? Not on file  ? ?Social Determinants of Health  ? ?Financial Resource Strain: Not on file  ?Food Insecurity: Not on file  ?Transportation Needs: Not on file  ?Physical Activity: Not on file  ?Stress: Not on file  ?Social Connections: Not on file  ? ? ?Review of Systems: A 12 point ROS discussed and pertinent positives are indicated in the HPI above.  All other systems are negative. ? ?Vital Signs: ?BP 117/69 (BP Location: Left Arm)   Pulse (!) 115   Temp 98.2 ?F (36.8  ?C) (Oral)   Resp 18   Ht _0  (1.676 m)   Wt 165 lb 5.5 oz (75 kg)   SpO2 96%   BMI 26.69 kg/m?  ? ?Physical Exam ?Constitutional:   ?   General: She is not in acute distress. ?   Appearance: She is ill-appearing.  ?HENT:  ?   Head: Normocephalic.  ?   Mouth/Throat:  ?   Mouth: Mucous membranes are moist.  ?   Pharynx: Oropharynx is clear.  ?Cardiovascular:  ?   Rate and Rhythm: Tachycardia present.  ?   Pulses:  Normal pulses.  ?Pulmonary:  ?   Effort: Pulmonary effort is normal.  ?Abdominal:  ?   Palpations: Abdomen is soft.  ?Neurological:  ?   General: No focal deficit present.  ?   Mental Status: She is alert.  ? ? ?Imaging: ?US RENAL ? ?Result Date: 06/25/2021 ?CLINICAL DATA:  Acute kidney injury. EXAM: RENAL / URINARY TRACT ULTRASOUND COMPLETE COMPARISON:  CT abdomen pelvis dated Jun 22, 2021. Renal ultrasound dated March 06, 2021. FINDINGS: Right Kidney: Renal measurements: 11.2 x 5.1 x 4.8 cm = volume: 144 mL. Unchanged increased echogenicity. No mass or hydronephrosis visualized. 1.0 cm simple cyst. No follow-up imaging is recommended. Left Kidney: Renal measurements: 10.6 x 5.9 x 5.7 cm = volume: 186 mL. Unchanged increased echogenicity. No mass or hydronephrosis visualized. 1.0 cm simple cysts. No follow-up imaging is recommended. Bladder: Appears normal for degree of bladder distention. Other: None. IMPRESSION: 1. No acute abnormality. 2. Unchanged increased bilateral renal echogenicity, consistent with medical renal disease. Electronically Signed   By: Titus Dubin M.D.   On: 06/25/2021 11:38  ? ?IR Fluoro Guide CV Line Right ? ?Result Date: 06/26/2021 ?INDICATION: 71 year old female referred for temporary hemodialysis catheter EXAM: IMAGE GUIDED PLACEMENT OF RIGHT IJ TEMPORARY HEMODIALYSIS CATHETER MEDICATIONS: None ANESTHESIA/SEDATION: None FLUOROSCOPY: Radiation Exposure Index (as provided by the fluoroscopic device): 10 mGy Kerma COMPLICATIONS: None PROCEDURE: Informed written consent was  obtained from the patient's family after a discussion of the risks, benefits, and alternatives to treatment. Questions regarding the procedure were encouraged and answered. The right neck was prepped with chlorh

## 2021-06-29 NOTE — Progress Notes (Signed)
?                                  PROGRESS NOTE                                             ?                                                                                                                     ?                                         ? ? Patient Demographics:  ? ? Mackenzie [name], is a 71 y.o. female, DOB - 06-Nov-1950, HYW:[id number] ? ?Outpatient Primary MD for the patient is [name], MD    LOS - 5  Admit date - 06/24/2021   ? ?No chief complaint on file. ?    ? ?Brief Narrative (HPI from H&P)   71 y.o. female with HTN, CAD, DM 2, HFpEF, CKD 5 and atrial fibrillation, her daughter noticed that patient started having progressive fatigue, malaise and then became progressively more short of breath with mild cough.  She saw her PCP where she was diagnosed with UTI, she was subsequently admitted to [facility] hospital for sepsis, pneumonia, acute hypoxic respiratory failure, AKI on CKD 5, she was then transferred to [facility] for further care upon family request. ? ? Subjective:  ? ?Patient in bed, appears comfortable, denies any headache, no fever, no chest pain or pressure, no shortness of breath , no abdominal pain. No new focal weakness. ? ? ? Assessment  & Plan :  ? ? ?Acute hypoxic respiratory failure due to acute on chronic diastolic CHF EF 48% on last echocardiogram 1 year ago along with community-acquired pneumonia with severe sepsis upon admission at [facility] -she is currently on IV antibiotics which will be continued, she clearly was also in fluid overload and received a dose of IV Lasix on 06/25/2021 by nephrology after which her respiratory failure is much improved and lung sounds are considerably better, continue to advance activity and titrate down supplemental oxygen, encouraged to sit in chair use I-S and flutter valve for pulmonary toiletry.  Overall this problem is much improved. ? 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 ? ?E. coli bacteremia.  Likely source UTI which was diagnosed at PCP office.  Continue IV antibiotics - cefazolin will complete total of 10 days.  Clinically sepsis pathophysiology has defervesced, repeat blood cultures drawn 06/26/2021 thus far negative on 06/29/2021. ? ?Paroxysmal atrial fibrillation (HCC) with [place] vasc 2 score of 5.  Continue beta-blocker and Eliquis combination.   ? ?Acute on chronic diastolic CHF with EF 16% on echocardiogram in November 2022.  Kindly see #1 above. ? ?HTN (hypertension) - currently on beta-blocker, monitor with as needed hydralazine in addition to beta-blocker. ? ?Anemia of chronic disease.  Monitor. ? ?Hypomagnesemia.  Replaced. ? ?DM2 (diabetes mellitus, type 2) ([place]) - ISS ? ?Lab Results  ?Component Value Date  ? HGBA1C 5.8 (H) 01/15/2021  ? ? ?CBG (last 3)  ?Recent Labs  ?  06/28/21 ?1622 06/28/21 ?2109 06/29/21 ?0830  ?GLUCAP 216* 204* 123*  ? ? ?   ? ?Condition - Extremely Guarded ? ?Family Communication  : Daughter bedside on 06/26/2021, 06/27/2021, 06/28/2021, daughter April over the phone on 06/26/2021 ? ?Code Status :  Full ? ?Consults  :  Renal ? ?PUD Prophylaxis :   ? ? Procedures  :    ? ?Right IJ HD Temp Cath placed by IR on 06/26/2021 ? ?Renal US  - Non acute ? ?CT  -   Large bilateral airspace opacities are noted, right greater than left, consistent with multifocal pneumonia. Coronary artery calcifications are noted. No acute abnormality seen in the abdomen or pelvis. Aortic Atherosclerosis ? ?   ? ?Disposition Plan  :   ? ?Status is: Inpatient ? ?DVT Prophylaxis  :   ? ?SCDs Start: 06/24/21 2213 ? ?  ? ?Lab Results  ?Component Value Date  ? PLT 209 06/29/2021  ? ? ?Diet :  ?Diet Order   ? ?       ?  Diet NPO time specified  Diet effective midnight       ?  ? ?  ?  ? ?  ?  ? ?Inpatient Medications ? ?Scheduled Meds: ? atorvastatin  40 mg Oral q1800  ? Chlorhexidine Gluconate Cloth  6 each Topical Q0600  ? darbepoetin (ARANESP) injection - NON-DIALYSIS  40 mcg Subcutaneous Q Tue-1800  ? insulin aspart  0-6 Units Subcutaneous TID WC  ? metoprolol tartrate  12.5 mg Oral BID  ? pantoprazole  40 mg Oral Daily  ? predniSONE  60 mg Oral Daily  ? ?Continuous Infusions: ?  ceFAZolin (ANCEF) IV 2 g (06/28/21 2147)  ? ?PRN Meds:.acetaminophen **OR** acetaminophen, albuterol, benzonatate, hydrALAZINE ? Time Spent in minutes  30 ? ? ?Lala Lund M.D on 06/29/2021 at 12:07 PM ? ?To page go to [email or web address]  ? ?[facility] -  Office  [phone] ? ?See all Orders from today for further details ? ? ? Objective:  ? ?Vitals:  ? 06/29/21 1000 06/29/21 1030 06/29/21 1100 06/29/21 1130  ?BP: 140/77 93/60 127/75 125/75  ?Pulse:      ?Resp: (!) 22 (!) '22 18 19  '$ ?Temp:      ?TempSrc:      ?SpO2:      ?Weight:      ?Height:      ? ? ?  Wt Readings from Last 3 Encounters:  ?06/29/21 75 kg  ?06/22/21 72.6 kg  ?05/07/21 78.9 kg  ? ? ?No intake or output data in the 24 hours ending 06/29/21 1207 ? ? ? ? ?Physical Exam ? ?Awake Alert, No new F.N deficits, Normal affect ?[place].AT, does have some conjunctival edema, few crackles,  right IJ temporary dialysis catheter ?Supple Neck, No JVD,   ?Symmetrical Chest wall movement, Good air movement bilaterally, CTAB ?RRR,No Gallops, Rubs or new Murmurs,  ?+ve  B.Sounds, Abd Soft, No tenderness,   ?No Cyanosis, Clubbing or edema  ? ? ? Data Review:  ? ? ?CBC ?Recent Labs  ?Lab 06/25/21 ?0120 06/26/21 ?1308 06/27/21 ?0139 06/28/21 ?0133 06/29/21 ?0216  ?WBC 10.1 9.8 8.1 8.6 11.7*  ?HGB 7.9* 8.1* 7.9* 7.7* 8.3*  ?HCT 24.5* 25.1* 23.9* 24.1* 26.2*  ?PLT 104* 108* 121* 158 209  ?MCV 95.3 95.1 93.7 95.6 95.3  ?MCH 30.7 30.7 31.0 30.6 30.2  ?MCHC 32.2 32.3 33.1 32.0 31.7  ?RDW 15.0 15.1 14.6 14.3 14.3  ?LYMPHSABS 0.2* 0.2* 0.3* 0.3* 0.5*  ?MONOABS 0.2 0.2 0.1 0.1 0.1  ?EOSABS 0.0 0.0 0.0 0.0 0.0  ?BASOSABS 0.1 0.0 0.0 0.0 0.0  ? ? ?Electrolytes ?Recent Labs  ?Lab 06/22/21 ?1240 06/22/21 ?1440 06/23/21 ?6578 06/23/21 ?1729 06/24/21 ?4696 06/25/21 ?0120 06/25/21 ?0708 06/26/21 ?2952 06/27/21 ?0139 06/28/21 ?0133 06/29/21 ?0216 06/29/21 ?8413  ?NA 136  --    < >  --    < > 140  --  136 135 134* 132*  --   ?K 5.7*  --    < >  --    < > 5.0  --  4.9 3.9 3.7 4.0  --   ?CL 112*  --    < >  --    < > 114*  --  110 105 102 102  --   ?CO2 10*  --    < >  --    < > 13*  --  12* 18* 20* 18*  --   ?GLUCOSE 204*  --    < >  --    < > 146*  --  135* 84 163* 174*  --   ?BUN 104*  --    < >  --    < > 101*  --  106* 71* 51* 69*  --   ?CREATININE 4.47*  --    < >  --    < > 4.82*  --  4.91* 3.77* 3.25* 4.21*  --   ?CALCIUM 8.5*  --    < >  --    < > 7.4*  --  7.9* 7.9* 7.6* 7.5*  --   ?AST 24  --    < >  --   --  22  --  '19 18 15 17  '$ --   ?ALT 14  --    < >  --   --  16  --  '14 15 11 7  '$ --   ?ALKPHOS 48  --    < >  --   --  55  --  57 53 53 57  --   ?BILITOT 0.8  --    < >  --   --  1.0  --  0.8 1.5* 1.0 0.8  --   ?ALBUMIN 3.0*  --    < >  --   --  2.0*  --  1.9* 1.8* 1.8* 1.8*  --   ?MG  --   --   --   --   --  1.3*  --  2.0 1.7 1.7 1.9  --   ?CRP  --   --   --   --   --   --  22.8* 19.4* 14.1* 18.8* 11.1*  --   ?PROCALCITON  --   --   --  76.05   < > 64.87  --  40.53 32.79 22.44  --  16.41  ?LATICACIDVEN 4.4* 5.7*  --  1.2  --   --   --   --   --   --   --   --   ?INR 3.7*  --   --   --   --    --   --   --   --   --   --   --   ?BNP  --   --    < > 224.3*  --  322.0*  --  350.2* 338.1* 247.8* 466.3*  --   ? < > = values in this interval not displayed.  ? ?Radiology Reports ?IR Fluoro Guide CV Line Ri

## 2021-06-29 NOTE — Procedures (Signed)
Seen and examined on dialysis.  Blood pressure 143/80 and HR 84.  Tolerating goal.  Procedure supervised. RIJ nontunn catheter in use.  ? ?Claudia Desanctis, MD ?06/29/2021  10:19 AM ? ? ?

## 2021-06-29 NOTE — Progress Notes (Addendum)
?[facility] ?Progress Note  ? ? 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 At this point unclear whether this represents AKI in the setting of her acute illness vs progression to ESRD. Felt to be more likely the latter. Renal function worse this morning after holding HD yesterday. ?-Receiving HD this morning (5/12) ?-Plan for IR to place tunneled HD cath today ?-Continue Prednisone w/Protonix for GI ppx ?-Due for Rituxan end of May if still indicated ?-Arranging outpatient HD ?HFpEF- Clinically appears euvolemic. Now on room air. Received Lasix '80mg'$  IV yesterday but unfortunately UOP not well recorded. Consider transitioning to her home PO Lasix ('40mg'$  daily) ?E Coli Bacteremia- antibiotics per primary. Repeat cultures show NGTD (x4 days) ?Metabolic Acidosis- secondary to renal failure. Manage w/HD ?Anemia of CKD- Aranesp 3mg weekly (qTuesday), first dose 5/9. S/p IV iron x2 doses. Hgb stable at 8.3 this morning. ? ?Subjective:   ?No acute events overnight. Patient feels well this morning. Reports she wants to eat some popcorn.   ? ?Objective:   ?BP (!) 142/71 (BP Location: Right Arm)   Pulse 79   Temp 97.6 ?F (36.4 ?C) (Oral)   Resp 20   Ht '5\' 6"'$  (1.676 m)   Wt 73.4 kg   SpO2 95%   BMI 26.12 kg/m?  ?No intake or output data in the 24 hours ending 06/29/21 0916 ? ?Weight change: -1.1 kg ? ?Physical Exam: ?Gen: alert, resting comfortably ?CVS: RRR, normal S1/S2 without m/r/g ?Resp: normal effort, lungs CTAB ?Abd: soft, nontender ?Ext: no pedal edema ?Access: R temp IJ in place ? ? ?Imaging: ?No results found. ? ?Labs: ?BMET ?Recent Labs  ?Lab 06/23/21 ?0151705/07/23 ?0616005/08/23 ?0120 06/26/21 ?0737105/10/23 ?0139 06/28/21 ?0133 06/29/21 ?0216  ?NA 139 144 140 136 135 134* 132*  ?K 5.7* 4.7 5.0 4.9 3.9 3.7 4.0  ?CL 113* 114* 114* 110 105 102 102  ?CO2 14* 15* 13* 12* 18* 20* 18*  ?GLUCOSE 147* 105* 146* 135* 84 163* 174*  ?BUN 96* 100* 101*  106* 71* 51* 69*  ?CREATININE 4.19* 4.27* 4.82* 4.91* 3.77* 3.25* 4.21*  ?CALCIUM 7.5* 7.4* 7.4* 7.9* 7.9* 7.6* 7.5*  ?PHOS  --   --  6.9*  --   --   --   --   ? ?CBC ?Recent Labs  ?Lab 06/26/21 ?0254 06/27/21 ?0139 06/28/21 ?0133 06/29/21 ?0216  ?WBC 9.8 8.1 8.6 11.7*  ?NEUTROABS 8.1* 6.2 7.4 10.8*  ?HGB 8.1* 7.9* 7.7* 8.3*  ?HCT 25.1* 23.9* 24.1* 26.2*  ?MCV 95.1 93.7 95.6 95.3  ?PLT 108* 121* 158 209  ? ? ?Medications:   ? ? atorvastatin  40 mg Oral q1800  ? Chlorhexidine Gluconate Cloth  6 each Topical Q0600  ? darbepoetin (ARANESP) injection - NON-DIALYSIS  40 mcg Subcutaneous Q Tue-1800  ? insulin aspart  0-6 Units Subcutaneous TID WC  ? metoprolol tartrate  12.5 mg Oral BID  ? pantoprazole  40 mg Oral Daily  ? predniSONE  60 mg Oral Daily  ? ? ? ?AAlcus Dad MD ?06/29/2021, 9:16 AM  ?PGY-2 [facility] ? ? ?Seen and examined independently.  Agree with note and exam as documented above by Dr. WRock Nephewand as noted here. ? ?General adult female in bed in no acute distress ?HEENT normocephalic atraumatic extraocular movements intact sclera anicteric ?Neck supple trachea midline ?Lungs clear to auscultation and normal work of breathing at rest ?Heart S1S2 no rub ?Abdomen soft nontender nondistended ?  Extremities no pitting edema  ?Psych normal mood and affect ?Neuro - alert and conversant; provides hx and follows commands ?Access :RIJ nontunneled HD catheter ? ?# AKI  ?- Pauci immune glomerulonephritis.  She has some uremic symptoms and metabolic derangements for which HD is indicated.  Started on HD on 5/9 ?- Continue prednisone.  Reduce to 40 mg daily for now  ?- HD today and per MWF schedule here for now ?- she may have ultimately progressed to ESRD.  Follow response to HD and UOP ?- I have initiated the process to search for an outpatient dialysis unit.  (Currently as AKI)  She does not yet have a spot at an outpatient HD unit ?- on protonix for GI ppx ?- rituxan due later in May if continued  ?-  eliquis is on hold for tunneled catheter placement ?- I consulted IR again to replace non-tunneled catheter with a tunneled catheter - appreciate their assistance  ?  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 ?# Heart failure with preserved EF  ?- volume status improved  ?  ?# CAP  ?- abx per primary team  ?  ?# E coli Bacteremia ?- note e coli and enterobacterales in blood last check  ?- updated blood cultures - NGTD - requested tunn catheter as above ?- abx per primary team  ?  ?# Metabolic acidosis  ?- secondary to renal failure  ?- now managed with HD ?  ?# Hyperkalemia ?- resolved on HD ?  ?# HTN  ?- optimize volume with HD ?  ?# Anemia CKD  ?- on aranesp 40 mcg weekly on Tuesdays - will increase dose to 100 mcg weekly  ?- IV iron x 2 doses ?  ?Dispo - continue inpatient monitoring.  She needs an outpatient HD unit and has not yet been cleared.  I spoke with HD SW this am and again this afternoon ? ? ?Claudia Desanctis, MD ?06/29/2021 ? 4:45 PM ? ? ?Addendum.  I just received a call that she has been approved for [facility].  HD SW is calling daughter to discuss details as well.  She will be on a TTS schedule and can start there outpatient on Tuesday, 5/16.  ? ?Will assess her tomorrow and will try for HD again tomorrow staffing permitting to get her on a TTS schedule.  After the next treatment she is stable for discharge from my standpoint ? ?Claudia Desanctis, MD ?5:13 PM ?06/29/2021 ? ? ?

## 2021-06-30 LAB — CBC WITH DIFFERENTIAL/PLATELET
Abs Immature Granulocytes: 0 10*3/uL (ref 0.00–0.07)
Basophils Absolute: 0 10*3/uL (ref 0.0–0.1)
Basophils Relative: 0 %
Eosinophils Absolute: 0 10*3/uL (ref 0.0–0.5)
Eosinophils Relative: 0 %
HCT: 27 % — ABNORMAL LOW (ref 36.0–46.0)
Hemoglobin: 8.1 g/dL — ABNORMAL LOW (ref 12.0–15.0)
Lymphocytes Relative: 3 %
Lymphs Abs: 0.3 10*3/uL — ABNORMAL LOW (ref 0.7–4.0)
MCH: 29.9 pg (ref 26.0–34.0)
MCHC: 30 g/dL (ref 30.0–36.0)
MCV: 99.6 fL (ref 80.0–100.0)
Monocytes Absolute: 0 10*3/uL — ABNORMAL LOW (ref 0.1–1.0)
Monocytes Relative: 0 %
Neutro Abs: 8.3 10*3/uL — ABNORMAL HIGH (ref 1.7–7.7)
Neutrophils Relative %: 97 %
Platelets: 232 10*3/uL (ref 150–400)
RBC: 2.71 MIL/uL — ABNORMAL LOW (ref 3.87–5.11)
RDW: 14 % (ref 11.5–15.5)
WBC: 8.6 10*3/uL (ref 4.0–10.5)
nRBC: 0.4 % — ABNORMAL HIGH (ref 0.0–0.2)
nRBC: 1 /100 WBC — ABNORMAL HIGH

## 2021-06-30 LAB — COMPREHENSIVE METABOLIC PANEL
ALT: <5 U/L (ref 0–44)
AST: 14 U/L — ABNORMAL LOW (ref 15–41)
Albumin: 2 g/dL — ABNORMAL LOW (ref 3.5–5.0)
Alkaline Phosphatase: 61 U/L (ref 38–126)
Anion gap: 12 (ref 5–15)
BUN: 41 mg/dL — ABNORMAL HIGH (ref 8–23)
CO2: 22 mmol/L (ref 22–32)
Calcium: 7.6 mg/dL — ABNORMAL LOW (ref 8.9–10.3)
Chloride: 102 mmol/L (ref 98–111)
Creatinine, Ser: 3.36 mg/dL — ABNORMAL HIGH (ref 0.44–1.00)
GFR, Estimated: 14 mL/min — ABNORMAL LOW (ref >60)
Glucose, Bld: 186 mg/dL — ABNORMAL HIGH (ref 70–99)
Potassium: 4 mmol/L (ref 3.5–5.1)
Sodium: 136 mmol/L (ref 135–145)
Total Bilirubin: 0.4 mg/dL (ref 0.3–1.2)
Total Protein: 5 g/dL — ABNORMAL LOW (ref 6.5–8.1)

## 2021-06-30 LAB — CULTURE, BLOOD (ROUTINE X 2)
Culture: NO GROWTH
Culture: NO GROWTH
Special Requests: ADEQUATE

## 2021-06-30 LAB — GLUCOSE, CAPILLARY
Glucose-Capillary: 134 mg/dL — ABNORMAL HIGH (ref 70–99)
Glucose-Capillary: 116 mg/dL — ABNORMAL HIGH (ref 70–99)
Glucose-Capillary: 211 mg/dL — ABNORMAL HIGH (ref 70–99)
Glucose-Capillary: 207 mg/dL — ABNORMAL HIGH (ref 70–99)

## 2021-06-30 LAB — PROCALCITONIN: Procalcitonin: 11.09 ng/mL

## 2021-06-30 MED ORDER — FUROSEMIDE 40 MG PO TABS
80.0000 mg | ORAL_TABLET | ORAL | Status: DC
Start: 2021-07-01 — End: 2021-07-02
  Administered 2021-07-01: 80 mg via ORAL
  Filled 2021-06-30: qty 2

## 2021-06-30 NOTE — Progress Notes (Addendum)
?                                  PROGRESS NOTE                                             ?                                                                                                                     ?                                         ? ? Patient Demographics:  ? ? Mackenzie [name], is a 71 y.o. female, DOB - [birth year], YKZ:[id number] ? ?Outpatient Primary MD for the patient is [name], MD    LOS - 6  Admit date - 06/24/2021   ? ?No chief complaint on file. ?    ? ?Brief Narrative (HPI from H&P)   71 y.o. female with HTN, CAD, LBBB, DM 2, HFpEF, CKD 5 and atrial fibrillation, her daughter noticed that patient started having progressive fatigue, malaise and then became progressively more short of breath with mild cough.  She saw her PCP where she was diagnosed with UTI, she was subsequently admitted to [facility] hospital for sepsis, pneumonia, acute hypoxic respiratory failure, AKI on CKD 5, she was then transferred to [facility] for further care upon family request. ? ? Subjective:  ? ?Patient in bed, appears comfortable, denies any headache, no fever, no chest pain or pressure, no shortness of breath , no abdominal pain. No focal weakness. ? ? Assessment  & Plan :  ? ? ?Acute hypoxic respiratory failure due to acute on chronic diastolic CHF EF 93% on last echocardiogram 1 year ago along with community-acquired pneumonia with severe sepsis upon admission at [facility] -pneumonia has been clinically treated and resolved, fluid overload much improved after she was started on dialysis.  She is now on room air.  This problem has resolved ? 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  Outpatient dialysis also  being arranged ? ?E. coli bacteremia.  Likely source UTI which was diagnosed at PCP office.  Continue IV antibiotics - cefazolin will complete total of 10 days which will be 07/02/2021.  Clinically sepsis pathophysiology has defervesced, repeat blood cultures drawn 06/26/2021 thus far negative on 06/30/2021. ? ?Paroxysmal atrial fibrillation (HCC) with Mali vasc 2 score of 5.  Continue beta-blocker and Eliquis combination.   ? ?Acute on chronic diastolic CHF with EF 27% on echocardiogram in November 2022.  Kindly see #1 above. ? ?HTN (hypertension) - currently on beta-blocker, monitor with as needed hydralazine in addition to beta-blocker. ? ?CAD with LBBB - B blocker & Statin, no acute issues. ? ?Anemia of chronic disease.  Monitor. ? ?Hypomagnesemia.  Replaced. ? ?DM2 (diabetes mellitus, type 2) ([place]) - ISS ? ?Lab Results  ?Component Value Date  ? HGBA1C 5.8 (H) 01/15/2021  ? ? ?CBG (last 3)  ?Recent Labs  ?  06/29/21 ?1821 06/29/21 ?2110 06/30/21 ?7412  ?GLUCAP 139* 207* 134*  ? ? ?   ? ?Condition - Extremely Guarded ? ?Family Communication  :  ? ?Daughter bedside on 06/26/2021, 06/27/2021, 06/28/2021, 06/29/2021, 06/30/2021.   ? ?Daughter April over the phone on 06/26/2021 ? ?Code Status :  Full ? ?Consults  :  Renal, IR ? ?PUD Prophylaxis :   ? ? Procedures  :    ? ?Right IJ HD Temp Cath placed by IR on 06/26/2021 ? ?Renal US  - Non acute ? ?CT  -  Large bilateral airspace opacities are noted, right greater than left, consistent with multifocal pneumonia. Coronary artery calcifications are noted. No acute abnormality seen in the  abdomen or pelvis. Aortic Atherosclerosis ? ?   ? ?Disposition Plan  :   ? ?Status is: Inpatient ? ?DVT Prophylaxis  :   ? ?SCDs Start: 06/24/21 2213 ? ?  ? ?Lab Results  ?Component Value Date  ? PLT 232 06/30/2021  ? ? ?Diet :  ?Diet Order   ? ?       ?  Diet NPO time specified Except for: Sips with Meds  Diet effective midnight       ?  ?  Diet renal with fluid restriction Fluid restriction: 1200 mL Fluid; Room service appropriate? Yes; Fluid consistency: Thin  Diet effective now       ?  ? ?  ?  ? ?  ?  ? ?Inpatient Medications ? ?Scheduled Meds: ? atorvastatin  40 mg Oral q1800  ? Chlorhexidine Gluconate Cloth  6 each Topical Q0600  ? [START ON 07/03/2021] darbepoetin (ARANESP) injection - NON-DIALYSIS  100 mcg Subcutaneous Q Tue-1800  ? furosemide  80 mg Oral Q M,W,F  ? insulin aspart  0-6 Units Subcutaneous TID WC  ? metoprolol tartrate  12.5 mg Oral BID  ? pantoprazole  40 mg Oral Daily  ? predniSONE  40 mg Oral Q breakfast  ? ?Continuous Infusions: ?  ceFAZolin (ANCEF) IV 200 mL/hr at 06/29/21 2251  ? [START ON 07/02/2021]  ceFAZolin (ANCEF) IV    ? ?PRN Meds:.acetaminophen **OR** acetaminophen, albuterol, benzonatate, hydrALAZINE ? Time Spent in minutes  30 ? ? ?Lala Lund M.D on 06/30/2021 at 8:59 AM ? ?To page go to [email or web address]  ? ?[facility] -  Office  [phone] ? ?See all Orders from today for further details ? ? ? Objective:  ? ?Vitals:  ? 06/29/21 2301 06/30/21 0000 06/30/21 0400 06/30/21 0451  ?BP: 114/72 105/61 118/75   ?Pulse: 86 83 79   ?  Resp: '20 18 20   '$ ?Temp: 98.3 ?F (36.8 ?C)  98 ?F (36.7 ?C)   ?TempSrc: Oral  Oral   ?SpO2: 97%  96%   ?Weight:    73.6 kg  ?Height:      ? ? ?Wt Readings from Last 3 Encounters:  ?06/30/21 73.6 kg  ?06/22/21 72.6 kg  ?05/07/21 78.9 kg  ? ? ? ?Intake/Output Summary (Last 24 hours) at 06/30/2021 0859 ?Last data filed at 06/29/2021 2251 ?Gross per 24 hour  ?Intake 500 ml  ?Output 2284 ml  ?Net -1784 ml  ? ? ? ? ? ?Physical Exam ? ?Awake Alert, No new F.N  deficits, Normal affect ?[place].AT,PERRAL ?Supple Neck, No JVD,   ?Symmetrical Chest wall movement, Good air movement bilaterally, CTAB ?RRR,No Gallops, Rubs or new Murmurs,  ?+ve B.Sounds, Abd Soft, No tenderness,   ?No Cyanosis, right IJ temporary dialysis catheter in place. ? ? ? ? Data Review:  ? ? ?CBC ?Recent Labs  ?Lab 06/26/21 ?0254 06/27/21 ?0139 06/28/21 ?0133 06/29/21 ?0216 06/30/21 ?0125  ?WBC 9.8 8.1 8.6 11.7* 8.6  ?HGB 8.1* 7.9* 7.7* 8.3* 8.1*  ?HCT 25.1* 23.9* 24.1* 26.2* 27.0*  ?PLT 108* 121* 158 209 232  ?MCV 95.1 93.7 95.6 95.3 99.6  ?MCH 30.7 31.0 30.6 30.2 29.9  ?MCHC 32.3 33.1 32.0 31.7 30.0  ?RDW 15.1 14.6 14.3 14.3 14.0  ?LYMPHSABS 0.2* 0.3* 0.3* 0.5* 0.3*  ?MONOABS 0.2 0.1 0.1 0.1 0.0*  ?EOSABS 0.0 0.0 0.0 0.0 0.0  ?BASOSABS 0.0 0.0 0.0 0.0 0.0  ? ? ?Electrolytes ?Recent Labs  ?Lab 06/23/21 ?1729 06/24/21 ?0420 06/25/21 ?0120 06/25/21 ?0708 06/26/21 ?3785 06/27/21 ?0139 06/28/21 ?0133 06/29/21 ?0216 06/29/21 ?8850 06/30/21 ?0125  ?NA  --    < > 140  --  136 135 134* 132*  --  136  ?K  --    < > 5.0  --  4.9 3.9 3.7 4.0  --  4.0  ?CL  --    < > 114*  --  110 105 102 102  --  102  ?CO2  --    < > 13*  --  12* 18* 20* 18*  --  22  ?GLUCOSE  --    < > 146*  --  135* 84 163* 174*  --  186*  ?BUN  --    < > 101*  --  106* 71* 51* 69*  --  41*  ?CREATININE  --    < > 4.82*  --  4.91* 3.77* 3.25* 4.21*  --  3.36*  ?CALCIUM  --    < > 7.4*  --  7.9* 7.9* 7.6* 7.5*  --  7.6*  ?AST  --    < > 22  --  '19 18 15 17  '$ --  14*  ?ALT  --    < > 16  --  '14 15 11 7  '$ --  <5  ?ALKPHOS  --    < > 55  --  57 53 53 57  --  61  ?BILITOT  --    < > 1.0  --  0.8 1.5* 1.0 0.8  --  0.4  ?ALBUMIN  --    < > 2.0*  --  1.9* 1.8* 1.8* 1.8*  --  2.0*  ?MG  --   --  1.3*  --  2.0 1.7 1.7 1.9  --   --   ?CRP  --   --   --  22.8* 19.4* 14.1*  18.8* 11.1*  --   --   ?PROCALCITON 76.05   < > 64.87  --  40.53 32.79 22.44  --  16.41 11.09  ?LATICACIDVEN 1.2  --   --   --   --   --   --   --   --   --   ?BNP 224.3*  --  322.0*  --  350.2*  338.1* 247.8* 466.3*  --   --   ? < > = values in this interval not displayed.  ? ?Radiology Reports ?DG Chest Port 1 View ? ?Result Date: 06/27/2021 ?CLINICAL DATA:  Shortness of breath. EXAM: PORTABLE C

## 2021-06-30 NOTE — Progress Notes (Signed)
[facility] ?Progress Note ? ?Name: Mackenzie [name] ?MRN: [id number] ?DOB: 1950-06-12 ? ?Chief Complaint:  ?Shortness of breath  ? ?Subjective:  ?her tunneled catheter wasn't yet placed - per primary team report IR is now planning for this procedure Monday early am.  Seen and examined on dialysis.  Blood pressure 122/67 and HR 86.  RIJ nontunneled catheter in place.  Tolerating goal.   ? ?Review of systems:  ?Denies shortness of breath or chest pain  ?Denies n/v ? ? ?Intake/Output Summary (Last 24 hours) at 06/30/2021 0955 ?Last data filed at 06/29/2021 2251 ?Gross per 24 hour  ?Intake 500 ml  ?Output 2284 ml  ?Net -1784 ml  ? ? ?Vitals:  ?Vitals:  ? 06/29/21 2301 06/30/21 0000 06/30/21 0400 06/30/21 0451  ?BP: 114/72 105/61 118/75   ?Pulse: 86 83 79   ?Resp: '20 18 20   '$ ?Temp: 98.3 ?F (36.8 ?C)  98 ?F (36.7 ?C)   ?TempSrc: Oral  Oral   ?SpO2: 97%  96%   ?Weight:    73.6 kg  ?Height:      ?  ? ?Physical Exam:  ? ?General adult female in bed in no acute distress ?HEENT normocephalic atraumatic extraocular movements intact sclera anicteric ?Neck supple trachea midline ?Lungs clear to auscultation and normal work of breathing at rest on room air  ?Heart S1S2 no rub ?Abdomen soft nontender nondistended ?Extremities no pitting edema  ?Psych normal mood and affect ?Neuro - alert and conversant; provides hx and follows commands ?Access :RIJ nontunneled HD catheter ? ?Medications reviewed  ? ?Labs:  ? ?  Latest Ref Rng & Units 06/30/2021  ?  1:25 AM 06/29/2021  ?  2:16 AM 06/28/2021  ?  1:33 AM  ?BMP  ?Glucose 70 - 99 mg/dL 186   174   163    ?BUN 8 - 23 mg/dL 41   69   51    ?Creatinine 0.44 - 1.00 mg/dL 3.36   4.21   3.25    ?Sodium 135 - 145 mmol/L 136   132   134    ?Potassium 3.5 - 5.1 mmol/L 4.0   4.0   3.7    ?Chloride 98 - 111 mmol/L 102   102   102    ?CO2 22 - 32 mmol/L '22   18   20    '$ ?Calcium 8.9 - 10.3 mg/dL 7.6   7.5   7.6    ? ? ? ?Assessment/Plan:  ? ?# AKI  ?- Pauci immune glomerulonephritis.  She has  some uremic symptoms and metabolic derangements for which HD is indicated.  Started on HD on 5/9 ?- I consulted IR again to replace non-tunneled catheter with a tunneled catheter - this is now scheduled for Monday ?- HD today per a TTS schedule  ?- Continue prednisone which I have reduced to 40 mg daily  ?- If no evidence of renal recovery would taper this down as an outpatient  ?- she may have ultimately progressed to ESRD.  Follow response to HD and UOP ?- She has been accepted on TTS HD schedule to [facility] as an AKI.  She can start there on 5/16  ?- on protonix for GI ppx ?- rituxan due later in May if continued  ?- eliquis is on hold for tunneled catheter placement and can be resumed after the same  ?  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 ?# Heart failure with preserved EF  ?- volume status improved  ?- lasix 80 mg daily on non-HD days  ?  ?# CAP  ?- abx per primary team  ?  ?# E coli Bacteremia ?- note e coli and enterobacterales in blood last check  ?- updated blood cultures which are negative ?- I've requested tunneled catheter ?- abx per primary team  ?  ?# Metabolic acidosis  ?- secondary to renal failure  ?- now managed with HD ?  ?# Hyperkalemia ?- resolved on HD ?  ?# HTN  ?- optimize volume with HD ?  ?# Anemia CKD  ?- increased aranesp to 100 mcg weekly on Tuesdays  ?- IV iron x 2 doses ?  ?Dispo - from a renal standpoint she is stable to be discharged after her tunneled dialysis catheter is placed - it looks like this is scheduled for Monday, 5/15 ?  ?Claudia Desanctis, MD ?10:09 AM ?06/30/2021 ? ?

## 2021-07-01 LAB — GLUCOSE, CAPILLARY
Glucose-Capillary: 120 mg/dL — ABNORMAL HIGH (ref 70–99)
Glucose-Capillary: 172 mg/dL — ABNORMAL HIGH (ref 70–99)
Glucose-Capillary: 291 mg/dL — ABNORMAL HIGH (ref 70–99)
Glucose-Capillary: 227 mg/dL — ABNORMAL HIGH (ref 70–99)

## 2021-07-01 NOTE — Progress Notes (Signed)
?                                  PROGRESS NOTE                                             ?                                                                                                                     ?                                         ? ? Patient Demographics:  ? ? Mackenzie [name], is a 71 y.o. female, DOB - 1951-01-13, EYC:[id number] ? ?Outpatient Primary MD for the patient is [name], MD    LOS - 7  Admit date - 06/24/2021   ? ?No chief complaint on file. ?    ? ?Brief Narrative (HPI from H&P)   71 y.o. female with HTN, CAD, LBBB, DM 2, HFpEF, CKD 5 and atrial fibrillation, her daughter noticed that patient started having progressive fatigue, malaise and then became progressively more short of breath with mild cough.  She saw her PCP where she was diagnosed with UTI, she was subsequently admitted to [facility] hospital for sepsis, pneumonia, acute hypoxic respiratory failure, AKI on CKD 5, she was then transferred to [facility] for further care upon family request. ? ? Subjective:  ? ?Patient in bed, appears comfortable, denies any headache, no fever, no chest pain or pressure, no shortness of breath , no abdominal pain. No new focal weakness. ? ? Assessment  & Plan :  ? ? ?Acute hypoxic respiratory failure due to acute on chronic diastolic CHF EF 14% on last echocardiogram 1 year ago along with community-acquired pneumonia with severe sepsis upon admission at [facility] -pneumonia has been clinically treated and resolved, fluid overload much improved after she was started on dialysis.  She is now on room air.  This problem has resolved ? 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  Outpatient dialysis  also being arranged ? ?E. coli bacteremia.  Likely source UTI which was diagnosed at PCP office.  Continue IV antibiotics - cefazolin will complete total of 10 days which will be 07/02/2021.  Clinically sepsis pathophysiology has defervesced, repeat blood cultures drawn 06/26/2021 thus far negative on 06/30/2021. ? ?Paroxysmal atrial fibrillation (HCC) with [place]i vasc 2 score of 5.  Continue beta-blocker and Eliquis combination.   ? ?Acute on chronic diastolic CHF with EF 57% on echocardiogram in November 2022.  Kindly see #1 above. ? ?HTN (hypertension) - currently on beta-blocker, monitor with as needed hydralazine in addition to beta-blocker. ? ?CAD with LBBB - B blocker & Statin, no acute issues. ? ?Anemia of chronic disease.  Monitor. ? ?Hypomagnesemia.  Replaced. ? ?DM2 (diabetes mellitus, type 2) ([place]) - ISS ? ?Lab Results  ?Component Value Date  ? HGBA1C 5.8 (H) 01/15/2021  ? ? ?CBG (last 3)  ?Recent Labs  ?  06/30/21 ?1534 06/30/21 ?2052 07/01/21 ?0816  ?GLUCAP 211* 207* 120*  ? ? ?   ? ?Condition - Extremely Guarded ? ?Family Communication  :  ? ?Daughter bedside on 06/26/2021, 06/27/2021, 06/28/2021, 06/29/2021, 06/30/2021, 07/01/21 ? ?Daughter April over the phone on 06/26/2021 ? ?Code Status :  Full ? ?Consults  :  Renal, IR ? ?PUD Prophylaxis :   ? ? Procedures  :    ? ?Right IJ HD Temp Cath placed by IR on 06/26/2021 ? ?Renal US  - Non acute ? ?CT  -  Large bilateral airspace opacities are noted, right greater than left, consistent with multifocal pneumonia. Coronary artery calcifications are noted. No acute abnormality  seen in the abdomen or pelvis. Aortic Atherosclerosis ? ?   ? ?Disposition Plan  :   ? ?Status is: Inpatient ? ?DVT Prophylaxis  :   ? ?SCDs Start: 06/24/21 2213 ? ?  ? ?Lab Results  ?Component Value Date  ? PLT 232 06/30/2021  ? ? ?Diet :  ?Diet Order   ? ?       ?  Diet NPO time specified Except for: Sips with Meds  Diet effective midnight       ?  ?  Diet renal with fluid restriction Fluid restriction: 1200 mL Fluid; Room service appropriate? Yes; Fluid consistency: Thin  Diet effective now       ?  ? ?  ?  ? ?  ?  ? ?Inpatient Medications ? ?Scheduled Meds: ? atorvastatin  40 mg Oral q1800  ? Chlorhexidine Gluconate Cloth  6 each Topical Q0600  ? [START ON 07/03/2021] darbepoetin (ARANESP) injection - NON-DIALYSIS  100 mcg Subcutaneous Q Tue-1800  ? furosemide  80 mg Oral Q M,W,F  ? furosemide  80 mg Oral Q Sun  ? insulin aspart  0-6 Units Subcutaneous TID WC  ? metoprolol tartrate  12.5 mg Oral BID  ? pantoprazole  40 mg Oral Daily  ? predniSONE  40 mg Oral Q breakfast  ? ?Continuous Infusions: ?  ceFAZolin (ANCEF) IV Stopped (06/30/21 2135)  ? [START ON 07/02/2021]  ceFAZolin (ANCEF) IV    ? ?PRN Meds:.acetaminophen **OR** acetaminophen, albuterol, benzonatate, hydrALAZINE ? Time Spent in minutes  30 ? ? ?Lala Lund M.D on 07/01/2021 at 9:15 AM ? ?To page go to [email or web address]  ? ?[facility] -  Office  [phone] ? ?See all Orders from today for further details ? ? ? Objective:  ? ?Vitals:  ? 06/30/21 2100 07/01/21 0043 07/01/21 0405 07/01/21 0828  ?BP: 114/61 115/69 107/68  125/67  ?Pulse: 89 83 88 82  ?Resp: '20 18 17 16  '$ ?Temp: 98.2 ?F (36.8 ?C) 98 ?F (36.7 ?C) 98.6 ?F (37 ?C) 98 ?F (36.7 ?C)  ?TempSrc: Oral Oral Oral Oral  ?SpO2:  98% 96% 94%  ?Weight:      ?Height:      ? ? ?Wt Readings from Last 3 Encounters:  ?06/30/21 71 kg  ?06/22/21 72.6 kg  ?05/07/21 78.9 kg  ? ? ? ?Intake/Output Summary (Last 24 hours) at 07/01/2021 0915 ?Last data filed at 06/30/2021 2200 ?Gross per 24 hour  ?Intake 220 ml   ?Output 1813 ml  ?Net -1593 ml  ? ? ? ? ? ?Physical Exam ? ?Awake Alert, No new F.N deficits, Normal affect ?[place].AT,PERRAL ?Supple Neck, No JVD,   ?Symmetrical Chest wall movement, Good air movement bilaterally, CTAB ?RRR,No Gallops, Rubs or new Murmurs,  ?+ve B.Sounds, Abd Soft, No tenderness,   ?No Cyanosis, right IJ temporary dialysis catheter in place. ? ? Data Review:  ? ? ?CBC ?Recent Labs  ?Lab 06/26/21 ?0254 06/27/21 ?0139 06/28/21 ?0133 06/29/21 ?0216 06/30/21 ?0125  ?WBC 9.8 8.1 8.6 11.7* 8.6  ?HGB 8.1* 7.9* 7.7* 8.3* 8.1*  ?HCT 25.1* 23.9* 24.1* 26.2* 27.0*  ?PLT 108* 121* 158 209 232  ?MCV 95.1 93.7 95.6 95.3 99.6  ?MCH 30.7 31.0 30.6 30.2 29.9  ?MCHC 32.3 33.1 32.0 31.7 30.0  ?RDW 15.1 14.6 14.3 14.3 14.0  ?LYMPHSABS 0.2* 0.3* 0.3* 0.5* 0.3*  ?MONOABS 0.2 0.1 0.1 0.1 0.0*  ?EOSABS 0.0 0.0 0.0 0.0 0.0  ?BASOSABS 0.0 0.0 0.0 0.0 0.0  ? ? ?Electrolytes ?Recent Labs  ?Lab 06/25/21 ?0120 06/25/21 ?0708 06/26/21 ?3794 06/27/21 ?0139 06/28/21 ?0133 06/29/21 ?0216 06/29/21 ?3276 06/30/21 ?0125  ?NA 140  --  136 135 134* 132*  --  136  ?K 5.0  --  4.9 3.9 3.7 4.0  --  4.0  ?CL 114*  --  110 105 102 102  --  102  ?CO2 13*  --  12* 18* 20* 18*  --  22  ?GLUCOSE 146*  --  135* 84 163* 174*  --  186*  ?BUN 101*  --  106* 71* 51* 69*  --  41*  ?CREATININE 4.82*  --  4.91* 3.77* 3.25* 4.21*  --  3.36*  ?CALCIUM 7.4*  --  7.9* 7.9* 7.6* 7.5*  --  7.6*  ?AST 22  --  '19 18 15 17  '$ --  14*  ?ALT 16  --  '14 15 11 7  '$ --  <5  ?ALKPHOS 55  --  57 53 53 57  --  61  ?BILITOT 1.0  --  0.8 1.5* 1.0 0.8  --  0.4  ?ALBUMIN 2.0*  --  1.9* 1.8* 1.8* 1.8*  --  2.0*  ?MG 1.3*  --  2.0 1.7 1.7 1.9  --   --   ?CRP  --  22.8* 19.4* 14.1* 18.8* 11.1*  --   --   ?PROCALCITON 64.87  --  40.53 32.79 22.44  --  16.41 11.09  ?BNP 322.0*  --  350.2* 338.1* 247.8* 466.3*  --   --   ? ?Radiology Reports ?No results found.  ? ? ?

## 2021-07-01 NOTE — Progress Notes (Signed)
[facility] ?Progress Note ? ?Name: Mackenzie [name] ?MRN: [id number] ?DOB: 1950/04/11 ? ?Chief Complaint:  ?Shortness of breath  ? ?Subjective:  ?her tunneled catheter is still to be placed tomorrow am hopefully.  Last HD on 5/13 with 1.8 kg.  There is no urine output charted for 5/13.  Her daughter thinks it would be easier to try the commode than purewick.  They asked about renal diet and we discussed this today.  Her daughter is going to cancel her next appt with Dr. Geanie Kenning at our office as she is going to [facility] for HD/AKI.  ? ?Review of systems:   ?Denies shortness of breath or chest pain  ?Denies n/v ?Not urinating  ? ? ?Intake/Output Summary (Last 24 hours) at 07/01/2021 1046 ?Last data filed at 06/30/2021 2200 ?Gross per 24 hour  ?Intake 220 ml  ?Output 1813 ml  ?Net -1593 ml  ? ? ?Vitals:  ?Vitals:  ? 06/30/21 2100 07/01/21 0043 07/01/21 0405 07/01/21 0828  ?BP: 114/61 115/69 107/68 125/67  ?Pulse: 89 83 88 82  ?Resp: '20 18 17 16  '$ ?Temp: 98.2 ?F (36.8 ?C) 98 ?F (36.7 ?C) 98.6 ?F (37 ?C) 98 ?F (36.7 ?C)  ?TempSrc: Oral Oral Oral Oral  ?SpO2:  98% 96% 94%  ?Weight:      ?Height:      ?  ? ?Physical Exam:   ?General adult female in bed in no acute distress ?HEENT normocephalic atraumatic extraocular movements intact sclera anicteric ?Neck supple trachea midline ?Lungs clear to auscultation and normal work of breathing at rest on room air  ?Heart S1S2 no rub ?Abdomen soft nontender nondistended ?Extremities no pitting edema  ?Psych normal mood and affect ?Neuro - alert and conversant; provides hx and follows commands ?Access :RIJ nontunneled HD catheter ? ?Medications reviewed  ? ?Labs:  ? ?  Latest Ref Rng & Units 06/30/2021  ?  1:25 AM 06/29/2021  ?  2:16 AM 06/28/2021  ?  1:33 AM  ?BMP  ?Glucose 70 - 99 mg/dL 186   174   163    ?BUN 8 - 23 mg/dL 41   69   51    ?Creatinine 0.44 - 1.00 mg/dL 3.36   4.21   3.25    ?Sodium 135 - 145 mmol/L 136   132   134    ?Potassium 3.5 - 5.1 mmol/L 4.0   4.0    3.7    ?Chloride 98 - 111 mmol/L 102   102   102    ?CO2 22 - 32 mmol/L '22   18   20    '$ ?Calcium 8.9 - 10.3 mg/dL 7.6   7.5   7.6    ? ? ? ?Assessment/Plan:  ? ?# AKI  ?- Pauci immune glomerulonephritis.  She has some uremic symptoms and metabolic derangements for which HD is indicated.  Started on HD on 5/9 ?- I consulted IR again to replace non-tunneled catheter with a tunneled catheter - this is now scheduled for Monday, 5/15 ?- HD is now per a TTS schedule.  She has been accepted on TTS HD schedule to [facility] as an AKI.  She can start there on 5/16  ?- Continue prednisone which I have reduced to 40 mg daily.  If no evidence of renal recovery would taper this down as an outpatient.  Rituxan due at the end of May if continued though may be less utility   ?- she may have ultimately progressed to ESRD.  Follow response to HD and UOP ?- on protonix for GI ppx ?- eliquis is on hold for tunneled catheter placement and can be resumed after the same  ?- renal panel in am ?  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 ?# Heart failure with preserved EF  ?- volume status improved  ?- lasix 80 mg daily on non-HD days  ?  ?# CAP  ?- abx per primary team  ?  ?# E coli Bacteremia ?- note e coli and enterobacterales  ?- updated blood cultures which are negative (final) ?- I've requested tunneled catheter with IR as above ?- abx per primary team  ?  ?# Metabolic acidosis  ?- secondary to renal failure  ?- now managed with HD ?  ?# Hyperkalemia ?- resolved on HD ?  ?# HTN  ?- optimize volume with HD ?  ?# Anemia CKD  ?- increased aranesp to 100 mcg weekly on Tuesdays  ?- IV iron x 2 doses ?  ?Dispo - from a renal standpoint she is stable to be discharged after her tunneled dialysis catheter is placed - it looks like this is scheduled for Monday, 5/15 ?  ?Claudia Desanctis, MD ?07/01/2021 ?11:13 AM ? ? ?

## 2021-07-02 ENCOUNTER — Inpatient Hospital Stay (HOSPITAL_COMMUNITY): Payer: Medicare Other

## 2021-07-02 HISTORY — PX: IR FLUORO GUIDE CV LINE RIGHT: IMG2283

## 2021-07-02 HISTORY — PX: IR US GUIDE VASC ACCESS RIGHT: IMG2390

## 2021-07-02 LAB — RENAL FUNCTION PANEL
Albumin: 2.2 g/dL — ABNORMAL LOW (ref 3.5–5.0)
Anion gap: 13 (ref 5–15)
BUN: 43 mg/dL — ABNORMAL HIGH (ref 8–23)
CO2: 21 mmol/L — ABNORMAL LOW (ref 22–32)
Calcium: 7.7 mg/dL — ABNORMAL LOW (ref 8.9–10.3)
Chloride: 98 mmol/L (ref 98–111)
Creatinine, Ser: 4.09 mg/dL — ABNORMAL HIGH (ref 0.44–1.00)
GFR, Estimated: 11 mL/min — ABNORMAL LOW (ref >60)
Glucose, Bld: 153 mg/dL — ABNORMAL HIGH (ref 70–99)
Phosphorus: 4.2 mg/dL (ref 2.5–4.6)
Potassium: 3.7 mmol/L (ref 3.5–5.1)
Sodium: 132 mmol/L — ABNORMAL LOW (ref 135–145)

## 2021-07-02 LAB — GLUCOSE, CAPILLARY
Glucose-Capillary: 98 mg/dL (ref 70–99)
Glucose-Capillary: 154 mg/dL — ABNORMAL HIGH (ref 70–99)

## 2021-07-02 MED ORDER — FENTANYL CITRATE (PF) 100 MCG/2ML IJ SOLN
INTRAMUSCULAR | Status: AC | PRN
  Administered 2021-07-02: 25 ug via INTRAVENOUS

## 2021-07-02 MED ORDER — CEFAZOLIN SODIUM-DEXTROSE 2-4 GM/100ML-% IV SOLN
INTRAVENOUS | Status: AC
  Filled 2021-07-02: qty 100

## 2021-07-02 MED ORDER — CHLORHEXIDINE GLUCONATE 4 % EX LIQD
CUTANEOUS | Status: AC
  Filled 2021-07-02: qty 15

## 2021-07-02 MED ORDER — MIDAZOLAM HCL 2 MG/2ML IJ SOLN
INTRAMUSCULAR | Status: AC
  Filled 2021-07-02: qty 2

## 2021-07-02 MED ORDER — MIDAZOLAM HCL 2 MG/2ML IJ SOLN
INTRAMUSCULAR | Status: AC | PRN
  Administered 2021-07-02: 1 mg via INTRAVENOUS

## 2021-07-02 MED ORDER — PREDNISONE 20 MG PO TABS
20.0000 mg | ORAL_TABLET | Freq: Every day | ORAL | Status: DC
Start: 2021-07-03 — End: 2021-07-02

## 2021-07-02 MED ORDER — LIDOCAINE-EPINEPHRINE 1 %-1:100000 IJ SOLN
INTRAMUSCULAR | Status: AC
  Administered 2021-07-02: 15 mL
  Filled 2021-07-02: qty 1

## 2021-07-02 MED ORDER — FUROSEMIDE 80 MG PO TABS: ORAL_TABLET | ORAL | 0 refills | Status: DC

## 2021-07-02 MED ORDER — HEPARIN SODIUM (PORCINE) 1000 UNIT/ML IJ SOLN
INTRAMUSCULAR | Status: AC
  Administered 2021-07-02: 3.2 mL
  Filled 2021-07-02: qty 10

## 2021-07-02 MED ORDER — FENTANYL CITRATE (PF) 100 MCG/2ML IJ SOLN
INTRAMUSCULAR | Status: AC
  Filled 2021-07-02: qty 2

## 2021-07-02 NOTE — Progress Notes (Signed)
PT Cancellation Note ? ?Patient Details ?Name: Mackenzie [name] ?MRN: [id number] ?DOB: [birth year] ? ? ?Cancelled Treatment:    Reason Eval/Treat Not Completed: Patient at procedure or test/unavailable ? ?Will attempt to see after procedure as daughter is now inquiring about pt going to SNF.  ? ? ?Arby Barrette, PT ?Acute Rehabilitation Services  ?Pager [phone] ?Office [phone] ? ?Mackenzie [name] ?07/02/2021, 11:02 AM ?

## 2021-07-02 NOTE — Procedures (Signed)
Interventional Radiology Procedure Note ? ?Procedure:  ?1) Placement of right IJ tunneled hemodialysis catheter ?2) Removal of indwelling right IJ temporary hemodialysis catheter ? ?Findings: Please refer to procedural dictation for full description. 19 cm tunneled HD, catheter tip in right atriu. ? ?Complications: None immediate ? ?Estimated Blood Loss: < 5 ml ? ?Recommendations: ?Catheter ready for immediate use. ? ? ?Ruthann Cancer, MD ? ? ?

## 2021-07-02 NOTE — Progress Notes (Signed)
  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 At this point unclear whether this represents AKI in the setting of her acute illness vs progression to ESRD. Felt to be more likely the latter. HD initiated 5/9 due to electrolyte derangements and uremia. Last HD session 5/13.  -IR to place non-tunneled HD cath today -Continue prednisone '40mg'$  daily, taper as outpatient if no renal recovery -Protonix for GI ppx -Due for Rituxan end of May if still indicated at that time -Accepted for outpatient HD TTS at [facility] HFpEF- Clinically appears euvolemic. Continue Lasix '80mg'$  daily on non-HD days CAP- Resolved E Coli Bacteremia- resolved; repeat cultures were negative Metabolic Acidosis- manage with HD Anemia of CKD- Aranesp 153mg weekly (qTuesday)  OK for discharge from renal standpoint after placement of tunneled dialysis catheter.   Subjective:   No acute events overnight. Patient feels well without complaints. Daughter inquiring about rehab vs home with home health services. They are hoping she will go [place] rehab [place]  Objective:   BP 136/63 (BP Location: Left Arm)   Pulse 81   Temp 97.6 F (36.4 C) (Oral)   Resp 16   Ht '5\' 6"'$  (1.676 m)   Wt 72.3 kg   SpO2 98%   BMI 25.73 kg/m   Intake/Output Summary (Last 24 hours) at 07/02/2021 0858 Last data filed at 07/02/2021 0538 Gross per 24 hour  Intake 100 ml  Output --  Net 100 ml   Weight change: -0.7 kg  Physical Exam: Gen: alert, resting comfortably CVS: RRR, normal S1/S2 without m/r/g Resp: normal effort on 2L [place] Abd: soft, nontender Ext: no pedal edema   Imaging: No results found.  Labs: BMET Recent Labs  Lab 06/26/21 0254 06/27/21 0139 06/28/21 0133 06/29/21 0216 06/30/21 0125 07/02/21 0109  NA 136 135 134* 132* 136 132*  K 4.9 3.9 3.7 4.0 4.0 3.7  CL 110 105 102 102 102 98  CO2 12* 18* 20* 18*  22 21*  GLUCOSE 135* 84 163* 174* 186* 153*  BUN 106* 71* 51* 69* 41* 43*  CREATININE 4.91* 3.77* 3.25* 4.21* 3.36* 4.09*  CALCIUM 7.9* 7.9* 7.6* 7.5* 7.6* 7.7*  PHOS  --   --   --   --   --  4.2   CBC Recent Labs  Lab 06/27/21 0139 06/28/21 0133 06/29/21 0216 06/30/21 0125  WBC 8.1 8.6 11.7* 8.6  NEUTROABS 6.2 7.4 10.8* 8.3*  HGB 7.9* 7.7* 8.3* 8.1*  HCT 23.9* 24.1* 26.2* 27.0*  MCV 93.7 95.6 95.3 99.6  PLT 121* 158 209 232    Medications:     atorvastatin  40 mg Oral q1800   Chlorhexidine Gluconate Cloth  6 each Topical Q0600   [START ON 07/03/2021] darbepoetin (ARANESP) injection - NON-DIALYSIS  100 mcg Subcutaneous Q Tue-1800   furosemide  80 mg Oral Q M,W,F   furosemide  80 mg Oral Q Sun   insulin aspart  0-6 Units Subcutaneous TID WC   metoprolol tartrate  12.5 mg Oral BID   pantoprazole  40 mg Oral Daily   predniSONE  40 mg Oral Q breakfast     AAlcus Dad MD 07/02/2021, 8:58 AM  PGY-2 [place]

## 2021-07-02 NOTE — Progress Notes (Signed)
New Dialysis Start  ? ?Patient identified as new dialysis start. Kidney Education packet assembled and given. Discussed the following items with patient:   ? ?Current medications and possible changes once started:  Discussed that patient's medications may change over time.  Ex; hypertension medications and diabetes medication.  Nephrologists will adjust as needed. ? ?Fluid restrictions reviewed:  32 oz daily goal:  All liquids count; soups, ice, jello  ? ?Phosphorus and potassium: Handout given showing high potassium and phosphorus foods.  Alternative food and drink options given. ? ?Family support:  Patient's daughter April was present during discussion and participated ? ?Outpatient Clinic Resources:  Discussed roles of Outpatient clinic  staff and advised to make a list of needs, if any, to talk with outpatient staff if needed. ? ?Care plan schedule: Informed patient and family member of Care Plans in outpatient setting and to participate in the care plan.  An invitation would be given from outpatient clinic.  ? ?Dialysis Access Options:  Reviewed access options with patients. Discussed in detail about care at home with new AVG & AVF. Reviewed checking bruit and thrill. If dialysis catheter present, educated that patient could not take showers.  Catheter dressing changes were to be done by outpatient clinic staff only. ?   ?Home therapy options:  Educated patient about home therapy options:  PD vs home hemo.  Patient stated she is not interested at this time in home therapies. ? ?Patient verbalized understanding. Will continue to round on patient during admission.   ? ?Lilia Argue, RN  ?

## 2021-07-02 NOTE — Progress Notes (Signed)
Patient being discharged home per MD orders. AVS teaching given to patient and patient's daughter. Pt had rolling walker delivered to bedside. No further needs identified at this time. Patient discharged via private auto.  ?

## 2021-07-02 NOTE — Progress Notes (Signed)
Pt for d/c today. Met with pt and pt's daughter at bedside. Provided schedule letter today that was received late on Friday. Pt and pt's daughter aware pt needs to f/u at clinic tomorrow for first out-pt treatment. Pt and pt's daughter aware pt needs to arrive at 12:45 to complete paperwork prior to treatment. Contacted [facility] and spoke to [place]. Clinic aware pt will d/c today and start tomorrow. Pt's d/c summary, last renal note, and TDC placement procedure note faxed to clinic for continuation of care. Pt mentioned possible snf placement for rehab. Contacted TOC staff to advise them of pt's inquiry.  ? ?Melven Sartorius ?Renal Navigator ?[phone] ?

## 2021-07-02 NOTE — Discharge Instructions (Addendum)
Follow with Primary MD [name], MD in 7 days  ? ?Get CBC, CMP, 2 view Chest X ray -  checked next visit within 1 week by Primary MD  ? ?Activity: As tolerated with Full fall precautions use walker/cane & assistance as needed ? ?Disposition Home  ? ?Diet: Renal diet with 1.5 L fluid restriction per day.  Check CBGs q. [place]. ? ?Special Instructions: If you have smoked or chewed Tobacco  in the last 2 yrs please stop smoking, stop any regular Alcohol  and or any Recreational drug use. ? ?On your next visit with your primary care physician please Get Medicines reviewed and adjusted. ? ?Please request your Prim.MD to go over all Hospital Tests and Procedure/Radiological results at the follow up, please get all Hospital records sent to your Prim MD by signing hospital release before you go home. ? ?If you experience worsening of your admission symptoms, develop shortness of breath, life threatening emergency, suicidal or homicidal thoughts you must seek medical attention immediately by calling 911 or calling your MD immediately  if symptoms less severe. ? ?You Must read complete instructions/literature along with all the possible adverse reactions/side effects for all the Medicines you take and that have been prescribed to you. Take any new Medicines after you have completely understood and accpet all the possible adverse reactions/side effects.  ? ? ?

## 2021-07-02 NOTE — Progress Notes (Signed)
Physical Therapy Treatment ?Patient Details ?Name: Mackenzie [name] ?MRN: [id number] ?DOB: 30-Oct-1950 ?Today's Date: 07/02/2021 ? ? ?History of Present Illness 71 y.o. female who is admitted to [facility] 06/24/2021 by way of transfer from [facility] (adm 06/22/21 for sepsis due to UTI and multifocal pneumonia) for evaluation and management of acute kidney injury superimposed on CKD 4. 5/08 pt deciding if she wants to start HD  PMH significant for stage IV CKD, paroxysmal atrial fibrillation chronically anticoagulated on Eliquis, chronic diastolic heart failure, hypertension, anemia of chronic kidney disease, type 2 diabetes mellitus ? ?  ?PT Comments  ? ? Patient doing very well with use of RW (minguard to supervision). Patient agreed to continue to use RW when she goes home and work with HHPT to decide when she can stop using it. Daughter present and aware of HHPT recommendation.  ?   ?Recommendations for follow up therapy are one component of a multi-disciplinary discharge planning process, led by the attending physician.  Recommendations may be updated based on patient status, additional functional criteria and insurance authorization. ? ?Follow Up Recommendations ? Home health PT ?  ?  ?Assistance Recommended at Discharge Intermittent Supervision/Assistance  ?Patient can return home with the following A little help with walking and/or transfers;Assistance with cooking/housework;Direct supervision/assist for medications management;Direct supervision/assist for financial management;Help with stairs or ramp for entrance ?  ?Equipment Recommendations ? Rolling walker (2 wheels)  ?  ?Recommendations for Other Services   ? ? ?  ?Precautions / Restrictions Precautions ?Precautions: Fall ?Restrictions ?Weight Bearing Restrictions: No  ?  ? ?Mobility ? Bed Mobility ?Overal bed mobility: Modified Independent ?  ?  ?  ?  ?  ?  ?General bed mobility comments: to EOB without difficulty ?  ? ?Transfers ?Overall transfer level:  Needs assistance ?Equipment used: Rolling walker (2 wheels) ?Transfers: Sit to/from Stand ?Sit to Stand: Supervision ?  ?  ?  ?  ?  ?General transfer comment: pt with proper hand placement sit to stand and stand to sit ?  ? ?Ambulation/Gait ?Ambulation/Gait assistance: Min guard, Supervision ?Gait Distance (Feet): 180 Feet ?Assistive device: Rolling walker (2 wheels) ?Gait Pattern/deviations: Step-through pattern, Decreased stride length, Trunk flexed ?  ?Gait velocity interpretation: 1.31 - 2.62 ft/sec, indicative of limited community ambulator ?  ?General Gait Details: vc for proximity to RW and upright posture; reinforced that she needs to use RW at home until HHPT helps her decide if ok to go without ? ? ?Stairs ?  ?  ?  ?  ?  ? ? ?Wheelchair Mobility ?  ? ?Modified Rankin (Stroke Patients Only) ?  ? ? ?  ?Balance Overall balance assessment: Needs assistance ?Sitting-balance support: No upper extremity supported ?Sitting balance-Leahy Scale: Good ?  ?  ?  ?Standing balance-Leahy Scale: Fair ?Standing balance comment: can statically stand without UE support ?  ?  ?  ?  ?  ?  ?  ?  ?  ?  ?  ?  ? ?  ?Cognition Arousal/Alertness: Awake/alert ?Behavior During Therapy: [facility] for tasks assessed/performed ?Overall Cognitive Status: History of cognitive impairments - at baseline ?  ?  ?  ?  ?  ?  ?  ?  ?  ?  ?  ?  ?  ?  ?  ?  ?  ?  ?  ? ?  ?Exercises   ? ?  ?General Comments General comments (skin integrity, edema, etc.): Discussion with daughter and pt regarding no need  for SNF. Patient is mobilizing well enough to go home and pt in agreement. ?  ?  ? ?Pertinent Vitals/Pain Pain Assessment ?Pain Assessment: No/denies pain  ? ? ?Home Living   ?  ?  ?  ?  ?  ?  ?  ?  ?  ?   ?  ?Prior Function    ?  ?  ?   ? ?PT Goals (current goals can now be found in the care plan section) Acute Rehab PT Goals ?Patient Stated Goal: return home without needing a RW ?PT Goal Formulation: With patient ?Time For Goal Achievement:  07/09/21 ?Potential to Achieve Goals: Good ?Progress towards PT goals: Progressing toward goals ? ?  ?Frequency ? ? ? Min 3X/week ? ? ? ?  ?PT Plan Current plan remains appropriate  ? ? ?Co-evaluation   ?  ?  ?  ?  ? ?  ?AM-PAC PT "6 Clicks" Mobility   ?Outcome Measure ? Help needed turning from your back to your side while in a flat bed without using bedrails?: None ?Help needed moving from lying on your back to sitting on the side of a flat bed without using bedrails?: None ?Help needed moving to and from a bed to a chair (including a wheelchair)?: A Little ?Help needed standing up from a chair using your arms (e.g., wheelchair or bedside chair)?: A Little ?Help needed to walk in hospital room?: A Little ?Help needed climbing 3-5 steps with a railing? : A Little ?6 Click Score: 20 ? ?  ?End of Session Equipment Utilized During Treatment: Gait belt ?Activity Tolerance: Patient tolerated treatment well ?Patient left: in chair;with call bell/phone within reach ?Nurse Communication: Mobility status (ok to discharge from PT perspective) ?PT Visit Diagnosis: Difficulty in walking, not elsewhere classified (R26.2) ?  ? ? ?Time: 1191-4782 ?PT Time Calculation (min) (ACUTE ONLY): 19 min ? ?Charges:  $Gait Training: 8-22 mins          ?          ? ? ?Arby Barrette, PT ?Acute Rehabilitation Services  ?Pager [phone] ?Office [phone] ? ? ? ?Mackenzie [name] ?07/02/2021, 1:50 PM ? ?

## 2021-07-02 NOTE — Discharge Summary (Signed)
?                                                                                ? ?Mackenzie [name] YIA:[id number] DOB: Jul 22, 1950 DOA: 06/24/2021 ? ?PCP: [name], MD ? ?Admit date: 06/24/2021  Discharge date: 07/02/2021 ? ?Admitted From: Home   disposition: Home ? ? ?Recommendations for Outpatient Follow-up:  ? ?Follow up with PCP in 1-2 weeks ? ?PCP Please obtain BMP/CBC, 2 view CXR in 1week,  (see Discharge instructions)  ? ?PCP Please follow up on the following pending results: Kindly check CBC, BMP, magnesium level in 7 to 10 days.  Review radiology reports in detail next visit ? ? ?Home Health: PT, RN if she qualifies ?Equipment/Devices: As below ?Consultations: Nephrology, IR ?Discharge Condition: Stable    ?CODE STATUS: Full    ?Diet Recommendation: Renal diet with 1.5 L fluid restriction per day ? ?Chief complaint.  Fevers.  Transferred from [facility]. ?  ? ?Brief history of present illness from the day of admission and additional interim summary   ? ?71 y.o. female with HTN, CAD, LBBB, DM 2, HFpEF, CKD 5 and atrial fibrillation, her daughter noticed that patient started having progressive fatigue, malaise and then became progressively more short of breath with mild cough.  She saw her PCP where she was diagnosed with UTI, she was subsequently admitted to [facility] hospital for sepsis, pneumonia, acute hypoxic respiratory failure, AKI on CKD 5, she was then transferred to [facility] for further care upon family request. ? ?                                                               Hospital Course  ? ? ?Acute hypoxic respiratory failure due to acute on chronic diastolic CHF EF 70% on last echocardiogram 1 year ago along with community-acquired pneumonia with severe sepsis upon admission at [facility] -pneumonia has been clinically treated and resolved, fluid overload much improved after she was started on dialysis.  She  is now on room air.  This problem has resolved ?  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 ?  ?E. coli bacteremia.  Likely source UTI which was diagnosed at PCP office.  She finished total 10 days of IV cephalosporin today, repeat blood cultures drawn this hospital admission are  negative. ?  ?Paroxysmal atrial fibrillation (HCC) with [place] vasc 2 score of 5.  Continue beta-blocker and Eliquis combination.   ?  ?Acute on chronic diastolic CHF with EF 84% on echocardiogram in November 2022.  Kindly see #1 above. ?  ?HTN (hypertension) - resume home regimen upon discharge. ?  ?CAD with LBBB - B blocker & Statin, no acute issues. ?  ?Anemia of chronic disease.  Monitor. ?  ?Hypomagnesemia.  Replaced. ?  ?DM2 (diabetes mellitus, type 2) ([place]) -continue home regimen and follow with PCP for CBG monitoring  ? ?Lab Results  ?Component Value Date  ? HGBA1C 5.8 (H) 01/15/2021  ? ? ?Discharge diagnosis   ? ? ?Principal Problem: ?  Acute renal failure superimposed on stage 4 chronic kidney disease ([place]) ?Active Problems: ?  Metabolic acidosis ?  Hyperkalemia ?  Atrial fibrillation ([place]) ?  Chronic diastolic CHF (congestive heart failure) ([place]) ?  HTN (hypertension) ?  Normocytic anemia ?  Acute respiratory failure with hypoxia ([place]) ?  Severe  sepsis ([place]) ?  UTI (urinary tract infection) ?  E coli bacteremia ?  CAP (community acquired pneumonia) ?  DM2 (diabetes mellitus, type 2) ([place]) ? ? ? ?Discharge instructions   ? ?Discharge Instructions   ? ? Discharge instructions   Complete by: As directed ?  ? Follow with Primary MD [name], MD in 7 days  ? ?Get CBC, CMP, 2 view Chest X ray -  checked next visit within 1 week by Primary MD  ? ?Activity: As tolerated with Full fall precautions use walker/cane & assistance as needed ? ?Disposition Home  ? ?Diet: Renal diet with 1.5 L fluid restriction per day.  Check CBGs q. [place]. ? ?Special Instructions: If you have smoked or chewed Tobacco  in the last 2 yrs please stop smoking, stop any regular Alcohol  and or any Recreational drug use. ? ?On your next visit with your primary care physician please Get Medicines reviewed and adjusted. ? ?Please request your Prim.MD to go over all Hospital Tests and Procedure/Radiological results at the follow up, please get all Hospital records sent to your Prim MD by signing hospital release before you go home. ? ?If you experience worsening of your admission symptoms, develop shortness of breath, life threatening emergency, suicidal or homicidal thoughts you must seek medical attention immediately by calling 911 or calling your MD immediately  if symptoms less severe. ? ?You Must read complete instructions/literature along with all the possible adverse reactions/side effects for all the Medicines you take and that have been prescribed to you. Take any new Medicines after you have completely understood and accpet all the possible adverse reactions/side effects.  ? Increase activity slowly   Complete by: As directed ?  ? ?  ? ? ?Discharge Medications  ? ?Allergies as of 07/02/2021   ?No Known Allergies ?  ? ?  ?Medication List  ?  ? ?STOP taking these medications   ? ?azithromycin 500 MG tablet ?Commonly known as: Zithromax ?  ?spironolactone 25 MG tablet ?Commonly known  as: ALDACTONE ?  ? ?  ? ?TAKE these medications   ? ?acetaminophen 325 MG tablet ?Commonly known as: TYLENOL ?Take 2 tablets (650 mg total) by mouth every 6 (six) hours as needed for mild pain (or Fever >/= 101). ?  ?albuterol 108 (90 Base) MCG/ACT inhaler ?Commonly known as: VENTOLIN HFA ?Inhale 2 puffs into the lungs daily as needed for wheezing or shortness of breath. ?  ?  amLODipine 5 MG tablet ?Commonly known as: NORVASC ?Take 1 tablet (5 mg total) by mouth daily. ?  ?apixaban 5 MG Tabs tablet ?Commonly known as: ELIQUIS ?Take 1 tablet (5 mg total) by mouth 2 (two) times daily. ?  ?atorvastatin 40 MG tablet ?Commonly known as: LIPITOR ?Take 1 tablet (40 mg total) by mouth daily at 6 PM. ?  ?benzonatate 100 MG capsule ?Commonly known as: TESSALON ?Take 1 capsule (100 mg total) by mouth 3 (three) times daily as needed for cough. ?  ?blood glucose meter kit and supplies Kit ?Dispense based on patient and insurance preference. Use up to four times daily as directed. ?  ?ceFEPIme 2 g in sodium chloride 0.9 % 100 mL ?Inject 2 g into the vein daily. ?  ?ferrous sulfate 325 (65 FE) MG tablet ?Take 1 tablet (325 mg total) by mouth daily. ?  ?furosemide 80 MG tablet ?Commonly known as: LASIX ?Take 1 pill on Monday, Wednesday, Friday and Sunday. ?What changed:  ?medication strength ?how much to take ?how to take this ?when to take this ?additional instructions ?  ?insulin aspart 100 UNIT/ML injection ?Commonly known as: novoLOG ?Inject 0-9 Units into the skin 3 (three) times daily with meals. ?  ?ipratropium-albuterol 0.5-2.5 (3) MG/3ML Soln ?Commonly known as: DUONEB ?Take 3 mLs by nebulization every 6 (six) hours. ?  ?metoprolol tartrate 25 MG tablet ?Commonly known as: LOPRESSOR ?Take 0.5 tablets (12.5 mg total) by mouth 2 (two) times daily. ?  ?ondansetron 4 MG tablet ?Commonly known as: ZOFRAN ?Take 1 tablet (4 mg total) by mouth every 6 (six) hours as needed for nausea. ?  ?ondansetron 4 MG/2ML Soln  injection ?Commonly known as: ZOFRAN ?Inject 2 mLs (4 mg total) into the vein every 6 (six) hours as needed for nausea. ?  ?pantoprazole 40 MG tablet ?Commonly known as: Protonix ?Take 1 tablet (40 mg total) by mouth dail

## 2021-07-02 NOTE — TOC Transition Note (Signed)
PTransition of Care (TOC) - CM/SW Discharge Note ? ? ?Patient Details  ?Name: Mackenzie [name] ?MRN: [id number] ?Date of Birth: 27-Aug-1950 ? ?Transition of Care (TOC) CM/SW Contact:  ?Cyndi Bender, RN ?Phone Number: ?07/02/2021, 2:31 PM ? ? ?Clinical Narrative:    ?Patient stable for discharge. Orders for Home health and DME. Patient deferred to [facility] to find highly rated agency. Cory with [facility] accepted referral. Patient agreeable to use in house provider for walker. Spoke to [place] with [employer] and walker will be delivered to room prior to discharge. Daughter at bedside and can transport home. Address, Phone number and PCP verified.  ?Final next level of care: [place] ?Barriers to Discharge: Barriers Resolved ? ? ?Patient Goals and CMS Choice ?Patient states their goals for this hospitalization and ongoing recovery are:: go home ?CMS Medicare.gov Compare Post Acute Care list provided to:: Patient ?Choice offered to / list presented to : Patient ? ?Discharge Placement ?  ?          home ?  ?  ?  ?  ? ?Discharge Plan and Services ?  ?Discharge Planning Services: CM Consult ?Post Acute Care Choice: Durable Medical Equipment, Home Health, Dialysis          ?DME Arranged: Walker rolling ?DME Agency: [employer] ?Date DME Agency Contacted: 07/02/21 ?Time DME Agency Contacted: [phone] ?Representative spoke with at DME Agency: [place] ?HH Arranged: PT ?[place] Agency: [place] ?Date HH Agency Contacted: 07/02/21 ?Time [place]: 9833 ?Representative spoke with at [place]: Tommi Rumps ? ?Social Determinants of Health (SDOH) Interventions ?  ? ? ?Readmission Risk Interventions ? ?  07/02/2021  ?  2:20 PM  ?Readmission Risk Prevention Plan  ?Transportation Screening Complete  ?Medication Review Press photographer) Complete  ?PCP or Specialist appointment within 3-5 days of discharge Complete  ?[place] or Home Care Consult Complete  ?SW Recovery Care/Counseling Consult Complete  ?Palliative Care Screening Not  Applicable  ?[place] Not Applicable  ? ? ? ? ? ?

## 2021-07-07 DIAGNOSIS — E039 Hypothyroidism, unspecified: Secondary | ICD-10-CM | POA: Insufficient documentation

## 2021-07-07 DIAGNOSIS — D509 Iron deficiency anemia, unspecified: Secondary | ICD-10-CM | POA: Insufficient documentation

## 2021-07-17 ENCOUNTER — Other Ambulatory Visit (HOSPITAL_COMMUNITY): Payer: Self-pay | Admitting: *Deleted

## 2021-07-18 ENCOUNTER — Encounter (HOSPITAL_COMMUNITY)
Admission: RE | Admit: 2021-07-18 | Discharge: 2021-07-18 | Disposition: A | Discharge status: Home / Self Care | Payer: Medicare Other | Source: Ambulatory Visit | Attending: Nephrology | Admitting: Nephrology

## 2021-07-18 DIAGNOSIS — N057 Unspecified nephritic syndrome with diffuse crescentic glomerulonephritis: Secondary | ICD-10-CM | POA: Diagnosis present

## 2021-07-18 MED ORDER — SODIUM CHLORIDE 0.9 % IV SOLN
1000.0000 mg | Freq: Once | INTRAVENOUS | Status: AC
  Administered 2021-07-18: 1000 mg via INTRAVENOUS
  Filled 2021-07-18: qty 100

## 2021-07-18 MED ORDER — ACETAMINOPHEN 325 MG PO TABS: 650.0000 mg | ORAL_TABLET | Freq: Once | ORAL | Status: AC

## 2021-07-18 MED ORDER — METHYLPREDNISOLONE SODIUM SUCC 125 MG IJ SOLR: 125.0000 mg | Freq: Once | INTRAMUSCULAR | Status: AC

## 2021-07-18 MED ORDER — DIPHENHYDRAMINE HCL 50 MG/ML IJ SOLN
INTRAMUSCULAR | Status: AC
  Administered 2021-07-18: 25 mg via INTRAVENOUS
  Filled 2021-07-18: qty 1

## 2021-07-18 MED ORDER — METHYLPREDNISOLONE SODIUM SUCC 125 MG IJ SOLR
INTRAMUSCULAR | Status: AC
  Administered 2021-07-18: 125 mg via INTRAVENOUS
  Filled 2021-07-18: qty 2

## 2021-07-18 MED ORDER — ACETAMINOPHEN 325 MG PO TABS
ORAL_TABLET | ORAL | Status: AC
  Administered 2021-07-18: 650 mg via ORAL
  Filled 2021-07-18: qty 2

## 2021-07-18 MED ORDER — DIPHENHYDRAMINE HCL 50 MG/ML IJ SOLN: 25.0000 mg | Freq: Once | INTRAMUSCULAR | Status: AC

## 2021-10-24 ENCOUNTER — Ambulatory Visit (INDEPENDENT_AMBULATORY_CARE_PROVIDER_SITE_OTHER): Payer: Medicare Other | Admitting: Vascular Surgery

## 2021-10-24 ENCOUNTER — Encounter: Payer: Self-pay | Admitting: Vascular Surgery

## 2021-10-24 VITALS — BP 126/73 | HR 74 | Temp 96.6°F | Ht 66.0 in | Wt 152.0 lb

## 2021-10-24 DIAGNOSIS — Z992 Dependence on renal dialysis: Secondary | ICD-10-CM

## 2021-10-24 DIAGNOSIS — N186 End stage renal disease: Secondary | ICD-10-CM

## 2021-10-24 NOTE — Progress Notes (Signed)
  Vascular and Vein Specialist of [place]  Patient name: Mackenzie [name] MRN: [id number] DOB: 12/30/1950 Sex: female  REASON FOR CONSULT: Evaluation for long-term hemodialysis access  HPI: Mackenzie [name] is a 71 y.o. female, who is here today for discussion of hemodialysis options.  She is here today with her daughter.  He had initiation of dialysis with a tunneled hemodialysis catheter.  She is currently dialyzed at [facility] in [place].  He has had no issues of infection of her catheter today.  She is here today to discuss long-term arm access.  She does not have a pacemaker.  She is on Eliquis.  She is right-handed.  She has history of atrial fibrillation as an indication for her anticoagulation.  Past Medical History:  Diagnosis Date   Arthritis    Atrial fibrillation (HCC)    CHF (congestive heart failure) (HCC)    DM2 (diabetes mellitus, type 2) (HCC) 06/24/2021   HLD (hyperlipidemia)    Hypertension     Family History  Problem Relation Age of Onset   Diabetes Mother    Heart disease Mother    Heart attack Father    Diabetes Brother    Breast cancer Maternal Grandmother    Breast cancer Maternal Aunt    Colon cancer Neg Hx    Colon polyps Neg Hx     SOCIAL HISTORY: Social History   Socioeconomic History   Marital status: Widowed    Spouse name: Not on file   Number of children: Not on file   Years of education: Not on file   Highest education level: Not on file  Occupational History   Not on file  Tobacco Use   Smoking status: Never   Smokeless tobacco: Never  Vaping Use   Vaping Use: Never used  Substance and Sexual Activity   Alcohol use: No   Drug use: No   Sexual activity: Not on file  Other Topics Concern   Not on file  Social History Narrative   Not on file   Social Determinants of Health   Financial Resource Strain: Not on file  Food Insecurity: Not on file  Transportation Needs: Not on file   Physical Activity: Not on file  Stress: Not on file  Social Connections: Not on file  Intimate Partner Violence: Not on file    No Known Allergies  Current Outpatient Medications  Medication Sig Dispense Refill   apixaban (ELIQUIS) 5 MG TABS tablet Take 1 tablet (5 mg total) by mouth 2 (two) times daily. 60 tablet 5   atorvastatin (LIPITOR) 40 MG tablet Take 1 tablet (40 mg total) by mouth daily at 6 PM. 30 tablet 5   ceFEPIme 2 g in sodium chloride 0.9 % 100 mL Inject 2 g into the vein daily.     cyanocobalamin (VITAMIN B12) 1000 MCG tablet Take 1,000 mcg by mouth daily.     ferrous sulfate 325 (65 FE) MG tablet Take 1 tablet (325 mg total) by mouth daily. 30 tablet 3   furosemide (LASIX) 80 MG tablet Take 1 pill on Monday, Wednesday, Friday and Sunday. 30 tablet 0   pantoprazole (PROTONIX) 40 MG tablet Take 1 tablet (40 mg total) by mouth daily. 30 tablet 1   predniSONE (DELTASONE) 20 MG tablet Take 1 tablet (20 mg total) by mouth daily. (Patient taking differently: Take 10 mg by mouth daily.)     sulfamethoxazole-trimethoprim (BACTRIM DS) 800-160 MG tablet Take 1 tablet by mouth 3 (three)   times a week.     VELPHORO 500 MG chewable tablet Chew 500 mg by mouth 3 (three) times daily.     acetaminophen (TYLENOL) 325 MG tablet Take 2 tablets (650 mg total) by mouth every 6 (six) hours as needed for mild pain (or Fever >/= 101). (Patient not taking: Reported on 10/24/2021)     albuterol (PROVENTIL HFA;VENTOLIN HFA) 108 (90 Base) MCG/ACT inhaler Inhale 2 puffs into the lungs daily as needed for wheezing or shortness of breath. (Patient not taking: Reported on 10/24/2021) 1 Inhaler 5   amLODipine (NORVASC) 5 MG tablet Take 1 tablet (5 mg total) by mouth daily. (Patient not taking: Reported on 10/24/2021) 30 tablet 0   benzonatate (TESSALON) 100 MG capsule Take 1 capsule (100 mg total) by mouth 3 (three) times daily as needed for cough. (Patient not taking: Reported on 10/24/2021) 20 capsule 0   blood  glucose meter kit and supplies KIT Dispense based on patient and insurance preference. Use up to four times daily as directed. (Patient not taking: Reported on 10/24/2021) 1 each 0   insulin aspart (NOVOLOG) 100 UNIT/ML injection Inject 0-9 Units into the skin 3 (three) times daily with meals. (Patient not taking: Reported on 10/24/2021) 10 mL 11   ipratropium-albuterol (DUONEB) 0.5-2.5 (3) MG/3ML SOLN Take 3 mLs by nebulization every 6 (six) hours. (Patient not taking: Reported on 10/24/2021) 360 mL    metoprolol tartrate (LOPRESSOR) 25 MG tablet Take 0.5 tablets (12.5 mg total) by mouth 2 (two) times daily. (Patient not taking: Reported on 10/24/2021)     ondansetron (ZOFRAN) 4 MG tablet Take 1 tablet (4 mg total) by mouth every 6 (six) hours as needed for nausea. (Patient not taking: Reported on 10/24/2021) 20 tablet 0   ondansetron (ZOFRAN) 4 MG/2ML SOLN injection Inject 2 mLs (4 mg total) into the vein every 6 (six) hours as needed for nausea. (Patient not taking: Reported on 10/24/2021) 2 mL 0   patiromer (VELTASSA) 8.4 g packet Take 2 packets (16.8 g total) by mouth daily. (Patient not taking: Reported on 10/24/2021) 30 each    sodium bicarbonate 650 MG tablet Take 2 tablets (1,300 mg total) by mouth 2 (two) times daily. (Patient not taking: Reported on 10/24/2021)     No current facility-administered medications for this visit.    REVIEW OF SYSTEMS:  [X] denotes positive finding, [ ] denotes negative finding Cardiac  Comments:  Chest pain or chest pressure:    Shortness of breath upon exertion:    Short of breath when lying flat:    Irregular heart rhythm:        Vascular    Pain in calf, thigh, or hip brought on by ambulation: x   Pain in feet at night that wakes you up from your sleep:     Blood clot in your veins:    Leg swelling:         Pulmonary    Oxygen at home:    Productive cough:     Wheezing:         Neurologic    Sudden weakness in arms or legs:  x   Sudden numbness in arms or  legs:     Sudden onset of difficulty speaking or slurred speech:    Temporary loss of vision in one eye:     Problems with dizziness:         Gastrointestinal    Blood in stool:     Vomited blood:           Genitourinary    Burning when urinating:     Blood in urine:        Psychiatric    Major depression:         Hematologic    Bleeding problems:    Problems with blood clotting too easily:        Skin    Rashes or ulcers:        Constitutional    Fever or chills:      PHYSICAL EXAM: Vitals:   10/24/21 1351  BP: 126/73  Pulse: 74  Temp: (!) 96.6 F (35.9 C)  SpO2: 98%  Weight: 152 lb (68.9 kg)  Height: 5' 6" (1.676 m)    GENERAL: The patient is a well-nourished female, in no acute distress. The vital signs are documented above. CARDIOVASCULAR: 2+ radial pulses bilaterally.  Very small surface veins bilaterally PULMONARY: There is good air exchange  MUSCULOSKELETAL: There are no major deformities or cyanosis. NEUROLOGIC: No focal weakness or paresthesias are detected. SKIN: There are no ulcers or rashes noted. PSYCHIATRIC: The patient has a normal affect.  DATA:  I imaged her cephalic and basilic veins with SonoSite ultrasound in our office.  This shows extremely small veins bilaterally.  MEDICAL ISSUES: Had long discussion with the patient and her daughter.  I do not feel that she is a candidate for fistula attempt with extremely small cephalic and basilic veins bilaterally.  I discussed the risk of long-term use of tunneled hemodialysis catheter with catheter-based infection.  I would recommend a left arm AV graft as her initial arm access.  Explained the procedure in detail.  Splane this will be done as an outpatient at [facility].  I discussed risk to include thrombosis of the graft which would require additional treatment to maintain patency.  We will plan surgery on 11/06/2021 at [facility]   Haddon Fyfe F. Bertram Haddix, MD FACS Vascular and Vein  Specialists of [place] Office Tel [phone] Pager [phone]  Note: Portions of this report may have been transcribed using voice recognition software.  Every effort has been made to ensure accuracy; however, inadvertent computerized transcription errors may still be present.  

## 2021-10-24 NOTE — H&P (View-Only) (Signed)
Vascular and Vein Specialist of [place]  Patient name: Mackenzie [name] MRN: [id number] DOB: [birth year] Sex: female  REASON FOR CONSULT: Evaluation for long-term hemodialysis access  HPI: Mackenzie [name] is a 71 y.o. female, who is here today for discussion of hemodialysis options.  She is here today with her daughter.  He had initiation of dialysis with a tunneled hemodialysis catheter.  She is currently dialyzed at [facility] in [place].  He has had no issues of infection of her catheter today.  She is here today to discuss long-term arm access.  She does not have a pacemaker.  She is on Eliquis.  She is right-handed.  She has history of atrial fibrillation as an indication for her anticoagulation.  Past Medical History:  Diagnosis Date   Arthritis    Atrial fibrillation (HCC)    CHF (congestive heart failure) (HCC)    DM2 (diabetes mellitus, type 2) ([place]) 06/24/2021   HLD (hyperlipidemia)    Hypertension     Family History  Problem Relation Age of Onset   Diabetes Mother    Heart disease Mother    Heart attack Father    Diabetes Brother    Breast cancer Maternal Grandmother    Breast cancer Maternal Aunt    Colon cancer Neg Hx    Colon polyps Neg Hx     SOCIAL HISTORY: Social History   Socioeconomic History   Marital status: Widowed    Spouse name: Not on file   Number of children: Not on file   Years of education: Not on file   Highest education level: Not on file  Occupational History   Not on file  Tobacco Use   Smoking status: Never   Smokeless tobacco: Never  Vaping Use   Vaping Use: Never used  Substance and Sexual Activity   Alcohol use: No   Drug use: No   Sexual activity: Not on file  Other Topics Concern   Not on file  Social History Narrative   Not on file   Social Determinants of Health   Financial Resource Strain: Not on file  Food Insecurity: Not on file  Transportation Needs: Not on file   Physical Activity: Not on file  Stress: Not on file  Social Connections: Not on file  Intimate Partner Violence: Not on file    No Known Allergies  Current Outpatient Medications  Medication Sig Dispense Refill   apixaban (ELIQUIS) 5 MG TABS tablet Take 1 tablet (5 mg total) by mouth 2 (two) times daily. 60 tablet 5   atorvastatin (LIPITOR) 40 MG tablet Take 1 tablet (40 mg total) by mouth daily at 6 PM. 30 tablet 5   ceFEPIme 2 g in sodium chloride 0.9 % 100 mL Inject 2 g into the vein daily.     cyanocobalamin (VITAMIN B12) 1000 MCG tablet Take 1,000 mcg by mouth daily.     ferrous sulfate 325 (65 FE) MG tablet Take 1 tablet (325 mg total) by mouth daily. 30 tablet 3   furosemide (LASIX) 80 MG tablet Take 1 pill on Monday, Wednesday, Friday and Sunday. 30 tablet 0   pantoprazole (PROTONIX) 40 MG tablet Take 1 tablet (40 mg total) by mouth daily. 30 tablet 1   predniSONE (DELTASONE) 20 MG tablet Take 1 tablet (20 mg total) by mouth daily. (Patient taking differently: Take 10 mg by mouth daily.)     sulfamethoxazole-trimethoprim (BACTRIM DS) 800-160 MG tablet Take 1 tablet by mouth 3 (three)  times a week.     VELPHORO 500 MG chewable tablet Chew 500 mg by mouth 3 (three) times daily.     acetaminophen (TYLENOL) 325 MG tablet Take 2 tablets (650 mg total) by mouth every 6 (six) hours as needed for mild pain (or Fever >/= 101). (Patient not taking: Reported on 10/24/2021)     albuterol (PROVENTIL HFA;VENTOLIN HFA) 108 (90 Base) MCG/ACT inhaler Inhale 2 puffs into the lungs daily as needed for wheezing or shortness of breath. (Patient not taking: Reported on 10/24/2021) 1 Inhaler 5   amLODipine (NORVASC) 5 MG tablet Take 1 tablet (5 mg total) by mouth daily. (Patient not taking: Reported on 10/24/2021) 30 tablet 0   benzonatate (TESSALON) 100 MG capsule Take 1 capsule (100 mg total) by mouth 3 (three) times daily as needed for cough. (Patient not taking: Reported on 10/24/2021) 20 capsule 0   blood  glucose meter kit and supplies KIT Dispense based on patient and insurance preference. Use up to four times daily as directed. (Patient not taking: Reported on 10/24/2021) 1 each 0   insulin aspart (NOVOLOG) 100 UNIT/ML injection Inject 0-9 Units into the skin 3 (three) times daily with meals. (Patient not taking: Reported on 10/24/2021) 10 mL 11   ipratropium-albuterol (DUONEB) 0.5-2.5 (3) MG/3ML SOLN Take 3 mLs by nebulization every 6 (six) hours. (Patient not taking: Reported on 10/24/2021) 360 mL    metoprolol tartrate (LOPRESSOR) 25 MG tablet Take 0.5 tablets (12.5 mg total) by mouth 2 (two) times daily. (Patient not taking: Reported on 10/24/2021)     ondansetron (ZOFRAN) 4 MG tablet Take 1 tablet (4 mg total) by mouth every 6 (six) hours as needed for nausea. (Patient not taking: Reported on 10/24/2021) 20 tablet 0   ondansetron (ZOFRAN) 4 MG/2ML SOLN injection Inject 2 mLs (4 mg total) into the vein every 6 (six) hours as needed for nausea. (Patient not taking: Reported on 10/24/2021) 2 mL 0   patiromer (VELTASSA) 8.4 g packet Take 2 packets (16.8 g total) by mouth daily. (Patient not taking: Reported on 10/24/2021) 30 each    sodium bicarbonate 650 MG tablet Take 2 tablets (1,300 mg total) by mouth 2 (two) times daily. (Patient not taking: Reported on 10/24/2021)     No current facility-administered medications for this visit.    REVIEW OF SYSTEMS:  _0  denotes positive finding, _1  denotes negative finding Cardiac  Comments:  Chest pain or chest pressure:    Shortness of breath upon exertion:    Short of breath when lying flat:    Irregular heart rhythm:        Vascular    Pain in calf, thigh, or hip brought on by ambulation: x   Pain in feet at night that wakes you up from your sleep:     Blood clot in your veins:    Leg swelling:         Pulmonary    Oxygen at home:    Productive cough:     Wheezing:         Neurologic    Sudden weakness in arms or legs:  x   Sudden numbness in arms or  legs:     Sudden onset of difficulty speaking or slurred speech:    Temporary loss of vision in one eye:     Problems with dizziness:         Gastrointestinal    Blood in stool:     Vomited blood:  Genitourinary    Burning when urinating:     Blood in urine:        Psychiatric    Major depression:         Hematologic    Bleeding problems:    Problems with blood clotting too easily:        Skin    Rashes or ulcers:        Constitutional    Fever or chills:      PHYSICAL EXAM: Vitals:   10/24/21 1351  BP: 126/73  Pulse: 74  Temp: (!) 96.6 F (35.9 C)  SpO2: 98%  Weight: 152 lb (68.9 kg)  Height: _0  (1.676 m)    GENERAL: The patient is a well-nourished female, in no acute distress. The vital signs are documented above. CARDIOVASCULAR: 2+ radial pulses bilaterally.  Very small surface veins bilaterally PULMONARY: There is good air exchange  MUSCULOSKELETAL: There are no major deformities or cyanosis. NEUROLOGIC: No focal weakness or paresthesias are detected. SKIN: There are no ulcers or rashes noted. PSYCHIATRIC: The patient has a normal affect.  DATA:  I imaged her cephalic and basilic veins with SonoSite ultrasound in our office.  This shows extremely small veins bilaterally.  MEDICAL ISSUES: Had long discussion with the patient and her daughter.  I do not feel that she is a candidate for fistula attempt with extremely small cephalic and basilic veins bilaterally.  I discussed the risk of long-term use of tunneled hemodialysis catheter with catheter-based infection.  I would recommend a left arm AV graft as her initial arm access.  Explained the procedure in detail.  Splane this will be done as an outpatient at [facility].  I discussed risk to include thrombosis of the graft which would require additional treatment to maintain patency.  We will plan surgery on 11/06/2021 at [facility]   Rosetta Posner, MD [facility] Vascular and Vein  Specialists of [facility] Tel [phone] Pager [phone]  Note: Portions of this report may have been transcribed using voice recognition software.  Every effort has been made to ensure accuracy; however, inadvertent computerized transcription errors may still be present.

## 2021-10-25 ENCOUNTER — Other Ambulatory Visit: Payer: Self-pay

## 2021-10-25 DIAGNOSIS — N186 End stage renal disease: Secondary | ICD-10-CM

## 2021-11-05 ENCOUNTER — Encounter (HOSPITAL_COMMUNITY)
Admission: RE | Admit: 2021-11-05 | Discharge: 2021-11-05 | Disposition: A | Discharge status: Home / Self Care | Payer: Medicare Other | Source: Ambulatory Visit | Attending: Vascular Surgery | Admitting: Vascular Surgery

## 2021-11-05 ENCOUNTER — Other Ambulatory Visit: Payer: Self-pay

## 2021-11-05 ENCOUNTER — Encounter (HOSPITAL_COMMUNITY): Payer: Self-pay

## 2021-11-06 ENCOUNTER — Other Ambulatory Visit: Payer: Self-pay

## 2021-11-06 ENCOUNTER — Ambulatory Visit (HOSPITAL_COMMUNITY)
Admission: RE | Admit: 2021-11-06 | Discharge: 2021-11-06 | Disposition: A | Discharge status: Home / Self Care | Payer: Medicare Other | Source: Ambulatory Visit | Attending: Vascular Surgery | Admitting: Vascular Surgery

## 2021-11-06 ENCOUNTER — Encounter (HOSPITAL_COMMUNITY)
Admission: RE | Disposition: A | Discharge status: Home / Self Care | Payer: Self-pay | Source: Ambulatory Visit | Attending: Vascular Surgery

## 2021-11-06 ENCOUNTER — Ambulatory Visit (HOSPITAL_BASED_OUTPATIENT_CLINIC_OR_DEPARTMENT_OTHER): Payer: Medicare Other | Admitting: Anesthesiology

## 2021-11-06 ENCOUNTER — Encounter (HOSPITAL_COMMUNITY): Payer: Self-pay | Admitting: Vascular Surgery

## 2021-11-06 ENCOUNTER — Ambulatory Visit (HOSPITAL_COMMUNITY): Payer: Medicare Other | Admitting: Anesthesiology

## 2021-11-06 DIAGNOSIS — N186 End stage renal disease: Secondary | ICD-10-CM | POA: Insufficient documentation

## 2021-11-06 DIAGNOSIS — I5032 Chronic diastolic (congestive) heart failure: Secondary | ICD-10-CM | POA: Diagnosis not present

## 2021-11-06 DIAGNOSIS — I132 Hypertensive heart and chronic kidney disease with heart failure and with stage 5 chronic kidney disease, or end stage renal disease: Secondary | ICD-10-CM

## 2021-11-06 DIAGNOSIS — N185 Chronic kidney disease, stage 5: Secondary | ICD-10-CM | POA: Diagnosis not present

## 2021-11-06 DIAGNOSIS — Z794 Long term (current) use of insulin: Secondary | ICD-10-CM | POA: Insufficient documentation

## 2021-11-06 DIAGNOSIS — E1122 Type 2 diabetes mellitus with diabetic chronic kidney disease: Secondary | ICD-10-CM | POA: Diagnosis not present

## 2021-11-06 DIAGNOSIS — Z992 Dependence on renal dialysis: Secondary | ICD-10-CM

## 2021-11-06 DIAGNOSIS — Z79899 Other long term (current) drug therapy: Secondary | ICD-10-CM | POA: Diagnosis not present

## 2021-11-06 DIAGNOSIS — I509 Heart failure, unspecified: Secondary | ICD-10-CM | POA: Diagnosis not present

## 2021-11-06 DIAGNOSIS — Z7901 Long term (current) use of anticoagulants: Secondary | ICD-10-CM | POA: Insufficient documentation

## 2021-11-06 HISTORY — PX: AV FISTULA PLACEMENT: SHX1204

## 2021-11-06 LAB — BASIC METABOLIC PANEL
Anion gap: 14 (ref 5–15)
BUN: 36 mg/dL — ABNORMAL HIGH (ref 8–23)
CO2: 26 mmol/L (ref 22–32)
Calcium: 8.8 mg/dL — ABNORMAL LOW (ref 8.9–10.3)
Chloride: 99 mmol/L (ref 98–111)
Creatinine, Ser: 4.59 mg/dL — ABNORMAL HIGH (ref 0.44–1.00)
GFR, Estimated: 10 mL/min — ABNORMAL LOW (ref >60)
Glucose, Bld: 85 mg/dL (ref 70–99)
Potassium: 5.1 mmol/L (ref 3.5–5.1)
Sodium: 139 mmol/L (ref 135–145)

## 2021-11-06 LAB — POCT I-STAT, CHEM 8
BUN: 50 mg/dL — ABNORMAL HIGH (ref 8–23)
Calcium, Ion: 0.65 mmol/L — CL (ref 1.15–1.40)
Chloride: 106 mmol/L (ref 98–111)
Creatinine, Ser: 5.1 mg/dL — ABNORMAL HIGH (ref 0.44–1.00)
Glucose, Bld: 81 mg/dL (ref 70–99)
HCT: 43 % (ref 36.0–46.0)
Hemoglobin: 14.6 g/dL (ref 12.0–15.0)
Potassium: 6.9 mmol/L (ref 3.5–5.1)
Sodium: 130 mmol/L — ABNORMAL LOW (ref 135–145)
TCO2: 24 mmol/L (ref 22–32)

## 2021-11-06 SURGERY — INSERTION OF ARTERIOVENOUS (AV) GORE-TEX GRAFT ARM
Anesthesia: General | Site: Arm Lower | Laterality: Left

## 2021-11-06 MED ORDER — CHLORHEXIDINE GLUCONATE 4 % EX LIQD: 60.0000 mL | Freq: Once | CUTANEOUS | Status: DC

## 2021-11-06 MED ORDER — CHLORHEXIDINE GLUCONATE 0.12 % MT SOLN: 15.0000 mL | Freq: Once | OROMUCOSAL | Status: DC

## 2021-11-06 MED ORDER — ORAL CARE MOUTH RINSE: 15.0000 mL | Freq: Once | OROMUCOSAL | Status: DC

## 2021-11-06 MED ORDER — HEPARIN 6000 UNIT IRRIGATION SOLUTION
Status: DC | PRN
  Administered 2021-11-06: 1

## 2021-11-06 MED ORDER — SODIUM CHLORIDE 0.9 % IV SOLN: INTRAVENOUS | Status: DC

## 2021-11-06 MED ORDER — 0.9 % SODIUM CHLORIDE (POUR BTL) OPTIME
TOPICAL | Status: DC | PRN
  Administered 2021-11-06: 1000 mL

## 2021-11-06 MED ORDER — LACTATED RINGERS IV SOLN: INTRAVENOUS | Status: DC

## 2021-11-06 MED ORDER — OXYCODONE-ACETAMINOPHEN 5-325 MG PO TABS: 1.0000 | ORAL_TABLET | Freq: Four times a day (QID) | ORAL | 0 refills | Status: DC | PRN

## 2021-11-06 MED ORDER — LIDOCAINE-EPINEPHRINE 0.5 %-1:200000 IJ SOLN
INTRAMUSCULAR | Status: AC
  Filled 2021-11-06: qty 1

## 2021-11-06 MED ORDER — FENTANYL CITRATE (PF) 100 MCG/2ML IJ SOLN
INTRAMUSCULAR | Status: AC
  Filled 2021-11-06: qty 2

## 2021-11-06 MED ORDER — LIDOCAINE-EPINEPHRINE 0.5 %-1:200000 IJ SOLN
INTRAMUSCULAR | Status: DC | PRN
  Administered 2021-11-06: 10 mL

## 2021-11-06 MED ORDER — PROPOFOL 500 MG/50ML IV EMUL
INTRAVENOUS | Status: DC | PRN
  Administered 2021-11-06: 100 ug/kg/min via INTRAVENOUS

## 2021-11-06 MED ORDER — ONDANSETRON HCL 4 MG/2ML IJ SOLN
INTRAMUSCULAR | Status: DC | PRN
  Administered 2021-11-06: 4 mg via INTRAVENOUS

## 2021-11-06 MED ORDER — CEFAZOLIN SODIUM-DEXTROSE 2-4 GM/100ML-% IV SOLN
INTRAVENOUS | Status: AC
  Filled 2021-11-06: qty 100

## 2021-11-06 MED ORDER — PROPOFOL 500 MG/50ML IV EMUL
INTRAVENOUS | Status: AC
  Filled 2021-11-06: qty 50

## 2021-11-06 MED ORDER — HEPARIN SODIUM (PORCINE) 1000 UNIT/ML IJ SOLN
INTRAMUSCULAR | Status: AC
  Filled 2021-11-06: qty 6

## 2021-11-06 MED ORDER — CEFAZOLIN SODIUM-DEXTROSE 2-4 GM/100ML-% IV SOLN
2.0000 g | INTRAVENOUS | Status: AC
  Administered 2021-11-06: 2 g via INTRAVENOUS

## 2021-11-06 MED ORDER — FENTANYL CITRATE (PF) 100 MCG/2ML IJ SOLN
INTRAMUSCULAR | Status: DC | PRN
  Administered 2021-11-06: 50 ug via INTRAVENOUS

## 2021-11-06 MED ORDER — PHENYLEPHRINE HCL-NACL 20-0.9 MG/250ML-% IV SOLN
INTRAVENOUS | Status: DC | PRN
  Administered 2021-11-06: 50 ug/min via INTRAVENOUS

## 2021-11-06 SURGICAL SUPPLY — 37 items
ADH SKN CLS LQ APL DERMABOND (GAUZE/BANDAGES/DRESSINGS) ×1
ARMBAND PINK RESTRICT EXTREMIT (MISCELLANEOUS) ×1 IMPLANT
BAG HAMPER (MISCELLANEOUS) ×1 IMPLANT
CANNULA VESSEL 3MM 2 BLNT TIP (CANNULA) ×1 IMPLANT
CLIP LIGATING EXTRA MED SLVR (CLIP) ×1 IMPLANT
CLIP LIGATING EXTRA SM BLUE (MISCELLANEOUS) ×1 IMPLANT
COVER LIGHT HANDLE STERIS (MISCELLANEOUS) ×2 IMPLANT
COVER MAYO STAND XLG (MISCELLANEOUS) ×1 IMPLANT
DECANTER SPIKE VIAL GLASS SM (MISCELLANEOUS) ×1 IMPLANT
DERMABOND ADVANCED .7 DNX6 (GAUZE/BANDAGES/DRESSINGS) IMPLANT
ELECT REM PT RETURN 9FT ADLT (ELECTROSURGICAL) ×1
ELECTRODE REM PT RTRN 9FT ADLT (ELECTROSURGICAL) ×1 IMPLANT
GAUZE SPONGE 4X4 12PLY STRL (GAUZE/BANDAGES/DRESSINGS) ×2 IMPLANT
GLOVE BIOGEL PI IND STRL 7.0 (GLOVE) ×2 IMPLANT
GLOVE SURG MICRO LTX SZ7.5 (GLOVE) ×1 IMPLANT
GOWN STRL REUS W/TWL LRG LVL3 (GOWN DISPOSABLE) ×3 IMPLANT
GRAFT GORETEX STRT 4-7X45 (Vascular Products) IMPLANT
IV NS 500ML (IV SOLUTION) ×1
IV NS 500ML BAXH (IV SOLUTION) ×2 IMPLANT
KIT BLADEGUARD II DBL (SET/KITS/TRAYS/PACK) ×1 IMPLANT
KIT TURNOVER KIT A (KITS) ×1 IMPLANT
MANIFOLD NEPTUNE II (INSTRUMENTS) ×1 IMPLANT
MARKER SKIN DUAL TIP RULER LAB (MISCELLANEOUS) ×2 IMPLANT
NDL HYPO 18GX1.5 BLUNT FILL (NEEDLE) ×1 IMPLANT
NEEDLE HYPO 18GX1.5 BLUNT FILL (NEEDLE) ×1 IMPLANT
PACK CV ACCESS (CUSTOM PROCEDURE TRAY) ×1 IMPLANT
PAD ARMBOARD 7.5X6 YLW CONV (MISCELLANEOUS) ×1 IMPLANT
SET BASIN LINEN APH (SET/KITS/TRAYS/PACK) ×1 IMPLANT
SOL PREP POV-IOD 4OZ 10% (MISCELLANEOUS) ×1 IMPLANT
SOL PREP PROV IODINE SCRUB 4OZ (MISCELLANEOUS) ×1 IMPLANT
STOCKINETTE 4X48 STRL (DRAPES) IMPLANT
SUT PROLENE 6 0 CC (SUTURE) ×1 IMPLANT
SUT VIC AB 3-0 SH 27 (SUTURE) ×1
SUT VIC AB 3-0 SH 27X BRD (SUTURE) ×1 IMPLANT
SYR 10ML LL (SYRINGE) ×1 IMPLANT
SYR CONTROL 10ML LL (SYRINGE) ×1 IMPLANT
UNDERPAD 30X36 HEAVY ABSORB (UNDERPADS AND DIAPERS) ×1 IMPLANT

## 2021-11-06 NOTE — Anesthesia Preprocedure Evaluation (Signed)
Anesthesia Evaluation  Patient identified by MRN, date of birth, ID band Patient awake    Reviewed: Allergy & Precautions, H&P , NPO status , Patient's Chart, lab work & pertinent test results, reviewed documented beta blocker date and time   Airway Mallampati: II  TM Distance: >3 FB Neck ROM: full    Dental no notable dental hx.    Pulmonary neg pulmonary ROS,    Pulmonary exam normal breath sounds clear to auscultation       Cardiovascular Exercise Tolerance: Good hypertension, +CHF   Rhythm:regular Rate:Normal     Neuro/Psych negative neurological ROS  negative psych ROS   GI/Hepatic negative GI ROS, Neg liver ROS,   Endo/Other  negative endocrine ROSdiabetes, Type 2  Renal/GU CRF, ESRF and DialysisRenal disease  negative genitourinary   Musculoskeletal   Abdominal   Peds  Hematology  (+) Blood dyscrasia, anemia ,   Anesthesia Other Findings   Reproductive/Obstetrics negative OB ROS                             Anesthesia Physical Anesthesia Plan  ASA: 3  Anesthesia Plan: General   Post-op Pain Management:    Induction:   PONV Risk Score and Plan: Propofol infusion  Airway Management Planned:   Additional Equipment:   Intra-op Plan:   Post-operative Plan:   Informed Consent: I have reviewed the patients History and Physical, chart, labs and discussed the procedure including the risks, benefits and alternatives for the proposed anesthesia with the patient or authorized representative who has indicated his/her understanding and acceptance.     Dental Advisory Given  Plan Discussed with: CRNA  Anesthesia Plan Comments:         Anesthesia Quick Evaluation

## 2021-11-06 NOTE — Op Note (Signed)
    OPERATIVE REPORT  DATE OF SURGERY: 11/06/2021  PATIENT: Mackenzie [name], 71 y.o. female MRN: [id number]  DOB: 08-12-1950  PRE-OPERATIVE DIAGNOSIS: End-stage renal disease  POST-OPERATIVE DIAGNOSIS:  Same  PROCEDURE: Left upper arm AV Gore-Tex graft placement  SURGEON:  Curt Jews, M.D.  PHYSICIAN ASSISTANT: Vevelyn Royals, RNFA  The assistant was needed for exposure and to expedite the case  ANESTHESIA: MAC  EBL: per anesthesia record  Total I/O In: 400 [I.V.:300; IV Piggyback:100] Out: 10 [Blood:10]  BLOOD ADMINISTERED: none  DRAINS: none  SPECIMEN: none  COUNTS CORRECT:  YES  PATIENT DISPOSITION:  PACU - hemodynamically stable  PROCEDURE DETAILS: Patient was taken operating placed supine position with area of the left arm left axilla were prepped draped usual sterile fashion.  Using local anesthesia, incision was made over the brachial artery pulse at the antecubital space.  The artery was of moderate size with minimal atherosclerotic change.  Next a separate incision was made at the axilla over the pulse and the patient's axillary vein was identified.  The patient had a large axillary vein.  A subcutaneous tunnel was created from the antecubital incision to the axillary incision and a 4 x 7 tapered stretch Gore-Tex graft was brought through the tunnel.  The brachial artery was occluded proximally and distally and was opened with an 11 blade and extended longitudinally with Potts scissors.  A small arteriotomy was created to reduce risk of steal.  The 4 mm portion of the graft was spatulated at approximately 5 mm and was sewn end-to-side to the artery with a running 6-0 Prolene suture.  This anastomosis was tested and found to be adequate.  The graft was flushed with heparinized saline and reoccluded.  The axillary vein was then occluded proximally and distally and was opened with an 11 blade and sent illustrating the Pott scissors.  The 7 mm portion of the graft was spatulated  and sewn end-to-side to the vein with a running 6-0 Prolene suture.  Clamps were removed and excellent thrill was noted.  The wounds were irrigated with saline.  Hemostasis was obtained with electrocautery.  Wounds were closed with 3-0 Vicryl in the subcutaneous and subcuticular tissue.  Sterile dressing was applied and the patient was transferred to the recovery room in stable condition   Mackenzie [name], M.D., [facility] 11/06/2021 11:51 AM  Note: Portions of this report may have been transcribed using voice recognition software.  Every effort has been made to ensure accuracy; however, inadvertent computerized transcription errors may still be present.

## 2021-11-06 NOTE — Interval H&P Note (Signed)
History and Physical Interval Note:  11/06/2021 7:13 AM  Mackenzie [name]  has presented today for surgery, with the diagnosis of ESRD.  The various methods of treatment have been discussed with the patient and family. After consideration of risks, benefits and other options for treatment, the patient has consented to  Procedure(s): INSERTION OF LEFT ARM ARTERIOVENOUS (AV) GORE-TEX GRAFT (Left) as a surgical intervention.  The patient's history has been reviewed, patient examined, no change in status, stable for surgery.  I have reviewed the patient's chart and labs.  Questions were answered to the patient's satisfaction.     Curt Jews

## 2021-11-06 NOTE — Transfer of Care (Signed)
Immediate Anesthesia Transfer of Care Note  Patient: Mackenzie [name]  Procedure(s) Performed: INSERTION OF LEFT ARM ARTERIOVENOUS (AV) GORE-TEX GRAFT (Left: Arm Lower)  Patient Location: PACU and Short Stay  Anesthesia Type:General  Level of Consciousness: awake, alert  and patient cooperative  Airway & Oxygen Therapy: Patient Spontanous Breathing and Patient connected to nasal cannula oxygen  Post-op Assessment: Report given to RN, Post -op Vital signs reviewed and stable and Patient moving all extremities X 4  Post vital signs: Reviewed and stable  Last Vitals:  Vitals Value Taken Time  BP 123/60 11/06/21 1152  Temp 36.8 C 11/06/21 1152  Pulse 70 11/06/21 1152  Resp 16 11/06/21 1152  SpO2 98 % 11/06/21 1152    Last Pain:  Vitals:   11/06/21 1152  TempSrc: Oral  PainSc: 0-No pain         Complications: No notable events documented.

## 2021-11-06 NOTE — Discharge Instructions (Signed)
[facility]  Discharge Instructions  AV Fistula or Graft Surgery for Dialysis Access  Please refer to the following instructions for your post-procedure care. Your surgeon or physician assistant will discuss any changes with you.  Activity  You may drive the day following your surgery, if you are comfortable and no longer taking prescription pain medication. Resume full activity as the soreness in your incision resolves.  Bathing/Showering  You may shower after you go home. Keep your incision dry for 48 hours. Do not soak in a bathtub, hot tub, or swim until the incision heals completely. You may not shower if you have a hemodialysis catheter.  Incision Care  Clean your incision with mild soap and water after 48 hours. Pat the area dry with a clean towel. You do not need a bandage unless otherwise instructed. Do not apply any ointments or creams to your incision. You may have skin glue on your incision. Do not peel it off. It will come off on its own in about one week. Your arm may swell a bit after surgery. To reduce swelling use pillows to elevate your arm so it is above your heart. Your doctor will tell you if you need to lightly wrap your arm with an ACE bandage.  Diet  Resume your normal diet. There are not special food restrictions following this procedure. In order to heal from your surgery, it is CRITICAL to get adequate nutrition. Your body requires vitamins, minerals, and protein. Vegetables are the best source of vitamins and minerals. Vegetables also provide the perfect balance of protein. Processed food has little nutritional value, so try to avoid this.  Medications  Resume taking all of your medications. If your incision is causing pain, you may take over-the counter pain relievers such as acetaminophen (Tylenol). If you were prescribed a stronger pain medication, please be aware these medications can cause nausea and constipation. Prevent  nausea by taking the medication with a snack or meal. Avoid constipation by drinking plenty of fluids and eating foods with high amount of fiber, such as fruits, vegetables, and grains.  Do not take Tylenol if you are taking prescription pain medications.  Follow up Your surgeon may want to see you in the office following your access surgery. If so, this will be arranged at the time of your surgery.  Please call us immediately for any of the following conditions:  Increased pain, redness, drainage (pus) from your incision site Fever of 101 degrees or higher Severe or worsening pain at your incision site Hand pain or numbness.  Reduce your risk of vascular disease:  Stop smoking. If you would like help, call [facility] at 1-800-QUIT-NOW 385-257-5622) or [place] at [place] your cholesterol Maintain a desired weight Control your diabetes Keep your blood pressure down  Dialysis  It will take several weeks to several months for your new dialysis access to be ready for use. Your surgeon will determine when it is okay to use it. Your nephrologist will continue to direct your dialysis. You can continue to use your Permcath until your new access is ready for use.   11/06/2021 Mackenzie [name] Mackenzie [name] Mackenzie [name], Mackenzie [name]  Surgeon(s): Zerek Litsey, Arvilla Meres, MD  Procedure(s): INSERTION OF LEFT ARM ARTERIOVENOUS (AV) GORE-TEX GRAFT   May stick graft immediately   May stick graft on designated area only:    Do not stick graft for 4 weeks    If you have any questions, please call the office  at [phone].

## 2021-11-06 NOTE — Anesthesia Postprocedure Evaluation (Signed)
Anesthesia Post Note  Patient: Mackenzie [name]  Procedure(s) Performed: INSERTION OF LEFT ARM ARTERIOVENOUS (AV) GORE-TEX GRAFT (Left: Arm Lower)  Patient location during evaluation: Phase II Anesthesia Type: General Level of consciousness: awake Pain management: pain level controlled Vital Signs Assessment: post-procedure vital signs reviewed and stable Respiratory status: spontaneous breathing and respiratory function stable Cardiovascular status: blood pressure returned to baseline and stable Postop Assessment: no headache and no apparent nausea or vomiting Anesthetic complications: no Comments: Late entry   No notable events documented.   Last Vitals:  Vitals:   11/06/21 0715 11/06/21 1152  BP: (!) 149/69 123/60  Pulse: 75 70  Resp: (!) 26 16  Temp: 36.7 C 36.8 C  SpO2: 99% 98%    Last Pain:  Vitals:   11/06/21 1152  TempSrc: Oral  PainSc: 0-No pain                 Louann Sjogren

## 2021-11-13 ENCOUNTER — Encounter (HOSPITAL_COMMUNITY): Payer: Self-pay | Admitting: Vascular Surgery

## 2021-12-05 ENCOUNTER — Encounter: Payer: Self-pay | Admitting: Vascular Surgery

## 2021-12-05 ENCOUNTER — Ambulatory Visit (INDEPENDENT_AMBULATORY_CARE_PROVIDER_SITE_OTHER): Payer: Medicare Other | Admitting: Vascular Surgery

## 2021-12-05 VITALS — BP 107/66 | HR 72 | Temp 97.2°F | Ht 66.0 in

## 2021-12-05 DIAGNOSIS — N186 End stage renal disease: Secondary | ICD-10-CM

## 2021-12-05 DIAGNOSIS — Z992 Dependence on renal dialysis: Secondary | ICD-10-CM

## 2021-12-05 NOTE — Progress Notes (Signed)
[facility] [place]  Patient name: Mackenzie [name] MRN: [id number] DOB: [birth year] Sex: female  REASON FOR VISIT: Follow-up his graft on 11/06/2021  HPI: Mackenzie [name] is a 71 y.o. female here today for follow-up.  She had new left upper arm AV Gore-Tex graft placement on 11/06/2021.  She has a right IJ tunneled catheter is working without difficulty.  She has dialysis at [facility] [place].  Current Outpatient Medications  Medication Sig Dispense Refill   acetaminophen (TYLENOL) 325 MG tablet Take 2 tablets (650 mg total) by mouth every 6 (six) hours as needed for mild pain (or Fever >/= 101).     albuterol (PROVENTIL HFA;VENTOLIN HFA) 108 (90 Base) MCG/ACT inhaler Inhale 2 puffs into the lungs daily as needed for wheezing or shortness of breath. 1 Inhaler 5   apixaban (ELIQUIS) 5 MG TABS tablet Take 1 tablet (5 mg total) by mouth 2 (two) times daily. 60 tablet 5   atorvastatin (LIPITOR) 40 MG tablet Take 1 tablet (40 mg total) by mouth daily at 6 PM. (Patient taking differently: Take 40 mg by mouth at bedtime.) 30 tablet 5   cyanocobalamin (VITAMIN B12) 1000 MCG tablet Take 1,000 mcg by mouth daily.     furosemide (LASIX) 80 MG tablet Take 1 pill on Monday, Wednesday, Friday and Sunday. 30 tablet 0   insulin aspart (NOVOLOG) 100 UNIT/ML injection Inject 0-9 Units into the skin 3 (three) times daily with meals. 10 mL 11   oxyCODONE-acetaminophen (PERCOCET) 5-325 MG tablet Take 1 tablet by mouth every 6 (six) hours as needed for severe pain. 8 tablet 0   pantoprazole (PROTONIX) 40 MG tablet Take 1 tablet (40 mg total) by mouth daily. 30 tablet 1   predniSONE (DELTASONE) 20 MG tablet Take 1 tablet (20 mg total) by mouth daily. (Patient taking differently: Take 10 mg by mouth daily.)     sulfamethoxazole-trimethoprim (BACTRIM DS) 800-160 MG tablet Take 1 tablet by mouth every Monday, Wednesday, and Friday.     VELPHORO 500 MG  chewable tablet Chew 500 mg by mouth 3 (three) times daily with meals.     amLODipine (NORVASC) 5 MG tablet Take 1 tablet (5 mg total) by mouth daily. (Patient not taking: Reported on 11/01/2021) 30 tablet 0   benzonatate (TESSALON) 100 MG capsule Take 1 capsule (100 mg total) by mouth 3 (three) times daily as needed for cough. (Patient not taking: Reported on 11/01/2021) 20 capsule 0   blood glucose meter kit and supplies KIT Dispense based on patient and insurance preference. Use up to four times daily as directed. (Patient not taking: Reported on 10/24/2021) 1 each 0   ceFEPIme 2 g in sodium chloride 0.9 % 100 mL Inject 2 g into the vein daily. (Patient not taking: Reported on 11/01/2021)     ferrous sulfate 325 (65 FE) MG tablet Take 1 tablet (325 mg total) by mouth daily. (Patient not taking: Reported on 11/01/2021) 30 tablet 3   ipratropium-albuterol (DUONEB) 0.5-2.5 (3) MG/3ML SOLN Take 3 mLs by nebulization every 6 (six) hours. (Patient not taking: Reported on 11/01/2021) 360 mL    metoprolol tartrate (LOPRESSOR) 25 MG tablet Take 0.5 tablets (12.5 mg total) by mouth 2 (two) times daily. (Patient not taking: Reported on 11/01/2021)     ondansetron (ZOFRAN) 4 MG tablet Take 1 tablet (4 mg total) by mouth every 6 (six) hours as needed for nausea. (Patient not taking: Reported on 11/01/2021) 20 tablet 0  ondansetron (ZOFRAN) 4 MG/2ML SOLN injection Inject 2 mLs (4 mg total) into the vein every 6 (six) hours as needed for nausea. (Patient not taking: Reported on 11/01/2021) 2 mL 0   patiromer (VELTASSA) 8.4 g packet Take 2 packets (16.8 g total) by mouth daily. (Patient not taking: Reported on 11/01/2021) 30 each    sodium bicarbonate 650 MG tablet Take 2 tablets (1,300 mg total) by mouth 2 (two) times daily. (Patient not taking: Reported on 11/01/2021)     No current facility-administered medications for this visit.     PHYSICAL EXAM: Vitals:   12/05/21 1001  BP: 107/66  Pulse: 72  Temp: (!) 97.2 F  (36.2 C)  SpO2: 96%  Height: 5' 6" (1.676 m)    GENERAL: The patient is a well-nourished female, in no acute distress. The vital signs are documented above. Excellent healing of left antecubital and axillary incision with good thrill in the graft.  No bruising.  MEDICAL ISSUES: Recommend 1 additional week and then would begin using her AV graft.  I am available for removal of her catheter at [facility] if desired.  Please call my office to schedule.  See her as needed   Rosetta Posner, MD FACS [facility] [phone]  Note: Portions of this report may have been transcribed using voice recognition software.  Every effort has been made to ensure accuracy; however, inadvertent computerized transcription errors may still be present.

## 2022-03-22 ENCOUNTER — Other Ambulatory Visit: Payer: Self-pay | Admitting: Physician Assistant

## 2022-03-22 DIAGNOSIS — R2689 Other abnormalities of gait and mobility: Secondary | ICD-10-CM

## 2022-03-22 DIAGNOSIS — M21371 Foot drop, right foot: Secondary | ICD-10-CM

## 2022-05-05 ENCOUNTER — Emergency Department (HOSPITAL_COMMUNITY)
Admission: EM | Admit: 2022-05-05 | Discharge: 2022-05-05 | Disposition: A | Discharge status: Home / Self Care | Payer: 59 | Attending: Emergency Medicine | Admitting: Emergency Medicine

## 2022-05-05 ENCOUNTER — Encounter: Payer: Self-pay | Admitting: Internal Medicine

## 2022-05-05 ENCOUNTER — Emergency Department (HOSPITAL_COMMUNITY): Payer: 59

## 2022-05-05 ENCOUNTER — Other Ambulatory Visit: Payer: Self-pay

## 2022-05-05 ENCOUNTER — Encounter (HOSPITAL_COMMUNITY): Payer: Self-pay

## 2022-05-05 DIAGNOSIS — Z794 Long term (current) use of insulin: Secondary | ICD-10-CM | POA: Diagnosis not present

## 2022-05-05 DIAGNOSIS — Z992 Dependence on renal dialysis: Secondary | ICD-10-CM | POA: Diagnosis not present

## 2022-05-05 DIAGNOSIS — J189 Pneumonia, unspecified organism: Secondary | ICD-10-CM

## 2022-05-05 DIAGNOSIS — Z1152 Encounter for screening for COVID-19: Secondary | ICD-10-CM | POA: Diagnosis not present

## 2022-05-05 DIAGNOSIS — Z7901 Long term (current) use of anticoagulants: Secondary | ICD-10-CM | POA: Diagnosis not present

## 2022-05-05 DIAGNOSIS — R0602 Shortness of breath: Secondary | ICD-10-CM | POA: Diagnosis present

## 2022-05-05 DIAGNOSIS — J181 Lobar pneumonia, unspecified organism: Secondary | ICD-10-CM | POA: Insufficient documentation

## 2022-05-05 LAB — RESP PANEL BY RT-PCR (RSV, FLU A&B, COVID)  RVPGX2
Influenza A by PCR: NEGATIVE
Influenza B by PCR: NEGATIVE
Resp Syncytial Virus by PCR: NEGATIVE
SARS Coronavirus 2 by RT PCR: NEGATIVE

## 2022-05-05 MED ORDER — AMOXICILLIN-POT CLAVULANATE 875-125 MG PO TABS
1.0000 | ORAL_TABLET | Freq: Once | ORAL | Status: AC
  Administered 2022-05-05: 1 via ORAL
  Filled 2022-05-05: qty 1

## 2022-05-05 MED ORDER — AMOXICILLIN-POT CLAVULANATE 875-125 MG PO TABS: 1.0000 | ORAL_TABLET | Freq: Two times a day (BID) | ORAL | 0 refills | Status: DC

## 2022-05-05 MED ORDER — ALBUTEROL SULFATE HFA 108 (90 BASE) MCG/ACT IN AERS
2.0000 | INHALATION_SPRAY | RESPIRATORY_TRACT | Status: DC | PRN
  Administered 2022-05-05: 2 via RESPIRATORY_TRACT
  Filled 2022-05-05: qty 6.7

## 2022-05-05 NOTE — ED Provider Notes (Signed)
Mackenzie [name] Provider Note   CSN: [id number] Arrival date & time: 05/05/22  2103     History  Chief Complaint  Patient presents with   Shortness of Breath    Mackenzie [name] is a 72 y.o. female.   Shortness of Breath  This patient is a 72 year old female, she is on dialysis Tuesday Thursdays and Saturdays and has not missed any dialysis sessions.  She reports somewhere between 3 and 5 days of coughing.  She reports that she is coughing up clear phlegm, she does not have a fever or chills and she is not nauseated or vomiting in fact when I ask her about what she describes as vomiting she states she is coughing up phlegm but not vomiting or nauseated.  No diarrhea.  She has not had any medications for this prior to arrival    Home Medications Prior to Admission medications   Medication Sig Start Date End Date Taking? Authorizing Provider  amoxicillin-clavulanate (AUGMENTIN) 875-125 MG tablet Take 1 tablet by mouth every 12 (twelve) hours. 05/05/22  Yes Noemi Chapel, MD  acetaminophen (TYLENOL) 325 MG tablet Take 2 tablets (650 mg total) by mouth every 6 (six) hours as needed for mild pain (or Fever >/= 101). 06/24/21   Lorella Nimrod, MD  albuterol (PROVENTIL HFA;VENTOLIN HFA) 108 (90 Base) MCG/ACT inhaler Inhale 2 puffs into the lungs daily as needed for wheezing or shortness of breath. 06/11/16   Alisa Graff, FNP  apixaban (ELIQUIS) 5 MG TABS tablet Take 1 tablet (5 mg total) by mouth 2 (two) times daily. 06/11/16   Alisa Graff, FNP  atorvastatin (LIPITOR) 40 MG tablet Take 1 tablet (40 mg total) by mouth daily at 6 PM. Patient taking differently: Take 40 mg by mouth at bedtime. 06/11/16   Alisa Graff, FNP  blood glucose meter kit and supplies KIT Dispense based on patient and insurance preference. Use up to four times daily as directed. Patient not taking: Reported on 10/24/2021 01/20/21   Heath Lark D, DO  cyanocobalamin (VITAMIN  B12) 1000 MCG tablet Take 1,000 mcg by mouth daily. 09/27/21   [provider]  ferrous sulfate 325 (65 FE) MG tablet Take 1 tablet (325 mg total) by mouth daily. Patient not taking: Reported on 11/01/2021 01/20/21 01/20/22  Heath Lark D, DO  furosemide (LASIX) 80 MG tablet Take 1 pill on Monday, Wednesday, Friday and Sunday. 07/02/21   Thurnell Lose, MD  insulin aspart (NOVOLOG) 100 UNIT/ML injection Inject 0-9 Units into the skin 3 (three) times daily with meals. 06/24/21   Lorella Nimrod, MD  ipratropium-albuterol (DUONEB) 0.5-2.5 (3) MG/3ML SOLN Take 3 mLs by nebulization every 6 (six) hours. Patient not taking: Reported on 11/01/2021 06/24/21   Lorella Nimrod, MD  metoprolol tartrate (LOPRESSOR) 25 MG tablet Take 0.5 tablets (12.5 mg total) by mouth 2 (two) times daily. Patient not taking: Reported on 11/01/2021 06/24/21   Lorella Nimrod, MD  pantoprazole (PROTONIX) 40 MG tablet Take 1 tablet (40 mg total) by mouth daily. 01/20/21 01/20/22  Manuella Ghazi, Pratik D, DO  patiromer (VELTASSA) 8.4 g packet Take 2 packets (16.8 g total) by mouth daily. Patient not taking: Reported on 11/01/2021 06/25/21   Lorella Nimrod, MD  predniSONE (DELTASONE) 20 MG tablet Take 1 tablet (20 mg total) by mouth daily. Patient taking differently: Take 10 mg by mouth daily. 06/25/21   Lorella Nimrod, MD  sodium bicarbonate 650 MG tablet Take 2 tablets (1,300 mg  total) by mouth 2 (two) times daily. Patient not taking: Reported on 11/01/2021 06/24/21   Lorella Nimrod, MD  VELPHORO 500 MG chewable tablet Chew 500 mg by mouth 3 (three) times daily with meals. 10/23/21   [provider]      Allergies    Patient has no known allergies.    Review of Systems   Review of Systems  Respiratory:  Positive for shortness of breath.   All other systems reviewed and are negative.   Physical Exam Updated Vital Signs BP (!) 115/59   Pulse (!) 114   Resp (!) 24   Ht 1.676 m (5\' 6" )   Wt 68 kg   SpO2 100%   BMI 24.20 kg/m   Physical Exam Vitals and nursing note reviewed.  Constitutional:      General: She is not in acute distress.    Appearance: She is well-developed.  HENT:     Head: Normocephalic and atraumatic.     Mouth/Throat:     Pharynx: No oropharyngeal exudate.  Eyes:     General: No scleral icterus.       Right eye: No discharge.        Left eye: No discharge.     Conjunctiva/sclera: Conjunctivae normal.     Pupils: Pupils are equal, round, and reactive to light.  Neck:     Thyroid: No thyromegaly.     Vascular: No JVD.  Cardiovascular:     Rate and Rhythm: Normal rate and regular rhythm.     Heart sounds: Normal heart sounds. No murmur heard.    No friction rub. No gallop.     Comments: Fistula in the left upper extremity with a good thrill, no overlying redness Pulmonary:     Effort: Pulmonary effort is normal. No respiratory distress.     Breath sounds: Rales present. No wheezing.     Comments: Speaks in full sentences, no respiratory distress, oxygen of 98% on room air, rales at the left base, clear on the right Abdominal:     General: Bowel sounds are normal. There is no distension.     Palpations: Abdomen is soft. There is no mass.     Tenderness: There is no abdominal tenderness.  Musculoskeletal:        General: No tenderness. Normal range of motion.     Cervical back: Normal range of motion and neck supple.     Right lower leg: Edema present.     Left lower leg: Edema present.     Comments: Symmetrical 1+ pitting edema in the pretibial region  Lymphadenopathy:     Cervical: No cervical adenopathy.  Skin:    General: Skin is warm and dry.     Findings: No erythema or rash.  Neurological:     General: No focal deficit present.     Mental Status: She is alert.     Coordination: Coordination normal.  Psychiatric:        Behavior: Behavior normal.     ED Results / Procedures / Treatments   Labs (all labs ordered are listed, but only abnormal results are  displayed) Labs Reviewed  RESP PANEL BY RT-PCR (RSV, FLU A&B, COVID)  [id number]    EKG EKG Interpretation  Date/Time:  Sunday May 05 2022 21:15:51 EDT Ventricular Rate:  114 PR Interval:    QRS Duration: 145 QT Interval:  394 QTC Calculation: 543 R Axis:   -62 Text Interpretation: Atrial flutter/fibrillation Nonspecific IVCD with LAD LVH with secondary  repolarization abnormality Since last tracing rate faster Confirmed by Noemi Chapel [phone]) on 05/05/2022 9:18:57 PM  Radiology DG Chest Port 1 View  Result Date: 05/05/2022 CLINICAL DATA:  Shortness of breath EXAM: PORTABLE CHEST 1 VIEW COMPARISON:  06/27/2021, 02/15/2021 FINDINGS: Cardiomegaly with globular cardiac configuration. Mild central congestion and interstitial edema. Airspace disease at left base. No pleural effusion or pneumothorax. Aortic atherosclerosis IMPRESSION: 1. Cardiomegaly with central congestion and interstitial edema. Airspace disease at the left base may be due to atelectasis or pneumonia. Electronically Signed   By: Donavan Foil M.D.   On: 05/05/2022 21:45    Procedures Procedures    Medications Ordered in ED Medications  albuterol (VENTOLIN HFA) 108 (90 Base) MCG/ACT inhaler 2 puff (has no administration in time range)  amoxicillin-clavulanate (AUGMENTIN) 875-125 MG per tablet 1 tablet (has no administration in time range)    ED Course/ Medical Decision Making/ A&P                             Medical Decision Making Amount and/or Complexity of Data Reviewed Radiology: ordered.  Risk Prescription drug management.   Overall the patient is well-appearing, she does have focal signs and symptoms that could be consistent with having an early pneumonia.  She is not hypoxic, she has a normal respiratory rate for me and her heart rate is 98 to 100 bpm.  She is not wheezing but does have some focal rales, she will be given Augmentin, she is agreeable to the plan, she does not want to be admitted to the  hospital and I do not think that that would help at this time.  She is stable for discharge and understands indications for return  Imaging: I personally viewed and interpreted the x-ray of the chest which shows a possible airspace opacity at the left base, there is some cardiomegaly and congestion.  I agree with the radiologist interpretation.  ED course: Given Augmentin prior to discharge  Cardiac monitoring: Normal sinus rhythm  EKG: Shows sinus tachycardia, wide-complex QRS, similar to prior, no signs of acute MI         Final Clinical Impression(s) / ED Diagnoses Final diagnoses:  Community acquired pneumonia of left lung, unspecified part of lung    Rx / DC Orders ED Discharge Orders          Ordered    amoxicillin-clavulanate (AUGMENTIN) 875-125 MG tablet  Every 12 hours        05/05/22 2215              Noemi Chapel, MD 05/05/22 2219

## 2022-05-05 NOTE — Discharge Instructions (Signed)
You likely have an early pneumonia.  Please take Augmentin twice a day.  This will help to treat the infection.  I want you to return to your doctor for a recheck in 2 days but come back to the emergency department immediately for any severe or worsening symptoms

## 2022-05-05 NOTE — ED Triage Notes (Signed)
[facility] cc of n/v/d + cough. Symptoms started 4 hours ago.  L side restricted dialysis tu/th/sat

## 2022-05-05 NOTE — ED Notes (Signed)
ED Provider at bedside. 

## 2022-05-19 ENCOUNTER — Other Ambulatory Visit: Payer: Self-pay | Admitting: Cardiovascular Disease

## 2022-08-23 ENCOUNTER — Other Ambulatory Visit: Payer: Self-pay | Admitting: Cardiovascular Disease

## 2022-08-26 ENCOUNTER — Other Ambulatory Visit: Payer: Self-pay | Admitting: Cardiovascular Disease

## 2022-08-27 ENCOUNTER — Other Ambulatory Visit: Payer: Self-pay

## 2022-10-14 ENCOUNTER — Ambulatory Visit: Payer: 59 | Admitting: Cardiology

## 2022-10-14 ENCOUNTER — Encounter: Payer: Self-pay | Admitting: Cardiology

## 2022-10-14 VITALS — BP 88/44 | HR 51 | Ht 67.0 in | Wt 147.0 lb

## 2022-10-14 DIAGNOSIS — E7800 Pure hypercholesterolemia, unspecified: Secondary | ICD-10-CM

## 2022-10-14 DIAGNOSIS — I959 Hypotension, unspecified: Secondary | ICD-10-CM | POA: Diagnosis not present

## 2022-10-14 DIAGNOSIS — Z01818 Encounter for other preprocedural examination: Secondary | ICD-10-CM

## 2022-10-14 DIAGNOSIS — I4892 Unspecified atrial flutter: Secondary | ICD-10-CM

## 2022-10-14 MED ORDER — MIDODRINE HCL 5 MG PO TABS: 5.0000 mg | ORAL_TABLET | Freq: Three times a day (TID) | ORAL | 3 refills | Status: DC

## 2022-10-14 NOTE — Patient Instructions (Signed)
Medication Instructions:   START Midodrine - Take one tablet ( 5mg ) by mouth three times a day.    *If you need a refill on your cardiac medications before your next appointment, please call your pharmacy*   Lab Work:  1., None Ordered  If you have labs (blood work) drawn today and your tests are completely normal, you will receive your results only by: MyChart Message (if you have MyChart) OR A paper copy in the mail If you have any lab test that is abnormal or we need to change your treatment, we will call you to review the results.   Testing/Procedures:  Your physician has requested that you have an echocardiogram. Echocardiography is a painless test that uses sound waves to create images of your heart. It provides your doctor with information about the size and shape of your heart and how well your heart's chambers and valves are working. This procedure takes approximately one hour. There are no restrictions for this procedure. Please do NOT wear cologne, perfume, aftershave, or lotions (deodorant is allowed). Please arrive 15 minutes prior to your appointment time.    Follow-Up: At [facility], you and your health needs are our priority.  As part of our continuing mission to provide you with exceptional heart care, we have created designated Provider Care Teams.  These Care Teams include your primary Cardiologist (physician) and Advanced Practice Providers (APPs -  Physician Assistants and Nurse Practitioners) who all work together to provide you with the care you need, when you need it.  We recommend signing up for the patient portal called "MyChart".  Sign up information is provided on this After Visit Summary.  MyChart is used to connect with patients for Virtual Visits (Telemedicine).  Patients are able to view lab/test results, encounter notes, upcoming appointments, etc.  Non-urgent messages can be sent to your provider as well.   To learn more about what you can do  with MyChart, go to [email or web address].    Your next appointment:    After Echocardiogram   Provider:   You may see Debbe Odea, MD or one of the following Advanced Practice Providers on your designated Care Team:   Nicolasa Ducking, NP Eula Listen, PA-C Cadence Fransico Michael, PA-C Charlsie Quest, NP
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
## 2022-11-07 ENCOUNTER — Other Ambulatory Visit: Payer: Self-pay | Admitting: Cardiovascular Disease

## 2022-11-08 ENCOUNTER — Encounter: Payer: Self-pay | Admitting: Internal Medicine

## 2022-11-11 ENCOUNTER — Ambulatory Visit: Payer: 59 | Attending: Cardiology

## 2022-11-11 DIAGNOSIS — I4892 Unspecified atrial flutter: Secondary | ICD-10-CM

## 2022-11-12 LAB — ECHOCARDIOGRAM COMPLETE
Area-P 1/2: 3.85 cm2
S' Lateral: 2 cm

## 2022-11-18 ENCOUNTER — Ambulatory Visit: Payer: 59 | Attending: Cardiology | Admitting: Cardiology

## 2022-11-18 ENCOUNTER — Encounter: Payer: Self-pay | Admitting: Cardiology

## 2022-11-18 VITALS — BP 138/50 | HR 53 | Ht 66.0 in | Wt 143.2 lb

## 2022-11-18 DIAGNOSIS — I5032 Chronic diastolic (congestive) heart failure: Secondary | ICD-10-CM

## 2022-11-18 DIAGNOSIS — I4892 Unspecified atrial flutter: Secondary | ICD-10-CM

## 2022-11-18 DIAGNOSIS — I959 Hypotension, unspecified: Secondary | ICD-10-CM

## 2022-11-18 DIAGNOSIS — N186 End stage renal disease: Secondary | ICD-10-CM

## 2022-11-18 DIAGNOSIS — E782 Mixed hyperlipidemia: Secondary | ICD-10-CM

## 2022-11-18 DIAGNOSIS — Z01818 Encounter for other preprocedural examination: Secondary | ICD-10-CM | POA: Diagnosis not present

## 2022-11-18 NOTE — Patient Instructions (Signed)
Medication Instructions:  Your physician recommends that you continue on your current medications as directed. Please refer to the Current Medication list given to you today.  *If you need a refill on your cardiac medications before your next appointment, please call your pharmacy*  Lab Work: -None ordered  Testing/Procedures: Your provider has ordered a Lexiscan Myoview Stress test. This will take place at [facility]. Please report to the [facility] medical mall entrance. The volunteers at the first desk will direct you where to go.  [facility] MYOVIEW  Your provider has ordered a Stress Test with nuclear imaging. The purpose of this test is to evaluate the blood supply to your heart muscle. This procedure is referred to as a "Non-Invasive Stress Test." This is because other than having an IV started in your vein, nothing is inserted or "invades" your body. Cardiac stress tests are done to find areas of poor blood flow to the heart by determining the extent of coronary artery disease (CAD). Some patients exercise on a treadmill, which naturally increases the blood flow to your heart, while others who are unable to walk on a treadmill due to physical limitations will have a pharmacologic/chemical stress agent called Lexiscan . This medicine will mimic walking on a treadmill by temporarily increasing your coronary blood flow.   Please note: these test may take anywhere between 2-4 hours to complete  How to prepare for your Myoview test:  Nothing to eat for 6 hours prior to the test No caffeine for 24 hours prior to test No smoking 24 hours prior to test. Your medication may be taken with water.  If your doctor stopped a medication because of this test, do not take that medication. Ladies, please do not wear dresses.  Skirts or pants are appropriate. Please wear a short sleeve shirt. No perfume, cologne or lotion. Wear comfortable walking shoes. No heels!  PLEASE NOTIFY THE OFFICE AT LEAST 24 HOURS IN ADVANCE  IF YOU ARE UNABLE TO KEEP YOUR APPOINTMENT.  [phone] AND  PLEASE NOTIFY NUCLEAR MEDICINE AT [facility] AT LEAST 24 HOURS IN ADVANCE IF YOU ARE UNABLE TO KEEP YOUR APPOINTMENT. [phone]   Follow-Up: At [facility], you and your health needs are our priority.  As part of our continuing mission to provide you with exceptional heart care, we have created designated Provider Care Teams.  These Care Teams include your primary Cardiologist (physician) and Advanced Practice Providers (APPs -  Physician Assistants and Nurse Practitioners) who all work together to provide you with the care you need, when you need it.  Your next appointment:   Follow-up after testing   Provider:   You may see Debbe Odea, MD or one of the following Advanced Practice Providers on your designated Care Team:   Nicolasa Ducking, NP Eula Listen, PA-C Cadence Fransico Michael, PA-C Charlsie Quest, NP    Other Instructions -None
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
## 2022-11-25 ENCOUNTER — Encounter
Admission: RE | Admit: 2022-11-25 | Discharge: 2022-11-25 | Disposition: A | Discharge status: Home / Self Care | Payer: 59 | Source: Ambulatory Visit | Attending: Cardiology | Admitting: Cardiology

## 2022-11-25 DIAGNOSIS — I4892 Unspecified atrial flutter: Secondary | ICD-10-CM | POA: Insufficient documentation

## 2022-11-25 LAB — NM MYOCAR MULTI W/SPECT W/WALL MOTION / EF
LV dias vol: 95 mL (ref 46–106)
LV sys vol: 47 mL
Nuc Stress EF: 51 %
Peak HR: 61 {beats}/min
Rest HR: 49 {beats}/min
Rest Nuclear Isotope Dose: 10.5 mCi
SDS: 0
SRS: 5
SSS: 9
ST Depression (mm): 0 mm
Stress Nuclear Isotope Dose: 28.1 mCi
TID: 1.06

## 2022-11-25 MED ORDER — REGADENOSON 0.4 MG/5ML IV SOLN
0.4000 mg | Freq: Once | INTRAVENOUS | Status: AC
  Administered 2022-11-25: 0.4 mg via INTRAVENOUS
  Filled 2022-11-25: qty 5

## 2022-11-25 MED ORDER — TECHNETIUM TC 99M TETROFOSMIN IV KIT
30.0000 | PACK | Freq: Once | INTRAVENOUS | Status: AC
  Administered 2022-11-25: 28.1 via INTRAVENOUS

## 2022-11-25 MED ORDER — TECHNETIUM TC 99M TETROFOSMIN IV KIT
10.0000 | PACK | Freq: Once | INTRAVENOUS | Status: AC
  Administered 2022-11-25: 10.51 via INTRAVENOUS

## 2022-11-26 NOTE — Progress Notes (Signed)
Stress testing was considered a low risk study.  No evidence of ischemia or infarction.

## 2022-11-29 IMAGING — US US BIOPSY
1 series · 6 of 6 positions shown · non-contrast
Comparison: 03/06/2021

INDICATION: 70-year-old female with history of acute/subacute kidney injury.

EXAM:
ULTRASOUND GUIDED RENAL BIOPSY

[Series 1: us biopsy (kidney) · 6 of 6 slices shown]
[im 1/6]
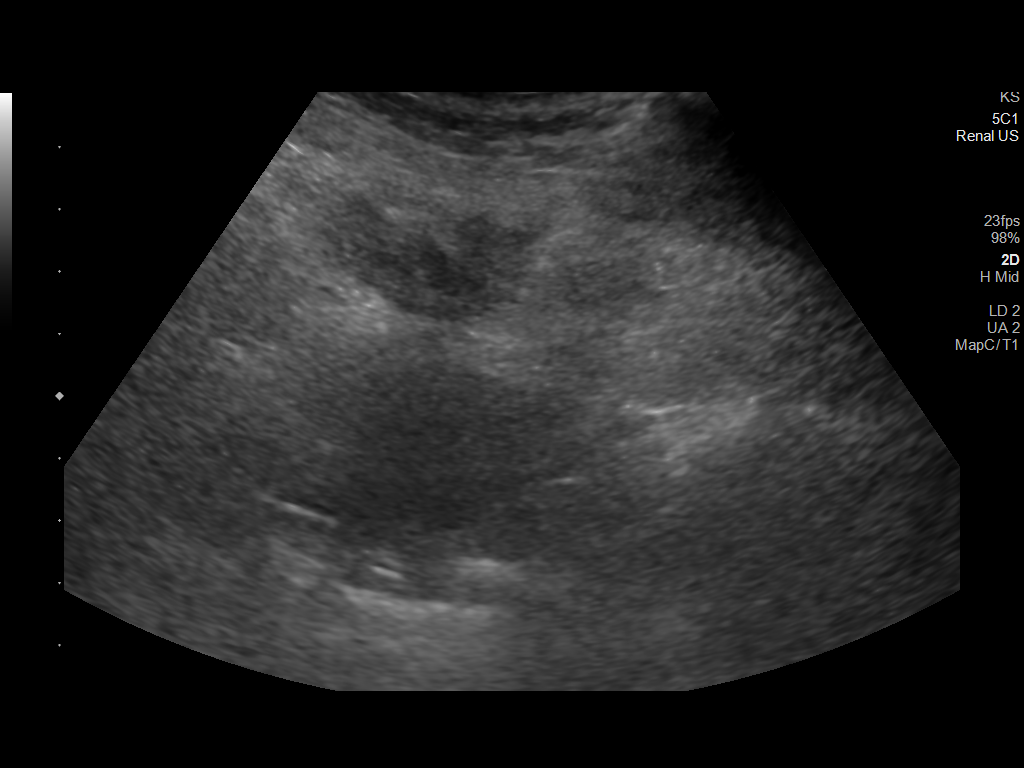
[im 2/6]
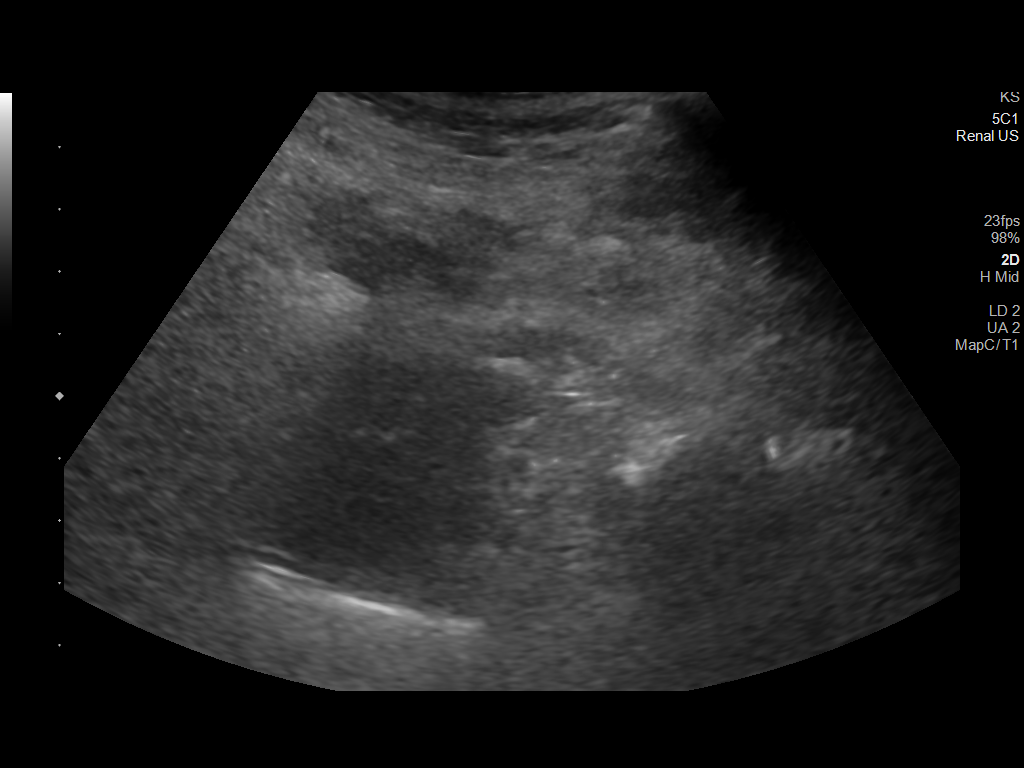
[im 3/6]
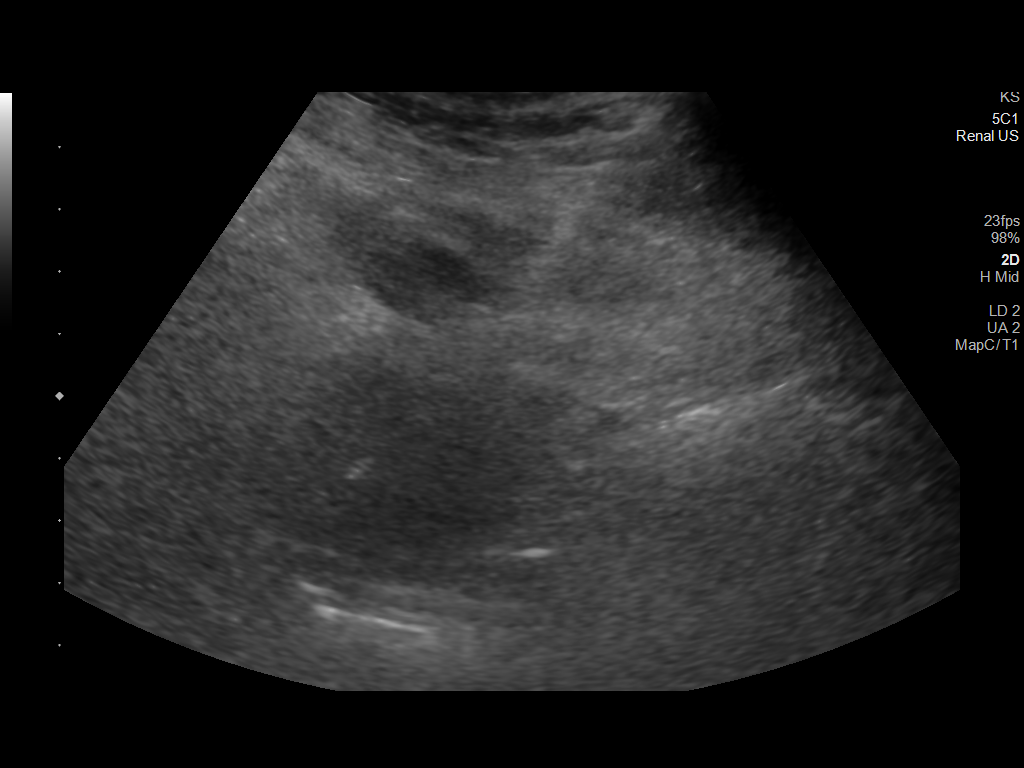
[im 4/6]
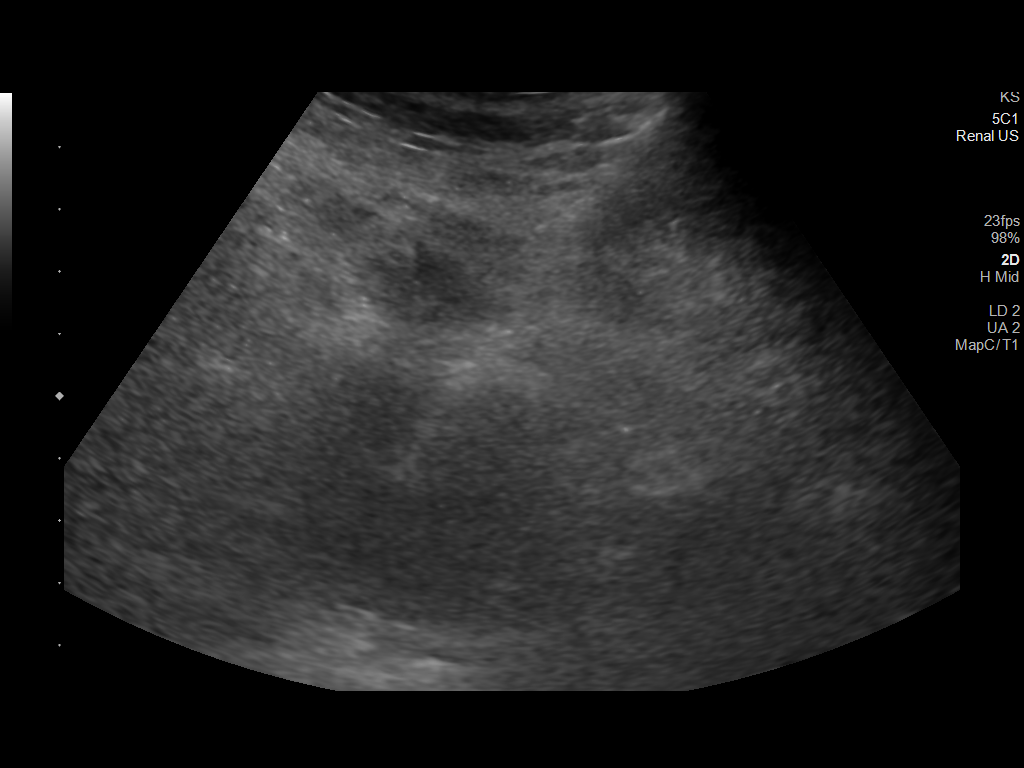
[im 5/6]
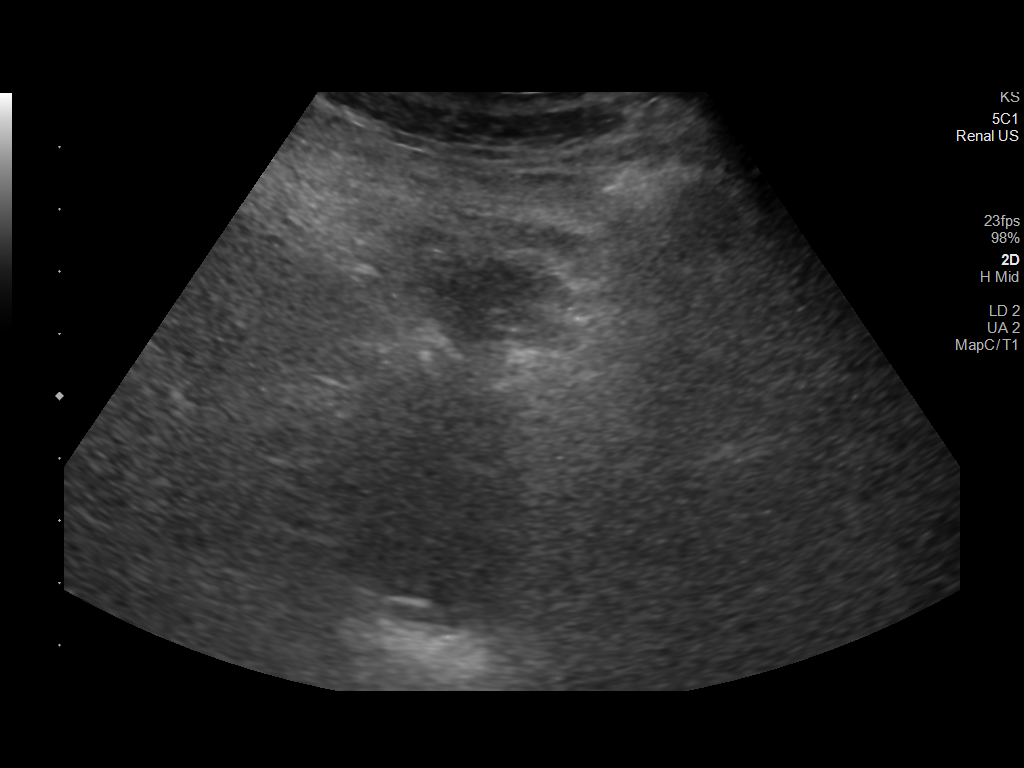
[im 6/6]
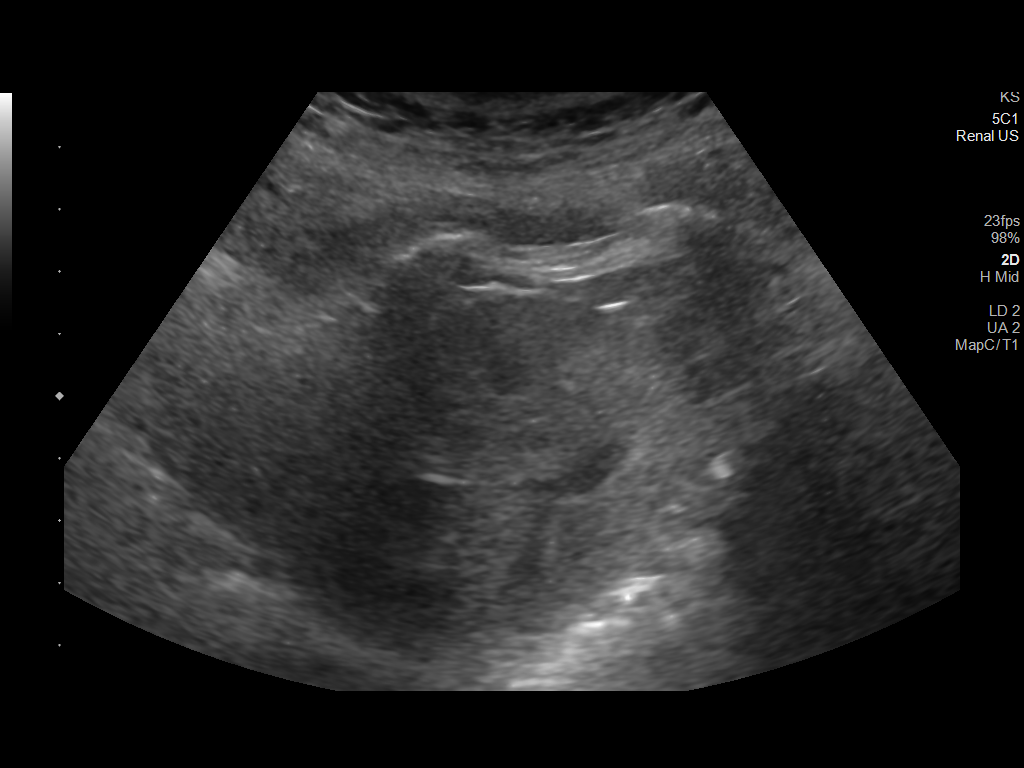

[6 of 6 positions shown; findings below may reference images not displayed]

MEDICATIONS:
None.

ANESTHESIA/SEDATION:
Fentanyl 25 mcg IV; Versed 1 mg IV

Total Moderate Sedation time: 11 minutes; The patient was
continuously monitored during the procedure by the interventional
radiology nurse under my direct supervision.

COMPLICATIONS:
None immediate.

PROCEDURE:
Informed written consent was obtained from the patient after a
discussion of the risks, benefits and alternatives to treatment. The
patient understands and consents the procedure. A timeout was
performed prior to the initiation of the procedure.

Ultrasound scanning was performed of the bilateral flanks. The
inferior pole of the right kidney was selected for biopsy due to
location and sonographic window. The procedure was planned. The
operative site was prepped and draped in the usual sterile fashion.
The overlying soft tissues were anesthetized with 1% lidocaine with
epinephrine. A 15 gauge coaxial introducer needle was advanced to
the inferior cortex of the right kidney followed by introduction of
a 16 gauge core needle biopsy device, then 2 core biopsies were
obtained under direct ultrasound guidance. Images were saved for
documentation purposes. The biopsy device was removed and hemostasis
was obtained by injection of a Gel-Foam slurry along the needle
tract under ultrasound guidance and manual compression. Post
procedural scanning was negative for significant post procedural
hemorrhage or additional complication. A dressing was placed. The
patient tolerated the procedure well without immediate post
procedural complication.
IMPRESSION: Technically successful ultrasound guided right renal biopsy.

## 2022-12-11 ENCOUNTER — Ambulatory Visit: Payer: 59 | Admitting: Cardiology

## 2023-01-03 ENCOUNTER — Ambulatory Visit: Payer: 59 | Attending: Cardiology | Admitting: Cardiology

## 2023-01-03 NOTE — Progress Notes (Deleted)
Cardiology Office Note:  .   Date:  01/03/2023  ID:  Mackenzie [name], DOB 07-01-1950, MRN [id number] PCP: Smith Robert, MD  [facility] Providers Cardiologist:  Debbe Odea, MD { Click to update primary MD,subspecialty MD or APP then REFRESH:1}   History of Present Illness: .   Mackenzie [name] is a 72 y.o. 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   Cardiac history dating back to 2014 and was previously followed at [facility].  Echocardiogram in 2014 [facility] revealed LVEF 55 to 60%, mild AI.  Echo 12/2020 revealed an EF of 60 to 65%.  She presented to [facility] regional and was diagnosed with atrial fibrillation approximately 2 years ago and has been on apixaban since that time.  Last echocardiogram was completed 11/11/2022 with an LVEF of 60 to 65%, no RWMA, mild mitral valve regurgitation with moderate mitral annular calcification, mild to moderate TR, and aortic valve sclerosis without stenosis and mild aortic valve regurg.  She was then seen in clinic in August 2021 for hypotension and bradycardia.  She was scheduled for an echocardiogram and a Lexiscan Myoview.  At that time she was also started on midodrine to help with her blood pressures.   She was last seen in clinic 11/08/2022 stating that she been doing well.  Denied any chest pain or chest tightness palpitations or shortness of breath.  Had not had any episodes of bleeding and had been continued on apixaban.  She was scheduled for Lexiscan Myoview to continue her workup for being considered for kidney transplant.  None of her medications were changed.  She was encouraged to continue to keep her appointments with the EP.  She returns to clinic  today   ROS: 10 point review of system has been reviewed and considered negative except what is listed in HPI  Studies Reviewed: Marland Kitchen       Lexiscan MPI 11/25/22   The study is normal. The study is low risk.   No ST deviation was noted.   LV perfusion is normal. There is no evidence of ischemia. There is no evidence of infarction.   Left ventricular function is normal. Nuclear stress EF: 51%. The left ventricular ejection fraction is normal (55-65%). End diastolic cavity size is normal. End systolic cavity size is normal.   This is a very poor and suboptimal study due to intense GI uptake interfering with the inferior wall.  Consider alternative testing.   CT attenuation images showed significant aortic and coronary calcifications.  TTE 11/11/22 1. Left ventricular ejection fraction, by estimation, is 60 to 65%. The  left ventricle has normal function. The left ventricle has no regional  wall motion abnormalities. Left ventricular diastolic parameters are  indeterminate. The average left  ventricular global longitudinal strain is -17.9 %. The global longitudinal  strain is normal.   2. Right ventricular systolic function is normal. The right ventricular  size is normal.   3. Left atrial size was severely dilated.   4. Right atrial size was mildly dilated.   5. The mitral valve is degenerative. Mild mitral valve regurgitation.  Moderate mitral annular calcification.   6. Tricuspid valve regurgitation is mild to moderate.   7. The aortic valve is calcified. Aortic valve regurgitation is mild.  Aortic valve sclerosis/calcification is present, without any evidence of  aortic stenosis.   8. The inferior vena cava is normal in size with greater than 50%  respiratory variability, suggesting right atrial pressure of 3 mmHg.    TTE 01/16/21 1. Left ventricular ejection fraction, by estimation, is 60 to 65%. The  left ventricle has normal function. The left ventricle has no regional  wall  motion abnormalities. There is moderate asymmetric left ventricular  hypertrophy of the basal-septal  segment. Left ventricular diastolic parameters are consistent with Grade  II diastolic dysfunction (pseudonormalization).   2. Right ventricular systolic function is normal. The right ventricular  size is normal. There is mildly elevated pulmonary artery systolic  pressure. The estimated right ventricular systolic pressure is 41.2 mmHg.   3. Left atrial size was moderately dilated.   4. The mitral valve is abnormal. Trivial mitral valve regurgitation.   5. The aortic valve is tricuspid. Aortic valve regurgitation is mild.  Aortic valve sclerosis/calcification is present, without any evidence of  aortic stenosis. There is mild calcification of the aortic valve leaflets  most notably on the LCC.   6. The inferior vena cava is normal in size with greater than 50%  respiratory variability, suggesting right atrial pressure of 3 mmHg.  Risk Assessment/Calculations:    CHA2DS2-VASc Score = 6   This indicates a 9.7% annual risk of stroke. The patient's score is based upon: CHF History: 1 HTN History: 1 Diabetes History: 1 Stroke History: 0 Vascular Disease History: 1 Age Score: 1 Gender Score: 1   {This patient has a significant risk of stroke if diagnosed with atrial fibrillation.  Please consider VKA or DOAC agent for anticoagulation if the bleeding risk is acceptable.   You can also use the SmartPhrase .[email or web address] for documentation.   :[id number]} No BP recorded.  {Refresh Note OR Click here to enter BP  :1}***       Physical Exam:   VS:  There were no vitals taken for this visit.   Wt Readings from Last 3 Encounters:  11/18/22 143 lb 3.2 oz (65 kg)  10/14/22 147 lb (66.7 kg)  05/05/22 149 lb 14.6 oz (68 kg)    GEN: Well nourished, well developed in no acute distress NECK: No JVD; No carotid bruits CARDIAC: ***RRR, no murmurs, rubs, gallops RESPIRATORY:  Clear to auscultation  without rales, wheezing or rhonchi  ABDOMEN: Soft, non-tender, non-distended EXTREMITIES:  No edema; No deformity   ASSESSMENT AND PLAN: .   ***    {Are you ordering a CV Procedure (e.g. stress test, cath, DCCV, TEE, etc)?   Press F2        :[id number]}  Dispo: ***  Signed, Calah Gershman, NP

## 2023-01-06 ENCOUNTER — Other Ambulatory Visit: Payer: Self-pay | Admitting: *Deleted

## 2023-01-06 MED ORDER — MIDODRINE HCL 5 MG PO TABS: 5.0000 mg | ORAL_TABLET | Freq: Three times a day (TID) | ORAL | 0 refills | Status: DC

## 2023-01-21 NOTE — Progress Notes (Unsigned)
  Electrophysiology Office Note:    Date:  01/21/2023   ID:  Mackenzie [name], Mackenzie [name] 09/07/1950, MRN [id number]  [facility] Cardiologist:  Debbe Odea, MD  [facility] Electrophysiologist:  Lanier Prude, MD   Referring MD: Debbe Odea, MD   Chief Complaint: Atrial flutter  History of Present Illness:    Mackenzie [name] is a 72 year old woman who I am seeing today for an evaluation of atrial flutter at the request of Dr. Azucena Cecil.  The patient has a history of end-stage renal disease on intermittent hemodialysis, hyperlipidemia, atrial fibrillation and flutter.  She uses a walker to ambulate short distances and wheelchair for longer distances.  Her diagnosis of atrial fibrillation was made about 2 years ago.  At the appointment with Dr. Azucena Cecil on October 14, 2022 the patient denied palpitations, shortness of breath or chest pain.  She is being evaluated at [facility] for possible kidney transplant.  At the appointment with Dr. Azucena Cecil she was started on midodrine given hypotension and systolic blood pressures in the 80s.  She saw Cordelia Pen in follow-up on November 18, 2022.  At that appointment she denied symptoms of shortness of breath, presyncope or syncope.  No palpitations.  She is on Eliquis for stroke prophylaxis.  She has a left bundle branch block.  Discussed the use of AI scribe software for clinical note transcription with the patient, who gave verbal consent to proceed.  History of Present Illness                   Their past medical, social and family history was reveiwed.   ROS:   Please see the history of present illness.    All other systems reviewed and are negative.  EKGs/Labs/Other Studies Reviewed:    The following studies were reviewed today:  November 12, 2022 echo EF 60 to 65% RV function normal Mild MR Mild to moderate TR  November 25, 2022 SPECT Nondiagnostic  November 18, 2022 EKG shows atrial flutter with variable AV  conduction.       Physical Exam:    VS:  There were no vitals taken for this visit.    Wt Readings from Last 3 Encounters:  11/18/22 143 lb 3.2 oz (65 kg)  10/14/22 147 lb (66.7 kg)  05/05/22 149 lb 14.6 oz (68 kg)   GEN: no distress CARD: Irregularly irregular, No MRG RESP: No IWOB. CTAB.       ASSESSMENT AND PLAN:    No diagnosis found.   #Atrial flutter and atrial fibrillation Persistent.  Longstanding.  Asymptomatic.  Associated with normal left ventricular function.  At this time, I would not recommend pursuing rhythm control.  Continue Eliquis for stroke prophylaxis  #ESRD Undergoing kidney transplant evaluation at [facility].  #Hypotension Continue midodrine as prescribed      Signed, Sheria Lang T. Lalla Brothers, MD, [facility], [facility] 01/21/2023 9:17 PM    Electrophysiology [facility]

## 2023-01-22 ENCOUNTER — Ambulatory Visit: Payer: 59 | Attending: Cardiology | Admitting: Cardiology

## 2023-01-22 DIAGNOSIS — I5032 Chronic diastolic (congestive) heart failure: Secondary | ICD-10-CM

## 2023-01-22 DIAGNOSIS — N186 End stage renal disease: Secondary | ICD-10-CM

## 2023-01-22 DIAGNOSIS — I4892 Unspecified atrial flutter: Secondary | ICD-10-CM

## 2023-01-23 IMAGING — DX DG CHEST 1V PORT
1 series · 1 of 1 positions shown · non-contrast
Comparison: February 15, 2021.

CLINICAL DATA: Cough, bilateral lower extremity swelling.

EXAM:
PORTABLE CHEST 1 VIEW

[chest ap]
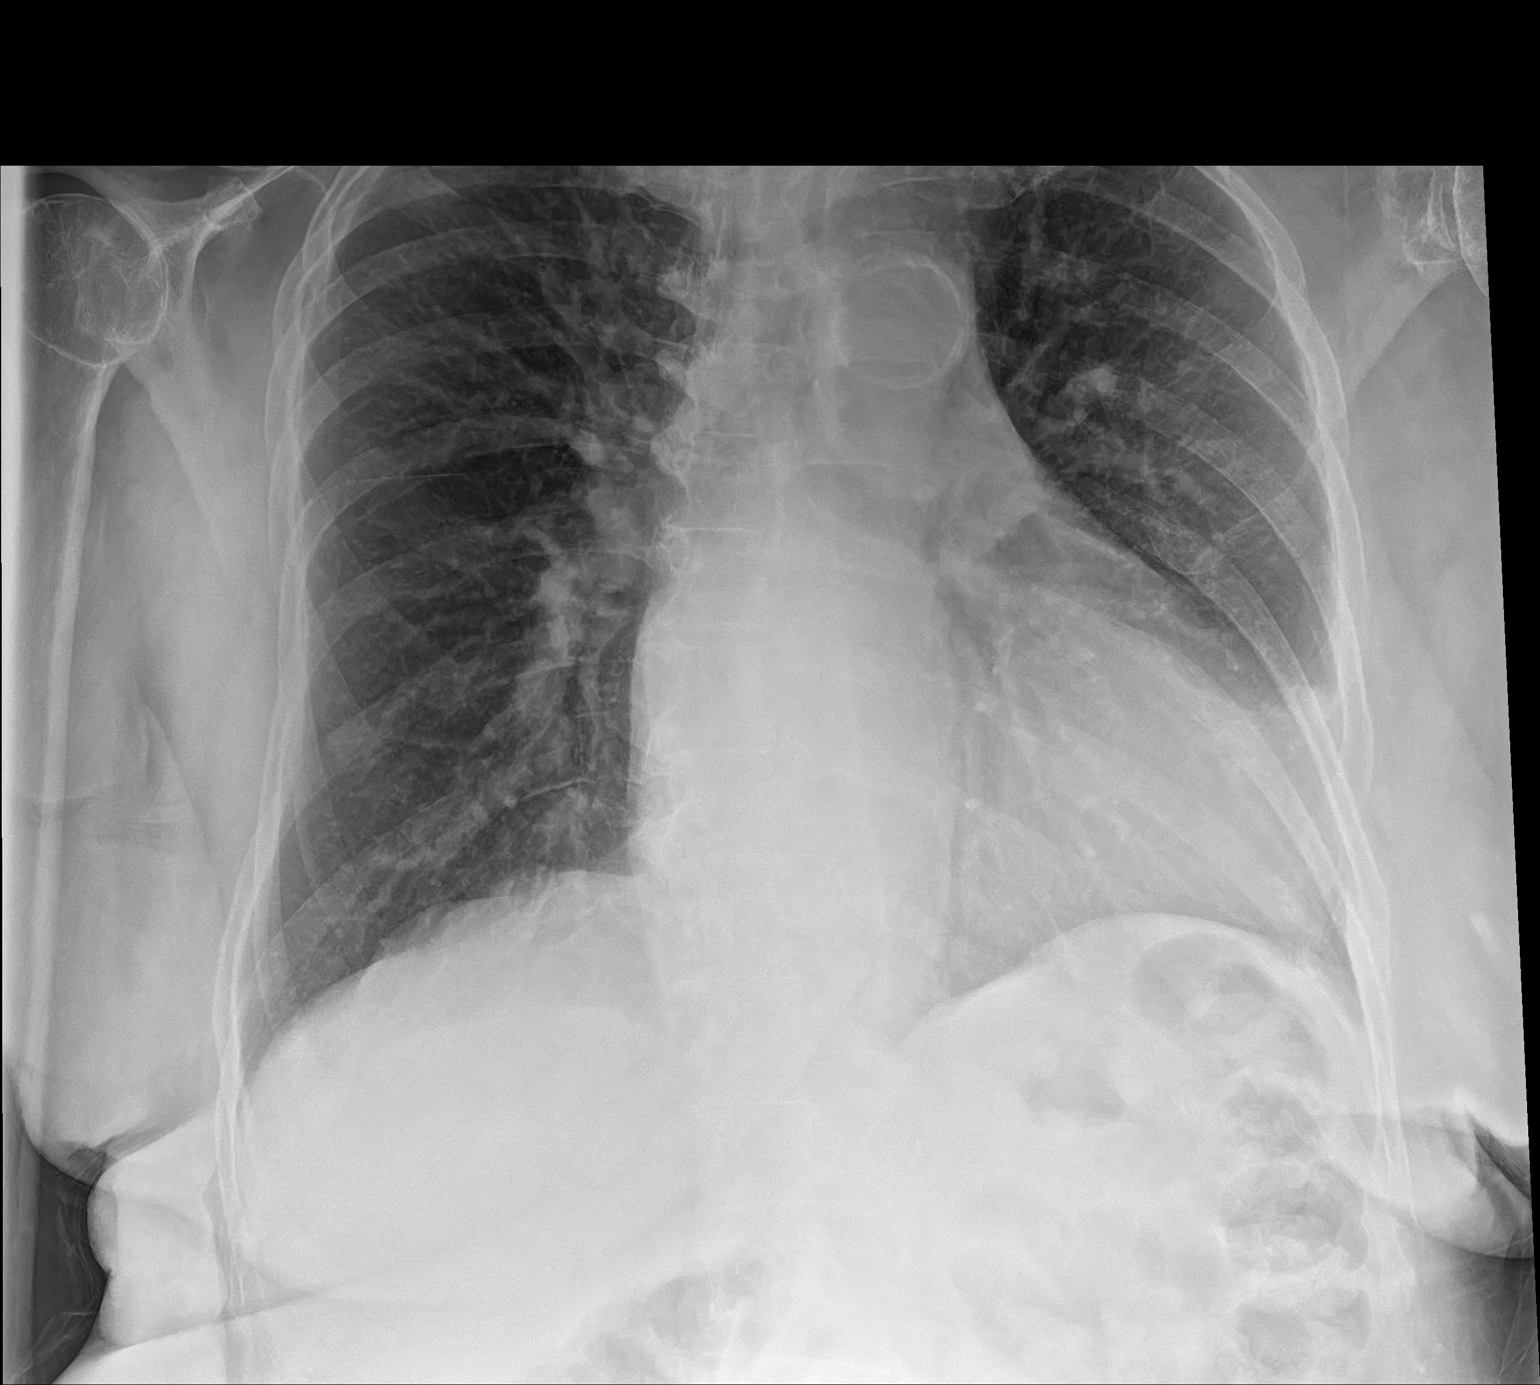

[1 of 1 positions shown; findings below may reference images not displayed]

FINDINGS: Stable cardiomegaly. Both lungs are clear. The visualized skeletal
structures are unremarkable.
IMPRESSION: No active disease.

Aortic Atherosclerosis ([id number]).

## 2023-01-31 ENCOUNTER — Other Ambulatory Visit: Payer: Self-pay | Admitting: Cardiovascular Disease

## 2023-02-04 ENCOUNTER — Other Ambulatory Visit: Payer: Self-pay | Admitting: *Deleted

## 2023-02-04 MED ORDER — MIDODRINE HCL 5 MG PO TABS: 5.0000 mg | ORAL_TABLET | Freq: Three times a day (TID) | ORAL | 0 refills | Status: AC

## 2023-02-04 NOTE — Telephone Encounter (Signed)
Left voice mail

## 2023-02-06 NOTE — Telephone Encounter (Signed)
Appointment scheduled for 03/03/23

## 2023-03-01 NOTE — Progress Notes (Signed)
 Cardiology Clinic Note   Date: 03/03/2023 ID: Reita, Shindler [birth year], MRN [id number]  Primary Cardiologist:  Redell Cave, MD  Patient Profile    Mackenzie [name] is a 73 y.o. female who presents to the clinic today for follow up after testing.     Past medical history significant for: Nonobstructive CAD. LHC 05/06/2016 (NSTEMI): Proximal to mid RCA 30%.  Ostial OM1-OM1 40%.  Proximal LCx 40%.  Distal LAD 40%. Nuclear stress test 11/25/2022: Normal, low risk study.  No ST deviation noted.  No evidence of ischemia or infarction.  Study was very poor and suboptimal due to intense GI uptake interfering with the inferior wall.  CT attenuation images showed significant aortic and coronary calcifications. Chronic systolic heart failure with recovered LV function. Echo 11/11/2022: EF 60 to 65%.  No RWMA.  Indeterminate diastolic parameters.  Normal global strain.  Normal RV size/function.  Severe LAE.  Mild RAE.  Mild MR/AI.  Moderate MAC.  Mild to moderate TR.  Aortic valve calcification/sclerosis without stenosis. PAF/a-flutter. Onset March 2018 in the setting of influenza and NSTEMI. LBBB. Hypertension. T2DM. Hypothyroidism. ESRD. ANCA vasculitis. Hemodialysis TThSa.  In summary, patient presented to [facility] ED via EMS on 05/01/2016 with shortness of breath.  Initial SpO2 in the 70s, BP 219/140 per EMS.  She denies chest pain or lower extremity edema.  Patient admitted to medication nonadherence.  Initial labs: Sodium 139, potassium 3.2, creatinine 0.94, BUN 18, WBC 16.9, hemoglobin 13.2, BNP 367.  Troponin 0.19>> 3.51>> 3.52.  Positive for influenza B.  Echo showed EF 35 to 40%, regional wall motion abnormalities could not be excluded, Grade I DD, mild MR, mild LAE, mild RVH.  Left heart cath revealed nonobstructive CAD as detailed above.  During hospitalization patient developed A-fib with RVR.  Her rate was controlled with Cardizem  and she was started on anticoagulation.  Patient was  discharged on 05/07/2016 on GDMT for CAD and HFrEF.  Repeat echo November 2022 showed EF 60 to 65%.  Patient was first evaluated by Dr. Cave on 10/14/2022 to establish care at the request of Dr. Pecolia.  Patient was being considered for possible renal transplant and needed pretransplant workup with echo and stress testing.  Echo showed EF 60 to 65% as detailed above.       History of Present Illness    Mackenzie [name] is followed by Dr. Cave for the above outlined history.  Patient was last seen in the office on 11/18/2022 by Tylene Lunch, NP for follow-up after testing.  EKG revealed a flutter with variable block and LBBB.  She was pending EP visit.  Nuclear stress test was ordered and was a normal, low risk study.  Today, patient is accompanied by her granddaughter. She reports she is doing well. Patient denies shortness of breath, dyspnea on exertion, lower extremity edema, orthopnea or PND. No chest pain, pressure, or tightness. No palpitations. She tolerates dialysis well. She is taking midodrine  3 times a day with good tolerance. Dialysis labs are reviewed today and are stable (will scan into chart). Patient still makes urine. No blood in stool or urine.     ROS: All other systems reviewed and are otherwise negative except as noted in History of Present Illness.  EKGs/Labs Reviewed    EKG Interpretation Date/Time:  Monday March 03 2023 14:49:41 EST Ventricular Rate:  61 PR Interval:    QRS Duration:  148 QT Interval:  470 QTC Calculation: 473 R Axis:   -62  Text Interpretation: Atrial flutter with variable A-V block Left axis deviation Left bundle branch block When compared with ECG of 18-Nov-2022 10:23, No significant change was found Confirmed by Loistine Sober [phone]) on 03/03/2023 2:53:17 PM    Risk Assessment/Calculations     CHA2DS2-VASc Score = 6   This indicates a 9.7% annual risk of stroke. The patient's score is based upon: CHF History: 1 HTN  History: 1 Diabetes History: 1 Stroke History: 0 Vascular Disease History: 1 Age Score: 1 Gender Score: 1             Physical Exam    VS:  BP 138/63 (BP Location: Right Arm, Patient Position: Sitting, Cuff Size: Normal)   Pulse 61   Ht 5' 6 (1.676 m)   Wt 150 lb 6 oz (68.2 kg)   SpO2 96%   BMI 24.27 kg/m  , BMI Body mass index is 24.27 kg/m.  GEN: Well nourished, well developed, in no acute distress. Neck: No JVD or carotid bruits. Cardiac: Regularly irregular rhythm. No murmurs. No rubs or gallops.   Respiratory:  Respirations regular and unlabored. Clear to auscultation without rales, wheezing or rhonchi. GI: Soft, nontender, nondistended. Extremities: Radials/DP/PT 2+ and equal bilaterally. No clubbing or cyanosis. No edema.  Skin: Warm and dry, no rash. Neuro: Strength intact.  Assessment & Plan   Nonobstructive CAD LHC March 2018 for NSTEMI showed nonobstructive CAD in RCA, OM1, LCx, LAD.  Nuclear stress test October 2024 with a normal, low risk study.  Patient denies chest pain, pressure or tightness.  -Continue atorvastatin .  Not on aspirin  secondary to Eliquis .  Chronic systolic heart failure with recovered LV function Echo September 2024 showed EF 60 to 65%, no RWMA, severe LAE, mild RAE, mild MR/AI.  Patient denies lower extremity edema, shortness of breath, orthopnea or PND. Euvolemic and well compensated on exam. -GDMT limited secondary to ESRD and hypotension. -Volume managed by HD.  Hypotension BP today 138/63. No reported dizziness.  -Continue midodrine .  PAF/a-flutter Onset March 2018 in the setting of influenza and NSTEMI.  Patient denies palpitations. Denies spontaneous bleeding concerns. EKG today shows aflutter, 61 bpm. Regularly irregular on exam.  -Continue Eliquis . Appropriate Eliquis  dose. -Scheduled with Dr. Cindie 04/16/2023.  Disposition: Keep scheduled visit with Dr. Cindie 04/16/2023. Return in 6 months or sooner as needed.           Signed, Sober HERO. Lakeeta Dobosz, DNP, NP-C

## 2023-03-03 ENCOUNTER — Encounter: Payer: Self-pay | Admitting: Internal Medicine

## 2023-03-03 ENCOUNTER — Ambulatory Visit: Payer: 59 | Attending: Student | Admitting: Student

## 2023-03-03 ENCOUNTER — Encounter: Payer: Self-pay | Admitting: Student

## 2023-03-03 VITALS — BP 138/63 | HR 61 | Ht 66.0 in | Wt 150.4 lb

## 2023-03-03 DIAGNOSIS — I5032 Chronic diastolic (congestive) heart failure: Secondary | ICD-10-CM

## 2023-03-03 DIAGNOSIS — I480 Paroxysmal atrial fibrillation: Secondary | ICD-10-CM

## 2023-03-03 DIAGNOSIS — I4892 Unspecified atrial flutter: Secondary | ICD-10-CM

## 2023-03-03 DIAGNOSIS — I959 Hypotension, unspecified: Secondary | ICD-10-CM

## 2023-03-03 DIAGNOSIS — I2510 Atherosclerotic heart disease of native coronary artery without angina pectoris: Secondary | ICD-10-CM

## 2023-03-03 NOTE — Patient Instructions (Addendum)
 Medication Instructions:  None  *If you need a refill on your cardiac medications before your next appointment, please call your pharmacy*   Lab Work: None  If you have labs (blood work) drawn today and your tests are completely normal, you will receive your results only by: MyChart Message (if you have MyChart) OR A paper copy in the mail If you have any lab test that is abnormal or we need to change your treatment, we will call you to review the results.   Testing/Procedures: None   Follow-Up: At [facility], you and your health needs are our priority.  As part of our continuing mission to provide you with exceptional heart care, we have created designated Provider Care Teams.  These Care Teams include your primary Cardiologist (physician) and Advanced Practice Providers (APPs -  Physician Assistants and Nurse Practitioners) who all work together to provide you with the care you need, when you need it.  Keep scheduled appointment with Dr. Cindie on 04/16/23 at 10:20 am  Your next appointment:   6 month(s)  Provider:   Redell Cave, MD or Barnie Hila, NP

## 2023-03-10 ENCOUNTER — Encounter: Payer: Self-pay | Admitting: Student

## 2023-03-12 ENCOUNTER — Other Ambulatory Visit: Payer: Self-pay

## 2023-03-12 ENCOUNTER — Ambulatory Visit (HOSPITAL_COMMUNITY)
Admission: RE | Admit: 2023-03-12 | Discharge: 2023-03-12 | Disposition: A | Discharge status: Home / Self Care | Payer: 59 | Attending: Internal Medicine | Admitting: Internal Medicine

## 2023-03-12 ENCOUNTER — Encounter (HOSPITAL_COMMUNITY)
Admission: RE | Disposition: A | Discharge status: Home / Self Care | Payer: Self-pay | Source: Home / Self Care | Attending: Internal Medicine

## 2023-03-12 ENCOUNTER — Encounter (HOSPITAL_COMMUNITY): Payer: Self-pay | Admitting: Internal Medicine

## 2023-03-12 DIAGNOSIS — E1122 Type 2 diabetes mellitus with diabetic chronic kidney disease: Secondary | ICD-10-CM | POA: Insufficient documentation

## 2023-03-12 DIAGNOSIS — I4891 Unspecified atrial fibrillation: Secondary | ICD-10-CM | POA: Insufficient documentation

## 2023-03-12 DIAGNOSIS — Y832 Surgical operation with anastomosis, bypass or graft as the cause of abnormal reaction of the patient, or of later complication, without mention of misadventure at the time of the procedure: Secondary | ICD-10-CM | POA: Diagnosis not present

## 2023-03-12 DIAGNOSIS — N186 End stage renal disease: Secondary | ICD-10-CM | POA: Diagnosis not present

## 2023-03-12 DIAGNOSIS — I509 Heart failure, unspecified: Secondary | ICD-10-CM | POA: Diagnosis not present

## 2023-03-12 DIAGNOSIS — T82858A Stenosis of vascular prosthetic devices, implants and grafts, initial encounter: Secondary | ICD-10-CM | POA: Insufficient documentation

## 2023-03-12 DIAGNOSIS — Z992 Dependence on renal dialysis: Secondary | ICD-10-CM | POA: Insufficient documentation

## 2023-03-12 DIAGNOSIS — E785 Hyperlipidemia, unspecified: Secondary | ICD-10-CM | POA: Diagnosis not present

## 2023-03-12 DIAGNOSIS — I132 Hypertensive heart and chronic kidney disease with heart failure and with stage 5 chronic kidney disease, or end stage renal disease: Secondary | ICD-10-CM | POA: Diagnosis not present

## 2023-03-12 HISTORY — PX: PERIPHERAL VASCULAR BALLOON ANGIOPLASTY: CATH118281

## 2023-03-12 HISTORY — PX: A/V FISTULAGRAM: CATH118298

## 2023-03-12 SURGERY — A/V FISTULAGRAM

## 2023-03-12 MED ORDER — MIDAZOLAM HCL 2 MG/2ML IJ SOLN
INTRAMUSCULAR | Status: DC | PRN
  Administered 2023-03-12: 1 mg via INTRAVENOUS

## 2023-03-12 MED ORDER — HEPARIN (PORCINE) IN NACL 1000-0.9 UT/500ML-% IV SOLN
INTRAVENOUS | Status: DC | PRN
  Administered 2023-03-12: 500 mL

## 2023-03-12 MED ORDER — LIDOCAINE HCL (PF) 1 % IJ SOLN
INTRAMUSCULAR | Status: DC | PRN
  Administered 2023-03-12: 5 mL via INTRADERMAL

## 2023-03-12 MED ORDER — MIDAZOLAM HCL 2 MG/2ML IJ SOLN
INTRAMUSCULAR | Status: AC
  Filled 2023-03-12: qty 2

## 2023-03-12 MED ORDER — FENTANYL CITRATE (PF) 100 MCG/2ML IJ SOLN
INTRAMUSCULAR | Status: DC | PRN
  Administered 2023-03-12: 25 ug via INTRAVENOUS

## 2023-03-12 MED ORDER — NOREPINEPHRINE 4 MG/250ML-% IV SOLN
INTRAVENOUS | Status: AC
  Filled 2023-03-12: qty 250

## 2023-03-12 MED ORDER — CLEVIDIPINE BUTYRATE 0.5 MG/ML IV EMUL
INTRAVENOUS | Status: AC
  Filled 2023-03-12: qty 50

## 2023-03-12 MED ORDER — ACETAMINOPHEN 10 MG/ML IV SOLN
INTRAVENOUS | Status: AC
  Filled 2023-03-12: qty 100

## 2023-03-12 MED ORDER — FENTANYL CITRATE (PF) 100 MCG/2ML IJ SOLN
INTRAMUSCULAR | Status: AC
  Filled 2023-03-12: qty 2

## 2023-03-12 MED ORDER — PHENYLEPHRINE HCL-NACL 20-0.9 MG/250ML-% IV SOLN
INTRAVENOUS | Status: AC
  Filled 2023-03-12: qty 250

## 2023-03-12 MED ORDER — IODIXANOL 320 MG/ML IV SOLN
INTRAVENOUS | Status: DC | PRN
  Administered 2023-03-12: 8 mL

## 2023-03-12 MED ORDER — LIDOCAINE HCL (PF) 1 % IJ SOLN
INTRAMUSCULAR | Status: AC
  Filled 2023-03-12: qty 30

## 2023-03-12 SURGICAL SUPPLY — 8 items
BALLN MUSTANG 8.0X40 75 (BALLOONS) ×2 IMPLANT
BALLOON MUSTANG 8.0X40 75 (BALLOONS) IMPLANT
COVER DOME SNAP 22 D (MISCELLANEOUS) ×2 IMPLANT
GUIDEWIRE ANGLED .035X150CM (WIRE) IMPLANT
SHEATH PINNACLE R/O II 6F 4CM (SHEATH) IMPLANT
SYR MEDALLION 10ML (SYRINGE) IMPLANT
TRAY PV CATH (CUSTOM PROCEDURE TRAY) ×2 IMPLANT
WIRE MICRO SET SILHO 5FR 7 (SHEATH) IMPLANT

## 2023-03-12 NOTE — Discharge Instructions (Signed)

## 2023-03-12 NOTE — Op Note (Signed)
Patient presents with decreased access flows and prolonged cannulation site bleeding from her left upper arm straight graft placed  in 2023. On exam the graft has high pitched bruits in the mid AVG.    Summary:  1) Successful angiogram of a right upper arm straight AVG   with evidence of 2 distinct  50% stenoses in the mid AVG treated to 10% with a 8 mm Mustang FE ~18 atm.   2) The VA, central veins, and inflow were widely patent. 3) This left  upper arm straight graft remains amenable to future percutaneous intervention.  Description of procedure: The left upper arm was prepped and draped in the usual fashion. The left upper arm straight AVG was cannulated ([id number]) in the arterial limb of the graft in an antegrade direction with an 21G minipuncture needle and then a 6 Fr sheath was inserted by guidewire exchange technique. The angiogram revealed 2 50% stenosis in the mid body of the AVG with a patent venous anastomosis. The left central veins and inflow were patent.  A guidewire was easily advanced past the outflow stenosis and parked in the central veins. I then advanced an 8 x 4 Mustang angioplasty balloon through the antegrade sheath over the guidewire to the level of the more central stenosis first then the more distal stenosis. Venous angioplasty was performed at each location to 100% balloon effacement with approximately 16ATM of pressure via a hand syringe assembly.    Final arteriogram and completion venogram revealed no evidence of extravasation or dissection, more rapid access flows through the graft and 10% residual stenosis at the location of angioplasty.  Hemostasis: A 3-0 ethilon purse string suture was placed at the cannulation site on removal of the sheath.  Sedation: 1mg  Versed, Fentanyl.  Sedation time: 8 minutes  Contrast. 7 mL  Monitoring: Because of the patient's comorbid conditions and sedation during the procedure, continuous EKG monitoring and O2 saturation  monitoring was performed throughout the procedure by the RN. There were no abnormal arrhythmias encountered.  Complications: None.   Diagnoses: I87.1 Stricture of vein  N18.6 ESRD T82.858A Stricture of access  Procedure Coding:  [phone] Cannulation and angiogram of fistula, venous angioplasty (AVG VA) Q6578 Contrast  Recommendations:  1. Continue to cannulate the fistula with 15G needles.  2. Refer back for problems with flows. 3. Remove the suture next treatment.   Discharge: The patient was discharged home in stable condition. The patient was given education regarding the care of the dialysis access AVF and specific instructions in case of any problems.

## 2023-03-12 NOTE — H&P (Signed)
[facility]  INPATIENT CONSULTATION  Reason for Consultation: low access flows  Requesting Provider: Dr. Arrie Aran  HPI: Mackenzie [name] is an 73 y.o. female with ESRD on HD, atrial fibrillation, DM, HTN, HL who presents for low AF.   Her LUE AVG was placed by Dr. Arbie Cookey in 2023 and recently the access flows have severe declined.  She notes occasional aspiration of clots and had 1 infiltration recently.  She is NPO, no h/o contrast allergy and is not driving today.   PMH: Past Medical History:  Diagnosis Date   Arthritis    Atrial fibrillation (HCC)    CHF (congestive heart failure) (HCC)    DM2 (diabetes mellitus, type 2) (HCC) 06/24/2021   HLD (hyperlipidemia)    Hypertension    PSH: Past Surgical History:  Procedure Laterality Date   AV FISTULA PLACEMENT Left 11/06/2021   Procedure: INSERTION OF LEFT ARM ARTERIOVENOUS (AV) GORE-TEX GRAFT;  Surgeon: Larina Earthly, MD;  Location: [facility] ORS;  Service: Vascular;  Laterality: Left;   COLONOSCOPY WITH PROPOFOL N/A 01/19/2021   hemorrhoids on perianal exam, examined portion ofileum normal, entire examined colon, normal. 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 no specimens   GIVENS CAPSULE STUDY N/A 02/02/2021   Procedure: GIVENS CAPSULE STUDY;  Surgeon: Dolores Frame, MD;  Location: [facility] ENDO SUITE;  Service: Gastroenterology;  Laterality: N/A;  7:30   IR FLUORO GUIDE CV LINE RIGHT  06/26/2021   IR FLUORO GUIDE CV LINE RIGHT  07/02/2021   IR US GUIDE VASC ACCESS RIGHT  06/26/2021   IR US GUIDE VASC ACCESS RIGHT  07/02/2021   LEFT HEART CATH AND CORONARY ANGIOGRAPHY Right 05/06/2016   Procedure: Left Heart Cath and Coronary Angiography;  Surgeon: Laurier Nancy, MD;   Location: [facility] INVASIVE CV LAB;  Service: Cardiovascular;  Laterality: Right;     Past Medical History:  Diagnosis Date   Arthritis    Atrial fibrillation (HCC)    CHF (congestive heart failure) (HCC)    DM2 (diabetes mellitus, type 2) (HCC) 06/24/2021   HLD (hyperlipidemia)    Hypertension     Medications:  I have reviewed the patient's current medications.  Medications Prior to Admission  Medication Sig Dispense Refill   acetaminophen (TYLENOL) 500 MG tablet Take 500 mg by mouth every 6 (six) hours as needed for moderate pain (pain score 4-6).     albuterol (PROVENTIL HFA;VENTOLIN HFA) 108 (90 Base) MCG/ACT inhaler Inhale 2 puffs into the lungs daily as needed for wheezing or shortness of breath. 1 Inhaler 5   atorvastatin (LIPITOR) 40 MG tablet Take 1 tablet (40 mg total) by mouth daily at 6 PM. 30 tablet 5   cyanocobalamin (VITAMIN B12) 1000 MCG tablet Take 1,000 mcg by mouth daily.     ELIQUIS 5 MG TABS tablet TAKE ONE TABLET BY MOUTH TWICE DAILY 60 tablet 2   midodrine (PROAMATINE) 5 MG tablet Take 1 tablet (5 mg total) by mouth 3 (three) times daily with meals. 90 tablet 0   pantoprazole (PROTONIX) 40 MG tablet Take 1 tablet (40 mg total) by mouth daily. 30 tablet 1   VELPHORO 500 MG chewable tablet Chew 500 mg by mouth 2 (two) times daily with a meal.      ALLERGIES:  No Known  Allergies  FAM HX: Family History  Problem Relation Age of Onset   Diabetes Mother    Heart disease Mother    Heart attack Father    Diabetes Brother    Heart disease Maternal Aunt    Breast cancer Maternal Aunt    Heart disease Maternal Uncle    Breast cancer Maternal Grandmother    Colon cancer Neg Hx    Colon polyps Neg Hx     Social History:   reports that she has never smoked. She has never used smokeless tobacco. She reports that she does not drink alcohol and does not use drugs.  ROS: 12 system relevant ROS neg except per HPI above  Blood pressure (!) 126/52, pulse 66, SpO2  93%. PHYSICAL EXAM: Gen: well appearing woman  Eyes: anicteric, glasses ENT: class 2 airway, MMM Neck: supple CV:  a flutter on monitor, occ PVC Back: clear on RA Extr:  no edema, LUE AVG +bruit high pitched and weak thrill Neuro: nonfocal    No results found for this or any previous visit (from the past 48 hours).  No results found.  Assessment/PlanRuby P [name] is an 73 y.o. female with ESRD on HD, atrial fibrillation, DM, HTN, HL who presents for low AF.   **ESRD with HD access dysfunction:  AF dipped sharply on last check and a few clinic issues recently.  Agreeable to proceed to diagnostic angiogram with planned angioplasty if stenosis identified with conscious sedation.    Tyler Pita 03/12/2023, 10:14 AM

## 2023-03-13 ENCOUNTER — Encounter (HOSPITAL_COMMUNITY): Payer: Self-pay | Admitting: Internal Medicine

## 2023-03-15 IMAGING — DX DG CHEST 1V PORT
1 series · 1 of 1 positions shown · non-contrast
Comparison: 06/25/2021

CLINICAL DATA: Shortness of breath.

EXAM:
PORTABLE CHEST 1 VIEW

[chest ap]
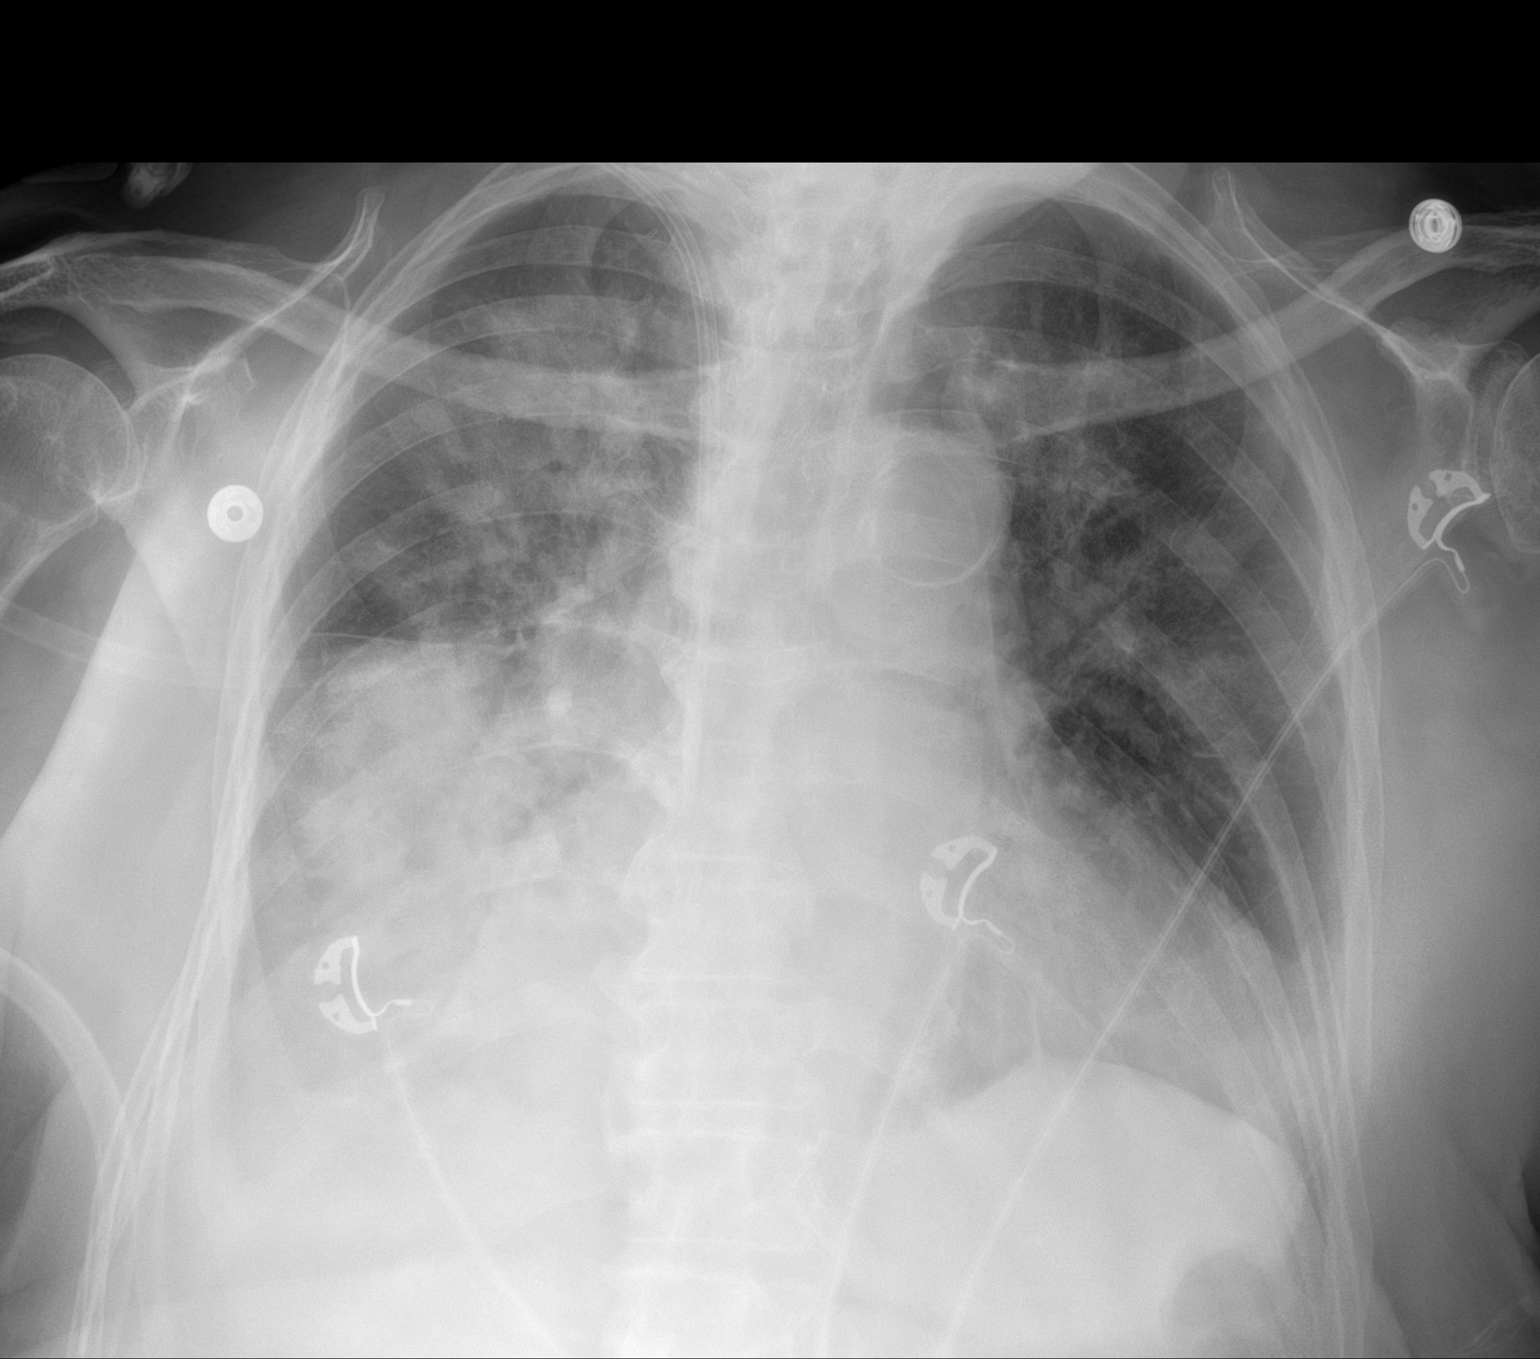

[1 of 1 positions shown; findings below may reference images not displayed]

FINDINGS: Stable cardiac enlargement.

New right IJ dialysis catheter in good position without complicating
features.

Worsening bilateral pulmonary infiltrates most notably in the right
upper lobe and right lower lobe. Associated right pleural effusion
appears larger. No pneumothorax.
IMPRESSION: 1. New right IJ dialysis catheter in good position.
2. Worsening bilateral pulmonary infiltrates and enlarging right
pleural effusion.

## 2023-03-20 IMAGING — XA IR FLUORO GUIDE CV LINE*R*
1 series · 1 of 1 positions shown · non-contrast
Comparison: none

INDICATION: 70-year-old female with history of renal failure requiring tunneled
hemodialysis access.

[Series 300: ir tunneled central venous catheter plac · portal-venous · 1 of 1 slices shown]
[im 1/1]
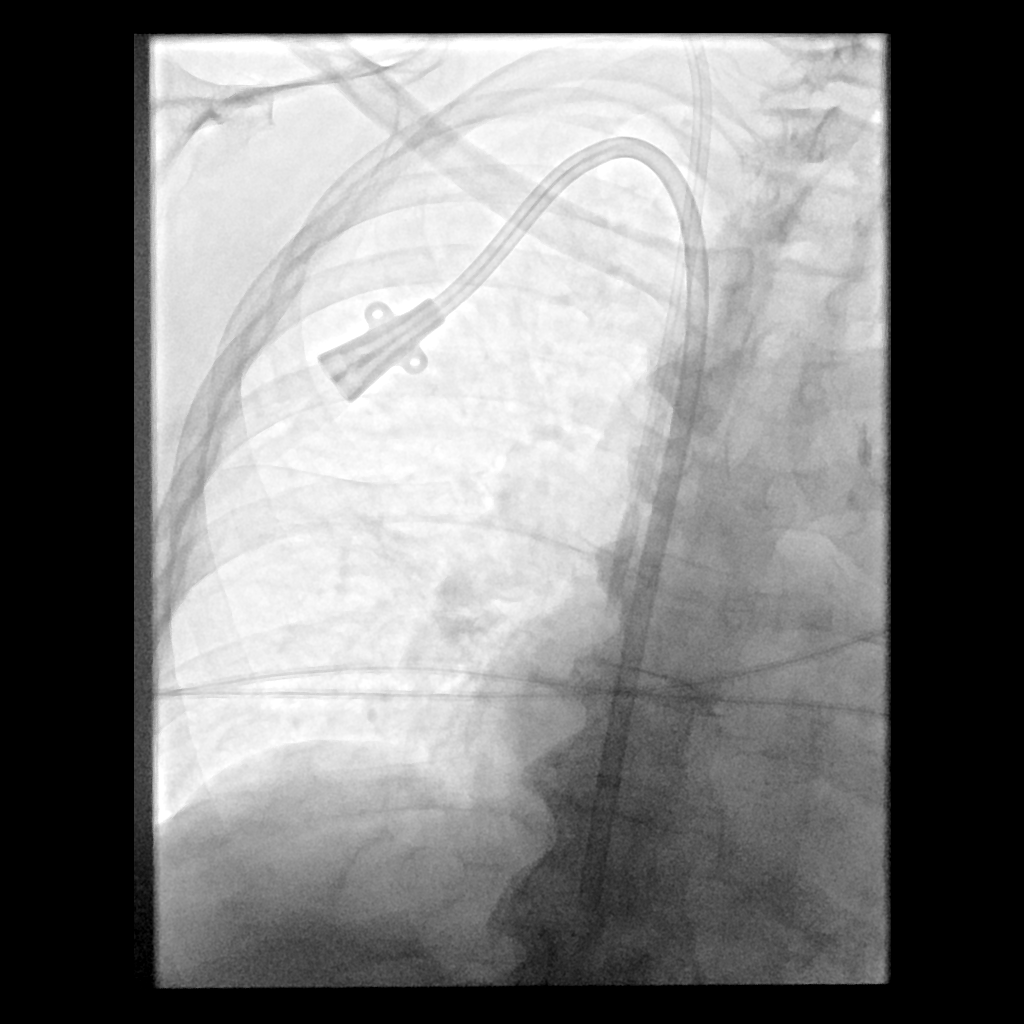

[1 of 1 positions shown; findings below may reference images not displayed]

EXAM:
TUNNELED CENTRAL VENOUS HEMODIALYSIS CATHETER PLACEMENT WITH
ULTRASOUND AND FLUOROSCOPIC GUIDANCE

MEDICATIONS:
Ancef 2 gm IV . The antibiotic was given in an appropriate time
interval prior to skin puncture.

ANESTHESIA/SEDATION:
Moderate (conscious) sedation was employed during this procedure. A
total of Versed 1 mg and Fentanyl 25 mcg was administered
intravenously.

Moderate Sedation Time: 20 minutes. The patient's level of
consciousness and vital signs were monitored continuously by
radiology nursing throughout the procedure under my direct
supervision.

FLUOROSCOPY TIME:  One minutes 12 seconds (16 mGy).

COMPLICATIONS:
None immediate.



After creating a small venotomy incision, a 21 gauge micropuncture
kit was utilized to access the internal jugular vein. Real-time
ultrasound guidance was utilized for vascular access including the
acquisition of a permanent ultrasound image documenting patency of
the accessed vessel.

A Rosen wire was advanced to the level of the IVC and the
micropuncture sheath was exchanged for an 8 Fr dilator. A
French tunneled hemodialysis catheter measuring 19 cm from tip to
cuff was tunneled in a retrograde fashion from the anterior chest
wall to the venotomy incision. Serial dilation was then performed an
a peel-away sheath was placed.

The catheter was then placed through the peel-away sheath with the
catheter tip ultimately positioned within the right atrium. Final
catheter positioning was confirmed and documented with a spot
radiographic image. The catheter aspirates and flushes normally. The
catheter was flushed with appropriate volume heparin dwells.

The catheter exit site was secured with a 0-Silk retention suture.
The venotomy incision was closed with Dermabond. Sterile dressings
were applied. The patient tolerated the procedure well without
immediate post procedural complication.
IMPRESSION: Successful placement of 19 cm tip to cuff tunneled hemodialysis
catheter via the right internal jugular vein with catheter tip
terminating within the right atrium. The catheter is ready for
immediate use.

## 2023-04-16 ENCOUNTER — Institutional Professional Consult (permissible substitution): Payer: 59 | Admitting: Cardiology

## 2023-04-21 ENCOUNTER — Other Ambulatory Visit: Payer: Self-pay | Admitting: Cardiovascular Disease

## 2023-04-25 ENCOUNTER — Encounter (HOSPITAL_COMMUNITY): Payer: Self-pay | Admitting: Internal Medicine

## 2023-05-12 ENCOUNTER — Encounter: Payer: Self-pay | Admitting: Internal Medicine

## 2023-05-13 NOTE — Progress Notes (Unsigned)
  Electrophysiology Office Note:    Date:  05/14/2023   ID:  Mackenzie [name], Mackenzie [name] 1950/09/12, MRN [id number]  [facility] Cardiologist:  Debbe Odea, MD  [facility] Electrophysiologist:  Lanier Prude, MD   Referring MD: Smith Robert, MD   Chief Complaint: Atrial flutter  History of Present Illness:    Mackenzie [name] is a 73 year old woman who I am seeing today for an evaluation of atrial flutter at the request of Dr. Azucena Cecil.  The patient has a history of atrial fibrillation and flutter on Eliquis.  Her medical history also includes end-stage renal disease on hemodialysis, hyperlipidemia.  She last saw Dr. Azucena Cecil October 14, 2022.  She is being worked up for possible renal transplant at [facility].  She is on Eliquis for stroke prophylaxis.  Today she tells me that she has had this "forever".  She is asymptomatic.  She confirms she has not worked up for renal transplant [facility].    Their past medical, social and family history was reviewed.   ROS:   Please see the history of present illness.    All other systems reviewed and are negative.  EKGs/Labs/Other Studies Reviewed:    The following studies were reviewed today:  November 12, 2022 echo EF 60-65 RV normal Severely dilated left atrium Dilated right atrium Mild MR Mild AI Mild to moderate TR  March 03, 2023 EKG shows atrial flutter, atypical appearing.  Left bundle branch block.  Ventricular rate 61 bpm.  January 02, 2021 EKG shows sinus rhythm.  Left bundle branch block.        Physical Exam:    VS:  BP 127/64   Pulse 67   Ht 5\' 7"  (1.702 m)   Wt 146 lb (66.2 kg)   SpO2 96%   BMI 22.87 kg/m     Wt Readings from Last 3 Encounters:  05/14/23 146 lb (66.2 kg)  03/03/23 150 lb 6 oz (68.2 kg)  11/18/22 143 lb 3.2 oz (65 kg)     GEN: no distress.  Using a rollator CARD: RRR, No MRG RESP: No IWOB. CTAB.        ASSESSMENT AND PLAN:    1. Atrial flutter, unspecified type  (HCC)      #Atrial flutter Seems to have been present for several years.  Asymptomatic.  Rate controlled.  Associated with a normal ejection fraction.  I do not think she is a good candidate for catheter ablation given her end-stage renal disease.  Recommend continuing with a rate control strategy.  Continue Eliquis for stroke prophylaxis.   Follow-up with EP on an as-needed basis     Signed, Rossie Muskrat. Lalla Brothers, MD, [facility], [facility] 05/14/2023 8:24 AM    Electrophysiology [facility]

## 2023-05-14 ENCOUNTER — Ambulatory Visit: Payer: 59 | Attending: Cardiology | Admitting: Cardiology

## 2023-05-14 VITALS — BP 127/64 | HR 67 | Ht 67.0 in | Wt 146.0 lb

## 2023-05-14 DIAGNOSIS — I4892 Unspecified atrial flutter: Secondary | ICD-10-CM | POA: Diagnosis not present

## 2023-05-14 NOTE — Patient Instructions (Signed)
 Medication Instructions:  Your physician recommends that you continue on your current medications as directed. Please refer to the Current Medication list given to you today.  *If you need a refill on your cardiac medications before your next appointment, please call your pharmacy*  Follow-Up: At [facility], you and your health needs are our priority.  As part of our continuing mission to provide you with exceptional heart care, we have created designated Provider Care Teams.  These Care Teams include your primary Cardiologist (physician) and Advanced Practice Providers (APPs -  Physician Assistants and Nurse Practitioners) who all work together to provide you with the care you need, when you need it.  Your next appointment:   As needed with Dr. Lalla Brothers

## 2023-08-04 ENCOUNTER — Ambulatory Visit (HOSPITAL_COMMUNITY)
Admission: RE | Admit: 2023-08-04 | Discharge: 2023-08-04 | Disposition: A | Discharge status: Home / Self Care | Attending: Vascular Surgery | Admitting: Vascular Surgery

## 2023-08-04 ENCOUNTER — Other Ambulatory Visit: Payer: Self-pay

## 2023-08-04 ENCOUNTER — Encounter (HOSPITAL_COMMUNITY)
Admission: RE | Disposition: A | Discharge status: Home / Self Care | Payer: Self-pay | Source: Home / Self Care | Attending: Vascular Surgery

## 2023-08-04 DIAGNOSIS — Y832 Surgical operation with anastomosis, bypass or graft as the cause of abnormal reaction of the patient, or of later complication, without mention of misadventure at the time of the procedure: Secondary | ICD-10-CM | POA: Insufficient documentation

## 2023-08-04 DIAGNOSIS — T829XXA Unspecified complication of cardiac and vascular prosthetic device, implant and graft, initial encounter: Secondary | ICD-10-CM | POA: Diagnosis not present

## 2023-08-04 DIAGNOSIS — N186 End stage renal disease: Secondary | ICD-10-CM | POA: Diagnosis not present

## 2023-08-04 DIAGNOSIS — Z992 Dependence on renal dialysis: Secondary | ICD-10-CM

## 2023-08-04 DIAGNOSIS — N179 Acute kidney failure, unspecified: Secondary | ICD-10-CM

## 2023-08-04 DIAGNOSIS — T82898A Other specified complication of vascular prosthetic devices, implants and grafts, initial encounter: Secondary | ICD-10-CM

## 2023-08-04 HISTORY — PX: A/V FISTULAGRAM: CATH118298

## 2023-08-04 SURGERY — A/V FISTULAGRAM
Anesthesia: LOCAL

## 2023-08-04 MED ORDER — HEPARIN (PORCINE) IN NACL 1000-0.9 UT/500ML-% IV SOLN
INTRAVENOUS | Status: DC | PRN
Start: 2023-08-04 — End: 2023-08-04
  Administered 2023-08-04: 500 mL

## 2023-08-04 MED ORDER — IODIXANOL 320 MG/ML IV SOLN
INTRAVENOUS | Status: DC | PRN
  Administered 2023-08-04: 9 mL via INTRAVENOUS

## 2023-08-04 MED ORDER — LIDOCAINE HCL (PF) 1 % IJ SOLN
INTRAMUSCULAR | Status: AC
Start: 2023-08-04 — End: 2023-08-04
  Filled 2023-08-04: qty 30

## 2023-08-04 MED ORDER — LIDOCAINE HCL (PF) 1 % IJ SOLN
INTRAMUSCULAR | Status: DC | PRN
  Administered 2023-08-04: 5 mL

## 2023-08-04 SURGICAL SUPPLY — 4 items
KIT PV (KITS) ×2 IMPLANT
SHEATH PROBE COVER 6X72 (BAG) ×1 IMPLANT
TRAY PV CATH (CUSTOM PROCEDURE TRAY) ×2 IMPLANT
TUBING CIL FLEX 10 FLL-RA (TUBING) ×1 IMPLANT

## 2023-08-04 NOTE — Op Note (Signed)
 DATE OF SERVICE: 08/04/2023  PATIENT:  Mackenzie [name]  73 y.o. female  PRE-OPERATIVE DIAGNOSIS:  ESRD  POST-OPERATIVE DIAGNOSIS:  Same  PROCEDURE:   1) left arm ultrasound guided arteriovenous graft access (CPT [phone]) 2) left arm fistulagram (CPT [phone])  SURGEON:  Surgeons and Role:    * Carlene Che, MD - Primary  ASSISTANT: none  ANESTHESIA:   local  EBL: minimal  BLOOD ADMINISTERED:none  DRAINS: none   LOCAL MEDICATIONS USED:  LIDOCAINE    SPECIMEN:  none  COUNTS: confirmed correct.  TOURNIQUET:  none  PATIENT DISPOSITION:  PACU - hemodynamically stable.   Delay start of Pharmacological VTE agent (>24hrs) due to surgical blood loss or risk of bleeding: no  INDICATION FOR PROCEDURE: Mackenzie [name] is a 73 y.o. female with ESRD dialyzing through a left arm arteriovenous graft. The graft is working well at dialysis. Her dialysis center is reporting low flows through the graft. Plan fistulagram today to evaluate. The patient understood and wished to proceed.  OPERATIVE FINDINGS:  No central venous stenosis No left subclavian vein stenosis No left axillary vein stenosis No issues in graft at venous anastomosis Graft with irregular contour consistent with chronic cannulation No issues in graft at arterial anastomosis Normal left brachial artery  DESCRIPTION OF PROCEDURE: After identification of the patient in the pre-operative holding area, the patient was transferred to the operating room. The patient was positioned supine on the operating room table. Anesthesia was induced. The left arm was prepped and draped in standard fashion. A surgical pause was performed confirming correct patient, procedure, and operative location.  Using ultrasound guidance, the left arm brachial-axillary arteriovenous graft was accessed with micropuncture technique. Access was upsized to a microsheath. Fistulagram was performed in stations. See above for details. All endovascular equipment  was removed. A figure-of-eight stitch was applied to the access with good hemostasis.  Upon completion of the case instrument and sharps counts were confirmed correct. The patient was transferred to the PACU in good condition. I was present for all portions of the procedure.  FOLLOW UP PLAN: no mechanical issues with graft. Follow up with me as needed.  Heber Little. Edgardo Goodwill, MD [facility] Phone Number: [phone] 08/04/2023 10:25 AM

## 2023-08-04 NOTE — Progress Notes (Signed)
 [facility] [place] PROGRESS NOTE  ASSESSMENT / PLAN: Mackenzie [name] is a 73 y.o. female with ESRD dialyzing through a left arm arteriovenous graft. The graft is working well at dialysis. Her dialysis center is reporting low flows through the graft. Plan fistulagram today to evaluate.   SUBJECTIVE: No complaints. Patient reports no issues at HD with graft. Some issues with cannulation, which she feels is staff dependent.  OBJECTIVE: BP (!) 151/53   Pulse 63   Temp 97.8 F (36.6 C) (Oral)   Resp 12   SpO2 98%  Elderly woman in no distress Regular rate and rhythm Unlabored breathing Left arm AVG with smooth thrill     Latest Ref Rng & Units 11/06/2021    7:41 AM 06/30/2021    1:25 AM 06/29/2021    2:16 AM  CBC  WBC 4.0 - 10.5 K/uL  8.6  11.7   Hemoglobin 12.0 - 15.0 g/dL 82.9  8.1  8.3   Hematocrit 36.0 - 46.0 % 43.0  27.0  26.2   Platelets 150 - 400 K/uL  232  209         Latest Ref Rng & Units 11/06/2021    8:15 AM 11/06/2021    7:41 AM 07/02/2021    1:09 AM  CMP  Glucose 70 - 99 mg/dL 85  81  562   BUN 8 - 23 mg/dL 36  50  43   Creatinine 0.44 - 1.00 mg/dL 1.30  8.65  7.84   Sodium 135 - 145 mmol/L 139  130  132   Potassium 3.5 - 5.1 mmol/L 5.1  6.9  3.7   Chloride 98 - 111 mmol/L 99  106  98   CO2 22 - 32 mmol/L 26   21   Calcium  8.9 - 10.3 mg/dL 8.8   7.7    Jakylah Bassinger N. Edgardo Goodwill, MD [facility] Phone Number: [phone] 08/04/2023 10:23 AM

## 2023-08-05 ENCOUNTER — Encounter (HOSPITAL_COMMUNITY): Payer: Self-pay | Admitting: Vascular Surgery

## 2023-08-18 NOTE — H&P (Signed)
 See same day progress note for details.  Debby SAILOR. Magda, MD [facility] Phone Number: [phone] 08/18/2023 12:40 PM

## 2023-08-20 NOTE — Progress Notes (Signed)
 PATIENT NAME: Mackenzie [name]  MR#: [id number]   DOB: [birth year]   [facility] CONFIDENTIAL SOCIAL WORK KIDNEY TRANSPLANT ASSESSMENT  **Evaluation Update**    DATE OF EVALUATION: 08/20/2023  DATE(S) OF PREVIOUS EVALUATION(S): 10/07/2022  INFORMANTS: Mackenzie [name], daughter/Mackenzie [name], granddaughter/Mackenzie [name] (age 73), granddaughter/Mackenzie [name] (age 100), granddaughter/Mackenzie [name] (age 80)  PREFERRED LANGUAGE: English   TRANSPLANT PHASE:  Evaluation  TRANSPLANT STATUS:  Active since 10/07/22  The limits of confidentiality and the purpose of the evaluation were reviewed. The patient was provided with a verbal description of the nature and purpose of the social work evaluation. I also reviewed the referral source, specific referral question for this evaluation, foreseeable risks/discomforts, benefits, limits of confidentiality, and mandatory reporting requirements of this provider. The patient was given the opportunity to ask questions and receive answers about the present evaluation. Oral consent was provided by the patient.   PRESENTING MEDICAL PROBLEMS AND RELEVANT HISTORY:  Mackenzie [name] is a 73 y.o. African American female who has a past medical history of ESRD, Arthritis and Hypertension.   Mackenzie [name] denied significant medical changes or hospitalizations since her last transplant SW evaluation in August 2024.  FUNCTIONAL STATUS:  Mackenzie [name] presents as independent with all personal ADLs, including bathing, dressing, cooking, and household chores. She ambulated with a wheelchair today, but reported that she typically ambulates with a cane. She also uses a walker when going to dialysis.   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 Watch TV.   UNDERSTANDING OF MEDICAL CONDITION AND TRANSPLANT:  Mackenzie [name] and her daughter/Mackenzie [name] attended the Transplant Orientation on 10/07/22 at the [facility] in [place], [place].  Patient verbalizes understanding that heart medicine and not taking my blood pressure medication caused her kidney disease. The patient reports learning about having CKD in 2023 when she had a kidney biopsy.  Pt understands medical illness process: Moderate Pt understands transplant process/psychosocial risks: Good  Education provided: orientation class information regarding transplant process and guidelines were reviewed, including psychosocial risks.  SUPPORT PLAN FOR TRANSPLANTATION:   Plan details: The patient plans to recover at home under the care of her daughter/Mackenzie [name] who will be her primary caregiver. Her daughter's girlfriend/Mackenzie [name] and daughter/Mackenzie [name] can provide back-up support.   The patient gave this CSW verbal consent to contact her daughter/Mackenzie Hayashida to discuss caregiving.  Primary Support/ Relationship to patient: daughter/Mackenzie [name] (not present today / [phone]) lives about 45 minutes from patient Age/DOB: 84 Understanding of medical directions: I do it now. 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  Support limitations: Employment/finances Support strengths: historical caregiver, access to reliable vehicle , comfortable driving to [facility], denies mobility/health concerns, and no other CG responsibilities   Back-up Support/ Relationship to patient: daughter's girlfriend/Mackenzie [name] (not present today / [phone]) lives about 45 minutes from the patient Age/DOB: 77 Understanding of medical directions: Unable to assess / not present Employment: Works FT as a Corporate treasurer limitations: none reported Support strengths: access to reliable vehicle, comfortable driving to [facility], denies mobility/health concerns, and no  other CG responsibilities   Back-up Support(s): daughter/Mackenzie [name] (present today / [phone]) lives with patient Age/DOB: 42 Understanding of medical directions: I feel good.  Employment: Not employed, not receiving SSI/SSDI Support limitations: transportation (does not drive) and other caregiving  responsibilities (3 children ages 12, 68, and 74 - sister-in-law will provide childcare as needed)  Support strengths: historical caregiver, denies mobility/health concerns, and lives in the home   Advance Directive: Education and form were provided. <no information>  DIALYSIS HISTORY:  Dialysis History      Start End Type Center Comments   10/20/2021  [facility]          Current Dialysis Center Information     [facility]     Phone: [phone] Fax: [phone]   Address: [address] [place] [address]                Patient runs for 4 hours on a T/H/S 1st shift schedule. Patient states that she has been compliant with all dialysis recommendations and provided verbal permission for this CSW to contact her dialysis center.    Transportation:  Self: Not currently driving Dialysis: [facility] or her daughter Distance from Home to [facility]: [address] [address] -- 35 minutes  Patient has adequate access to necessary environmental resources. She reports having access to 1 reliable vehicle.   Transportation Needs: No Transportation Needs (08/20/2023)   PRAPARE - Transportation   . Lack of Transportation (Medical): No   . Lack of Transportation (Non-Medical): No    ATTITUDE ABOUT TRANSPLANT: I feel good about it.  Expectations: Get off dialysis. Have more energy.  Fears/Concerns: denies  Patient is open to the idea of a Living Donor. She is not aware of potential donor(s).   SOCIAL HISTORY:  Citizenship Status: [place]  Copy History: Born in [place] Marital Status:  widowed 14+ years ago Lives with: Daughter/Mackenzie [name], daughter's fiance/Mackenzie [name], and 3 granddaughters: granddaughter/Mackenzie [name] (age 73), granddaughter/Mackenzie [name] (age 58), granddaughter/Mackenzie [name] (age 57); 2 pet dogs that live outside Children/Dependents: 2 daughters: daughter/Mackenzie Heider (age 65) and daughter/Mackenzie Vannatter (age 62) Social support: 2 daughters, grandchildren Housing: House is in good Psychologist, forensic  Housing: Low Risk  (10/07/2022)    Utilities: Low Risk  (10/07/2022)   Utilities   . Within the past 12 months, have you been unable to get utilities (heat, electricity) when it was really needed?: No    The patient reported using [employer] for financial assistance for electricity bills some.   COMPLIANCE HISTORY:  Problems obtaining medications or attending appointments (including transportation): denies, patient's daughter/Mackenzie [name] arranged for the patient to have transportation through [facility] to and from dialysis Problems organizing medications: Ms. Mackenzie Stefanski reported in August 2024 that the patient was mixing up medications before they started receiving prepackaged medications. She is able to independently take her medications from the pill packs. Ms. Constantine reported today that she continues to receive her medications in the pill packs and takes them independently. She reports good overall adherence with medications. Adherence to treatment/medications: Patient reports good adherence with medications, dialysis, and medical care.   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 History: no, spouse was in the [employer]   MEDICAL/PRESCRIPTION COVERAGE:  [employer] assistance: no Payer/Plan Subscriber Name Rel Member # Group #  [employer] MEDMARLICIA, SROKA PEA* Self [id number] [phone]     [address] [address]  MEDICAID [place] -  MEDICAICLARISSA, LAIRD PEA* Self [id number] T      [id number]   FINANCIAL RESOURCES:  Current income sources: SSA retirement, daughters contribute to  household bills when able, SNAP $46/month and U card ($300/month) Monthly expenses: House is paid off, utilities, car payment x1 (reliable x1), medication co-pays ($7/month), other customary bills Financial Risks and Debts: denies Current income meets basic needs: Yes  Financial Resource Strain: Medium Risk (08/20/2023)   Overall Financial Resource Strain (CARDIA)   . Difficulty of Paying Living Expenses: Somewhat hard    Food Insecurity: No Food Insecurity (08/20/2023)   Hunger Vital Sign   . Worried About Programme researcher, broadcasting/film/video in the Last Year: Never true   . Ran Out of Food in the Last Year: Never true    STRESSORS/COPING STYLE: The patient denies current stressors. She uses coping strategies such as talking about what's bothering her, watching TV, and spending time with the grandkids.  Religious/Spiritual Beliefs: denies  PSYCHIATRIC HISTORY:  Current issues/mood: denies, the patient reports her mood recently has been good.  Past issues: denies Medications: denies Therapy: denies SI/HI: denies Hospitalizations: denies Loss/Trauma/Violence: denies Mania: denies, patient reports her sleep is good Psychosis: denies Hx of adverse reactions to treatment/medication/steroids: denies PHQ-2 Score: 0 (10/07/2022), 0   GAD-2 Score: 0 (10/07/2022), 0   They agreed to discuss any changes in the patient's mood or behavior with the medical team.  MENTAL STATUS:  Affect: normal, mood congruent  Appearance: well groomed hair, well dressed, season and temperature appropriate Attention Span: normal attention span Attitude: friendly, cooperative Behavior: calm, cooperative, appropriate eye contact, no psychomotor agitation/retardation noted Insight & Judgement: intact/appropriate, reliable insight Level of Consciousness: alert Mood:  euthymic/normal/stable  Orientation: person, place, time, date Speech: slow speech, whispered/low volume speech  Thought Content: logical connections  COGNITIVE HISTORY & HEALTH LITERACY:  Ms. Leach does  seem cognitively intact. Although, throughout the interview she appeared to have difficulty recalling information and looked to her daughter to answer questions related to her medical history. She denies history of memory or cognitive concerns related to her health.  Mini-Cog Score: 0 (10/07/22), 2 (08/20/23)  Law[place]-Brody Instrumental Activities of Daily Living Scale (IADL): 5 (10/07/2022) - Not assessed today  Ms. Quilling was seen by transplant psychology on 05/02/23 and annual in-person psychology visits were recommended to monitor cognition, adherence, mood, and provide reiterative transplant education.   There were not concerns for health literacy.   Do you need to have someone help you when you read instructions, pamphlets, or other written material from your doctor or pharmacy? No. How often? N/a  Hx of special education or learning challenges: no history of educational challenges  SUBSTANCE HISTORY:  Tobacco: Ms. Barletta reports that she has never smoked. She reports that she has never used smokeless tobacco.   Alcohol: Ms. Kleckley denies current alcohol use. She reports social use of alcohol when she was a teenager, but stopped at age 15. She denied any history of legal/social consequences from alcohol use.  Illicit Substances: Ms. Winslett reports no history of drug use.   CHRONIC PAIN HISTORY (and referral needs): denies  LEGAL HISTORY:  Ms. Kiely denies current legal issues.  A review of [place] DPS Offender Public Information website revealed no history of criminal charges.  COLLATERAL CONTACT: Shataya Winkles was interviewed with her daughter/Mackenzie Spellman for the beginning of the interview and alone for more sensitive questions.  Called [facility] on 09/01/23  and spoke with RN/Laura. Dialysis staff denies concerns about consistent attendance to dialysis and response to changing medical directions.  CSW spoke with the patient's daughter/Mackenzie Kilman on 09/03/23 to discuss caregiving. Ms. Kadow  expressed continued commitment to the caregiving role. She does not receive accrued paid leave with her current employment and was unsure of FMLA eligibility, but reported that she would be able to take time off work as needed.  EDUCATION: KIDNEY Transplant Process Psychosocial risks of transplant, including depression, anxiety, guilt, PTSD CKD/ESRD can often cause stress on the entire family unit, not just the patient Advance Care Planning Long-term financial and vocational planning Expectations for support planning both pre- and post-transplant Transplant social worker availability throughout transplant process Proofreader and Healthcare Power of Attorney Forms Kidney Transplant Patients and their Support Persons  ASSESSMENT AND RECOMMENDATIONS: Cadi Rhinehart is a 73 y.o. female who has a past medical history of ESRD, Arthritis, and Hypertension. Ms. Chinchilla presents today for an update to her kidney transplant evaluation.  Ms. Armwood denied significant medical changes or hospitalizations since her last transplant social work evaluation in August 2024.   Patient was seen in-person at the [facility] in [place], [place]. This CSW wore a mask for safety re: COVID-19. She presents as friendly and cooperative, with a congruent mood and affect. Patient was noted to be neat and clean in appearance, and was found to be alert and oriented x3. Judgment and insight appear to be intact at this time.   Ms. Haglund continues to live in [place], [place] with her adult daughter and 3 minor grandchildren. Her daughter's fiance also lives with them now. They live in a house that she owns and reported is in good condition. Ms. Maske denied housing insecurity. Ms. Pester is  not currently working and receives [employer]. 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 Patient presents as independent with all personal ADL's including bathing, dressing, cooking, and household chores. Ms. Jamroz ambulated with a wheelchair today, but reported that she typically ambulates with a cane. She also uses a walker when going to dialysis. Patient wears glasses daily and is blind in her right eye. Patient has a number of psychosocial strengths, including motivation for transplant, denial of mental health concerns, adherence to medical treatment, and denial of substance abuse concerns. Patient has adequate access to necessary environmental resources. She reported having access to 1 reliable vehicle.   Ms. Klingbeil described increased motivation for transplant since her last transplant social work evaluation. She hopes to no longer need dialysis and have more energy. She described moderate understanding of her medical history, reasonable expectations for transplant, and good recall of transplant education. Patient has been dialysis dependent on [facility] since September 2023. Patient's dialysis RN denied concerns about dialysis treatment or transplant.  Ms. Paulick continues to need some assistance with her medications and receives them in pill packs. She reported good adherence with her medications in the pill packs. Ms. Toppins denied increased concerns about her memory. She scored a 2 on the Mini-Cog, which is an improved score from her last transplant social work visit. Ms. Zertuche was seen by transplant psychology on 05/02/23 and annual in-person psychology visits were recommended to monitor cognition, adherence, mood, and provide reiterative transplant education.   Ms. Cragin' caregiving plan has remained the same with adequate support. However, the patient has arrived to transplant appointments recently with only her minor grandchildren present such as a her surgery visit on 04/07/23 and psychology visit  on 05/02/23. CSW reiterated the importance of caregivers being consistently involved in the patient's transplant care both pre and post transplant with both of the patient's daughters. The patient plans to recover at home under the  care of her daughter/Mackenzie Beam who will be her primary caregiver. Her daughter's girlfriend/Mackenzie [name] and daughter/Mackenzie Morden can provide back-up support. Ms. Cahue' older daughter/Mackenzie Duffin was present for the interview today and asked a lot of questions about transplant in general as if she had not received much education yet. She does not drive so the patient will be relying on her younger daughter/Mackenzie Shimada and her daughter's significant other/Mackenzie Jones for transportation. Ms. Damiah Mcdonald also has 3 minor children, but she reported her sister-in-law can provide childcare as needed. CSW spoke with the patient's primary caregiver/Mackenzie [name] on 09/03/23 to discuss caregiving. Ms. Sneath expressed continued commitment to the caregiving role. She does not receive accrued paid leave with her current employment and was unsure of FMLA eligibility, but reported that she would be able to take time off work as needed. The patient will need a strong caregiving plan given cognitive concerns and need for assistance with medications.   Ms. Postema continues to screen as medium risk for financial resource strain. She denied food insecurity. Her income includes SSA retirement and [employer]. She lives with her daughter and her daughter's fiance who help with household expenses. Her other daughter also helps with expenses as needed. The patient reported using [employer] for financial assistance for electricity bills some. Ms. Brienza has active [employer] and Medicaid, which she will continue to be eligible for post-transplant. CSW encouraged the patient to fundraise to prepare for future medical expenses.   Overall, Ms. Theall appears to be a  minimally acceptable psychosocial candidate for kidney transplant with memory, finances, and caregiving support as identified psychosocial risk factors. CSW recommends continued annual in-person transplant social work follow-up to provide additional support and resources.  SIPAT SCORE: 22 PREVIOUS SIPAT SCORE(S): 21 (10/07/22)  Final decision regarding listing status is based upon committee review at selection meeting.  Before the patient would be a good candidate for transplant from a psychosocial perspective, it is recommended the patient:  1. n/a   The patient would also benefit from:  1. Fundraising to prepare for future medical expenses 2. Completing an Advance Directive/Living Will/HCPOA if desired and providing [facility] with a copy for electronic record 3. Following transplant psychology recommendations 4. CSW recommends annual in-person transplant social work follow-up to continue to monitor the patient's memory, finances, and caregiving support   CSW to provide continued support and encouragement.  Donny Ken, LCSW Transplant Social Worker [facility]

## 2023-08-25 ENCOUNTER — Encounter: Payer: Self-pay | Admitting: *Deleted

## 2023-09-01 ENCOUNTER — Ambulatory Visit: Attending: Cardiology | Admitting: Cardiology

## 2023-09-27 NOTE — Progress Notes (Deleted)
 Cardiology Clinic Note   Date: 09/27/2023 ID: Teliyah, Royal [birth year], MRN [id number]  Primary Cardiologist:  Redell Cave, MD  Chief Complaint   Mackenzie [name] is a 73 y.o. female who presents to the clinic today for ***  Patient Profile   Mackenzie [name] is followed by *** for the history outlined below.      Past medical history significant for: Nonobstructive CAD. LHC 05/06/2016 (NSTEMI): Proximal to mid RCA 30%.  Ostial OM1-OM1 40%.  Proximal LCx 40%.  Distal LAD 40%. Nuclear stress test 11/25/2022: Normal, low risk study.  No ST deviation noted.  No evidence of ischemia or infarction.  Study was very poor and suboptimal due to intense GI uptake interfering with the inferior wall.  CT attenuation images showed significant aortic and coronary calcifications. Chronic systolic heart failure with recovered LV function. Echo 11/11/2022: EF 60 to 65%.  No RWMA.  Indeterminate diastolic parameters.  Normal global strain.  Normal RV size/function.  Severe LAE.  Mild RAE.  Mild MR/AI.  Moderate MAC.  Mild to moderate TR.  Aortic valve calcification/sclerosis without stenosis. PAF/a-flutter. Onset March 2018 in the setting of influenza and NSTEMI. LBBB. Hypertension. T2DM. Hypothyroidism. ESRD. ANCA vasculitis. Hemodialysis TThSa.  In summary, patient presented to [facility] ED via EMS on 05/01/2016 with shortness of breath.  Initial SpO2 in the 70s, BP 219/140 per EMS.  She denies chest pain or lower extremity edema.  Patient admitted to medication nonadherence.  Initial labs: Sodium 139, potassium 3.2, creatinine 0.94, BUN 18, WBC 16.9, hemoglobin 13.2, BNP 367.  Troponin 0.19>> 3.51>> 3.52.  Positive for influenza B.  Echo showed EF 35 to 40%, regional wall motion abnormalities could not be excluded, Grade I DD, mild MR, mild LAE, mild RVH.  Left heart cath revealed nonobstructive CAD as detailed above.  During hospitalization patient developed A-fib with RVR.  Her rate was controlled  with Cardizem  and she was started on anticoagulation.  Patient was discharged on 05/07/2016 on GDMT for CAD and HFrEF.  Repeat echo November 2022 showed EF 60 to 65%.  Patient was first evaluated by Dr. Cave on 10/14/2022 to establish care at the request of Dr. Pecolia.  Patient was being considered for possible renal transplant and needed pretransplant workup with echo and stress testing.  Echo showed EF 60 to 65% as detailed above. Stress test was normal, low risk study. Patient was seen in follow up October 2024 after testing and found to be in aflutter. She was seen again for follow up in January 2025 and was pending visit with EP. She denied cardiac awareness of arhythmia. She remained in aflutter at that time.    Patient was last seen in the office by Dr. Cindie on 05/14/2023 for evaluation of a-flutter.  It was felt she was not a good candidate for catheter ablation secondary to ESRD.  Continue rate control was recommended.  She was instructed to follow-up with EP as needed.     History of Present Illness    Today, patient ***  Nonobstructive CAD LHC March 2018 for NSTEMI showed nonobstructive CAD in RCA, OM1, LCx, LAD.  Nuclear stress test October 2024 was a normal, low risk study.  Patient denies chest pain, pressure or tightness.*** -Continue atorvastatin .  Not on aspirin  secondary to Eliquis .   Chronic systolic heart failure with recovered LV function Echo September 2024 showed EF 60 to 65%, no RWMA, severe LAE, mild RAE, mild MR/AI.  Patient denies lower extremity edema, shortness  of breath, orthopnea or PND. Euvolemic and well compensated on exam.*** -GDMT limited secondary to ESRD and hypotension. -Volume managed by HD.   Hypotension BP today***. No reported dizziness.  -Continue midodrine .   PAF/a-flutter Onset March 2018 in the setting of influenza and NSTEMI.  Patient denies palpitations. Denies spontaneous bleeding concerns. EKG*** -Continue Eliquis . Appropriate Eliquis   dose.  ROS: All other systems reviewed and are otherwise negative except as noted in History of Present Illness.  EKGs/Labs Reviewed        No results found for requested labs within last 365 days.   No results found for requested labs within last 365 days.   No results found for requested labs within last 365 days.   No results found for requested labs within last 365 days.  ***  Risk Assessment/Calculations    {Does this patient have ATRIAL FIBRILLATION?[phone]} No BP recorded.  {Refresh Note OR Click here to enter BP  :1}***        Physical Exam    VS:  There were no vitals taken for this visit. , BMI There is no height or weight on file to calculate BMI.  GEN: Well nourished, well developed, in no acute distress. Neck: No JVD or carotid bruits. Cardiac: *** RRR. *** No murmur. No rubs or gallops.   Respiratory:  Respirations regular and unlabored. Clear to auscultation without rales, wheezing or rhonchi. GI: Soft, nontender, nondistended. Extremities: Radials/DP/PT 2+ and equal bilaterally. No clubbing or cyanosis. No edema ***  Skin: Warm and dry, no rash. Neuro: Strength intact.  Assessment & Plan   ***  Disposition: ***     {Are you ordering a CV Procedure (e.g. stress test, cath, DCCV, TEE, etc)?   Press F2        :[id number]}   Signed, Barnie HERO. Orvetta Danielski, DNP, NP-C

## 2023-09-29 ENCOUNTER — Ambulatory Visit: Admitting: Student

## 2023-10-14 NOTE — Progress Notes (Unsigned)
 Cardiology Clinic Note   Date: 10/17/2023 ID: [name], [name] Feb 07, 1951, MRN [id number]  Primary Cardiologist:  Redell Cave, MD  Chief Complaint   Mackenzie [name] is a 73 y.o. female who presents to the clinic today for routine follow up.   Patient Profile   Mackenzie [name] is followed by Dr. Cave for the history outlined below.      Past medical history significant for: Nonobstructive CAD. LHC 05/06/2016 (NSTEMI): Proximal to mid RCA 30%.  Ostial OM1-OM1 40%.  Proximal LCx 40%.  Distal LAD 40%. Nuclear stress test 11/25/2022: Normal, low risk study.  No ST deviation noted.  No evidence of ischemia or infarction.  Study was very poor and suboptimal due to intense GI uptake interfering with the inferior wall.  CT attenuation images showed significant aortic and coronary calcifications. Chronic systolic heart failure with recovered LV function. Echo 11/11/2022: EF 60 to 65%.  No RWMA.  Indeterminate diastolic parameters.  Normal global strain.  Normal RV size/function.  Severe LAE.  Mild RAE.  Mild MR/AI.  Moderate MAC.  Mild to moderate TR.  Aortic valve calcification/sclerosis without stenosis. PAF/a-flutter. Onset March 2018 in the setting of influenza and NSTEMI. LBBB. Hypertension. T2DM. Hypothyroidism. ESRD. ANCA vasculitis. Hemodialysis TThSa.  In summary, patient presented to [facility] ED via EMS on 05/01/2016 with shortness of breath.  Initial SpO2 in the 70s, BP 219/140 per EMS.  She denies chest pain or lower extremity edema.  Patient admitted to medication nonadherence.  Initial labs: Sodium 139, potassium 3.2, creatinine 0.94, BUN 18, WBC 16.9, hemoglobin 13.2, BNP 367.  Troponin 0.19>> 3.51>> 3.52.  Positive for influenza B.  Echo showed EF 35 to 40%, regional wall motion abnormalities could not be excluded, Grade I DD, mild MR, mild LAE, mild RVH.  Left heart cath revealed nonobstructive CAD as detailed above.  During hospitalization patient developed A-fib with  RVR.  Her rate was controlled with Cardizem  and she was started on anticoagulation.  Patient was discharged on 05/07/2016 on GDMT for CAD and HFrEF.  Repeat echo November 2022 showed EF 60 to 65%.  Patient was first evaluated by Dr. Cave on 10/14/2022 to establish care at the request of Dr. Pecolia.  Patient was being considered for possible renal transplant and needed pretransplant workup with echo and stress testing.  Echo showed EF 60 to 65% as detailed above. Stress test was normal, low risk study. Patient was seen in follow up October 2024 after testing and found to be in aflutter. She was seen again for follow up in January 2025 and was pending visit with EP. She denied cardiac awareness of arhythmia. She remained in aflutter at that time.    Patient was last seen in the office by Dr. Cindie on 05/14/2023 for evaluation of a-flutter.  It was felt she was not a good candidate for catheter ablation secondary to ESRD.  Continue rate control was recommended.  She was instructed to follow-up with EP as needed.     History of Present Illness    Today, patient is accompanied by her daughter and another family member. She is doing very well. Patient denies shortness of breath, dyspnea on exertion, lower extremity edema, orthopnea or PND. No chest pain, pressure, or tightness. No palpitations. She is tolerating midodrine  well and has not noted a drop in BP while at dialysis. She is eating well.     ROS: All other systems reviewed and are otherwise negative except as noted in History of  Present Illness.  EKGs/Labs Reviewed    EKG Interpretation Date/Time:  Friday October 17 2023 12:10:29 EDT Ventricular Rate:  74 PR Interval:  368 QRS Duration:  166 QT Interval:  490 QTC Calculation: 543 R Axis:   -47  Text Interpretation: Sinus rhythm with wenkebach Left axis deviation Left bundle branch block Confirmed by Loistine Sober [phone]) on 10/17/2023 1:28:56 PM    Risk Assessment/Calculations      CHA2DS2-VASc Score = 6   This indicates a 9.7% annual risk of stroke. The patient's score is based upon: CHF History: 1 HTN History: 1 Diabetes History: 1 Stroke History: 0 Vascular Disease History: 1 Age Score: 1 Gender Score: 1             Physical Exam    VS:  BP (!) 120/50 (BP Location: Right Arm, Patient Position: Sitting, Cuff Size: Normal)   Pulse 76   Ht 5' 6 (1.676 m)   Wt 145 lb (65.8 kg)   SpO2 98%   BMI 23.40 kg/m  , BMI Body mass index is 23.4 kg/m.  GEN: Well nourished, well developed, in no acute distress. Neck: No JVD or carotid bruits. Cardiac:  RRR.  No murmur. No rubs or gallops.   Respiratory:  Respirations regular and unlabored. Clear to auscultation without rales, wheezing or rhonchi. GI: Soft, nontender, nondistended. Extremities: Radials/DP/PT 2+ and equal bilaterally. No clubbing or cyanosis. No edema.  Skin: Warm and dry, no rash. Neuro: Strength intact.  Assessment & Plan   Nonobstructive CAD LHC March 2018 for NSTEMI showed nonobstructive CAD in RCA, OM1, LCx, LAD.  Nuclear stress test October 2024 was a normal, low risk study.  Patient denies chest pain, pressure or tightness. -Continue atorvastatin .  Not on aspirin  secondary to Eliquis .   Chronic systolic heart failure with recovered LV function Echo September 2024 showed EF 60 to 65%, no RWMA, severe LAE, mild RAE, mild MR/AI.  Patient denies lower extremity edema, shortness of breath, orthopnea or PND. Euvolemic and well compensated on exam. -GDMT limited secondary to ESRD and hypotension. -Volume managed by HD.   Hypotension BP today 120/50. No reported dizziness. She is not having drop in BP at dialysis.  -Continue midodrine .   PAF/a-flutter Onset March 2018 in the setting of influenza and NSTEMI.  Patient denies palpitations. Denies spontaneous bleeding concerns. EKG demonstrates sinus rhythm with Wenckebach 74 bpm.  -Continue Eliquis . Appropriate Eliquis   dose.  Disposition: Return in 6 months or sooner as needed.          Signed, Sober HERO. Zyere Jiminez, DNP, NP-C

## 2023-10-17 ENCOUNTER — Ambulatory Visit: Attending: Student | Admitting: Student

## 2023-10-17 ENCOUNTER — Encounter: Payer: Self-pay | Admitting: Student

## 2023-10-17 VITALS — BP 120/50 | HR 76 | Ht 66.0 in | Wt 145.0 lb

## 2023-10-17 DIAGNOSIS — I441 Atrioventricular block, second degree: Secondary | ICD-10-CM

## 2023-10-17 DIAGNOSIS — I5032 Chronic diastolic (congestive) heart failure: Secondary | ICD-10-CM

## 2023-10-17 DIAGNOSIS — I2510 Atherosclerotic heart disease of native coronary artery without angina pectoris: Secondary | ICD-10-CM

## 2023-10-17 DIAGNOSIS — I480 Paroxysmal atrial fibrillation: Secondary | ICD-10-CM | POA: Diagnosis not present

## 2023-10-17 DIAGNOSIS — I4892 Unspecified atrial flutter: Secondary | ICD-10-CM | POA: Diagnosis not present

## 2023-10-17 DIAGNOSIS — I959 Hypotension, unspecified: Secondary | ICD-10-CM

## 2023-10-17 NOTE — Patient Instructions (Signed)
 Medication Instructions:  Your physician recommends that you continue on your current medications as directed. Please refer to the Current Medication list given to you today.   *If you need a refill on your cardiac medications before your next appointment, please call your pharmacy*  Lab Work: None ordered at this time  If you have labs (blood work) drawn today and your tests are completely normal, you will receive your results only by: MyChart Message (if you have MyChart) OR A paper copy in the mail If you have any lab test that is abnormal or we need to change your treatment, we will call you to review the results.  Testing/Procedures: None ordered at this time   Follow-Up: At [facility], you and your health needs are our priority.  As part of our continuing mission to provide you with exceptional heart care, our providers are all part of one team.  This team includes your primary Cardiologist (physician) and Advanced Practice Providers or APPs (Physician Assistants and Nurse Practitioners) who all work together to provide you with the care you need, when you need it.  Your next appointment:   6 month(s)  Provider:   Redell Cave, MD or Barnie Hila, NP    We recommend signing up for the patient portal called MyChart.  Sign up information is provided on this After Visit Summary.  MyChart is used to connect with patients for Virtual Visits (Telemedicine).  Patients are able to view lab/test results, encounter notes, upcoming appointments, etc.  Non-urgent messages can be sent to your provider as well.   To learn more about what you can do with MyChart, go to [email or web address].
